# Patient Record
Sex: Male | Born: 1955 | Race: White | Hispanic: No | Marital: Married | Smoking: Never smoker
Health system: Southern US, Community
[De-identification: ages and names within clinical notes are randomized; demographics above are authoritative.]

## PROBLEM LIST (undated history)

## (undated) DIAGNOSIS — IMO0002 Reserved for concepts with insufficient information to code with codable children: Secondary | ICD-10-CM

## (undated) DIAGNOSIS — F329 Major depressive disorder, single episode, unspecified: Secondary | ICD-10-CM

## (undated) DIAGNOSIS — G8929 Other chronic pain: Secondary | ICD-10-CM

## (undated) DIAGNOSIS — F32A Depression, unspecified: Secondary | ICD-10-CM

## (undated) DIAGNOSIS — G4730 Sleep apnea, unspecified: Secondary | ICD-10-CM

## (undated) DIAGNOSIS — E785 Hyperlipidemia, unspecified: Secondary | ICD-10-CM

## (undated) DIAGNOSIS — Z9689 Presence of other specified functional implants: Secondary | ICD-10-CM

## (undated) DIAGNOSIS — H269 Unspecified cataract: Secondary | ICD-10-CM

## (undated) DIAGNOSIS — Z9889 Other specified postprocedural states: Secondary | ICD-10-CM

## (undated) DIAGNOSIS — I10 Essential (primary) hypertension: Secondary | ICD-10-CM

## (undated) DIAGNOSIS — G5603 Carpal tunnel syndrome, bilateral upper limbs: Secondary | ICD-10-CM

## (undated) DIAGNOSIS — K219 Gastro-esophageal reflux disease without esophagitis: Secondary | ICD-10-CM

## (undated) DIAGNOSIS — F419 Anxiety disorder, unspecified: Secondary | ICD-10-CM

## (undated) DIAGNOSIS — M549 Dorsalgia, unspecified: Secondary | ICD-10-CM

## (undated) HISTORY — DX: Gastro-esophageal reflux disease without esophagitis: K21.9

## (undated) HISTORY — DX: Dorsalgia, unspecified: M54.9

## (undated) HISTORY — PX: COLONOSCOPY: SHX174

## (undated) HISTORY — PX: NASAL SEPTUM SURGERY: SHX37

## (undated) HISTORY — PX: KNEE ARTHROSCOPY: SUR90

## (undated) HISTORY — DX: Other chronic pain: G89.29

## (undated) HISTORY — PX: NISSEN FUNDOPLICATION: SHX2091

## (undated) HISTORY — PX: UPPER GASTROINTESTINAL ENDOSCOPY: SHX188

## (undated) HISTORY — DX: Hyperlipidemia, unspecified: E78.5

## (undated) HISTORY — DX: Carpal tunnel syndrome, bilateral upper limbs: G56.03

## (undated) HISTORY — DX: Anxiety disorder, unspecified: F41.9

## (undated) HISTORY — DX: Sleep apnea, unspecified: G47.30

## (undated) HISTORY — PX: ELBOW SURGERY: SHX618

## (undated) HISTORY — DX: Essential (primary) hypertension: I10

## (undated) HISTORY — PX: STOMACH SURGERY: SHX791

## (undated) HISTORY — PX: SKIN CANCER EXCISION: SHX779

## (undated) HISTORY — DX: Depression, unspecified: F32.A

## (undated) HISTORY — DX: Unspecified cataract: H26.9

## (undated) HISTORY — PX: BACK SURGERY: SHX140

## (undated) HISTORY — DX: Reserved for concepts with insufficient information to code with codable children: IMO0002

## (undated) HISTORY — PX: INGUINAL HERNIA REPAIR: SUR1180

## (undated) HISTORY — PX: SPINAL CORD STIMULATOR INSERTION: SHX5378

## (undated) HISTORY — DX: Major depressive disorder, single episode, unspecified: F32.9

## (undated) HISTORY — PX: OTHER SURGICAL HISTORY: SHX169

## (undated) HISTORY — PX: CARPAL TUNNEL RELEASE: SHX101

---

## 1997-10-27 ENCOUNTER — Ambulatory Visit (HOSPITAL_BASED_OUTPATIENT_CLINIC_OR_DEPARTMENT_OTHER): Admission: RE | Admit: 1997-10-27 | Discharge: 1997-10-27 | Payer: Self-pay | Admitting: *Deleted

## 1997-11-06 ENCOUNTER — Ambulatory Visit (HOSPITAL_COMMUNITY): Admission: RE | Admit: 1997-11-06 | Discharge: 1997-11-06 | Payer: Self-pay | Admitting: Specialist

## 1998-03-13 ENCOUNTER — Encounter: Payer: Self-pay | Admitting: Emergency Medicine

## 1998-03-13 ENCOUNTER — Emergency Department (HOSPITAL_COMMUNITY): Admission: EM | Admit: 1998-03-13 | Discharge: 1998-03-13 | Payer: Self-pay | Admitting: Emergency Medicine

## 1998-10-27 ENCOUNTER — Ambulatory Visit (HOSPITAL_COMMUNITY): Admission: RE | Admit: 1998-10-27 | Discharge: 1998-10-27 | Payer: Self-pay | Admitting: Gastroenterology

## 1998-10-29 ENCOUNTER — Ambulatory Visit (HOSPITAL_COMMUNITY): Admission: RE | Admit: 1998-10-29 | Discharge: 1998-10-29 | Payer: Self-pay | Admitting: Gastroenterology

## 1999-03-02 ENCOUNTER — Encounter: Admission: RE | Admit: 1999-03-02 | Discharge: 1999-03-09 | Payer: Self-pay | Admitting: Anesthesiology

## 2001-05-11 ENCOUNTER — Ambulatory Visit (HOSPITAL_COMMUNITY): Admission: RE | Admit: 2001-05-11 | Discharge: 2001-05-11 | Payer: Self-pay | Admitting: Orthopedic Surgery

## 2002-05-08 ENCOUNTER — Emergency Department (HOSPITAL_COMMUNITY): Admission: EM | Admit: 2002-05-08 | Discharge: 2002-05-08 | Payer: Self-pay | Admitting: Emergency Medicine

## 2002-11-21 ENCOUNTER — Ambulatory Visit (HOSPITAL_BASED_OUTPATIENT_CLINIC_OR_DEPARTMENT_OTHER): Admission: RE | Admit: 2002-11-21 | Discharge: 2002-11-21 | Payer: Self-pay | Admitting: Pulmonary Disease

## 2003-02-05 ENCOUNTER — Ambulatory Visit (HOSPITAL_COMMUNITY): Admission: RE | Admit: 2003-02-05 | Discharge: 2003-02-06 | Payer: Self-pay | Admitting: Otolaryngology

## 2003-02-05 ENCOUNTER — Encounter (INDEPENDENT_AMBULATORY_CARE_PROVIDER_SITE_OTHER): Payer: Self-pay | Admitting: *Deleted

## 2004-06-08 ENCOUNTER — Ambulatory Visit: Payer: Self-pay | Admitting: Family Medicine

## 2004-08-02 ENCOUNTER — Ambulatory Visit: Payer: Self-pay | Admitting: Family Medicine

## 2004-09-27 ENCOUNTER — Ambulatory Visit: Payer: Self-pay | Admitting: Pulmonary Disease

## 2004-11-26 ENCOUNTER — Ambulatory Visit (HOSPITAL_BASED_OUTPATIENT_CLINIC_OR_DEPARTMENT_OTHER): Admission: RE | Admit: 2004-11-26 | Discharge: 2004-11-26 | Payer: Self-pay | Admitting: Orthopedic Surgery

## 2004-11-26 ENCOUNTER — Ambulatory Visit (HOSPITAL_COMMUNITY): Admission: RE | Admit: 2004-11-26 | Discharge: 2004-11-26 | Payer: Self-pay | Admitting: Orthopedic Surgery

## 2005-06-27 ENCOUNTER — Encounter: Admission: RE | Admit: 2005-06-27 | Discharge: 2005-07-19 | Payer: Self-pay | Admitting: Orthopedic Surgery

## 2005-09-06 ENCOUNTER — Ambulatory Visit: Payer: Self-pay | Admitting: Cardiology

## 2005-09-06 ENCOUNTER — Observation Stay (HOSPITAL_COMMUNITY): Admission: EM | Admit: 2005-09-06 | Discharge: 2005-09-08 | Payer: Self-pay | Admitting: Emergency Medicine

## 2005-09-20 ENCOUNTER — Ambulatory Visit: Payer: Self-pay | Admitting: Internal Medicine

## 2007-04-02 ENCOUNTER — Ambulatory Visit: Payer: Self-pay | Admitting: Vascular Surgery

## 2007-04-02 ENCOUNTER — Ambulatory Visit: Admission: RE | Admit: 2007-04-02 | Discharge: 2007-04-02 | Payer: Self-pay | Admitting: Family Medicine

## 2007-06-04 ENCOUNTER — Emergency Department (HOSPITAL_COMMUNITY): Admission: EM | Admit: 2007-06-04 | Discharge: 2007-06-04 | Payer: Self-pay | Admitting: Emergency Medicine

## 2008-05-24 ENCOUNTER — Emergency Department (HOSPITAL_COMMUNITY): Admission: EM | Admit: 2008-05-24 | Discharge: 2008-05-24 | Payer: Self-pay | Admitting: Emergency Medicine

## 2008-05-28 ENCOUNTER — Ambulatory Visit: Payer: Self-pay | Admitting: Gastroenterology

## 2008-06-10 ENCOUNTER — Ambulatory Visit: Payer: Self-pay | Admitting: Gastroenterology

## 2009-11-26 ENCOUNTER — Emergency Department (HOSPITAL_COMMUNITY): Admission: EM | Admit: 2009-11-26 | Discharge: 2009-11-26 | Payer: Self-pay | Admitting: Emergency Medicine

## 2010-04-11 ENCOUNTER — Encounter: Payer: Self-pay | Admitting: Family Medicine

## 2010-08-03 NOTE — Op Note (Signed)
NAMEMALICHI, PALARDY NO.:  [id number]   MEDICAL RECORD NO.:  [id number]          PATIENT TYPE:  EMS   LOCATION:  MAJO                         FACILITY:  [facility]   PHYSICIAN:  Madelynn Done, MD  DATE OF BIRTH:  November 23, 1955   DATE OF PROCEDURE:  06/04/2007  DATE OF DISCHARGE:  06/04/2007                               OPERATIVE REPORT   PREOPERATIVE DIAGNOSIS:  Right long finger table saw injury with open  distal phalanx and exposed bone.   POSTOPERATIVE DIAGNOSIS:  Right long finger table saw injury with open  distal phalanx and exposed bone.   ATTENDING SURGEON:  Dr. Bradly Bienenstock, who was scrubbed and present for  the entire procedure.   ASSISTANT SURGEON:  None.   PROCEDURE:  1. Debridement of skin, subcutaneous tissue and bone associated with      open fracture right long finger.  2. Rotational flap advancement and closure of an open soft tissue      defect, right long finger less than 2 cm.   ANESTHESIA:  General via LMA.   TOURNIQUET TIME:  20 minutes at 250 mmHg.   SURGICAL INDICATIONS:  Mr. Streety is a 55 year old right-hand-dominant  gentleman who was working on a home table saw when he got his right long  finger cut by the blade.  The patient sustained a traumatic amputation  of the radial border of his right long finger with soft tissue loss.  This injury went through the bone and the patient presented to the ER  with exposed distal phalanx.  After the patient was seen and evaluated  in emergency department, it was recommended that he undergo the above  procedure.  Risks, benefits and alternatives were discussed in detail  with the patient and signed informed consent was obtained.   DESCRIPTION OF PROCEDURE:  The patient was properly identified in preop  holding area and a mark with a permanent marker was made on the right  long finger to indicate the correct operative site.  The patient was  then brought back to the operating room, placed  supine on the anesthesia  room table.  General anesthesia was administered via LMA.  He received  preoperative antibiotics prior to any skin incisions.  A well-padded  tourniquet was then placed on the right forearm and sealed with a 1000  drape.  The right upper extremity was then prepped with Betadine and  sterilely draped.  A time-out was called, the correct side was  identified and procedure was then begun.  After appropriate Esmarch  exsanguination, tourniquet insufflated to 250 mmHg.  After the  debridement of the skin and subcutaneous tissues as well as the bone  were then carried out.  The exposed distal phalanx did not have any soft  tissue coverage was then carried out with scissors as well as rongeur.  Following aggressive debridement of the distal soft tissues and bone,  the rotational flap was then designed in order to rotate the flap to  cover the radial soft tissue defect.  The rotational flap was to cover  the 2 cm defect.  A further V-Y advancement flap was then created along  the ulnar side in order to allow for appropriate wound closure.  After  the advancement flaps were then created, they were loosely tacked back  in their appropriate position.  They were then finally sewed in place  with 5-0 nylon.  The rotational defect closing over the bone was then  sewed through drill holes through the distal phalanx with 5-0 nylon  suture.  These were two drill holes made with a 0.045 K-wire.  The  remaining soft tissue coverage was closed with directly soft tissue to  soft tissue with 5-0 nylon suture.  The tourniquet was deflated.  There  was good perfusion of the fingertip following closure.  Quarter percent  Marcaine local and 1% lidocaine 5 mL mixture were then applied as the  flexor tendon sheath block.  Adaptic and a sterile compressive dressing  were then applied to the digit.  The patient was then placed in the  finger splint and a Coban dressing.  He was then extubated  and taken to  recovery room good condition.   POSTOPERATIVE PLAN:  The patient be discharged to home and will be seen  back in the office in eight days for wound check and likely tip  protector splint.  The patient was explained preoperatively that if the  soft tissues do not heal, the patient may require further intervention.  Continue to follow his as his wound progresses.      Madelynn Done, MD  Electronically Signed     FWO/MEDQ  D:  06/04/2007  T:  06/05/2007  Job:  [id number]

## 2010-08-06 NOTE — Op Note (Signed)
[name], Jeff Stewart              ACCOUNT NO.:  [id number]   MEDICAL RECORD NO.:  [id number]          PATIENT TYPE:  AMB   LOCATION:  NESC                         FACILITY:  [facility]   PHYSICIAN:  Marlowe Kays, M.D.  DATE OF BIRTH:  1955/12/11   DATE OF PROCEDURE:  11/26/2004  DATE OF DISCHARGE:                                 OPERATIVE REPORT   PREOPERATIVE DIAGNOSIS:  1.  Left carpal tunnel syndrome.  2.  Status post right carpal tunnel release.   POSTOPERATIVE DIAGNOSIS:  1.  Left carpal tunnel syndrome.  2.  Status post right carpal tunnel release.   OPERATION:  Decompression median nerve left wrist and hand.   SURGEON:  Marlowe Kays, M.D.   ASSISTANT:  Nurse.   ANESTHESIA:  General.   INDICATIONS FOR PROCEDURE:  He has had an excellent result.  He has  documented carpal tunnel with nerve conduction studies and is here today for  the above-mentioned surgery.   DESCRIPTION OF PROCEDURE:  Satisfactory forearm to fingertips was draped in  a sterile field.  I marked out a curved incision on base of the thenar  eminence crossing obliquely over the flexor crease of wrist in the distal  forearm.  Palmaris longus tendon was identified at the wrist and the  overlying skin, subcutaneous tissue had very thick fascia particularly in  the mid palm or progressively released until the median nerve and its  branches were released in the distal palm.  The wound was then irrigated  with sterile saline and skin, subcutaneous tissue only  closed with interrupted 4-0 nylon mattress sutures.  Betadine, Adaptic and  dry sterile dressing, volar plaster splint were applied.  Tourniquet was  released.  At the time of this dictation, he was on his way to recovery in  satisfactory condition with no known complications.           ______________________________  Marlowe Kays, M.D.     JA/MEDQ  D:  11/26/2004  T:  11/26/2004  Job:  [id number]   cc:   Pecolia Ades  [address]  [place]  [place] [address]  Fax: [phone]

## 2010-08-06 NOTE — H&P (Signed)
[name], Stewart NO.:  [id number]   MEDICAL RECORD NO.:  [id number]          PATIENT TYPE:  INP   LOCATION:  1843                         FACILITY:  [facility]   PHYSICIAN:  Rollene Rotunda, M.D.   DATE OF BIRTH:  Jul 29, 1955   DATE OF ADMISSION:  09/06/2005  DATE OF DISCHARGE:                                HISTORY & PHYSICAL   PRIMARY CARE PHYSICIAN:  None.   REASON FOR PRESENTATION:  Patient with chest pain.   HISTORY OF PRESENT ILLNESS:  The patient is a pleasant 55 year old gentleman  with no prior cardiac history. He does have chest pain. He says that this  has been going on sporadically for about 5 years. However, it has been  increasing in intensity and frequency. Yesterday, he had a severe episode.  He did not present to the hospital. He was driving home from a pain clinic  evaluation. He developed severe chest discomfort. He completed his drive  home and went to bed. It took him about 45 minutes to get home. He was able  eventually to fall asleep. When he awoke, he no longer had chest discomfort  but he has been fatigued today. He described the discomfort as under his  left chest and sternum. It was an aching. It radiated to his jaw and down  into his left arm. It was similar to previous episodes but not similar to  previous reflux. It was more intense. This morning, when he started to feel  the nausea that accompanied this coming on again, he presented to [facility] and was sent to the ER. He was not found to have acute EKG changes. As  mentioned, he did have nausea with this episode and diaphoresis. He is  fairly inactive because of chronic back pain from injuries. He does do some  stairs in his house down to the basement. He says he cannot bring on this  pain but he gets short of breath climbing the stairs. He denies any PND or  orthopnea, though he has severe sleep apnea. He has no palpitations,  syncope, or pre-syncope.   PAST MEDICAL HISTORY:  1.  Sleep apnea (so severe that he talked about tracheostomy. He sleep with      Bi-PAP.)  2.  Gastroesophageal reflux.  3.  Status post right heel fracture.   PAST SURGICAL HISTORY:  1.  Nissan fundoplication May 2007.  2.  Five lumbar spine surgeries.  3.  Nasal surgery x5.  4.  Carpal tunnel surgery bilateral hands.  5.  Implantation of a pain pump in his abdomen.  6.  Tonsillectomy.  7.  Uvuloplasty.  8.  Right inguinal hernia.  9.  Elbow scraping.  10. Knee scraping.   ALLERGIES:  NO KNOWN DRUG ALLERGIES.   CURRENT MEDICATIONS:  Morphine pump, Zoloft 100 mg daily, Ambien 10 mg  q.h.s., and hydrocodone.   SOCIAL HISTORY:  The patient lives in [place]. He is disabled secondary to  back pain. 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 He is married with 3  children. He has never smoked cigarettes.  FAMILY HISTORY:  Contributory for his brother in his 77's recently having  bypass. His father had heart disease in his 42's and died in his 64's with  myocardial infarction. His mother died in her 73's with myocardial  infarction.   REVIEW OF SYSTEMS:  Positive for constipation, bright red blood per rectum  since his recent surgery, back pain. Otherwise, is stable with negative  review of systems.   PHYSICAL EXAMINATION:  GENERAL: The patient is in no acute distress.  Pleasant affect.  VITAL SIGNS:  Blood pressure 156/92, heart rate 74 and regular, afebrile.  Saturation 96% on room air.  HEENT:  Eyes, unremarkable. Pupils are equal, round, and reactive to light.  Fundi not visualized. Oral mucosa unremarkable.  NECK:  No jugular venous distention. Wave form within normal limits. Carotid  upstroke brisk and symmetric. No bruits, thyromegaly.  LYMPHATICS:  No cervical, axillary, or inguinal adenopathy.  LUNGS:  Clear to auscultation bilaterally.  BACK:  No costovertebral angle tenderness.  CHEST:  Unremarkable.  HEART:  PMI not displaced or sustained. S1 and S2 within normal  limits. No  S3, S4, no murmurs.  ABDOMEN:  Flat, positive bowel sounds, normal frequency and pitch. No  bruits, rebound, guarding. No midline pulsatile mass. No organomegaly.  Status post right lower quadrant implanted pain pump, small healed recent  laparoscopic surgery scars.  SKIN:  No rashes, no nodules.  EXTREMITIES:  2+ pulses throughout. No edema, cyanosis, or clubbing.  NEUROLOGIC:  Oriented to person, place, and time. Cranial nerves 2-12 are  grossly intact. Motor grossly intact.   LABORATORY DATA:  EKG:  Sinus rhythm, rate 64, axis within normal limits,  intervals within normal limits, no acute ST wave changes.   Sodium 140, potassium 4.7, BUN 7, creatinine 0.7. Point of care markers x1  negative.   Chest x-ray, no acute disease.   ASSESSMENT/PLAN:  1.  The patient's chest discomfort is worrisome for unstable angina. It is a      progressive pattern. He has features consistent with coronary artery      disease. He has a strong family history. Given this, his __________      probability is moderately high. I think the preferred test is cardiac      catheterization. I discussed this logic at great length with the patient      and his wife. I have extensively outlined the risks of cardiac      catheterization to include stroke, death, heart attack, contrast      reaction, renal failure, vascular trauma, hematoma, ecchymosis. The      patient understands and agrees to proceed. He will be managed with      heparin, aspirin, and beta blockers. If he has recurrent pain, we will      add nitrates. I will check C-met for liver profile, also amylase and      lipase.  2.  Risk reduction.  Will check a lipid profile and TSH.  3.  Sleep apnea. He has severe apnea and I will write for Bi-PAP, though he      does not know his home settings.  4.  Chronic back pain. He has his morphine. Will write for hydrocodone as      well.          ______________________________  Rollene Rotunda,  M.D.     JH/MEDQ  D:  09/06/2005  T:  09/06/2005  Job:  [id number]

## 2010-08-06 NOTE — Op Note (Signed)
[facility]  Patient:    MARTRELL, EGUIA Visit Number: [id number] MRN: [id number]          Service Type: DSU Location: DAY Attending Physician:  Marlowe Kays Page Dictated by:   Illene Labrador. Aplington, M.D. Proc. Date: 05/11/01 Admit Date:  05/11/2001                             Operative Report  PREOPERATIVE DIAGNOSIS:  Chronic lateral epicondylitis, right elbow.  POSTOPERATIVE DIAGNOSIS:  Chronic lateral epicondylitis, right elbow.  OPERATION PERFORMED:  Repair of common extensor tendon lateral right elbow with partial lateral epicondylectomy.  SURGEON:  Illene Labrador. Aplington, M.D.  ASSISTANT:  Nurse.  ANESTHESIA:  General.  PATHOLOGY AND JUSTIFICATION FOR PROCEDURE:  He has had a long history of chronic lateral elbow pain. He has had several injections without relief of symptoms and no relief with other nonsurgical measures. Consequently he is here for the above mentioned surgery.  DESCRIPTION OF PROCEDURE:  Satisfactory general anesthesia, pneumatic tourniquet, duraprepped from tourniquet to wrist which was draped in a sterile field. A curved incision was made midway between the lateral epicondyle and the olecranon. The thin fascia was split and retracted superiorly and the common extensor tendon identified. Superficially it was very inflamed with hypervascularity. With the cutting cautery, I made a horizontal incision with the wings distally partially stripping the tendon off the lateral epicondyle and then with sharp dissection continued the dissection off the lateral epicondyle. The superior half was detached with joint fluid coming forth and contrast to the inferior half which was normally attached. After debriding up the fibrinous portion of the tendon, I then used a 3/4 inch curved osteotome to remove the superficial portion of the lateral epicondyle so that there was good raw bleeding bone from the attachment of the tendon. I placed  two parallel drill holes through the raw bone exiting proximally and then took a #0 Vicryl suture and went through the inferior two holes, wove it through the extensor tendon and then back up through the superior hole and with the elbow flexed and with tension on the leash, I then repaired the two wings of the U with interrupted #0 Vicryl and finally tying the apical stitch and supplementing it with some additional sutures so that the common extensor tendon and the fascia surrounding it. This gave a nice tight reconstruction. The wound was then irrigated with sterile saline, soft tissue was infiltrated with 0.5% plain Marcaine. The thin fascia was reapproximated with running 3-0 Vicryl and the skin and subcutaneous tissue with interrupted 4-0 nylon mattress sutures. Betadine Adaptic dry sterile dressing and a long arm splint cast were applied. The tourniquet was released, he tolerated the procedure well and at the time of this dictation was on his way to the recovery room in satisfactory condition with no known complications. Dictated by:   Illene Labrador. Aplington, M.D. Attending Physician:  Joaquin Courts DD:  05/11/01 TD:  05/11/01 Job: [id number]

## 2010-08-06 NOTE — Op Note (Signed)
NAME:  Jeff Stewart, Jeff Stewart                        ACCOUNT NO.:  [id number]   MEDICAL RECORD NO.:  [id number]                   PATIENT TYPE:  OIB   LOCATION:  2307                                 FACILITY:  [facility]   PHYSICIAN:  Suzanna Obey, M.D.                    DATE OF BIRTH:  06-03-1955   DATE OF PROCEDURE:  02/05/2003  DATE OF DISCHARGE:                                 OPERATIVE REPORT   PREOPERATIVE DIAGNOSIS:  Obstructive sleep apnea, nasal obstruction.   POSTOPERATIVE DIAGNOSIS:  Obstructive sleep apnea, nasal obstruction.   SURGICAL PROCEDURE:  Uvulopharyngeal palatoplasty and tonsillectomy, left  submucous resection of inferior turbinate, bilateral nasal valve repair.   ANESTHESIA:  General endotracheal tube.   ESTIMATED BLOOD LOSS:  Approximately 10 mL.   INDICATIONS FOR PROCEDURE:  This is a 55 year old who has had a chronic  problem with nasal obstruction and this has been attempted to be repaired  multiple times.  He has had septoplasty and turbinate reduction.  He still  has significant left turbinate hypertrophy and has definite nasal valves  bilaterally.  He also has obstructive sleep apnea necessitating  uvulopharyngeal palatoplasty and tonsillectomy.  He was informed the risks  and benefits of the procedure including bleeding, infection, pharyngeal  insufficiency, change in the voice, chronic crusting and drying of the nose,  scars on his face and deformity of the external appearance of the nose from  his valve surgery, extrusion of the sutures, chronic pain, and risk of the  anesthetic.  All questions were answered and consent was obtained.   OPERATION:  The patient was taken to the operating room and placed in the  supine position.  After adequate general endotracheal tube anesthesia, was  placed in a supine position, prepped and draped in the usual sterile  fashion.  The nasal exam was performed and oxymetazoline was placed and the  inferior turbinate was  injected with 1% lidocaine with 1:100,000  epinephrine.  The midline incision was made with a 15 blade and mucosal flap  elevated superiorly.  The inferior mucosa and bone were removed with the  turbinate scissors.  The edge was cauterized with suction cautery.  The  turbinate was out fractured and the oral cavity and oropharynx was then  addressed.  A McIvor mouth gag was positioned and the palate was cut with  electrocautery just above the base of the uvula into the tonsillar fossa.  The left tonsil was removed with electrocautery dissection along its  capsule.  The dissection was carried across the palate into the right  tonsillar fossa and it was removed, as well.  The uvula, palate, and both  tonsils were removed en bloc.  The fossae were cauterized with suction  cautery.  The palate was then closed with interrupted 3-0 Vicryl.   The nasal surgery was then begun.  Two incisions were made after prep and  drape just  below the orbital region in a skin crease.  This was right along  the orbital rim.  Dissection was carried down to the orbital rim exposing  the bone.  Both nasal valves were then checked with the pickups to see  exactly where a suture needed to be placed to bring the upper lateral  cartilages laterally.  A large curved needle was placed along the periosteum  and dissected up into the nasal valve region.  The suture was then replaced  back from the nasal region back up into the incision of the infraorbital  rim.  This looped around the lower lateral cartilages.  A drill was then  used to make drill hole right at the  edge of the orbital rim and a suture  was passed through this and looped around the bone.  The suture was then  tightened by tying it until the nasal valve was nicely laterally displaced  and opened up the nose nicely.  This was done bilaterally.  This was a 2-0  Prolene that was used.  Both sides were tied tightly and the wound was  irrigated.  Both nasal  valves were completely laterally displaced at that  point.  The wound was then closed with interrupted 4-0 chromic and a 5-0  nylon suture for the skin.  The Telfa roll soaked in Bacitracin was placed  into the left side and secured with a 2-0 Prolene.  The oral cavity and  oropharynx were suctioned out of all blood and debris.  The hypopharynx,  esophagus, and stomach were suctioned with an NG tube.  The patient was  awakened and brought to the recovery room in stable condition.  Counts were  correct.                                               Suzanna Obey, M.D.    Cordelia Pen  D:  02/05/2003  T:  02/05/2003  Job:  [id number]   cc:   Colon Flattery, MD  [address]  [place]  [place] [address]  Fax: [phone]

## 2010-08-06 NOTE — Discharge Summary (Signed)
Jeff Stewart, Jeff Stewart NO.:  [id number]   MEDICAL RECORD NO.:  [id number]          PATIENT TYPE:  INP   LOCATION:  2009                         FACILITY:  [facility]   PHYSICIAN:  Jeff Stewart, P.A.     DATE OF BIRTH:  [birth year]   DATE OF ADMISSION:  09/06/2005  DATE OF DISCHARGE:                                 DISCHARGE SUMMARY   PRIMARY CARE PHYSICIAN:  The patient is in the process of establishing himself with Jeff Stewart.   CARDIOLOGIST:  The patient is new to Dr. Antoine Stewart.   REASON FOR ADMISSION:  Chest pain.   DISCHARGE DIAGNOSES:  1.  Noncardiac chest pain.  2.  Normal coronary arteries by catheterization this admission.  3.  Good left ventricular function.  4.  Severe sleep apnea, on home BiPAP.  5.  Gastroesophageal reflux disease.      1.  Status post Nissen fundoplication May 2007.  6.  Status post multiple surgeries in the past.      1.  Five lumbar spine surgeries.      2.  Nasal surgery times five.      3.  Carpal tunnel surgery, bilateral hands.      4.  Morphine pump implantation in his abdomen.      5.  Tonsillectomy.      6.  Uvuloplasty.      7.  Right inguinal herniorrhaphy.      8.  Elbow scraping.          1.  Knee scraping.  7.  Family history of coronary artery disease.   PROCEDURES PERFORMED THIS ADMISSION:  Cardiac catheterization by Dr. Charlies Stewart revealing irregularities in the LAD.  The circumflex and RCA were  okay.  His ejection fraction was 60%.   HISTORY:  [name] is a 55 year old male patient with no previously known  coronary artery disease who presented to the Emergency Room  after being  referred from Urgent Care with chest pain.  He recently had been seen at the  [facility] and was driving home from there.  He developed chest pain,  nausea, and diaphoresis.  He did not go to the Emergency Room.  The next day  he started getting nauseated again and went to urgent care and was sent to  the Emergency Room.   He has a long history of chest discomfort that dates  back probably 7 to 8 years.  It has been more frequent over the last 4 to 5  months.  He was admitted for further evaluation.   HOSPITAL COURSE:  The patient ruled out for myocardial infarction by  enzymes.  It was felt that he should undergo cardiac catheterization to  further define his symptoms.  The patient was taken for cardiac  catheterization on September 07, 2005 by Dr. Juanda Stewart.  The results are noted  above.  The etiology of the symptoms were unclear.  Dr. Juanda Stewart felt that the  patient should consider risk factor modification with aspirin.  He would  need a lipid profile drawn as an outpatient.  Dr. Antoine Stewart  saw the patient  on September 08, 2005.  The gallbladder ultrasound is still pending at this time  to further evaluate his chest pain.  The patient is felt to be stable enough  for discharge to home.  Will set him up for a two week appointment in our  office to do a post cath check.  The patient is establishing with Jeff Stewart and he should follow up with her further for risk factor modification  and management of his other medical problems.   At the time of this dictation, the results of the abdominal ultrasound came  back.  This showed no acute findings.  There was some diffuse increased  echogenicity of the liver apparent, suggesting fatty infiltration.  As noted  above, the patient will need outpatient lipid panel evaluation.  This can be  done per his new primary care physician, Jeff Stewart.   LABS AND ANCILLARY DATA:  White count 5,300, hemoglobin 12.6, hematocrit  38.3, platelet count 155,000.  INR is 0.9, sodium 141, potassium 4.1, BUN 5,  creatinine 0.8, total bilirubin 0.7, alkaline phosphatase 93, AST 21, ALT  23, total protein 6.5, albumin 3.7, calcium 9.2.  Cardiac enzymes negative  times three.  Chest x-ray on admission, no acute disease.  Abdominal  ultrasound showed no acute findings.  Diffuse increased  echogenicity of the  liver apparent, suggestive of fatty infiltration.   DISCHARGE MEDICATIONS:  1.  Aspirin 81 mg daily.  2.  The patient has been asked to resume his previously taken home      medications including Morphine pump, Zoloft 100 mg daily, Ambien 10 mg      q.h.s., and Hydrocodone as needed.   ACTIVITY:  No driving, heavy lifting, or sexual activity for three days.  He  is to increase his activities slowly.  He may shower.  He may walk up steps.   DIET:  Low fat, low sodium.   WOUND CARE:  The patient should call our office if there is any gross  bleeding, bruising or fever.   FOLLOWUP:  Follow up will be with the physician assistant for Dr. Antoine Stewart  on July 3 at 1 p.m. He should follow up with Dr. Yehuda Stewart as scheduled.   Total physician PA time greater than 30 minutes on this patient.      Jeff Stewart, P.A.     SW/MEDQ  D:  09/08/2005  T:  09/08/2005  Job:  [id number]   cc:   Jeff Stewart, M.D.  Fax: [phone]

## 2010-08-06 NOTE — Cardiovascular Report (Signed)
NAME:  PALMER, FAHRNER NO.:  [id number]   MEDICAL RECORD NO.:  [id number]           PATIENT TYPE:   LOCATION:                                 FACILITY:   PHYSICIAN:  Charlies Constable, M.D. [facility] DATE OF BIRTH:  [birth year]   DATE OF PROCEDURE:  09/07/2005  DATE OF DISCHARGE:                              CARDIAC CATHETERIZATION   CLINICAL HISTORY:  [name] is 55 years old and has a very strong positive  family history for coronary heart disease and borderline hypertension.  He  was admitted to the hospital with symptoms of nausea and diaphoresis and  seen by Dr. Antoine Poche and felt to possibly have unstable angina.  He was  scheduled for evaluation by angiography today.  He also is disabled because  of low back pain after multiple back surgeries and has a morphine pump.  He  also is status post Nissen fundoplication in May 2007.  The symptoms of  nausea and diaphoresis have been present for a couple of years.   PROCEDURE:  The procedure was performed via the right femoral artery using  an arterial sheath and 6 French preformed coronary catheters.  A femoral  arterial punch was performed and Omnipaque contrast was used.  A distal  aortogram was performed to rule out abdominal aortic aneurysm.  The right  femoral artery was closed with Angio-Seal at the end of the procedure.  The  patient tolerated the procedure well and left the laboratory in satisfactory  condition.   RESULTS:  The aortic pressure was 119/82 with a mean of 100.  Left  ventricular pressure was 119/6.   The Left Main Coronary Artery:  The left main coronary artery was free of  significant disease.   The Left Anterior Descending Artery:  The left anterior descending artery  gave rise to 3 septal perforators and a moderately large diagonal branch.  The LAD had mild irregularities but no significant obstruction.   The Circumflex Artery:  The circumflex artery gave rise to 2 marginal  branches and 2  posterolateral branches.  These vessels were free of  significant disease.   The Right Coronary Artery:  The right coronary artery was a moderate sized  vessel that gave rise to a conus branch, a right ventricular branch,  posterior descending branch and 4 posterolateral branches.  These vessels  were free of significant disease.   The Left Ventriculogram:  The left ventriculogram performed in the RAO  projection showed good wall motion with no areas of hypokinesis.  The  estimated ejection fraction was 60%.   A Distal Aortogram:  A distal aortogram was performed and showed patent  renal arteries and no significant aortoiliac obstruction.   CONCLUSION:  Minimal luminal irregularities with no significant obstructive  coronary artery disease and normal left ventricular function.   RECOMMENDATIONS:  Reassurance.   In view of these findings, I do not think the patient's symptoms are related  to his heart.  We will plan to get an ultrasound of the gallbladder and  pancreas, hopefully, today and discharge him later this evening.  He has  seen  Dr.  Dewaine Conger and Dr. Lysbeth Galas in the past and we will arrange for him to see Dr.  Lysbeth Galas in followup and he can decide about further evaluation.  We will  recommend aspirin if he can tolerate that and we will consider lipid therapy  after we get the results of his lipid profile.           ______________________________  Charlies Constable, M.D. [facility]     BB/MEDQ  D:  09/07/2005  T:  09/07/2005  Job:  [id number]   cc:   Delaney Meigs, M.D.  Fax: [phone]   Rollene Rotunda, M.D.  [address]  [address]  [place]  [place] [address]   Charlies Constable, M.D. [facility]  [address]  [address]  [place]  [place] [address]

## 2012-09-23 ENCOUNTER — Other Ambulatory Visit: Payer: Self-pay | Admitting: Orthopaedic Surgery

## 2012-09-23 DIAGNOSIS — M542 Cervicalgia: Secondary | ICD-10-CM

## 2012-09-23 DIAGNOSIS — M549 Dorsalgia, unspecified: Secondary | ICD-10-CM

## 2012-10-01 ENCOUNTER — Ambulatory Visit
Admission: RE | Admit: 2012-10-01 | Discharge: 2012-10-01 | Disposition: A | Discharge status: Home / Self Care | Payer: Medicare Other | Source: Ambulatory Visit | Attending: Orthopaedic Surgery | Admitting: Orthopaedic Surgery

## 2012-10-01 VITALS — BP 145/88 | HR 46 | Ht 73.0 in | Wt 246.0 lb

## 2012-10-01 DIAGNOSIS — M542 Cervicalgia: Secondary | ICD-10-CM

## 2012-10-01 DIAGNOSIS — M549 Dorsalgia, unspecified: Secondary | ICD-10-CM

## 2012-10-01 MED ORDER — OXYCODONE-ACETAMINOPHEN 5-325 MG PO TABS
2.0000 | ORAL_TABLET | Freq: Once | ORAL | Status: AC
  Administered 2012-10-01: 2 via ORAL

## 2012-10-01 MED ORDER — IOHEXOL 300 MG/ML  SOLN
10.0000 mL | Freq: Once | INTRAMUSCULAR | Status: AC | PRN
  Administered 2012-10-01: 10 mL via INTRATHECAL

## 2012-10-01 MED ORDER — DIAZEPAM 5 MG PO TABS
10.0000 mg | ORAL_TABLET | Freq: Once | ORAL | Status: AC
  Administered 2012-10-01: 10 mg via ORAL

## 2012-10-01 NOTE — Progress Notes (Signed)
Pt states he has been off zoloft for the past 2 days. Discharge instructions explained to pt.

## 2012-10-29 DIAGNOSIS — M5410 Radiculopathy, site unspecified: Secondary | ICD-10-CM | POA: Insufficient documentation

## 2013-04-15 DIAGNOSIS — Z9109 Other allergy status, other than to drugs and biological substances: Secondary | ICD-10-CM | POA: Diagnosis not present

## 2013-04-15 DIAGNOSIS — M79609 Pain in unspecified limb: Secondary | ICD-10-CM | POA: Diagnosis not present

## 2013-04-15 DIAGNOSIS — R42 Dizziness and giddiness: Secondary | ICD-10-CM | POA: Diagnosis not present

## 2013-04-15 DIAGNOSIS — J329 Chronic sinusitis, unspecified: Secondary | ICD-10-CM | POA: Diagnosis not present

## 2013-04-19 DIAGNOSIS — G4733 Obstructive sleep apnea (adult) (pediatric): Secondary | ICD-10-CM | POA: Diagnosis not present

## 2013-05-10 DIAGNOSIS — IMO0002 Reserved for concepts with insufficient information to code with codable children: Secondary | ICD-10-CM | POA: Diagnosis not present

## 2013-05-10 DIAGNOSIS — M5137 Other intervertebral disc degeneration, lumbosacral region: Secondary | ICD-10-CM | POA: Diagnosis not present

## 2013-05-10 DIAGNOSIS — Z5181 Encounter for therapeutic drug level monitoring: Secondary | ICD-10-CM | POA: Diagnosis not present

## 2013-05-10 DIAGNOSIS — Z79899 Other long term (current) drug therapy: Secondary | ICD-10-CM | POA: Diagnosis not present

## 2013-05-10 DIAGNOSIS — G894 Chronic pain syndrome: Secondary | ICD-10-CM | POA: Diagnosis not present

## 2013-05-10 DIAGNOSIS — M961 Postlaminectomy syndrome, not elsewhere classified: Secondary | ICD-10-CM | POA: Diagnosis not present

## 2013-05-20 DIAGNOSIS — R0602 Shortness of breath: Secondary | ICD-10-CM | POA: Diagnosis not present

## 2013-05-20 DIAGNOSIS — R079 Chest pain, unspecified: Secondary | ICD-10-CM | POA: Diagnosis not present

## 2013-05-20 DIAGNOSIS — R110 Nausea: Secondary | ICD-10-CM | POA: Diagnosis not present

## 2013-05-20 DIAGNOSIS — Z9109 Other allergy status, other than to drugs and biological substances: Secondary | ICD-10-CM | POA: Diagnosis not present

## 2013-05-20 DIAGNOSIS — R42 Dizziness and giddiness: Secondary | ICD-10-CM | POA: Insufficient documentation

## 2013-05-20 DIAGNOSIS — M542 Cervicalgia: Secondary | ICD-10-CM | POA: Diagnosis not present

## 2013-05-21 DIAGNOSIS — M542 Cervicalgia: Secondary | ICD-10-CM | POA: Diagnosis not present

## 2013-05-21 DIAGNOSIS — M4802 Spinal stenosis, cervical region: Secondary | ICD-10-CM | POA: Diagnosis not present

## 2013-06-10 DIAGNOSIS — H903 Sensorineural hearing loss, bilateral: Secondary | ICD-10-CM | POA: Diagnosis not present

## 2013-06-10 DIAGNOSIS — H905 Unspecified sensorineural hearing loss: Secondary | ICD-10-CM | POA: Diagnosis not present

## 2013-06-10 DIAGNOSIS — R42 Dizziness and giddiness: Secondary | ICD-10-CM | POA: Diagnosis not present

## 2013-06-14 ENCOUNTER — Encounter: Payer: Self-pay | Admitting: Cardiology

## 2013-06-14 ENCOUNTER — Ambulatory Visit (INDEPENDENT_AMBULATORY_CARE_PROVIDER_SITE_OTHER): Payer: Medicare Other | Admitting: Cardiology

## 2013-06-14 VITALS — BP 140/92 | HR 57 | Ht 73.0 in | Wt 247.9 lb

## 2013-06-14 DIAGNOSIS — R079 Chest pain, unspecified: Secondary | ICD-10-CM

## 2013-06-14 DIAGNOSIS — R0789 Other chest pain: Secondary | ICD-10-CM | POA: Insufficient documentation

## 2013-06-14 NOTE — Patient Instructions (Signed)
The current medical regimen is effective;  continue present plan and medications.  Your physician has requested that you have an exercise tolerance test. For further information please visit [email or web address]. Please also follow instruction sheet, as given.  Please return fasting for lab work (lipid panel).  Further follow up will be based on these results.

## 2013-06-14 NOTE — Progress Notes (Signed)
HPI The patient presents for evaluation of neck and arm pain as well as dyspnea. He has no prior cardiac history. He has been having increasing shortness of breath over a few months. He said he did have his 6 back surgery recently. He's been doing exercises in the pool to try to recover. He notices it with activity such as bending over to get dizzy. He'll have some shortness of breath when he gets back up. He's having increasing shortness of breath with activities such as climbing the stairs. He does have some discomfort in his neck although he's not sure if this is related to cervical disc disease. He feels some discomfort going down into his left arm with this. This happens with certain movements or motions. He doesn't describe any resting shortness of breath, PND or orthopnea. He's not had any palpitations, presyncope or syncope. He has had some weight gain. He was having some swelling but this is not from a longer period  Allergies  Allergen Reactions  . Nubain [Nalbuphine Hcl] Rash    Current Outpatient Prescriptions  Medication Sig Dispense Refill  . methocarbamol (ROBAXIN) 750 MG tablet Take 750 mg by mouth as needed.      Marland Kitchen morphine (MSIR) 30 MG tablet Take 30 mg by mouth at bedtime as needed.      . sertraline (ZOLOFT) 100 MG tablet Take 100 mg by mouth daily.       No current facility-administered medications for this visit.    Past Medical History  Diagnosis Date  . Hypertension     borderline  . DDD (degenerative disc disease)     neck, lumbar  . Dyslipidemia     diet controlled    Past Surgical History  Procedure Laterality Date  . Back surgery      x 6  . Nasal septum surgery      x 6  . Carpal tunnel release      bilateral  . Elbow surgery    . Knee arthroscopy      Family History  Problem Relation Age of Onset  . CAD Father 65  . Emphysema Father   . Stroke Mother 44  . CAD Brother 26    CABG  . CAD Brother     History   Social History  .  Marital Status: Married    Spouse Name: N/A    Number of Children: 3  . Years of Education: N/A   Occupational History  . 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 Smoking status: Never Smoker   . Smokeless tobacco: Never Used  . Alcohol Use: Not on file  . Drug Use: Not on file  . Sexual Activity: Not on file   Other Topics Concern  . Not on file   Social History Narrative   Lives with wife     ROS:  Positive for nausea.  Otherwise as stated in the HPI and negative for all other systems.  PHYSICAL EXAM BP 140/92  Pulse 57  Ht 6\' 1"  (1.854 m)  Wt 247 lb 14.4 oz (112.447 kg)  BMI 32.71 kg/m2 GENERAL:  Well appearing HEENT:  Pupils equal round and reactive, fundi not visualized, oral mucosa unremarkable NECK:  No jugular venous distention, waveform within normal limits, carotid upstroke brisk and symmetric, no bruits, no thyromegaly LYMPHATICS:  No cervical, inguinal adenopathy LUNGS:  Clear to auscultation bilaterally BACK:  No CVA tenderness CHEST:  Unremarkable HEART:  PMI not displaced or sustained,S1 and S2 within normal limits, no S3, no S4, no clicks, no rubs, no murmurs ABD:  Flat, positive bowel sounds normal in frequency in pitch, no bruits, no rebound, no guarding, no midline pulsatile mass, no hepatomegaly, no splenomegaly, morphine pump EXT:  2 plus pulses throughout, no edema, no cyanosis no clubbing SKIN:  No rashes no nodules NEURO:  Cranial nerves II through XII grossly intact, motor grossly intact throughout PSYCH:  Cognitively intact, oriented to person place and time   EKG:  Sinus rhythm, rate 57, leftward axis, intervals within normal limits, no acute ST-T wave changes. 06/14/2013  ASSESSMENT AND PLAN  NECK/SHOULDER PAIN:  I think that the pretest probability of CAD is low/low moderate.  I will bring the patient back for a POET (Plain Old Exercise Test). This will allow me to screen for obstructive coronary disease, risk stratify  and very importantly provide a prescription for exercise.  DYSPNEA:  I think that this might be multifactorial.  However, I will screen as above.  No further cardiac workup would be indicated with a normal test.  However, if he has a submaximal test I would suggest a Lexiscan Myoview.  RISK REDUCTION:  I will check a lipid profile.

## 2013-06-27 ENCOUNTER — Telehealth (HOSPITAL_COMMUNITY): Payer: Self-pay

## 2013-07-01 ENCOUNTER — Other Ambulatory Visit: Payer: Self-pay | Admitting: Cardiology

## 2013-07-01 DIAGNOSIS — R079 Chest pain, unspecified: Secondary | ICD-10-CM | POA: Diagnosis not present

## 2013-07-01 DIAGNOSIS — Z13220 Encounter for screening for lipoid disorders: Secondary | ICD-10-CM | POA: Diagnosis not present

## 2013-07-01 LAB — LIPID PANEL
Cholesterol: 171 mg/dL (ref 0–200)
HDL: 57 mg/dL (ref >39)
LDL Cholesterol: 98 mg/dL (ref 0–99)
TRIGLYCERIDES: 78 mg/dL (ref <150)
Total CHOL/HDL Ratio: 3 Ratio
VLDL: 16 mg/dL (ref 0–40)

## 2013-07-02 ENCOUNTER — Ambulatory Visit (HOSPITAL_COMMUNITY)
Admission: RE | Admit: 2013-07-02 | Discharge: 2013-07-02 | Disposition: A | Discharge status: Home / Self Care | Payer: Medicare Other | Source: Ambulatory Visit | Attending: Cardiovascular Disease | Admitting: Cardiovascular Disease

## 2013-07-02 DIAGNOSIS — R079 Chest pain, unspecified: Secondary | ICD-10-CM | POA: Diagnosis not present

## 2013-07-03 ENCOUNTER — Encounter (HOSPITAL_COMMUNITY): Payer: Medicare Other

## 2013-07-03 DIAGNOSIS — M4802 Spinal stenosis, cervical region: Secondary | ICD-10-CM | POA: Diagnosis not present

## 2013-07-03 DIAGNOSIS — M542 Cervicalgia: Secondary | ICD-10-CM | POA: Diagnosis not present

## 2013-07-08 ENCOUNTER — Telehealth: Payer: Self-pay | Admitting: *Deleted

## 2013-07-08 NOTE — Telephone Encounter (Signed)
Negative ETT. No change in therapy. No further work up. Call Mr. Kling with the results ----- Message ----- From: Kendra Opitz. Moffitt Sent: 07/03/2013 7:32 AM To: Minus Breeding, MD   Pt is aware of results

## 2013-07-11 DIAGNOSIS — J3489 Other specified disorders of nose and nasal sinuses: Secondary | ICD-10-CM | POA: Diagnosis not present

## 2013-07-26 DIAGNOSIS — M5137 Other intervertebral disc degeneration, lumbosacral region: Secondary | ICD-10-CM | POA: Diagnosis not present

## 2013-07-26 DIAGNOSIS — Z5181 Encounter for therapeutic drug level monitoring: Secondary | ICD-10-CM | POA: Diagnosis not present

## 2013-07-26 DIAGNOSIS — Z79899 Other long term (current) drug therapy: Secondary | ICD-10-CM | POA: Diagnosis not present

## 2013-07-26 DIAGNOSIS — M961 Postlaminectomy syndrome, not elsewhere classified: Secondary | ICD-10-CM | POA: Diagnosis not present

## 2013-07-26 DIAGNOSIS — G894 Chronic pain syndrome: Secondary | ICD-10-CM | POA: Diagnosis not present

## 2013-07-26 DIAGNOSIS — IMO0002 Reserved for concepts with insufficient information to code with codable children: Secondary | ICD-10-CM | POA: Diagnosis not present

## 2013-09-19 NOTE — Telephone Encounter (Signed)
Encounter complete. 

## 2013-10-09 DIAGNOSIS — Z79899 Other long term (current) drug therapy: Secondary | ICD-10-CM | POA: Diagnosis not present

## 2013-10-09 DIAGNOSIS — IMO0002 Reserved for concepts with insufficient information to code with codable children: Secondary | ICD-10-CM | POA: Diagnosis not present

## 2013-10-09 DIAGNOSIS — G894 Chronic pain syndrome: Secondary | ICD-10-CM | POA: Diagnosis not present

## 2013-10-09 DIAGNOSIS — M5137 Other intervertebral disc degeneration, lumbosacral region: Secondary | ICD-10-CM | POA: Diagnosis not present

## 2013-10-09 DIAGNOSIS — Z5181 Encounter for therapeutic drug level monitoring: Secondary | ICD-10-CM | POA: Diagnosis not present

## 2013-10-09 DIAGNOSIS — M961 Postlaminectomy syndrome, not elsewhere classified: Secondary | ICD-10-CM | POA: Diagnosis not present

## 2013-10-21 DIAGNOSIS — R5381 Other malaise: Secondary | ICD-10-CM | POA: Diagnosis not present

## 2013-10-21 DIAGNOSIS — R5383 Other fatigue: Secondary | ICD-10-CM | POA: Insufficient documentation

## 2013-11-20 DIAGNOSIS — R5383 Other fatigue: Secondary | ICD-10-CM | POA: Diagnosis not present

## 2013-11-20 DIAGNOSIS — R110 Nausea: Secondary | ICD-10-CM | POA: Diagnosis not present

## 2013-11-20 DIAGNOSIS — T675XXA Heat exhaustion, unspecified, initial encounter: Secondary | ICD-10-CM | POA: Diagnosis not present

## 2013-11-20 DIAGNOSIS — R404 Transient alteration of awareness: Secondary | ICD-10-CM | POA: Diagnosis not present

## 2013-11-20 DIAGNOSIS — X30XXXA Exposure to excessive natural heat, initial encounter: Secondary | ICD-10-CM | POA: Diagnosis not present

## 2013-11-20 DIAGNOSIS — R5381 Other malaise: Secondary | ICD-10-CM | POA: Diagnosis not present

## 2013-11-20 DIAGNOSIS — I10 Essential (primary) hypertension: Secondary | ICD-10-CM | POA: Diagnosis not present

## 2013-11-20 DIAGNOSIS — Z79899 Other long term (current) drug therapy: Secondary | ICD-10-CM | POA: Diagnosis not present

## 2013-12-20 DIAGNOSIS — Z79899 Other long term (current) drug therapy: Secondary | ICD-10-CM | POA: Diagnosis not present

## 2013-12-20 DIAGNOSIS — G894 Chronic pain syndrome: Secondary | ICD-10-CM | POA: Diagnosis not present

## 2013-12-20 DIAGNOSIS — M961 Postlaminectomy syndrome, not elsewhere classified: Secondary | ICD-10-CM | POA: Diagnosis not present

## 2013-12-20 DIAGNOSIS — Z79891 Long term (current) use of opiate analgesic: Secondary | ICD-10-CM | POA: Diagnosis not present

## 2013-12-20 DIAGNOSIS — Z5181 Encounter for therapeutic drug level monitoring: Secondary | ICD-10-CM | POA: Diagnosis not present

## 2013-12-20 DIAGNOSIS — M5137 Other intervertebral disc degeneration, lumbosacral region: Secondary | ICD-10-CM | POA: Diagnosis not present

## 2013-12-20 DIAGNOSIS — M5414 Radiculopathy, thoracic region: Secondary | ICD-10-CM | POA: Diagnosis not present

## 2014-01-28 DIAGNOSIS — R1013 Epigastric pain: Secondary | ICD-10-CM | POA: Diagnosis not present

## 2014-01-28 DIAGNOSIS — R110 Nausea: Secondary | ICD-10-CM | POA: Diagnosis not present

## 2014-02-19 DIAGNOSIS — K5900 Constipation, unspecified: Secondary | ICD-10-CM | POA: Diagnosis not present

## 2014-02-19 DIAGNOSIS — R1013 Epigastric pain: Secondary | ICD-10-CM | POA: Diagnosis not present

## 2014-02-19 DIAGNOSIS — Z23 Encounter for immunization: Secondary | ICD-10-CM | POA: Diagnosis not present

## 2014-02-20 ENCOUNTER — Encounter: Payer: Self-pay | Admitting: Gastroenterology

## 2014-03-05 DIAGNOSIS — M5414 Radiculopathy, thoracic region: Secondary | ICD-10-CM | POA: Diagnosis not present

## 2014-03-05 DIAGNOSIS — G894 Chronic pain syndrome: Secondary | ICD-10-CM | POA: Diagnosis not present

## 2014-03-05 DIAGNOSIS — Z79891 Long term (current) use of opiate analgesic: Secondary | ICD-10-CM | POA: Diagnosis not present

## 2014-03-05 DIAGNOSIS — M5137 Other intervertebral disc degeneration, lumbosacral region: Secondary | ICD-10-CM | POA: Diagnosis not present

## 2014-03-05 DIAGNOSIS — M961 Postlaminectomy syndrome, not elsewhere classified: Secondary | ICD-10-CM | POA: Diagnosis not present

## 2014-03-17 DIAGNOSIS — M4802 Spinal stenosis, cervical region: Secondary | ICD-10-CM | POA: Diagnosis not present

## 2014-03-17 DIAGNOSIS — M5000 Cervical disc disorder with myelopathy, unspecified cervical region: Secondary | ICD-10-CM | POA: Diagnosis not present

## 2014-03-19 DIAGNOSIS — Z981 Arthrodesis status: Secondary | ICD-10-CM | POA: Diagnosis not present

## 2014-03-19 DIAGNOSIS — G4733 Obstructive sleep apnea (adult) (pediatric): Secondary | ICD-10-CM | POA: Diagnosis not present

## 2014-03-19 DIAGNOSIS — G894 Chronic pain syndrome: Secondary | ICD-10-CM | POA: Diagnosis not present

## 2014-03-19 DIAGNOSIS — M4802 Spinal stenosis, cervical region: Secondary | ICD-10-CM | POA: Diagnosis not present

## 2014-03-19 DIAGNOSIS — F329 Major depressive disorder, single episode, unspecified: Secondary | ICD-10-CM | POA: Diagnosis not present

## 2014-03-19 DIAGNOSIS — M4322 Fusion of spine, cervical region: Secondary | ICD-10-CM | POA: Diagnosis not present

## 2014-03-19 DIAGNOSIS — M4722 Other spondylosis with radiculopathy, cervical region: Secondary | ICD-10-CM | POA: Diagnosis not present

## 2014-03-19 DIAGNOSIS — M961 Postlaminectomy syndrome, not elsewhere classified: Secondary | ICD-10-CM | POA: Diagnosis not present

## 2014-03-19 DIAGNOSIS — M4712 Other spondylosis with myelopathy, cervical region: Secondary | ICD-10-CM | POA: Diagnosis not present

## 2014-04-15 ENCOUNTER — Ambulatory Visit: Payer: Medicare Other | Admitting: Gastroenterology

## 2014-04-18 DIAGNOSIS — M5000 Cervical disc disorder with myelopathy, unspecified cervical region: Secondary | ICD-10-CM | POA: Diagnosis not present

## 2014-05-16 DIAGNOSIS — G4733 Obstructive sleep apnea (adult) (pediatric): Secondary | ICD-10-CM | POA: Diagnosis not present

## 2014-05-19 DIAGNOSIS — G4733 Obstructive sleep apnea (adult) (pediatric): Secondary | ICD-10-CM | POA: Diagnosis not present

## 2014-05-20 DIAGNOSIS — Z79891 Long term (current) use of opiate analgesic: Secondary | ICD-10-CM | POA: Diagnosis not present

## 2014-06-12 DIAGNOSIS — M542 Cervicalgia: Secondary | ICD-10-CM | POA: Diagnosis not present

## 2014-06-12 DIAGNOSIS — M5000 Cervical disc disorder with myelopathy, unspecified cervical region: Secondary | ICD-10-CM | POA: Diagnosis not present

## 2014-06-12 DIAGNOSIS — M4802 Spinal stenosis, cervical region: Secondary | ICD-10-CM | POA: Diagnosis not present

## 2014-07-30 DIAGNOSIS — G894 Chronic pain syndrome: Secondary | ICD-10-CM | POA: Diagnosis not present

## 2014-07-30 DIAGNOSIS — M961 Postlaminectomy syndrome, not elsewhere classified: Secondary | ICD-10-CM | POA: Diagnosis not present

## 2014-07-30 DIAGNOSIS — M5414 Radiculopathy, thoracic region: Secondary | ICD-10-CM | POA: Diagnosis not present

## 2014-07-30 DIAGNOSIS — M5137 Other intervertebral disc degeneration, lumbosacral region: Secondary | ICD-10-CM | POA: Diagnosis not present

## 2014-07-31 DIAGNOSIS — E291 Testicular hypofunction: Secondary | ICD-10-CM | POA: Diagnosis not present

## 2014-07-31 DIAGNOSIS — M25511 Pain in right shoulder: Secondary | ICD-10-CM | POA: Diagnosis not present

## 2014-07-31 DIAGNOSIS — Z0000 Encounter for general adult medical examination without abnormal findings: Secondary | ICD-10-CM | POA: Diagnosis not present

## 2014-07-31 DIAGNOSIS — R079 Chest pain, unspecified: Secondary | ICD-10-CM | POA: Diagnosis not present

## 2014-07-31 DIAGNOSIS — E349 Endocrine disorder, unspecified: Secondary | ICD-10-CM | POA: Insufficient documentation

## 2014-07-31 DIAGNOSIS — M25512 Pain in left shoulder: Secondary | ICD-10-CM | POA: Diagnosis not present

## 2014-07-31 DIAGNOSIS — Z9889 Other specified postprocedural states: Secondary | ICD-10-CM | POA: Insufficient documentation

## 2014-07-31 DIAGNOSIS — R5383 Other fatigue: Secondary | ICD-10-CM | POA: Diagnosis not present

## 2014-07-31 DIAGNOSIS — R001 Bradycardia, unspecified: Secondary | ICD-10-CM | POA: Diagnosis not present

## 2014-07-31 DIAGNOSIS — E785 Hyperlipidemia, unspecified: Secondary | ICD-10-CM | POA: Diagnosis not present

## 2014-08-04 DIAGNOSIS — M5137 Other intervertebral disc degeneration, lumbosacral region: Secondary | ICD-10-CM | POA: Diagnosis not present

## 2014-08-04 DIAGNOSIS — M961 Postlaminectomy syndrome, not elsewhere classified: Secondary | ICD-10-CM | POA: Diagnosis not present

## 2014-08-04 DIAGNOSIS — G894 Chronic pain syndrome: Secondary | ICD-10-CM | POA: Diagnosis not present

## 2014-08-14 DIAGNOSIS — M25512 Pain in left shoulder: Secondary | ICD-10-CM | POA: Diagnosis not present

## 2014-08-14 DIAGNOSIS — M7541 Impingement syndrome of right shoulder: Secondary | ICD-10-CM | POA: Diagnosis not present

## 2014-08-19 DIAGNOSIS — G4733 Obstructive sleep apnea (adult) (pediatric): Secondary | ICD-10-CM | POA: Diagnosis not present

## 2014-09-10 DIAGNOSIS — M4802 Spinal stenosis, cervical region: Secondary | ICD-10-CM | POA: Diagnosis not present

## 2014-09-10 DIAGNOSIS — M542 Cervicalgia: Secondary | ICD-10-CM | POA: Diagnosis not present

## 2014-09-17 ENCOUNTER — Encounter: Payer: Self-pay | Admitting: Gastroenterology

## 2014-10-08 DIAGNOSIS — H5212 Myopia, left eye: Secondary | ICD-10-CM | POA: Diagnosis not present

## 2014-10-08 DIAGNOSIS — H59092 Other disorders of the left eye following cataract surgery: Secondary | ICD-10-CM | POA: Diagnosis not present

## 2014-10-08 DIAGNOSIS — Z961 Presence of intraocular lens: Secondary | ICD-10-CM | POA: Diagnosis not present

## 2014-10-08 DIAGNOSIS — H2511 Age-related nuclear cataract, right eye: Secondary | ICD-10-CM | POA: Diagnosis not present

## 2014-10-20 DIAGNOSIS — M1990 Unspecified osteoarthritis, unspecified site: Secondary | ICD-10-CM | POA: Diagnosis not present

## 2014-10-20 DIAGNOSIS — G43909 Migraine, unspecified, not intractable, without status migrainosus: Secondary | ICD-10-CM | POA: Diagnosis not present

## 2014-10-20 DIAGNOSIS — M5414 Radiculopathy, thoracic region: Secondary | ICD-10-CM | POA: Diagnosis not present

## 2014-10-20 DIAGNOSIS — Z79891 Long term (current) use of opiate analgesic: Secondary | ICD-10-CM | POA: Diagnosis not present

## 2014-10-20 DIAGNOSIS — M539 Dorsopathy, unspecified: Secondary | ICD-10-CM | POA: Diagnosis not present

## 2014-10-20 DIAGNOSIS — M961 Postlaminectomy syndrome, not elsewhere classified: Secondary | ICD-10-CM | POA: Diagnosis not present

## 2014-10-20 DIAGNOSIS — G894 Chronic pain syndrome: Secondary | ICD-10-CM | POA: Diagnosis not present

## 2014-10-20 DIAGNOSIS — G4730 Sleep apnea, unspecified: Secondary | ICD-10-CM | POA: Diagnosis not present

## 2014-10-20 DIAGNOSIS — F329 Major depressive disorder, single episode, unspecified: Secondary | ICD-10-CM | POA: Diagnosis not present

## 2014-10-20 DIAGNOSIS — M5137 Other intervertebral disc degeneration, lumbosacral region: Secondary | ICD-10-CM | POA: Diagnosis not present

## 2014-10-20 DIAGNOSIS — K210 Gastro-esophageal reflux disease with esophagitis: Secondary | ICD-10-CM | POA: Diagnosis not present

## 2014-11-18 DIAGNOSIS — H2511 Age-related nuclear cataract, right eye: Secondary | ICD-10-CM | POA: Diagnosis not present

## 2014-11-18 DIAGNOSIS — H5212 Myopia, left eye: Secondary | ICD-10-CM | POA: Diagnosis not present

## 2014-11-18 DIAGNOSIS — H25811 Combined forms of age-related cataract, right eye: Secondary | ICD-10-CM | POA: Diagnosis not present

## 2014-11-18 DIAGNOSIS — H26492 Other secondary cataract, left eye: Secondary | ICD-10-CM | POA: Diagnosis not present

## 2014-11-18 DIAGNOSIS — H527 Unspecified disorder of refraction: Secondary | ICD-10-CM | POA: Diagnosis not present

## 2014-11-18 DIAGNOSIS — H26493 Other secondary cataract, bilateral: Secondary | ICD-10-CM | POA: Diagnosis not present

## 2014-11-18 DIAGNOSIS — Z9842 Cataract extraction status, left eye: Secondary | ICD-10-CM | POA: Diagnosis not present

## 2014-12-11 DIAGNOSIS — Z23 Encounter for immunization: Secondary | ICD-10-CM | POA: Diagnosis not present

## 2014-12-15 DIAGNOSIS — G894 Chronic pain syndrome: Secondary | ICD-10-CM | POA: Diagnosis not present

## 2014-12-15 DIAGNOSIS — M7551 Bursitis of right shoulder: Secondary | ICD-10-CM | POA: Diagnosis not present

## 2014-12-15 DIAGNOSIS — M542 Cervicalgia: Secondary | ICD-10-CM | POA: Diagnosis not present

## 2015-01-02 DIAGNOSIS — M5414 Radiculopathy, thoracic region: Secondary | ICD-10-CM | POA: Diagnosis not present

## 2015-01-02 DIAGNOSIS — M961 Postlaminectomy syndrome, not elsewhere classified: Secondary | ICD-10-CM | POA: Diagnosis not present

## 2015-01-02 DIAGNOSIS — M5137 Other intervertebral disc degeneration, lumbosacral region: Secondary | ICD-10-CM | POA: Diagnosis not present

## 2015-01-14 ENCOUNTER — Ambulatory Visit (INDEPENDENT_AMBULATORY_CARE_PROVIDER_SITE_OTHER): Payer: Medicare Other | Admitting: Family Medicine

## 2015-01-14 ENCOUNTER — Encounter: Payer: Self-pay | Admitting: Family Medicine

## 2015-01-14 VITALS — BP 137/85 | HR 76 | Temp 97.6°F | Ht 73.0 in | Wt 223.4 lb

## 2015-01-14 DIAGNOSIS — Z23 Encounter for immunization: Secondary | ICD-10-CM

## 2015-01-14 DIAGNOSIS — G8929 Other chronic pain: Secondary | ICD-10-CM | POA: Diagnosis not present

## 2015-01-14 DIAGNOSIS — M549 Dorsalgia, unspecified: Secondary | ICD-10-CM

## 2015-01-14 DIAGNOSIS — R300 Dysuria: Secondary | ICD-10-CM

## 2015-01-14 LAB — POCT URINALYSIS DIPSTICK
Glucose, UA: NEGATIVE
LEUKOCYTES UA: NEGATIVE
NITRITE UA: NEGATIVE
PH UA: 5
Protein, UA: NEGATIVE
RBC UA: NEGATIVE
Spec Grav, UA: >=1.03
UROBILINOGEN UA: NEGATIVE

## 2015-01-14 LAB — POCT UA - MICROSCOPIC ONLY
BACTERIA, U MICROSCOPIC: NEGATIVE
CASTS, UR, LPF, POC: NEGATIVE
Crystals, Ur, HPF, POC: NEGATIVE
Epithelial cells, urine per micros: NEGATIVE
RBC, urine, microscopic: NEGATIVE
YEAST UA: NEGATIVE

## 2015-01-14 MED ORDER — SULFAMETHOXAZOLE-TRIMETHOPRIM 800-160 MG PO TABS: 1.0000 | ORAL_TABLET | Freq: Two times a day (BID) | ORAL | Status: DC

## 2015-01-14 NOTE — Progress Notes (Signed)
BP 137/85 mmHg  Pulse 76  Temp(Src) 97.6 F (36.4 C) (Oral)  Ht 6\' 1"  (1.854 m)  Wt 223 lb 6.4 oz (101.334 kg)  BMI 29.48 kg/m2   Subjective:    Patient ID: Jeff Stewart, male    DOB: 08-Apr-1955, 59 y.o.   MRN: [id number]  HPI: Jeff Stewart is a 59 y.o. male presenting on 01/14/2015 for Urinary Tract Infection   HPI Dysuria Patient has been having strong odor and dysuria that began 2-3 weeks ago. Is also known to our clinic in urine during the same time. He has some suprapubic abdominal pain as well with that. He denies any fevers or chills but does say he has some pain going up to his right side of his back near his flank. He does not really get these very often but has had one infection in his bladder before and this feels similar to that. He is also having to go more frequently.  Relevant past medical, surgical, family and social history reviewed and updated as indicated. Interim medical history since our last visit reviewed. Allergies and medications reviewed and updated.  Review of Systems  Constitutional: Negative for fever.  HENT: Negative for ear discharge and ear pain.   Eyes: Negative for discharge and visual disturbance.  Respiratory: Negative for shortness of breath and wheezing.   Cardiovascular: Negative for chest pain and leg swelling.  Gastrointestinal: Negative for abdominal pain, diarrhea and constipation.  Genitourinary: Positive for dysuria, urgency, frequency and flank pain. Negative for hematuria, decreased urine volume and difficulty urinating.  Musculoskeletal: Negative for back pain and gait problem.  Skin: Negative for rash.  Neurological: Negative for syncope, light-headedness and headaches.  All other systems reviewed and are negative.   Per HPI unless specifically indicated above  Social History   Social History  . Marital Status: Married    Spouse Name: N/A  . Number of Children: 3  . Years of Education: N/A   Occupational History   . 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 Smoking status: Never Smoker   . Smokeless tobacco: Never Used  . Alcohol Use: No  . Drug Use: No  . Sexual Activity: Yes    Birth Control/ Protection: Post-menopausal     Comment: married for 1972   Other Topics Concern  . Not on file   Social History Narrative   Lives with wife     Past Surgical History  Procedure Laterality Date  . Back surgery      x 6  . Nasal septum surgery      x 6  . Carpal tunnel release      bilateral  . Elbow surgery    . Knee arthroscopy    . Inguinal hernia repair Right     Family History  Problem Relation Age of Onset  . CAD Father 65  . Emphysema Father   . Stroke Mother 76  . CAD Brother 73    CABG  . CAD Brother       Medication List       This list is accurate as of: 01/14/15  4:29 PM.  Always use your most recent med list.               methocarbamol 750 MG tablet  Commonly known as:  ROBAXIN  Take 750 mg by mouth as needed.     morphine 30 MG tablet  Commonly known as:  MSIR  Take 30 mg by mouth at bedtime as needed.     morphine 20 MG/ML concentrated solution  Commonly known as:  ROXANOL  Take by mouth. pump     sertraline 100 MG tablet  Commonly known as:  ZOLOFT  Take 100 mg by mouth daily.           Objective:    BP 137/85 mmHg  Pulse 76  Temp(Src) 97.6 F (36.4 C) (Oral)  Ht 6\' 1"  (1.854 m)  Wt 223 lb 6.4 oz (101.334 kg)  BMI 29.48 kg/m2  Wt Readings from Last 3 Encounters:  01/14/15 223 lb 6.4 oz (101.334 kg)  06/14/13 247 lb 14.4 oz (112.447 kg)  10/01/12 246 lb (111.585 kg)    Physical Exam  Constitutional: He is oriented to person, place, and time. He appears well-developed and well-nourished. No distress.  Eyes: Conjunctivae and EOM are normal. Pupils are equal, round, and reactive to light. Right eye exhibits no discharge. No scleral icterus.  Neck: Neck supple. No thyromegaly present.  Cardiovascular: Normal rate,  regular rhythm, normal heart sounds and intact distal pulses.   No murmur heard. Pulmonary/Chest: Effort normal and breath sounds normal. No respiratory distress. He has no wheezes.  Abdominal: Soft. Bowel sounds are normal. He exhibits no distension. There is tenderness (suprapubic, no CVA tenderness on exam.). There is no rebound and no guarding.  Musculoskeletal: Normal range of motion. He exhibits no edema or tenderness.  Lymphadenopathy:    He has no cervical adenopathy.  Neurological: He is alert and oriented to person, place, and time. Coordination normal.  Skin: Skin is warm and dry. No rash noted. He is not diaphoretic.  Psychiatric: He has a normal mood and affect. His behavior is normal.  Vitals reviewed.   Results for orders placed or performed in visit on 07/01/13  Lipid panel  Result Value Ref Range   Cholesterol 171 0 - 200 mg/dL   Triglycerides 78 <150 mg/dL   HDL 57 >39 mg/dL   Total CHOL/HDL Ratio 3.0 Ratio   VLDL 16 0 - 40 mg/dL   LDL Cholesterol 98 0 - 99 mg/dL      Assessment & Plan:   Problem List Items Addressed This Visit      Other   Chronic back pain   Relevant Medications   morphine (ROXANOL) 20 MG/ML concentrated solution    Other Visit Diagnoses    Dysuria    -  Primary    Urine is not overly conclusive, will send Bactrim because of symptoms    Relevant Medications    sulfamethoxazole-trimethoprim (BACTRIM DS) 800-160 MG tablet    Other Relevant Orders    POCT UA - Microscopic Only (Completed)    POCT urinalysis dipstick (Completed)    Encounter for immunization            Follow up plan: Return in about 2 months (around 03/16/2015), or if symptoms worsen or fail to improve, for Well adult exam and screening labs.  Caryl Pina, MD [facility] 01/14/2015, 4:29 PM

## 2015-01-19 DIAGNOSIS — R110 Nausea: Secondary | ICD-10-CM | POA: Diagnosis not present

## 2015-01-19 DIAGNOSIS — R61 Generalized hyperhidrosis: Secondary | ICD-10-CM | POA: Diagnosis not present

## 2015-01-19 DIAGNOSIS — R079 Chest pain, unspecified: Secondary | ICD-10-CM | POA: Diagnosis not present

## 2015-01-19 DIAGNOSIS — R0602 Shortness of breath: Secondary | ICD-10-CM | POA: Diagnosis not present

## 2015-01-19 DIAGNOSIS — I159 Secondary hypertension, unspecified: Secondary | ICD-10-CM | POA: Diagnosis not present

## 2015-01-19 DIAGNOSIS — Z79899 Other long term (current) drug therapy: Secondary | ICD-10-CM | POA: Diagnosis not present

## 2015-01-19 DIAGNOSIS — Z886 Allergy status to analgesic agent status: Secondary | ICD-10-CM | POA: Diagnosis not present

## 2015-01-19 DIAGNOSIS — I10 Essential (primary) hypertension: Secondary | ICD-10-CM | POA: Diagnosis not present

## 2015-01-20 DIAGNOSIS — R079 Chest pain, unspecified: Secondary | ICD-10-CM | POA: Diagnosis not present

## 2015-01-20 DIAGNOSIS — I10 Essential (primary) hypertension: Secondary | ICD-10-CM | POA: Diagnosis not present

## 2015-02-23 ENCOUNTER — Ambulatory Visit (INDEPENDENT_AMBULATORY_CARE_PROVIDER_SITE_OTHER): Payer: Medicare Other | Admitting: Family Medicine

## 2015-02-23 ENCOUNTER — Encounter: Payer: Self-pay | Admitting: Family Medicine

## 2015-02-23 VITALS — BP 112/76 | HR 74 | Temp 97.4°F | Ht 73.0 in | Wt 223.4 lb

## 2015-02-23 DIAGNOSIS — F418 Other specified anxiety disorders: Secondary | ICD-10-CM | POA: Diagnosis not present

## 2015-02-23 DIAGNOSIS — F329 Major depressive disorder, single episode, unspecified: Secondary | ICD-10-CM

## 2015-02-23 DIAGNOSIS — F32A Depression, unspecified: Secondary | ICD-10-CM | POA: Insufficient documentation

## 2015-02-23 DIAGNOSIS — E291 Testicular hypofunction: Secondary | ICD-10-CM | POA: Diagnosis not present

## 2015-02-23 DIAGNOSIS — Z13220 Encounter for screening for lipoid disorders: Secondary | ICD-10-CM

## 2015-02-23 DIAGNOSIS — F419 Anxiety disorder, unspecified: Principal | ICD-10-CM | POA: Insufficient documentation

## 2015-02-23 DIAGNOSIS — R7989 Other specified abnormal findings of blood chemistry: Secondary | ICD-10-CM

## 2015-02-23 DIAGNOSIS — E669 Obesity, unspecified: Secondary | ICD-10-CM | POA: Diagnosis not present

## 2015-02-23 DIAGNOSIS — Z131 Encounter for screening for diabetes mellitus: Secondary | ICD-10-CM

## 2015-02-23 DIAGNOSIS — R079 Chest pain, unspecified: Secondary | ICD-10-CM | POA: Diagnosis not present

## 2015-02-23 MED ORDER — SERTRALINE HCL 50 MG PO TABS: 50.0000 mg | ORAL_TABLET | Freq: Every day | ORAL | Status: DC

## 2015-02-23 NOTE — Progress Notes (Signed)
BP 112/76 mmHg  Pulse 74  Temp(Src) 97.4 F (36.3 C) (Oral)  Ht _0  (1.854 m)  Wt 223 lb 6.4 oz (101.334 kg)  BMI 29.48 kg/m2   Subjective:    Patient ID: Jeff Stewart, male    DOB: [birth year], 59 y.o.   MRN: [id number]  HPI: Jeff Stewart is a 59 y.o. male presenting on 02/23/2015 for Medication Refill and Testosterone injections   HPI Anxiety and depression Patient has been on Zoloft for at least 8 months for his anxiety and depression that was associated mostly with his pain. After surgery he has been feeling a lot better and he feels like his pain is minimal and controlled and would like to try coming off of the Zoloft for a while and see how it goes. He denies any thoughts of suicide or hurting himself. He is really feeling well about life and everything with his family to.  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 He says his energy is just continually down. He used to do injections and wants to know where it is been off of the injections for 3-4 weeks.  Relevant past medical, surgical, family and social history reviewed and updated as indicated. Interim medical history since our last visit reviewed. Allergies and medications reviewed and updated.  Review of Systems  Constitutional: Positive for fatigue (Decreased energy). Negative for fever and chills.  HENT: Negative for congestion, ear discharge and ear pain.   Eyes: Negative for discharge and visual disturbance.  Respiratory: Negative for chest tightness, shortness of breath and wheezing.   Cardiovascular: Negative for chest pain, palpitations and leg swelling.  Gastrointestinal: Negative for abdominal pain, diarrhea and constipation.  Genitourinary: Negative for difficulty urinating.  Musculoskeletal: Negative for back pain and gait problem.  Skin: Negative for rash.  Neurological: Negative for dizziness,  syncope, weakness, light-headedness, numbness and headaches.  Psychiatric/Behavioral: Negative for suicidal ideas, behavioral problems, sleep disturbance, self-injury, dysphoric mood, decreased concentration and agitation. The patient is not nervous/anxious.   All other systems reviewed and are negative.   Per HPI unless specifically indicated above     Medication List       This list is accurate as of: 02/23/15  3:34 PM.  Always use your most recent med list.               methocarbamol 750 MG tablet  Commonly known as:  ROBAXIN  Take 750 mg by mouth as needed.     morphine 30 MG tablet  Commonly known as:  MSIR  Take 30 mg by mouth at bedtime as needed.     morphine 20 MG/ML concentrated solution  Commonly known as:  ROXANOL  Take by mouth. pump     sertraline 50 MG tablet  Commonly known as:  ZOLOFT  Take 1 tablet (50 mg total) by mouth daily.           Objective:    BP 112/76 mmHg  Pulse 74  Temp(Src) 97.4 F (36.3 C) (Oral)  Ht _1  (1.854 m)  Wt 223 lb 6.4 oz (101.334 kg)  BMI 29.48 kg/m2  Wt Readings from Last 3 Encounters:  02/23/15 223 lb 6.4 oz (101.334 kg)  01/14/15 223 lb 6.4 oz (101.334 kg)  06/14/13 247 lb 14.4 oz (112.447 kg)    Physical Exam  Constitutional: He is oriented to person,  place, and time. He appears well-developed and well-nourished. No distress.  Eyes: Conjunctivae and EOM are normal. Right eye exhibits no discharge. No scleral icterus.  Neck: Neck supple. No thyromegaly present.  Cardiovascular: Normal rate, regular rhythm, normal heart sounds and intact distal pulses.   No murmur heard. Pulmonary/Chest: Effort normal and breath sounds normal. No respiratory distress. He has no wheezes.  Musculoskeletal: Normal range of motion. He exhibits no edema.  Lymphadenopathy:    He has no cervical adenopathy.  Neurological: He is alert and oriented to person, place, and time. Coordination normal.  Skin: Skin is warm and dry. No rash  noted. He is not diaphoretic.  Psychiatric: He has a normal mood and affect. His speech is normal and behavior is normal. Judgment and thought content normal. His mood appears not anxious. His affect is not angry and not labile. Cognition and memory are normal. He does not exhibit a depressed mood. He expresses no suicidal ideation. He expresses no suicidal plans.  Nursing note and vitals reviewed.   Results for orders placed or performed in visit on 01/14/15  POCT UA - Microscopic Only  Result Value Ref Range   WBC, Ur, HPF, POC 1-3    RBC, urine, microscopic neg    Bacteria, U Microscopic neg    Mucus, UA mod    Epithelial cells, urine per micros neg    Crystals, Ur, HPF, POC neg    Casts, Ur, LPF, POC neg    Yeast, UA neg   POCT urinalysis dipstick  Result Value Ref Range   Color, UA gold    Clarity, UA clear    Glucose, UA neg    Bilirubin, UA small    Ketones, UA trace    Spec Grav, UA >=1.030    Blood, UA neg    pH, UA 5.0    Protein, UA neg    Urobilinogen, UA negative    Nitrite, UA neg    Leukocytes, UA Negative Negative      Assessment & Plan:   Problem List Items Addressed This Visit      Other   Anxiety and depression - Primary    Patient has had anxiety and depression in the past, he feels like he is doing really well now and wants to see if he can come off the Zoloft. He is instructed on how to do a taper and we'll see how it goes from there. He will come back in a month if not going well.      Relevant Medications   sertraline (ZOLOFT) 50 MG tablet   Other Relevant Orders   TSH   Testosterone,Free and Total    Other Visit Diagnoses    Screening for diabetes mellitus        Relevant Orders    CMP14+EGFR    Screening for lipid disorders        Low serum testosterone level        Relevant Orders    Testosterone,Free and Total    Obesity        Relevant Orders    Lipid panel        Follow up plan: Return in about 3 months (around 05/24/2015), or  if symptoms worsen or fail to improve, for Anxiety and depression.  Counseling provided for all of the vaccine components Orders Placed This Encounter  Procedures  . CMP14+EGFR  . Lipid panel  . TSH  . Testosterone,Free and Total    Caryl Pina, MD [facility]  [place] 02/23/2015, 3:34 PM

## 2015-02-23 NOTE — Assessment & Plan Note (Signed)
Patient has had anxiety and depression in the past, he feels like he is doing really well now and wants to see if he can come off the Zoloft. He is instructed on how to do a taper and we'll see how it goes from there. He will come back in a month if not going well.

## 2015-02-24 DIAGNOSIS — Z79899 Other long term (current) drug therapy: Secondary | ICD-10-CM | POA: Diagnosis not present

## 2015-02-24 DIAGNOSIS — I10 Essential (primary) hypertension: Secondary | ICD-10-CM | POA: Diagnosis not present

## 2015-02-24 DIAGNOSIS — R51 Headache: Secondary | ICD-10-CM | POA: Diagnosis not present

## 2015-02-24 DIAGNOSIS — Z7982 Long term (current) use of aspirin: Secondary | ICD-10-CM | POA: Diagnosis not present

## 2015-02-24 DIAGNOSIS — R0789 Other chest pain: Secondary | ICD-10-CM | POA: Diagnosis not present

## 2015-02-24 DIAGNOSIS — F419 Anxiety disorder, unspecified: Secondary | ICD-10-CM | POA: Diagnosis not present

## 2015-02-24 DIAGNOSIS — R42 Dizziness and giddiness: Secondary | ICD-10-CM | POA: Diagnosis not present

## 2015-02-25 ENCOUNTER — Encounter: Payer: Self-pay | Admitting: Family Medicine

## 2015-02-25 ENCOUNTER — Ambulatory Visit (INDEPENDENT_AMBULATORY_CARE_PROVIDER_SITE_OTHER): Payer: Medicare Other | Admitting: Family Medicine

## 2015-02-25 VITALS — BP 126/87 | HR 75 | Temp 99.0°F | Ht 73.0 in | Wt 225.4 lb

## 2015-02-25 DIAGNOSIS — Z131 Encounter for screening for diabetes mellitus: Secondary | ICD-10-CM

## 2015-02-25 DIAGNOSIS — E291 Testicular hypofunction: Secondary | ICD-10-CM

## 2015-02-25 DIAGNOSIS — F419 Anxiety disorder, unspecified: Secondary | ICD-10-CM

## 2015-02-25 DIAGNOSIS — F418 Other specified anxiety disorders: Secondary | ICD-10-CM | POA: Diagnosis not present

## 2015-02-25 DIAGNOSIS — R002 Palpitations: Secondary | ICD-10-CM

## 2015-02-25 DIAGNOSIS — F329 Major depressive disorder, single episode, unspecified: Secondary | ICD-10-CM

## 2015-02-25 DIAGNOSIS — L03115 Cellulitis of right lower limb: Secondary | ICD-10-CM

## 2015-02-25 DIAGNOSIS — R7989 Other specified abnormal findings of blood chemistry: Secondary | ICD-10-CM

## 2015-02-25 DIAGNOSIS — I10 Essential (primary) hypertension: Secondary | ICD-10-CM | POA: Diagnosis not present

## 2015-02-25 DIAGNOSIS — E669 Obesity, unspecified: Secondary | ICD-10-CM

## 2015-02-25 DIAGNOSIS — F32A Depression, unspecified: Secondary | ICD-10-CM

## 2015-02-25 MED ORDER — METOPROLOL TARTRATE 25 MG PO TABS: 25.0000 mg | ORAL_TABLET | Freq: Two times a day (BID) | ORAL | Status: DC

## 2015-02-25 MED ORDER — SULFAMETHOXAZOLE-TRIMETHOPRIM 800-160 MG PO TABS: 1.0000 | ORAL_TABLET | Freq: Two times a day (BID) | ORAL | Status: DC

## 2015-02-25 NOTE — Progress Notes (Signed)
BP 126/87 mmHg  Pulse 75  Temp(Src) 99 F (37.2 C) (Oral)  Ht 6\' 1"  (1.854 m)  Wt 225 lb 6.4 oz (102.241 kg)  BMI 29.74 kg/m2   Subjective:    Patient ID: Jeff Stewart, male    DOB: 05-10-1955, 59 y.o.   MRN: [id number]  HPI: Jeff Stewart is a 59 y.o. male presenting on 02/25/2015 for ER followup for hypertension   HPI Skin infection leg He has had this skin infection on his leg above his right knee for at least a couple weeks. It comes and goes and he has been using topical antibiotic for it. It has drained some occasionally but it is not completely going away. At times he gets more hot and red another is down again. He denies any fevers or chills or skin infections anywhere else.  ER follow-up for hypertension and chest pressure 2 days ago in the evening patient had an episode where he started feeling out of sorts and not himself and started having a headache and some chest pressure. The next morning he went to the police station to see if they could take his blood pressure and they called the EMS and found that his blood pressure was in the 180s or 190s. He was then sent to [facility] via paramedic. At [facility] he had a CBC which was normal and a basic metabolic panel which was normal, he also had 1 troponin which was normal. He had a chest x-ray which was normal. He also had an EKG but I do not have that record. His blood pressure remained persistently elevated and they recommended that he go see a cardiologist and started on a blood pressure medication. Today in office his blood pressure is 126/87 which is low like it was the previous time he is here. During that episode that he had 2 days ago he described dizziness, flushing, chest pressure going down his left arm. He is also recently had a stress treadmill earlier this year and was recommended to do a cardiac catheterization. All of the symptoms are gone today.  Relevant past medical, surgical, family and social history reviewed  and updated as indicated. Interim medical history since our last visit reviewed. Allergies and medications reviewed and updated.  Review of Systems  Constitutional: Negative for fever and chills.  HENT: Negative for congestion, ear discharge and ear pain.   Eyes: Negative for discharge and visual disturbance.  Respiratory: Negative for shortness of breath and wheezing.   Cardiovascular: Negative for chest pain and leg swelling.  Gastrointestinal: Negative for abdominal pain, diarrhea and constipation.  Genitourinary: Negative for difficulty urinating.  Musculoskeletal: Negative for back pain and gait problem.  Skin: Positive for color change. Negative for rash.  Neurological: Negative for dizziness, syncope, light-headedness and headaches.  All other systems reviewed and are negative.   Per HPI unless specifically indicated above     Medication List       This list is accurate as of: 02/25/15 12:23 PM.  Always use your most recent med list.               methocarbamol 750 MG tablet  Commonly known as:  ROBAXIN  Take 750 mg by mouth as needed.     metoprolol tartrate 25 MG tablet  Commonly known as:  LOPRESSOR  Take 1 tablet (25 mg total) by mouth 2 (two) times daily.     morphine 30 MG tablet  Commonly known as:  MSIR  Take  30 mg by mouth at bedtime as needed.     morphine 20 MG/ML concentrated solution  Commonly known as:  ROXANOL  Take by mouth. pump     sertraline 50 MG tablet  Commonly known as:  ZOLOFT  Take 1 tablet (50 mg total) by mouth daily.     sulfamethoxazole-trimethoprim 800-160 MG tablet  Commonly known as:  BACTRIM DS,SEPTRA DS  Take 1 tablet by mouth 2 (two) times daily.           Objective:    BP 126/87 mmHg  Pulse 75  Temp(Src) 99 F (37.2 C) (Oral)  Ht 6\' 1"  (1.854 m)  Wt 225 lb 6.4 oz (102.241 kg)  BMI 29.74 kg/m2  Wt Readings from Last 3 Encounters:  02/25/15 225 lb 6.4 oz (102.241 kg)  02/23/15 223 lb 6.4 oz (101.334 kg)    01/14/15 223 lb 6.4 oz (101.334 kg)    Physical Exam  Constitutional: He is oriented to person, place, and time. He appears well-developed and well-nourished. No distress.  Eyes: Conjunctivae and EOM are normal. Pupils are equal, round, and reactive to light. Right eye exhibits no discharge. No scleral icterus.  Cardiovascular: Normal rate, regular rhythm, normal heart sounds and intact distal pulses.   No murmur heard. Pulmonary/Chest: Effort normal and breath sounds normal. No respiratory distress. He has no wheezes.  Musculoskeletal: Normal range of motion. He exhibits no edema.  Neurological: He is alert and oriented to person, place, and time. Coordination normal.  Skin: Skin is warm and dry. No rash noted. He is not diaphoretic. There is erythema.     Psychiatric: He has a normal mood and affect. His behavior is normal.  Vitals reviewed.   Results for orders placed or performed in visit on 01/14/15  POCT UA - Microscopic Only  Result Value Ref Range   WBC, Ur, HPF, POC 1-3    RBC, urine, microscopic neg    Bacteria, U Microscopic neg    Mucus, UA mod    Epithelial cells, urine per micros neg    Crystals, Ur, HPF, POC neg    Casts, Ur, LPF, POC neg    Yeast, UA neg   POCT urinalysis dipstick  Result Value Ref Range   Color, UA gold    Clarity, UA clear    Glucose, UA neg    Bilirubin, UA small    Ketones, UA trace    Spec Grav, UA >=1.030    Blood, UA neg    pH, UA 5.0    Protein, UA neg    Urobilinogen, UA negative    Nitrite, UA neg    Leukocytes, UA Negative Negative      Assessment & Plan:   Problem List Items Addressed This Visit      Other   Anxiety and depression    Other Visit Diagnoses    Cellulitis of leg, right    -  Primary    Relevant Medications    sulfamethoxazole-trimethoprim (BACTRIM DS,SEPTRA DS) 800-160 MG tablet    Screening for diabetes mellitus        Obesity        Low serum testosterone level        Palpitations        Relevant  Medications    metoprolol tartrate (LOPRESSOR) 25 MG tablet    Essential hypertension        We will start Lopressor 25 mg twice a day and see if it helps.  Relevant Medications    metoprolol tartrate (LOPRESSOR) 25 MG tablet        Follow up plan: Return in about 4 weeks (around 03/25/2015), or if symptoms worsen or fail to improve, for HTN recheck.  Counseling provided for all of the vaccine components No orders of the defined types were placed in this encounter.    Caryl Pina, MD [facility] 02/25/2015, 12:23 PM

## 2015-02-27 LAB — TESTOSTERONE,FREE AND TOTAL
TESTOSTERONE FREE: 2.2 pg/mL — AB (ref 6.6–18.1)
Testosterone: 74 ng/dL — ABNORMAL LOW (ref 348–1197)

## 2015-02-27 LAB — LIPID PANEL
CHOLESTEROL TOTAL: 190 mg/dL (ref 100–199)
Chol/HDL Ratio: 2.8 ratio units (ref 0.0–5.0)
HDL: 68 mg/dL (ref >39)
LDL Calculated: 105 mg/dL — ABNORMAL HIGH (ref 0–99)
TRIGLYCERIDES: 83 mg/dL (ref 0–149)
VLDL CHOLESTEROL CAL: 17 mg/dL (ref 5–40)

## 2015-02-27 LAB — CMP14+EGFR
ALT: 26 IU/L (ref 0–44)
AST: 26 IU/L (ref 0–40)
Albumin/Globulin Ratio: 2 (ref 1.1–2.5)
Albumin: 4.4 g/dL (ref 3.6–4.8)
Alkaline Phosphatase: 76 IU/L (ref 39–117)
BILIRUBIN TOTAL: 0.5 mg/dL (ref 0.0–1.2)
BUN/Creatinine Ratio: 16 (ref 10–22)
BUN: 15 mg/dL (ref 8–27)
CHLORIDE: 101 mmol/L (ref 97–106)
CO2: 26 mmol/L (ref 18–29)
Calcium: 9.5 mg/dL (ref 8.6–10.2)
Creatinine, Ser: 0.91 mg/dL (ref 0.76–1.27)
GFR calc non Af Amer: 91 mL/min/{1.73_m2} (ref >59)
GFR, EST AFRICAN AMERICAN: 105 mL/min/{1.73_m2} (ref >59)
GLUCOSE: 86 mg/dL (ref 65–99)
Globulin, Total: 2.2 g/dL (ref 1.5–4.5)
Potassium: 4.6 mmol/L (ref 3.5–5.2)
Sodium: 142 mmol/L (ref 136–144)
TOTAL PROTEIN: 6.6 g/dL (ref 6.0–8.5)

## 2015-02-27 LAB — TSH: TSH: 3.76 u[IU]/mL (ref 0.450–4.500)

## 2015-03-02 NOTE — Addendum Note (Signed)
Addended by: Shelbie Ammons on: 03/02/2015 08:29 AM   Modules accepted: Orders

## 2015-03-03 ENCOUNTER — Telehealth: Payer: Self-pay | Admitting: Family Medicine

## 2015-03-03 NOTE — Telephone Encounter (Signed)
Pt given appt with Dr.Dettinger tomorrow at 10:25.

## 2015-03-04 ENCOUNTER — Ambulatory Visit (INDEPENDENT_AMBULATORY_CARE_PROVIDER_SITE_OTHER): Payer: Medicare Other | Admitting: Family Medicine

## 2015-03-04 ENCOUNTER — Encounter: Payer: Self-pay | Admitting: Family Medicine

## 2015-03-04 ENCOUNTER — Encounter (INDEPENDENT_AMBULATORY_CARE_PROVIDER_SITE_OTHER): Payer: Self-pay

## 2015-03-04 VITALS — BP 149/94 | HR 57 | Temp 98.2°F | Ht 73.0 in | Wt 220.6 lb

## 2015-03-04 DIAGNOSIS — R42 Dizziness and giddiness: Secondary | ICD-10-CM | POA: Diagnosis not present

## 2015-03-04 DIAGNOSIS — I10 Essential (primary) hypertension: Secondary | ICD-10-CM | POA: Diagnosis not present

## 2015-03-04 DIAGNOSIS — L03115 Cellulitis of right lower limb: Secondary | ICD-10-CM

## 2015-03-04 MED ORDER — HYDROCHLOROTHIAZIDE 12.5 MG PO CAPS: 12.5000 mg | ORAL_CAPSULE | Freq: Every day | ORAL | Status: DC

## 2015-03-04 MED ORDER — SULFAMETHOXAZOLE-TRIMETHOPRIM 800-160 MG PO TABS: 1.0000 | ORAL_TABLET | Freq: Two times a day (BID) | ORAL | Status: DC

## 2015-03-04 NOTE — Addendum Note (Signed)
Addended by: Michaela Corner on: 03/04/2015 04:57 PM   Modules accepted: Orders

## 2015-03-04 NOTE — Progress Notes (Signed)
BP 149/94 mmHg  Pulse 57  Temp(Src) 98.2 F (36.8 C) (Oral)  Ht _0  (1.854 m)  Wt 220 lb 9.6 oz (100.064 kg)  BMI 29.11 kg/m2   Subjective:    Patient ID: Jeff Stewart, male    DOB: 1955/12/27, 59 y.o.   MRN: [id number]  HPI: Jeff Stewart is a 59 y.o. male presenting on 03/04/2015 for Nausea, dizziness and headache since starting Lopressor   HPI Hypertension Patient presents today for hypertension recheck. He says his current medication since he started is been making him feel lightheaded and dizzy and flushed. His blood pressure is still elevated even though he feels like that. He also feels some left chest pressure. He did have a stress test in the ER 1 month ago but has not had any further testing. He denies any focal numbness or weakness. He denies any visual changes.  Cellulitis Patient has an area on his right anterior knee just lateral to the patella that is infected with cellulitis last week he was started on Bactrim. He has been taking the Bactrim up until yesterday. He denies any fevers or chills. He said that 2 days ago the lesion did drain purulent drainage. He still feels like there is significant swelling and pressure in that region. The redness and warmth has reduced. He has no other skin lesions and has not had these historically.  Relevant past medical, surgical, family and social history reviewed and updated as indicated. Interim medical history since our last visit reviewed. Allergies and medications reviewed and updated.  Review of Systems  Constitutional: Negative for fever and chills.  HENT: Negative for congestion, ear discharge and ear pain.   Eyes: Negative for discharge, redness and visual disturbance.  Respiratory: Negative for chest tightness, shortness of breath and wheezing.   Cardiovascular: Positive for chest pain and palpitations. Negative for leg swelling.  Gastrointestinal: Positive for nausea. Negative for vomiting, abdominal pain,  diarrhea, constipation and blood in stool.  Genitourinary: Negative for difficulty urinating.  Musculoskeletal: Negative for back pain and gait problem.  Skin: Positive for color change and rash.  Neurological: Positive for dizziness and light-headedness. Negative for syncope, speech difficulty, weakness, numbness and headaches.  All other systems reviewed and are negative.   Per HPI unless specifically indicated above     Medication List       This list is accurate as of: 03/04/15 11:23 AM.  Always use your most recent med list.               hydrochlorothiazide 12.5 MG capsule  Commonly known as:  MICROZIDE  Take 1 capsule (12.5 mg total) by mouth daily.     methocarbamol 750 MG tablet  Commonly known as:  ROBAXIN  Take 750 mg by mouth as needed.     morphine 30 MG tablet  Commonly known as:  MSIR  Take 30 mg by mouth at bedtime as needed.     morphine 20 MG/ML concentrated solution  Commonly known as:  ROXANOL  Take by mouth. pump     sertraline 50 MG tablet  Commonly known as:  ZOLOFT  Take 1 tablet (50 mg total) by mouth daily.     sulfamethoxazole-trimethoprim 800-160 MG tablet  Commonly known as:  BACTRIM DS,SEPTRA DS  Take 1 tablet by mouth 2 (two) times daily.           Objective:    BP 149/94 mmHg  Pulse 57  Temp(Src) 98.2 F (36.8 C) (  Oral)  Ht _0  (1.854 m)  Wt 220 lb 9.6 oz (100.064 kg)  BMI 29.11 kg/m2  Wt Readings from Last 3 Encounters:  03/04/15 220 lb 9.6 oz (100.064 kg)  02/25/15 225 lb 6.4 oz (102.241 kg)  02/23/15 223 lb 6.4 oz (101.334 kg)    Physical Exam  Constitutional: He is oriented to person, place, and time. He appears well-developed and well-nourished. No distress.  HENT:  Right Ear: External ear normal.  Left Ear: External ear normal.  Nose: Nose normal.  Mouth/Throat: Oropharynx is clear and moist. No oropharyngeal exudate.  Eyes: Conjunctivae and EOM are normal. Pupils are equal, round, and reactive to light.  Right eye exhibits no discharge. No scleral icterus.  Neck: Neck supple. No thyromegaly present.  Cardiovascular: Normal rate, regular rhythm, S1 normal, S2 normal, normal heart sounds and intact distal pulses.   No murmur heard. Pulmonary/Chest: Effort normal and breath sounds normal. No respiratory distress. He has no wheezes.  Abdominal: He exhibits no distension. There is no tenderness.  Musculoskeletal: Normal range of motion. He exhibits no edema.  Lymphadenopathy:    He has no cervical adenopathy.  Neurological: He is alert and oriented to person, place, and time. He displays normal reflexes. No cranial nerve deficit or sensory deficit. He exhibits normal muscle tone. Coordination normal.  Skin: Skin is warm and dry. No rash noted. He is not diaphoretic.     Psychiatric: He has a normal mood and affect. His behavior is normal.  Vitals reviewed.   Results for orders placed or performed in visit on 02/25/15  CMP14+EGFR  Result Value Ref Range   Glucose 86 65 - 99 mg/dL   BUN 15 8 - 27 mg/dL   Creatinine, Ser 0.91 0.76 - 1.27 mg/dL   GFR calc non Af Amer 91 >59 mL/min/1.73   GFR calc Af Amer 105 >59 mL/min/1.73   BUN/Creatinine Ratio 16 10 - 22   Sodium 142 136 - 144 mmol/L   Potassium 4.6 3.5 - 5.2 mmol/L   Chloride 101 97 - 106 mmol/L   CO2 26 18 - 29 mmol/L   Calcium 9.5 8.6 - 10.2 mg/dL   Total Protein 6.6 6.0 - 8.5 g/dL   Albumin 4.4 3.6 - 4.8 g/dL   Globulin, Total 2.2 1.5 - 4.5 g/dL   Albumin/Globulin Ratio 2.0 1.1 - 2.5   Bilirubin Total 0.5 0.0 - 1.2 mg/dL   Alkaline Phosphatase 76 39 - 117 IU/L   AST 26 0 - 40 IU/L   ALT 26 0 - 44 IU/L  Lipid panel  Result Value Ref Range   Cholesterol, Total 190 100 - 199 mg/dL   Triglycerides 83 0 - 149 mg/dL   HDL 68 >39 mg/dL   VLDL Cholesterol Cal 17 5 - 40 mg/dL   LDL Calculated 105 (H) 0 - 99 mg/dL   Chol/HDL Ratio 2.8 0.0 - 5.0 ratio units  TSH  Result Value Ref Range   TSH 3.760 0.450 - 4.500 uIU/mL    Testosterone,Free and Total  Result Value Ref Range   Testosterone 74 (L) 348 - 1197 ng/dL   Comment, Testosterone Comment    Testosterone, Free 2.2 (L) 6.6 - 18.1 pg/mL   I&D: Region was anesthetized with 2% lidocaine using about 82m of it. Incision was made on anterior medial aspect of the open wound. Significant serosanguineous and purulent drainage was exuded. Culture was taken. Forceps was used to probe the area and break apart any loculations.  Pressure  dressing was placed over top. Bleeding was minimal and patient tolerated procedure well.     Assessment & Plan:   Problem List Items Addressed This Visit      Cardiovascular and Mediastinum   Essential hypertension, benign   Relevant Medications   hydrochlorothiazide (MICROZIDE) 12.5 MG capsule    Other Visit Diagnoses    Dizzinesses    -  Primary    Concern for chest pain and pressure and dizziness. We'll refer to cardiology.    Cellulitis of leg, right        Relevant Medications    sulfamethoxazole-trimethoprim (BACTRIM DS,SEPTRA DS) 800-160 MG tablet        Follow up plan: Return in about 4 weeks (around 04/01/2015), or if symptoms worsen or fail to improve, for Hypertension and cellulitis follow-up.  Counseling provided for all of the vaccine components No orders of the defined types were placed in this encounter.    Caryl Pina, MD [facility] 03/04/2015, 11:23 AM

## 2015-03-08 LAB — ANAEROBIC AND AEROBIC CULTURE

## 2015-03-12 DIAGNOSIS — M4802 Spinal stenosis, cervical region: Secondary | ICD-10-CM | POA: Diagnosis not present

## 2015-03-12 DIAGNOSIS — M542 Cervicalgia: Secondary | ICD-10-CM | POA: Diagnosis not present

## 2015-03-13 DIAGNOSIS — G8929 Other chronic pain: Secondary | ICD-10-CM | POA: Diagnosis not present

## 2015-03-13 DIAGNOSIS — M961 Postlaminectomy syndrome, not elsewhere classified: Secondary | ICD-10-CM | POA: Diagnosis not present

## 2015-03-13 DIAGNOSIS — M5137 Other intervertebral disc degeneration, lumbosacral region: Secondary | ICD-10-CM | POA: Diagnosis not present

## 2015-03-13 DIAGNOSIS — F329 Major depressive disorder, single episode, unspecified: Secondary | ICD-10-CM | POA: Diagnosis not present

## 2015-03-13 DIAGNOSIS — Z79891 Long term (current) use of opiate analgesic: Secondary | ICD-10-CM | POA: Diagnosis not present

## 2015-04-03 ENCOUNTER — Ambulatory Visit (INDEPENDENT_AMBULATORY_CARE_PROVIDER_SITE_OTHER): Payer: Medicare Other | Admitting: "Endocrinology

## 2015-04-03 ENCOUNTER — Encounter: Payer: Self-pay | Admitting: "Endocrinology

## 2015-04-03 VITALS — BP 123/79 | HR 60 | Ht 71.0 in | Wt 224.0 lb

## 2015-04-03 DIAGNOSIS — E291 Testicular hypofunction: Secondary | ICD-10-CM | POA: Insufficient documentation

## 2015-04-03 NOTE — Progress Notes (Signed)
Subjective:    Patient ID: Jeff Stewart, male    DOB: [birth year], PCP Elige Radon Dettinger, MD   Past Medical History  Diagnosis Date  . Hypertension     borderline  . DDD (degenerative disc disease)     neck, lumbar  . Dyslipidemia     diet controlled  . Carpal tunnel syndrome, bilateral    Past Surgical History  Procedure Laterality Date  . Back surgery      x 6  . Nasal septum surgery      x 6  . Carpal tunnel release      bilateral  . Elbow surgery    . Knee arthroscopy    . Inguinal hernia repair Right    Social History   Social History  . Marital Status: Married    Spouse Name: N/A  . Number of Children: 3  . Years of Education: N/A   Occupational History  . [employer]     Retired   Social History Main Topics  . Smoking status: Never Smoker   . Smokeless tobacco: Never Used  . Alcohol Use: No  . Drug Use: No  . Sexual Activity: Yes    Birth Control/ Protection: Post-menopausal     Comment: married for 1972   Other Topics Concern  . None   Social History Narrative   Lives with wife    Outpatient Encounter Prescriptions as of 04/03/2015  Medication Sig  . hydrochlorothiazide (MICROZIDE) 12.5 MG capsule Take 1 capsule (12.5 mg total) by mouth daily.  Marland Kitchen morphine (MSIR) 30 MG tablet Take 30 mg by mouth at bedtime as needed.  Marland Kitchen morphine (ROXANOL) 20 MG/ML concentrated solution Take by mouth. pump  . sertraline (ZOLOFT) 50 MG tablet Take 1 tablet (50 mg total) by mouth daily.  . [DISCONTINUED] methocarbamol (ROBAXIN) 750 MG tablet Take 750 mg by mouth as needed.  . [DISCONTINUED] sulfamethoxazole-trimethoprim (BACTRIM DS,SEPTRA DS) 800-160 MG tablet Take 1 tablet by mouth 2 (two) times daily.   No facility-administered encounter medications on file as of 04/03/2015.   ALLERGIES: Allergies  Allergen Reactions  . Nubain [Nalbuphine Hcl] Rash   VACCINATION STATUS: Immunization History  Administered Date(s) Administered  .  Influenza,inj,Quad PF,36+ Mos 01/14/2015    HPI Jeff Stewart is 60 year old gentleman with above medical history. He is being seen in consultation for hypogonadism requested by his primary medical doctor Arville Care, M.D. He was known to have low testosterone for at least a year, treated with injectable testosterone until 4 months ago. His most recent testosterone was found to be low at 74. He complains of low libido associated with which directions. He also has fatigue and depression. He fathers 3 grown children. He denies history of head injury , has had nasal septal surgery multiple times. He denies injury to the testicles, exposure to chemotherapy, exposure to radiation to the genitals. However, due to chronic back injury and surgery he has exposure to heavy opioid therapy for pain control. He is currently on morphine pump. Overall on pain control for the last 10-15 years . He is interested to get worked up and get on testosterone replacement if necessary.  Review of Systems Constitutional: Intentional weight gain loss of 20 pounds over 6 month, + fatigue, no subjective hyperthermia/hypothermia Eyes: no blurry vision, no xerophthalmia ENT: no sore throat, no nodules palpated in throat, no dysphagia/odynophagia, no hoarseness Cardiovascular: no CP/SOB/palpitations/leg swelling Respiratory: no cough/SOB Gastrointestinal: no N/V/D/C Musculoskeletal: no muscle/joint aches Skin: no  rashes,  Genitourinary: He complains of weakness reactions and low libido Neurological: no tremors/numbness/tingling/dizziness Psychiatric: no depression/anxiety  Objective:    BP 123/79 mmHg  Pulse 60  Ht '5\' 11"'$  (1.803 m)  Wt 224 lb (101.606 kg)  BMI 31.26 kg/m2  SpO2 98%  Wt Readings from Last 3 Encounters:  04/03/15 224 lb (101.606 kg)  03/04/15 220 lb 9.6 oz (100.064 kg)  02/25/15 225 lb 6.4 oz (102.241 kg)    Physical Exam Constitutional: overweight, in NAD Eyes: PERRLA, EOMI, no  exophthalmos ENT: moist mucous membranes, no thyromegaly, no cervical lymphadenopathy Cardiovascular: RRR, No MRG Respiratory: CTA B Gastrointestinal: abdomen soft, NT, ND, BS+, rectal exam deferred (patient stated he had rectal exam last year by his primary medical doctor, reportedly no prostate enlargement) Genitourinary system: He has bilaterally shrunk testicles, right testes 12 mL, left testes 15 mL. Normal scrotal mass, normal venous, no inguinal lymphadenopathy. Musculoskeletal: Large extremities patient explains for medial, no deformities, strength intact in all 4 Skin: moist, warm, no rashes Neurological: no tremor with outstretched hands, DTR normal in all 4   Recent Results (from the past 2160 hour(s))  POCT UA - Microscopic Only     Status: None   Collection Time: 01/14/15  4:31 PM  Result Value Ref Range   WBC, Ur, HPF, POC 1-3    RBC, urine, microscopic neg    Bacteria, U Microscopic neg    Mucus, UA mod    Epithelial cells, urine per micros neg    Crystals, Ur, HPF, POC neg    Casts, Ur, LPF, POC neg    Yeast, UA neg   POCT urinalysis dipstick     Status: None   Collection Time: 01/14/15  4:31 PM  Result Value Ref Range   Color, UA gold    Clarity, UA clear    Glucose, UA neg    Bilirubin, UA small    Ketones, UA trace    Spec Grav, UA >=1.030    Blood, UA neg    pH, UA 5.0    Protein, UA neg    Urobilinogen, UA negative    Nitrite, UA neg    Leukocytes, UA Negative Negative  CMP14+EGFR     Status: None   Collection Time: 02/25/15  8:23 AM  Result Value Ref Range   Glucose 86 65 - 99 mg/dL   BUN 15 8 - 27 mg/dL   Creatinine, Ser 0.91 0.76 - 1.27 mg/dL   GFR calc non Af Amer 91 >59 mL/min/1.73   GFR calc Af Amer 105 >59 mL/min/1.73   BUN/Creatinine Ratio 16 10 - 22   Sodium 142 136 - 144 mmol/L    Comment: **Effective March 02, 2015 the reference interval**   for Sodium, Serum will be changing to:                                             134 -  144    Potassium 4.6 3.5 - 5.2 mmol/L   Chloride 101 97 - 106 mmol/L    Comment: **Effective March 02, 2015 the reference interval**   for Chloride, Serum will be changing to:  96 - 106    CO2 26 18 - 29 mmol/L   Calcium 9.5 8.6 - 10.2 mg/dL   Total Protein 6.6 6.0 - 8.5 g/dL   Albumin 4.4 3.6 - 4.8 g/dL   Globulin, Total 2.2 1.5 - 4.5 g/dL   Albumin/Globulin Ratio 2.0 1.1 - 2.5   Bilirubin Total 0.5 0.0 - 1.2 mg/dL   Alkaline Phosphatase 76 39 - 117 IU/L   AST 26 0 - 40 IU/L   ALT 26 0 - 44 IU/L  Lipid panel     Status: Abnormal   Collection Time: 02/25/15  8:23 AM  Result Value Ref Range   Cholesterol, Total 190 100 - 199 mg/dL   Triglycerides 83 0 - 149 mg/dL   HDL 68 >39 mg/dL   VLDL Cholesterol Cal 17 5 - 40 mg/dL   LDL Calculated 105 (H) 0 - 99 mg/dL   Chol/HDL Ratio 2.8 0.0 - 5.0 ratio units    Comment:                                   T. Chol/HDL Ratio                                             Men  Women                               1/2 Avg.Risk  3.4    3.3                                   Avg.Risk  5.0    4.4                                2X Avg.Risk  9.6    7.1                                3X Avg.Risk 23.4   11.0   TSH     Status: None   Collection Time: 02/25/15  8:23 AM  Result Value Ref Range   TSH 3.760 0.450 - 4.500 uIU/mL  Testosterone,Free and Total     Status: Abnormal   Collection Time: 02/25/15  8:23 AM  Result Value Ref Range   Testosterone 74 (L) 348 - 1197 ng/dL   Comment, Testosterone Comment     Comment: Adult male reference interval is based on a population of lean males up to 62 years old.    Testosterone, Free 2.2 (L) 6.6 - 18.1 pg/mL  Anaerobic and Aerobic Culture     Status: Abnormal   Collection Time: 03/04/15  5:04 PM  Result Value Ref Range   Anaerobic Culture Final report    Result 1 Comment     Comment: No anaerobic growth in 72 hours.   Aerobic Culture Final report (A)     Result 1 Staphylococcus aureus (A)     Comment: Scant growth Based on resistance to penicillin and susceptibility to oxacillin this isolate would be susceptible to: * Penicillinase-stable penicillins; such as:  Cloxacillin     Dicloxacillin     Nafcillin * Beta-lactam/beta-lactamase inhibitor combinations; such as:     Amoxicillin-clavulanic acid     Ampicillin-sulbactam * Antistaphylococcal cephems; such as:     Cefaclor     Cefuroxime * Antistaphylococcal carbapenems; such as:     Imipenem     Meropenem    Antimicrobial Susceptibility Comment     Comment:       ** S = Susceptible; I = Intermediate; R = Resistant **                    P = Positive; N = Negative             MICS are expressed in micrograms per mL    Antibiotic                 RSLT#1    RSLT#2    RSLT#3    RSLT#4 Ciprofloxacin                  S Clindamycin                    S Erythromycin                   S Gentamicin                     S Levofloxacin                   S Linezolid                      S Moxifloxacin                   S Oxacillin                      S Penicillin                     R Quinupristin/Dalfopristin      S Rifampin                       S Tetracycline                   S Trimethoprim/Sulfa             S Vancomycin                     S          Assessment & Plan:   1. Hypogonadism, male - I have reviewed his medical history and performed physical exam on this pleasant patient. -His most recent total testosterone is low at 74.  -No clear etiology so far. It is possible that it is multifactorial including exposure to heavy opioids, multiple back surgeries, prior inguinal surgery, or idiopathic.  However, no record of workup looking for an etiology of hypogonadism in this patient. His last exposure to testosterone injection was 4 months ago . This is a good time to obtain a full baseline profile of Gonadal  functional status. I would obtain the following on morning  sample of blood: - Testosterone, Free, Total, SHBG - Prolactin - FSH/LH - CBC with Differential/Platelet  -If workup suggests secondary hypogonadism he may need MRI of sella/pituitary gland. -If inconclusive and pointing towards primary hypogonadism, he will be reinitiated with testosterone replacement.   -  I advised patient to maintain close follow up with Worthy Rancher, MD for primary care needs. Follow up plan: Return in about 10 days (around 04/13/2015) for follow up with pre-visit labs, labs in AM tomorrow.  Glade Lloyd, MD Phone: [phone]  Fax: [phone]   04/03/2015, 10:15 AM

## 2015-04-04 LAB — CBC WITH DIFFERENTIAL/PLATELET
BASOS ABS: 0 10*3/uL (ref 0.0–0.1)
BASOS PCT: 0 % (ref 0–1)
EOS ABS: 0.1 10*3/uL (ref 0.0–0.7)
Eosinophils Relative: 2 % (ref 0–5)
HCT: 40.9 % (ref 39.0–52.0)
Hemoglobin: 13.6 g/dL (ref 13.0–17.0)
Lymphocytes Relative: 35 % (ref 12–46)
Lymphs Abs: 1.3 10*3/uL (ref 0.7–4.0)
MCH: 29.6 pg (ref 26.0–34.0)
MCHC: 33.3 g/dL (ref 30.0–36.0)
MCV: 89.1 fL (ref 78.0–100.0)
MONOS PCT: 9 % (ref 3–12)
MPV: 10.5 fL (ref 8.6–12.4)
Monocytes Absolute: 0.3 10*3/uL (ref 0.1–1.0)
NEUTROS PCT: 54 % (ref 43–77)
Neutro Abs: 1.9 10*3/uL (ref 1.7–7.7)
PLATELETS: 145 10*3/uL — AB (ref 150–400)
RBC: 4.59 MIL/uL (ref 4.22–5.81)
RDW: 13.6 % (ref 11.5–15.5)
WBC: 3.6 10*3/uL — ABNORMAL LOW (ref 4.0–10.5)

## 2015-04-04 LAB — PROLACTIN: Prolactin: 6.1 ng/mL (ref 2.1–17.1)

## 2015-04-04 LAB — FSH/LH
FSH: 9 m[IU]/mL (ref 1.4–18.1)
LH: 1.6 m[IU]/mL (ref 1.5–9.3)

## 2015-04-09 ENCOUNTER — Telehealth: Payer: Self-pay | Admitting: Family Medicine

## 2015-04-09 DIAGNOSIS — R002 Palpitations: Secondary | ICD-10-CM

## 2015-04-09 DIAGNOSIS — R079 Chest pain, unspecified: Secondary | ICD-10-CM

## 2015-04-09 LAB — TESTOS,TOTAL,FREE AND SHBG (FEMALE)
Sex Hormone Binding Glob.: 29 nmol/L (ref 22–77)
TESTOSTERONE,TOTAL,LC/MS/MS: 88 ng/dL — AB (ref 250–1100)
Testosterone, Free: 15.3 pg/mL — ABNORMAL LOW (ref 35.0–155.0)

## 2015-04-09 NOTE — Telephone Encounter (Signed)
Patient states that he thought he was suppose to ger referral to cardiologist

## 2015-04-09 NOTE — Telephone Encounter (Signed)
Patient aware referral placed.

## 2015-04-09 NOTE — Telephone Encounter (Signed)
Yes go ahead and put in referral to cardiology I must have forgotten that.

## 2015-04-14 ENCOUNTER — Other Ambulatory Visit: Payer: Self-pay | Admitting: Orthopaedic Surgery

## 2015-04-14 DIAGNOSIS — M4800 Spinal stenosis, site unspecified: Secondary | ICD-10-CM

## 2015-04-15 ENCOUNTER — Encounter: Payer: Self-pay | Admitting: "Endocrinology

## 2015-04-15 ENCOUNTER — Ambulatory Visit (INDEPENDENT_AMBULATORY_CARE_PROVIDER_SITE_OTHER): Payer: Medicare Other | Admitting: "Endocrinology

## 2015-04-15 VITALS — BP 132/79 | HR 78 | Ht 71.0 in | Wt 227.0 lb

## 2015-04-15 DIAGNOSIS — E291 Testicular hypofunction: Secondary | ICD-10-CM

## 2015-04-15 MED ORDER — TESTOSTERONE CYPIONATE 200 MG/ML IM SOLN: 100.0000 mg | INTRAMUSCULAR | Status: DC

## 2015-04-15 MED ORDER — "SYRINGE 21G X 1-1/4"" 3 ML MISC": 1.0000 | Status: DC

## 2015-04-15 NOTE — Progress Notes (Signed)
Subjective:    Patient ID: Jeff Stewart, male    DOB: 02/10/1956, PCP Fransisca Kaufmann Dettinger, MD   Past Medical History  Diagnosis Date  . Hypertension     borderline  . DDD (degenerative disc disease)     neck, lumbar  . Dyslipidemia     diet controlled  . Carpal tunnel syndrome, bilateral    Past Surgical History  Procedure Laterality Date  . Back surgery      x 6  . Nasal septum surgery      x 6  . Carpal tunnel release      bilateral  . Elbow surgery    . Knee arthroscopy    . Inguinal hernia repair Right    Social History   Social History  . Marital Status: Married    Spouse Name: N/A  . Number of Children: 3  . Years of Education: N/A   Occupational History  . [employer]ating [employer] [place]     Retired   Social History Main Topics  . Smoking status: Never Smoker   . Smokeless tobacco: Never Used  . Alcohol Use: No  . Drug Use: No  . Sexual Activity: Yes    Birth Control/ Protection: Post-menopausal     Comment: married for 1972   Other Topics Concern  . None   Social History Narrative   Lives with wife    Outpatient Encounter Prescriptions as of 04/15/2015  Medication Sig  . hydrochlorothiazide (MICROZIDE) 12.5 MG capsule Take 1 capsule (12.5 mg total) by mouth daily.  Marland Kitchen morphine (MSIR) 30 MG tablet Take 30 mg by mouth at bedtime as needed.  Marland Kitchen morphine (ROXANOL) 20 MG/ML concentrated solution Take by mouth. pump  . sertraline (ZOLOFT) 50 MG tablet Take 1 tablet (50 mg total) by mouth daily.  . Syringe/Needle, Disp, (SYRINGE 3CC/21GX1-1/4") 21G X 1-1/4" 3 ML MISC 1 each by Does not apply route once a week.  . testosterone cypionate (DEPOTESTOSTERONE CYPIONATE) 200 MG/ML injection Inject 0.5 mLs (100 mg total) into the muscle once a week.   No facility-administered encounter medications on file as of 04/15/2015.   ALLERGIES: Allergies  Allergen Reactions  . Nubain [Nalbuphine Hcl] Rash   VACCINATION STATUS: Immunization History  Administered Date(s)  Administered  . Influenza,inj,Quad PF,36+ Mos 01/14/2015    HPI Jeff Stewart is 59 year old gentleman with above medical history. He is here to follow-up with labs for hypogonadism.  He was known to have low testosterone for at least a year, treated with injectable testosterone until 4 months ago. His most recent testosterone was found to be low at 88. He complains of low libido associated with which directions. He also has fatigue and depression. He fathers 3 grown children. He denies history of head injury , has had nasal septal surgery multiple times. He denies injury to the testicles, exposure to chemotherapy, exposure to radiation to the genitals. However, due to chronic back injury and surgery he has exposure to heavy opioid therapy for pain control. He is currently on morphine pump. Overall on pain control for the last 10-15 years . He is interested to get worked up and get on testosterone replacement if necessary.  Review of Systems Constitutional: Intentional weight gain loss of 20 pounds over 6 month, + fatigue, no subjective hyperthermia/hypothermia Eyes: no blurry vision, no xerophthalmia ENT: no sore throat, no nodules palpated in throat, no dysphagia/odynophagia, no hoarseness Cardiovascular: no CP/SOB/palpitations/leg swelling Respiratory: no cough/SOB Gastrointestinal: no N/V/D/C Musculoskeletal: no muscle/joint aches  Skin: no rashes,  Genitourinary: He complains of weakness reactions and low libido Neurological: no tremors/numbness/tingling/dizziness Psychiatric: no depression/anxiety  Objective:    BP 132/79 mmHg  Pulse 78  Ht _0  (1.803 m)  Wt 227 lb (102.967 kg)  BMI 31.67 kg/m2  SpO2 95%  Wt Readings from Last 3 Encounters:  04/15/15 227 lb (102.967 kg)  04/03/15 224 lb (101.606 kg)  03/04/15 220 lb 9.6 oz (100.064 kg)    Physical Exam   Constitutional: overweight, in NAD Eyes: PERRLA, EOMI, no exophthalmos ENT: moist mucous membranes, no thyromegaly, no  cervical lymphadenopathy Cardiovascular: RRR, No MRG Respiratory: CTA B Gastrointestinal: abdomen soft, NT, ND, BS+, rectal exam deferred (patient stated he had rectal exam last year by his primary medical doctor, reportedly no prostate enlargement) Genitourinary system: He has bilaterally shrunk testicles, right testes 12 mL, left testes 15 mL. Normal scrotal mass, normal venous, no inguinal lymphadenopathy. Musculoskeletal: Large extremities patient explains for medial, no deformities, strength intact in all 4 Skin: moist, warm, no rashes Neurological: no tremor with outstretched hands, DTR normal in all 4   Recent Results (from the past 2160 hour(s))  CMP14+EGFR     Status: None   Collection Time: 02/25/15  8:23 AM  Result Value Ref Range   Glucose 86 65 - 99 mg/dL   BUN 15 8 - 27 mg/dL   Creatinine, Ser 0.91 0.76 - 1.27 mg/dL   GFR calc non Af Amer 91 >59 mL/min/1.73   GFR calc Af Amer 105 >59 mL/min/1.73   BUN/Creatinine Ratio 16 10 - 22   Sodium 142 136 - 144 mmol/L    Comment: **Effective March 02, 2015 the reference interval**   for Sodium, Serum will be changing to:                                             134 - 144    Potassium 4.6 3.5 - 5.2 mmol/L   Chloride 101 97 - 106 mmol/L    Comment: **Effective March 02, 2015 the reference interval**   for Chloride, Serum will be changing to:                                              96 - 106    CO2 26 18 - 29 mmol/L   Calcium 9.5 8.6 - 10.2 mg/dL   Total Protein 6.6 6.0 - 8.5 g/dL   Albumin 4.4 3.6 - 4.8 g/dL   Globulin, Total 2.2 1.5 - 4.5 g/dL   Albumin/Globulin Ratio 2.0 1.1 - 2.5   Bilirubin Total 0.5 0.0 - 1.2 mg/dL   Alkaline Phosphatase 76 39 - 117 IU/L   AST 26 0 - 40 IU/L   ALT 26 0 - 44 IU/L  Lipid panel     Status: Abnormal   Collection Time: 02/25/15  8:23 AM  Result Value Ref Range   Cholesterol, Total 190 100 - 199 mg/dL   Triglycerides 83 0 - 149 mg/dL   HDL 68 >39 mg/dL   VLDL Cholesterol  Cal 17 5 - 40 mg/dL   LDL Calculated 105 (H) 0 - 99 mg/dL   Chol/HDL Ratio 2.8 0.0 - 5.0 ratio units    Comment:  T. Chol/HDL Ratio                                             Men  Women                               1/2 Avg.Risk  3.4    3.3                                   Avg.Risk  5.0    4.4                                2X Avg.Risk  9.6    7.1                                3X Avg.Risk 23.4   11.0   TSH     Status: None   Collection Time: 02/25/15  8:23 AM  Result Value Ref Range   TSH 3.760 0.450 - 4.500 uIU/mL  Testosterone,Free and Total     Status: Abnormal   Collection Time: 02/25/15  8:23 AM  Result Value Ref Range   Testosterone 74 (L) 348 - 1197 ng/dL   Comment, Testosterone Comment     Comment: Adult male reference interval is based on a population of lean males up to 62 years old.    Testosterone, Free 2.2 (L) 6.6 - 18.1 pg/mL  Anaerobic and Aerobic Culture     Status: Abnormal   Collection Time: 03/04/15  5:04 PM  Result Value Ref Range   Anaerobic Culture Final report    Result 1 Comment     Comment: No anaerobic growth in 72 hours.   Aerobic Culture Final report (A)    Result 1 Staphylococcus aureus (A)     Comment: Scant growth Based on resistance to penicillin and susceptibility to oxacillin this isolate would be susceptible to: * Penicillinase-stable penicillins; such as:     Cloxacillin     Dicloxacillin     Nafcillin * Beta-lactam/beta-lactamase inhibitor combinations; such as:     Amoxicillin-clavulanic acid     Ampicillin-sulbactam * Antistaphylococcal cephems; such as:     Cefaclor     Cefuroxime * Antistaphylococcal carbapenems; such as:     Imipenem     Meropenem    Antimicrobial Susceptibility Comment     Comment:       ** S = Susceptible; I = Intermediate; R = Resistant **                    P = Positive; N = Negative             MICS are expressed in micrograms per mL    Antibiotic                  RSLT#1    RSLT#2    RSLT#3    RSLT#4 Ciprofloxacin                  S Clindamycin  S Erythromycin                   S Gentamicin                     S Levofloxacin                   S Linezolid                      S Moxifloxacin                   S Oxacillin                      S Penicillin                     R Quinupristin/Dalfopristin      S Rifampin                       S Tetracycline                   S Trimethoprim/Sulfa             S Vancomycin                     S   Prolactin     Status: None   Collection Time: 04/03/15  9:58 AM  Result Value Ref Range   Prolactin 6.1 2.1 - 17.1 ng/mL    Comment:      Reference Ranges:                  Male:                       2.1 -  17.1 ng/ml                  Male:   Pregnant          9.7 - 208.5 ng/mL                            Non Pregnant      2.8 -  29.2 ng/mL                            Post Menopausal   1.8 -  20.3 ng/mL                      FSH/LH     Status: None   Collection Time: 04/03/15  9:58 AM  Result Value Ref Range   FSH 9.0 1.4 - 18.1 mIU/mL    Comment: Reference Ranges:          Male:                         1.4 -  18.1 mIU/mL          Male:   Follicular Phase    2.5 -  10.2 mIU/mL                    MidCycle Peak       3.4 -  33.4 mIU/mL  Luteal Phase        1.5 -   9.1 mIU/mL                    Post Menopausal    23.0 - 116.3 mIU/mL                    Pregnant                <   0.3 mIU/mL    LH 1.6 1.5 - 9.3 mIU/mL    Comment: Reference Ranges:          Male:     20 - 70 Years           1.5 -  9.3 mIU/mL                       > 70 Years           3.1 - 34.6 mIU/mL          Male:   Follicular Phase        1.9 - 12.5 mIU/mL                    Midcycle                8.7 - 76.3 mIU/mL                    Luteal Phase            0.5 - 16.9 mIU/mL                    Post Menopausal        15.9 - 54.0 mIU/mL                    Pregnant                    <  1.5  mIU/mL                    Contraceptives          0.7 -  5.6 mIU/mL          Children:                             <  6.0 mIU/mL     CBC with Differential/Platelet     Status: Abnormal   Collection Time: 04/03/15  9:58 AM  Result Value Ref Range   WBC 3.6 (L) 4.0 - 10.5 K/uL   RBC 4.59 4.22 - 5.81 MIL/uL   Hemoglobin 13.6 13.0 - 17.0 g/dL   HCT 40.9 39.0 - 52.0 %   MCV 89.1 78.0 - 100.0 fL   MCH 29.6 26.0 - 34.0 pg   MCHC 33.3 30.0 - 36.0 g/dL   RDW 13.6 11.5 - 15.5 %   Platelets 145 (L) 150 - 400 K/uL   MPV 10.5 8.6 - 12.4 fL   Neutrophils Relative % 54 43 - 77 %   Neutro Abs 1.9 1.7 - 7.7 K/uL   Lymphocytes Relative 35 12 - 46 %   Lymphs Abs 1.3 0.7 - 4.0 K/uL   Monocytes Relative 9 3 - 12 %   Monocytes Absolute 0.3 0.1 - 1.0 K/uL   Eosinophils Relative 2 0 - 5 %   Eosinophils Absolute 0.1 0.0 -  0.7 K/uL   Basophils Relative 0 0 - 1 %   Basophils Absolute 0.0 0.0 - 0.1 K/uL   Smear Review Criteria for review not met   Testos,Total,Free and SHBG (Male)     Status: Abnormal   Collection Time: 04/03/15  9:58 AM  Result Value Ref Range   Testosterone,Total,LC/MS/MS 88 (L) 250 - 1100 ng/dL    Comment: Men with clinically significant hypogonadal symptoms and testosterone values repeatedly in the range of the 200-300 ng/dL or less, may benefit from testosterone treatment after adequate risk and benefits counseling. For more information on this test, go to http://education.[facility].com/faq/ TotalTestosteroneLCMSMS    Testosterone, Free 15.3 (L) 35.0 - 155.0 pg/mL   Sex Hormone Binding Glob. 29 22 - 77 nmol/L     Assessment & Plan:   1. Hypogonadism, male  -His most recent total testosterone is still low at 68 , has been low at 74 prior to his last visit.  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  -It is possible that it is multifactorial including exposure to heavy opioids, multiple  back surgeries, prior inguinal surgery, or idiopathic.  -Prolactin level is favorable at 6.1, he would not need pituitary/sella imaging for now.  -He would benefit from continued replacement of testosterone. -We discussed his options and decided on injectable testosterone replacement.  -I will initiate 100 mg of testosterone cypionate IM weekly for target testosterone level of 400-600 mcg/dL.  -He will return in 3 months with lab work for CBC total testosterone, transaminases . -He is advised to maintain his annual physical with his primary medical doctor and obtain PSA.    - I advised patient to maintain close follow up with Worthy Rancher, MD for primary care needs. Follow up plan: Return in about 3 months (around 07/14/2015) for follow up with pre-visit labs.  Glade Lloyd, MD Phone: [phone]  Fax: [phone]   04/15/2015, 12:56 PM

## 2015-04-18 ENCOUNTER — Inpatient Hospital Stay: Admission: RE | Admit: 2015-04-18 | Payer: Medicare Other | Source: Ambulatory Visit

## 2015-04-28 ENCOUNTER — Ambulatory Visit
Admission: RE | Admit: 2015-04-28 | Discharge: 2015-04-28 | Disposition: A | Discharge status: Home / Self Care | Payer: Medicare Other | Source: Ambulatory Visit | Attending: Orthopaedic Surgery | Admitting: Orthopaedic Surgery

## 2015-04-28 DIAGNOSIS — M4800 Spinal stenosis, site unspecified: Secondary | ICD-10-CM

## 2015-04-28 DIAGNOSIS — M50223 Other cervical disc displacement at C6-C7 level: Secondary | ICD-10-CM | POA: Diagnosis not present

## 2015-04-30 DIAGNOSIS — M5000 Cervical disc disorder with myelopathy, unspecified cervical region: Secondary | ICD-10-CM | POA: Diagnosis not present

## 2015-04-30 DIAGNOSIS — M7552 Bursitis of left shoulder: Secondary | ICD-10-CM | POA: Diagnosis not present

## 2015-05-04 ENCOUNTER — Other Ambulatory Visit: Payer: Self-pay | Admitting: Family Medicine

## 2015-05-04 ENCOUNTER — Encounter: Payer: Self-pay | Admitting: Cardiology

## 2015-05-04 ENCOUNTER — Ambulatory Visit (INDEPENDENT_AMBULATORY_CARE_PROVIDER_SITE_OTHER): Payer: Medicare Other | Admitting: Cardiology

## 2015-05-04 VITALS — BP 134/82 | HR 61 | Ht 73.0 in | Wt 224.6 lb

## 2015-05-04 DIAGNOSIS — R079 Chest pain, unspecified: Secondary | ICD-10-CM

## 2015-05-04 MED ORDER — LOSARTAN POTASSIUM 50 MG PO TABS: 50.0000 mg | ORAL_TABLET | Freq: Every day | ORAL | Status: DC

## 2015-05-04 NOTE — Patient Instructions (Addendum)
Medication Instructions: Dr Percival Spanish has recommended making the following medication changes: STOP HCTZ START Losartan 50 mg - take 1 tablet (50 mg total) by mouth daily  Follow-up: Dr Percival Spanish recommends that you schedule a follow-up appointment in 6 weeks in [place].  If you need a refill on your cardiac medications before your next appointment, please call your pharmacy.

## 2015-05-04 NOTE — Progress Notes (Signed)
HPI The patient presents for evaluation of chest pain and arm pain.  I saw him in the past for this.  He had a negative POET (Plain Old Exercise Treadmill).  He presents follow up of chest pain.  He reports that he was at [facility] with chest pain and HTN recently.  He seems to describe a negative Lexiscan Myoview.  I don't yet have these records.   He reports that his symptoms start when he with headache.  He checks his BP and it will be 99991111 systolic.  It typically is in the 160 range.  He then gets left axillary and left breast pain with left arm pain.  This last for hours and he has to lie down.  He then is fatigued for days afterward.  This happens sometimes after he has had a busy day around the house but not while he is doing the activity.  He does have some dyspnea.  He has poorly treated sleep apnea and has fatigue related to this. We will seeeight gain. He was having some swelling but this is not from a longer period   Allergies  Allergen Reactions  . Nubain [Nalbuphine Hcl] Rash    Current Outpatient Prescriptions  Medication Sig Dispense Refill  . hydrochlorothiazide (MICROZIDE) 12.5 MG capsule Take 1 capsule (12.5 mg total) by mouth daily. 30 capsule 1  . morphine (MSIR) 30 MG tablet Take 30 mg by mouth at bedtime as needed.    Marland Kitchen morphine (ROXANOL) 20 MG/ML concentrated solution Take by mouth. pump    . omeprazole (PRILOSEC) 20 MG capsule Take 20 mg by mouth as needed.    . sertraline (ZOLOFT) 50 MG tablet Take 1 tablet (50 mg total) by mouth daily. 30 tablet 0  . Syringe/Needle, Disp, (SYRINGE 3CC/21GX1-1/4") 21G X 1-1/4" 3 ML MISC 1 each by Does not apply route once a week. 50 each 1  . testosterone cypionate (DEPOTESTOSTERONE CYPIONATE) 200 MG/ML injection Inject 0.5 mLs (100 mg total) into the muscle once a week. 10 mL 1   No current facility-administered medications for this visit.    Past Medical History  Diagnosis Date  . Hypertension     borderline  . DDD  (degenerative disc disease)     neck, lumbar  . Dyslipidemia     diet controlled  . Carpal tunnel syndrome, bilateral     Past Surgical History  Procedure Laterality Date  . Back surgery      x 6  . Nasal septum surgery      x 6  . Carpal tunnel release      bilateral  . Elbow surgery    . Knee arthroscopy    . Inguinal hernia repair Right     ROS:  Positive for nausea.  Otherwise as stated in the HPI and negative for all other systems.  PHYSICAL EXAM BP 134/82 mmHg  Pulse 61  Ht 6\' 1"  (1.854 m)  Wt 224 lb 9.6 oz (101.878 kg)  BMI 29.64 kg/m2 GENERAL:  Well appearing NECK:  No jugular venous distention, waveform within normal limits, carotid upstroke brisk and symmetric, no bruits, no thyromegaly LUNGS:  Clear to auscultation bilaterally BACK:  No CVA tenderness CHEST:  Unremarkable HEART:  PMI not displaced or sustained,S1 and S2 within normal limits, no S3, no S4, no clicks, no rubs, no murmurs ABD:  Flat, positive bowel sounds normal in frequency in pitch, no bruits, no rebound, no guarding, no midline pulsatile mass, no hepatomegaly, no splenomegaly,  morphine pump EXT:  2 plus pulses throughout, no edema, no cyanosis no clubbing   EKG:  Sinus rhythm, rate 61, leftward axis, intervals within normal limits, no acute ST-T wave changes. 05/04/2015  ASSESSMENT AND PLAN  NECK/SHOULDER PAIN:  I will review the outside records from [facility]. If indeed he had a negative stress perfusion study I don't think that further testing would be indicated.  HTN:  His BP is not controlled.  I will stop HCTZ and start Cozaar 50 mg daily.  He will then keep a BP diary.  Further med changes will be based on this.

## 2015-05-08 ENCOUNTER — Telehealth: Payer: Self-pay

## 2015-05-08 NOTE — Telephone Encounter (Signed)
Faxed signed release to [facility] - to obtain records per Dr Hochrein's request. Faxed on 05/08/2015.

## 2015-05-19 ENCOUNTER — Telehealth: Payer: Self-pay | Admitting: Cardiology

## 2015-05-19 NOTE — Telephone Encounter (Signed)
Received records from [facility] as requested by Dr Percival Spanish for appointment on 06/10/15.  Records given to [facility] (medical records) for Dr Hochrein's schedule on 06/10/15. lp

## 2015-06-05 DIAGNOSIS — M5137 Other intervertebral disc degeneration, lumbosacral region: Secondary | ICD-10-CM | POA: Diagnosis not present

## 2015-06-05 DIAGNOSIS — G8929 Other chronic pain: Secondary | ICD-10-CM | POA: Diagnosis not present

## 2015-06-05 DIAGNOSIS — M961 Postlaminectomy syndrome, not elsewhere classified: Secondary | ICD-10-CM | POA: Diagnosis not present

## 2015-06-05 DIAGNOSIS — Z79891 Long term (current) use of opiate analgesic: Secondary | ICD-10-CM | POA: Diagnosis not present

## 2015-06-10 ENCOUNTER — Encounter: Payer: Self-pay | Admitting: Cardiology

## 2015-06-10 ENCOUNTER — Ambulatory Visit (INDEPENDENT_AMBULATORY_CARE_PROVIDER_SITE_OTHER): Payer: Medicare Other | Admitting: Cardiology

## 2015-06-10 VITALS — BP 148/99 | HR 64 | Ht 73.0 in | Wt 237.0 lb

## 2015-06-10 DIAGNOSIS — R0600 Dyspnea, unspecified: Secondary | ICD-10-CM | POA: Diagnosis not present

## 2015-06-10 DIAGNOSIS — R0602 Shortness of breath: Secondary | ICD-10-CM

## 2015-06-10 NOTE — Progress Notes (Signed)
HPI The patient presents for evaluation of chest pain and arm pain.  I saw him in the past for this.  He had a negative POET (Plain Old Exercise Treadmill).  He presented for follow up of chest pain at the last visit.  He was at [facility] with chest pain and HTN.  He had a [employer].  I was able to review these records today. He did have a negative stress test. At the last visit he was hypertensive. I started Cozaar. He actually has done well with this he brings a blood pressure diary which I reviewed. His blood pressures have been better averaging less than 140 with very few readings above this and no significant hypertension like he was having. He's not having any of symptoms he was getting when he had some more hypertension. He does still have some shortness of breath with activities such as climbing stairs. This is chronic problem and didn't get better despite weight loss over the years. He's not describing PND or orthopnea. He's not having any palpitations, presyncope or syncope. He's not having any swelling.  Allergies  Allergen Reactions  . Nubain [Nalbuphine Hcl] Rash    Current Outpatient Prescriptions  Medication Sig Dispense Refill  . hydrochlorothiazide (MICROZIDE) 12.5 MG capsule Take 1 capsule (12.5 mg total) by mouth daily. 30 capsule 3  . morphine (MSIR) 30 MG tablet Take 30 mg by mouth at bedtime as needed.    Marland Kitchen morphine (ROXANOL) 20 MG/ML concentrated solution Take by mouth. pump    . omeprazole (PRILOSEC) 20 MG capsule Take 20 mg by mouth as needed.    . sertraline (ZOLOFT) 100 MG tablet Take 100 mg by mouth daily.     Marland Kitchen testosterone cypionate (DEPOTESTOSTERONE CYPIONATE) 200 MG/ML injection Inject 0.5 mLs (100 mg total) into the muscle once a week. 10 mL 1  . Syringe/Needle, Disp, (SYRINGE 3CC/21GX1-1/4") 21G X 1-1/4" 3 ML MISC 1 each by Does not apply route once a week. 50 each 1   No current facility-administered medications for this visit.    Past Medical History   Diagnosis Date  . Hypertension     borderline  . DDD (degenerative disc disease)     neck, lumbar  . Dyslipidemia     diet controlled  . Carpal tunnel syndrome, bilateral   . Sleep apnea     CPAP    Past Surgical History  Procedure Laterality Date  . Back surgery      x 6  . Nasal septum surgery      x 6  . Carpal tunnel release      bilateral  . Elbow surgery    . Knee arthroscopy    . Inguinal hernia repair Right     ROS:  As stated in the HPI and negative for all other systems.  PHYSICAL EXAM BP 148/99 mmHg  Pulse 64  Ht 6\' 1"  (1.854 m)  Wt 237 lb (107.502 kg)  BMI 31.27 kg/m2 GENERAL:  Well appearing NECK:  No jugular venous distention, waveform within normal limits, carotid upstroke brisk and symmetric, no bruits, no thyromegaly LUNGS:  Clear to auscultation bilaterally BACK:  No CVA tenderness CHEST:  Unremarkable HEART:  PMI not displaced or sustained,S1 and S2 within normal limits, no S3, no S4, no clicks, no rubs, no murmurs ABD:  Flat, positive bowel sounds normal in frequency in pitch, no bruits, no rebound, no guarding, no midline pulsatile mass, no hepatomegaly, no splenomegaly, morphine pump EXT:  2  plus pulses throughout, no edema, no cyanosis no clubbing    ASSESSMENT AND PLAN  NECK/SHOULDER PAIN:  I did review the outside records from [facility].  He had a negative stress test is getting no other symptoms of chest discomfort. No further cardiac workup is planned.  DYSPNEA:  I will check pulmonary function testing.  HTN:  His BP is controlled.  He will continue the meds as listed. I reviewed his blood pressure diary.

## 2015-06-10 NOTE — Patient Instructions (Signed)
Medication Instructions:  The current medical regimen is effective;  continue present plan and medications.  Testing/Procedures: Your physician has recommended that you have a pulmonary function test. Pulmonary Function Tests are a group of tests that measure how well air moves in and out of your lungs.  Follow-Up: Follow up in 6 months with Dr. Percival Spanish.  You will receive a letter in the mail 2 months before you are due.  Please call us when you receive this letter to schedule your follow up appointment.  If you need a refill on your cardiac medications before your next appointment, please call your pharmacy.  Thank you for choosing [facility]!!

## 2015-06-15 ENCOUNTER — Other Ambulatory Visit: Payer: Self-pay | Admitting: *Deleted

## 2015-06-15 MED ORDER — OMEPRAZOLE 20 MG PO CPDR: 20.0000 mg | DELAYED_RELEASE_CAPSULE | ORAL | Status: DC | PRN

## 2015-06-17 ENCOUNTER — Other Ambulatory Visit: Payer: Self-pay | Admitting: *Deleted

## 2015-06-17 MED ORDER — OMEPRAZOLE 20 MG PO CPDR: 20.0000 mg | DELAYED_RELEASE_CAPSULE | Freq: Two times a day (BID) | ORAL | Status: DC

## 2015-06-30 DIAGNOSIS — M7552 Bursitis of left shoulder: Secondary | ICD-10-CM | POA: Diagnosis not present

## 2015-06-30 DIAGNOSIS — I10 Essential (primary) hypertension: Secondary | ICD-10-CM | POA: Diagnosis not present

## 2015-06-30 DIAGNOSIS — M4322 Fusion of spine, cervical region: Secondary | ICD-10-CM | POA: Diagnosis not present

## 2015-06-30 DIAGNOSIS — Z6829 Body mass index (BMI) 29.0-29.9, adult: Secondary | ICD-10-CM | POA: Diagnosis not present

## 2015-07-17 ENCOUNTER — Ambulatory Visit: Payer: Medicare Other | Admitting: "Endocrinology

## 2015-07-21 ENCOUNTER — Other Ambulatory Visit: Payer: Self-pay | Admitting: "Endocrinology

## 2015-07-21 DIAGNOSIS — E291 Testicular hypofunction: Secondary | ICD-10-CM | POA: Diagnosis not present

## 2015-07-21 LAB — CBC WITH DIFFERENTIAL/PLATELET
BASOS ABS: 0 {cells}/uL (ref 0–200)
Basophils Relative: 0 %
EOS ABS: 98 {cells}/uL (ref 15–500)
Eosinophils Relative: 2 %
HCT: 46.2 % (ref 38.5–50.0)
Hemoglobin: 15.3 g/dL (ref 13.2–17.1)
LYMPHS PCT: 29 %
Lymphs Abs: 1421 cells/uL (ref 850–3900)
MCH: 29.5 pg (ref 27.0–33.0)
MCHC: 33.1 g/dL (ref 32.0–36.0)
MCV: 89.2 fL (ref 80.0–100.0)
MONOS PCT: 9 %
MPV: 10.4 fL (ref 7.5–12.5)
Monocytes Absolute: 441 cells/uL (ref 200–950)
NEUTROS ABS: 2940 {cells}/uL (ref 1500–7800)
NEUTROS PCT: 60 %
PLATELETS: 145 10*3/uL (ref 140–400)
RBC: 5.18 MIL/uL (ref 4.20–5.80)
RDW: 13 % (ref 11.0–15.0)
WBC: 4.9 10*3/uL (ref 3.8–10.8)

## 2015-07-21 LAB — HEPATIC FUNCTION PANEL
ALBUMIN: 4.1 g/dL (ref 3.6–5.1)
ALK PHOS: 73 U/L (ref 40–115)
ALT: 15 U/L (ref 9–46)
AST: 17 U/L (ref 10–35)
Bilirubin, Direct: 0.1 mg/dL (ref <=0.2)
Indirect Bilirubin: 0.4 mg/dL (ref 0.2–1.2)
TOTAL PROTEIN: 6 g/dL — AB (ref 6.1–8.1)
Total Bilirubin: 0.5 mg/dL (ref 0.2–1.2)

## 2015-07-22 LAB — TESTOSTERONE, FREE AND TOTAL (INCLUDES SHBG)-(MALES)
SEX HORMONE BINDING: 26 nmol/L (ref 22–77)
TESTOSTERONE FREE: 67.6 pg/mL (ref 47.0–244.0)
TESTOSTERONE: 301 ng/dL (ref 250–827)
Testosterone-% Free: 2.2 % (ref 1.6–2.9)

## 2015-08-03 ENCOUNTER — Other Ambulatory Visit: Payer: Self-pay | Admitting: *Deleted

## 2015-08-03 DIAGNOSIS — R0600 Dyspnea, unspecified: Secondary | ICD-10-CM

## 2015-08-04 ENCOUNTER — Encounter: Payer: Self-pay | Admitting: Family Medicine

## 2015-08-04 ENCOUNTER — Ambulatory Visit (INDEPENDENT_AMBULATORY_CARE_PROVIDER_SITE_OTHER): Payer: Medicare Other | Admitting: Family Medicine

## 2015-08-04 VITALS — BP 154/89 | HR 86 | Temp 98.2°F | Ht 73.0 in | Wt 237.6 lb

## 2015-08-04 DIAGNOSIS — K649 Unspecified hemorrhoids: Secondary | ICD-10-CM | POA: Diagnosis not present

## 2015-08-04 MED ORDER — HYDROCORTISONE ACETATE 25 MG RE SUPP: 25.0000 mg | Freq: Four times a day (QID) | RECTAL | Status: DC | PRN

## 2015-08-04 MED ORDER — CIPROFLOXACIN HCL 500 MG PO TABS: 500.0000 mg | ORAL_TABLET | Freq: Two times a day (BID) | ORAL | Status: DC

## 2015-08-04 NOTE — Patient Instructions (Signed)
Metamucil 1 tbsp Twice a day for treatment and prevention

## 2015-08-04 NOTE — Progress Notes (Signed)
Subjective:  Patient ID: Jeff Stewart, male    DOB: 1955/10/08  Age: 60 y.o. MRN: [id number]  CC: Rectal Pain   HPI Jeff Stewart presents for nodule noted at rectum. Painful. No bleeding first noted 1 week ago. No previous history. No relief with hot baths, Prep H or vaseline.  Denies NVD and constipation.    History Jeff Stewart has a past medical history of Hypertension; DDD (degenerative disc disease); Dyslipidemia; Carpal tunnel syndrome, bilateral; and Sleep apnea.   He has past surgical history that includes Back surgery; Nasal septum surgery; Carpal tunnel release; Elbow surgery; Knee arthroscopy; and Inguinal hernia repair (Right).   His family history includes CAD in his brother; CAD (age of onset: 92) in his brother; CAD (age of onset: 67) in his father; Emphysema in his father; Stroke (age of onset: 80) in his mother.He reports that he has never smoked. He has never used smokeless tobacco. He reports that he does not drink alcohol or use illicit drugs.    ROS Review of Systems  Constitutional: Negative for fever, chills, diaphoresis and unexpected weight change.  HENT: Negative for congestion, hearing loss, rhinorrhea and sore throat.   Eyes: Negative for visual disturbance.  Respiratory: Negative for cough and shortness of breath.   Cardiovascular: Negative for chest pain.  Gastrointestinal: Negative for abdominal pain, diarrhea and constipation.  Genitourinary: Negative for dysuria and flank pain.  Musculoskeletal: Negative for joint swelling and arthralgias.  Skin: Negative for rash.  Neurological: Negative for dizziness and headaches.  Psychiatric/Behavioral: Negative for sleep disturbance and dysphoric mood.    Objective:  BP 154/89 mmHg  Pulse 86  Temp(Src) 98.2 F (36.8 C) (Oral)  Ht 6\' 1"  (1.854 m)  Wt 237 lb 9.6 oz (107.775 kg)  BMI 31.35 kg/m2  SpO2 98%  BP Readings from Last 3 Encounters:  08/04/15 154/89  06/10/15 148/99  05/04/15 134/82     Wt Readings from Last 3 Encounters:  08/04/15 237 lb 9.6 oz (107.775 kg)  06/10/15 237 lb (107.502 kg)  05/04/15 224 lb 9.6 oz (101.878 kg)     Physical Exam  Constitutional: He appears well-developed and well-nourished.  HENT:  Head: Normocephalic and atraumatic.  Right Ear: Tympanic membrane and external ear normal. No decreased hearing is noted.  Left Ear: Tympanic membrane and external ear normal. No decreased hearing is noted.  Mouth/Throat: No oropharyngeal exudate or posterior oropharyngeal erythema.  Eyes: Pupils are equal, round, and reactive to light.  Neck: Normal range of motion. Neck supple.  Cardiovascular: Normal rate and regular rhythm.   No murmur heard. Pulmonary/Chest: Breath sounds normal. No respiratory distress.  Abdominal: Soft. Bowel sounds are normal. He exhibits no mass. There is no tenderness.  Genitourinary: Prostate normal. Rectal exam shows external hemorrhoid (4X8 mm just inside anal verge, below pectinate line) and tenderness. Rectal exam shows no internal hemorrhoid, no fissure, no mass and anal tone normal. Guaiac negative stool.  Vitals reviewed.    Lab Results  Component Value Date   WBC 4.9 07/21/2015   HGB 15.3 07/21/2015   HCT 46.2 07/21/2015   PLT 145 07/21/2015   GLUCOSE 86 02/25/2015   CHOL 190 02/25/2015   TRIG 83 02/25/2015   HDL 68 02/25/2015   LDLCALC 105* 02/25/2015   ALT 15 07/21/2015   AST 17 07/21/2015   NA 142 02/25/2015   K 4.6 02/25/2015   CL 101 02/25/2015   CREATININE 0.91 02/25/2015   BUN 15 02/25/2015   CO2 26  02/25/2015   TSH 3.760 02/25/2015    Mr Cervical Spine Wo Contrast  04/28/2015  CLINICAL DATA:  Posterior left-sided neck pain extending into the left arm for 6 months. Occasional right arm pain. History of cervical fusion 1 year ago. EXAM: MRI CERVICAL SPINE WITHOUT CONTRAST TECHNIQUE: Multiplanar, multisequence MR imaging of the cervical spine was performed. No intravenous contrast was administered.  COMPARISON:  Radiographs 03/20/2014.  CT 10/01/2012. FINDINGS: Alignment: Normal. Bones: Status post C3-5 ACDF. The hardware appears well positioned. No acute osseous findings are apparent. Cord: Normal in signal and caliber. Posterior Fossa: Visualized portions unremarkable. Vertebral Arteries: Bilateral vertebral artery flow voids. Paraspinal tissues: Unremarkable. Disc levels: There is some motion artifact on the axial images, especially the gradient echo images (series 6). C2-3:  No significant findings. C3-4: Status post ACDF. Previously demonstrated uncinate spurring appears improved. There is no cord deformity or foraminal compromise. C4-5: Status post ACDF. There is some residual foraminal narrowing bilaterally due to uncinate spurring. No cord deformity. C5-6: Stable mild disc bulging. No cord deformity or significant foraminal compromise. C6-7: Shallow central disc protrusion without cord deformity or foraminal compromise. C7-T1: Bilateral facet hypertrophy, worse on the left with asymmetric uncinate spurring on the left. The left foramen appears only mildly narrowed without definite C8 nerve root encroachment. There is no cord deformity. IMPRESSION: 1. Interval C3-5 ACDF without demonstrated complication. There is mild residual foraminal narrowing bilaterally at C4-5. 2. Shallow central disc protrusion at C6-7 without cord deformity or foraminal compromise. 3. Left-greater-than-right facet hypertrophy and uncinate spurring C7-T1 with mild resulting left foraminal narrowing. No cord deformity. Electronically Signed   By: Jeff Stewart M.D.   On: 04/28/2015 16:30    Assessment & Plan:   Jeff Stewart was seen today for rectal pain.  Diagnoses and all orders for this visit:  Hemorrhoids, unspecified hemorrhoid type  Other orders -     ciprofloxacin (CIPRO) 500 MG tablet; Take 1 tablet (500 mg total) by mouth 2 (two) times daily. -     hydrocortisone (ANUSOL-HC) 25 MG suppository; Place 1 suppository  (25 mg total) rectally 4 (four) times daily as needed for hemorrhoids.    Metamucil 1 tbsp Twice a day for treatment and prevention  I am having Jeff Stewart start on ciprofloxacin and hydrocortisone. I am also having him maintain his morphine, morphine, testosterone cypionate, SYRINGE 3CC/21GX1-1/4", hydrochlorothiazide, sertraline, omeprazole, losartan, and Vitamin D (Ergocalciferol).  Meds ordered this encounter  Medications  . losartan (COZAAR) 50 MG tablet    Sig: Take 1 tablet by mouth daily.  . Vitamin D, Ergocalciferol, (DRISDOL) 50000 units CAPS capsule    Sig: Take 1 capsule by mouth once a week.  . ciprofloxacin (CIPRO) 500 MG tablet    Sig: Take 1 tablet (500 mg total) by mouth 2 (two) times daily.    Dispense:  20 tablet    Refill:  0  . hydrocortisone (ANUSOL-HC) 25 MG suppository    Sig: Place 1 suppository (25 mg total) rectally 4 (four) times daily as needed for hemorrhoids.    Dispense:  12 suppository    Refill:  2     Follow-up: Return if symptoms worsen or fail to improve.  Claretta Fraise, M.D.

## 2015-08-05 ENCOUNTER — Telehealth: Payer: Self-pay | Admitting: Family Medicine

## 2015-08-05 NOTE — Telephone Encounter (Signed)
It is typically not covered like that because it is an over-the-counter medication. If it cost him too much like this then have him buy it over-the-counter

## 2015-08-05 NOTE — Telephone Encounter (Signed)
Yes go ahead and send in the Anusol to the pharmacy.

## 2015-08-05 NOTE — Telephone Encounter (Signed)
Patient informed that insurance will not cover medication.  He said [employer] did not have it would have to order and was too expensive.  [facility] is cheaper.  He said he may go ahead and go to [place] and get the OTC Anusol suppositories.

## 2015-08-06 ENCOUNTER — Encounter (HOSPITAL_COMMUNITY): Payer: Self-pay | Admitting: Emergency Medicine

## 2015-08-06 ENCOUNTER — Emergency Department (HOSPITAL_COMMUNITY)
Admission: EM | Admit: 2015-08-06 | Discharge: 2015-08-06 | Disposition: A | Discharge status: Home / Self Care | Payer: Medicare Other | Attending: Emergency Medicine | Admitting: Emergency Medicine

## 2015-08-06 DIAGNOSIS — E785 Hyperlipidemia, unspecified: Secondary | ICD-10-CM | POA: Insufficient documentation

## 2015-08-06 DIAGNOSIS — G4730 Sleep apnea, unspecified: Secondary | ICD-10-CM | POA: Diagnosis not present

## 2015-08-06 DIAGNOSIS — Z8739 Personal history of other diseases of the musculoskeletal system and connective tissue: Secondary | ICD-10-CM | POA: Diagnosis not present

## 2015-08-06 DIAGNOSIS — R55 Syncope and collapse: Secondary | ICD-10-CM | POA: Diagnosis not present

## 2015-08-06 DIAGNOSIS — Z792 Long term (current) use of antibiotics: Secondary | ICD-10-CM | POA: Diagnosis not present

## 2015-08-06 DIAGNOSIS — I10 Essential (primary) hypertension: Secondary | ICD-10-CM | POA: Diagnosis not present

## 2015-08-06 DIAGNOSIS — Z79899 Other long term (current) drug therapy: Secondary | ICD-10-CM | POA: Diagnosis not present

## 2015-08-06 LAB — BASIC METABOLIC PANEL
Anion gap: 8 (ref 5–15)
BUN: 18 mg/dL (ref 6–20)
CO2: 26 mmol/L (ref 22–32)
Calcium: 9.2 mg/dL (ref 8.9–10.3)
Chloride: 101 mmol/L (ref 101–111)
Creatinine, Ser: 0.94 mg/dL (ref 0.61–1.24)
GFR calc Af Amer: >60 mL/min (ref >60)
GFR calc non Af Amer: >60 mL/min (ref >60)
Glucose, Bld: 118 mg/dL — ABNORMAL HIGH (ref 65–99)
Potassium: 4.3 mmol/L (ref 3.5–5.1)
Sodium: 135 mmol/L (ref 135–145)

## 2015-08-06 LAB — URINALYSIS, ROUTINE W REFLEX MICROSCOPIC
Bilirubin Urine: NEGATIVE
Glucose, UA: NEGATIVE mg/dL
Hgb urine dipstick: NEGATIVE
Ketones, ur: NEGATIVE mg/dL
Leukocytes, UA: NEGATIVE
Nitrite: NEGATIVE
Protein, ur: NEGATIVE mg/dL
Specific Gravity, Urine: 1.01 (ref 1.005–1.030)
pH: 7.5 (ref 5.0–8.0)

## 2015-08-06 LAB — CBC
HCT: 48.4 % (ref 39.0–52.0)
Hemoglobin: 15.7 g/dL (ref 13.0–17.0)
MCH: 28.6 pg (ref 26.0–34.0)
MCHC: 32.4 g/dL (ref 30.0–36.0)
MCV: 88.3 fL (ref 78.0–100.0)
Platelets: 168 10*3/uL (ref 150–400)
RBC: 5.48 MIL/uL (ref 4.22–5.81)
RDW: 12.3 % (ref 11.5–15.5)
WBC: 10.1 10*3/uL (ref 4.0–10.5)

## 2015-08-06 LAB — I-STAT TROPONIN, ED: Troponin i, poc: 0.02 ng/mL (ref 0.00–0.08)

## 2015-08-06 MED ORDER — SODIUM CHLORIDE 0.9 % IV BOLUS (SEPSIS)
1000.0000 mL | Freq: Once | INTRAVENOUS | Status: AC
Start: 2015-08-06 — End: 2015-08-06
  Administered 2015-08-06: 1000 mL via INTRAVENOUS

## 2015-08-06 MED ORDER — ACETAMINOPHEN 325 MG PO TABS
650.0000 mg | ORAL_TABLET | Freq: Once | ORAL | Status: AC
  Administered 2015-08-06: 650 mg via ORAL
  Filled 2015-08-06: qty 2

## 2015-08-06 NOTE — Discharge Instructions (Signed)

## 2015-08-06 NOTE — ED Provider Notes (Signed)
CSN: [id number]     Arrival date & time 08/06/15  1227 History   First MD Initiated Contact with Patient 08/06/15 1514     Chief Complaint  Patient presents with  . Near Syncope     (Consider location/radiation/quality/duration/timing/severity/associated sxs/prior Treatment) HPI Comments: Patient presents today with a chief complaint of near syncope.  He reports that onset of symptoms around 11 AM while he was outside mowing his yard in the 90 degree weather.  He states that he felt dizzy, began having tunnel vision, felt nauseous, and began sweating.  He states that his symptoms lasted for approximately 2 hours and then resolved.  He reports onset of a diffuse headache approximately 1 hour after this.  He has a headache at this time, but denies any other symptoms.  He has not taken any medication for his headache prior to arrival.  He reports similar symptoms in the past and was diagnosed with "heat stroke."  He denies any chest pain, SOB, double vision, numbness, tingling, weakness, syncope, or any other symptoms at this time.    Patient is a 60 y.o. male presenting with near-syncope. The history is provided by the patient.  Near Syncope    Past Medical History  Diagnosis Date  . Hypertension     borderline  . DDD (degenerative disc disease)     neck, lumbar  . Dyslipidemia     diet controlled  . Carpal tunnel syndrome, bilateral   . Sleep apnea     CPAP   Past Surgical History  Procedure Laterality Date  . Back surgery      x 6  . Nasal septum surgery      x 6  . Carpal tunnel release      bilateral  . Elbow surgery    . Knee arthroscopy    . Inguinal hernia repair Right    Family History  Problem Relation Age of Onset  . CAD Father 40  . Emphysema Father   . Stroke Mother 36  . CAD Brother 80    CABG  . CAD Brother    Social History  Substance Use Topics  . Smoking status: Never Smoker   . Smokeless tobacco: Never Used  . Alcohol Use: No    Review of  Systems  Cardiovascular: Positive for near-syncope.  All other systems reviewed and are negative.     Allergies  Nubain  Home Medications   Prior to Admission medications   Medication Sig Start Date End Date Taking? Authorizing Provider  ciprofloxacin (CIPRO) 500 MG tablet Take 1 tablet (500 mg total) by mouth 2 (two) times daily. 08/04/15   Claretta Fraise, MD  hydrochlorothiazide (MICROZIDE) 12.5 MG capsule Take 1 capsule (12.5 mg total) by mouth daily. 05/04/15   Fransisca Kaufmann Dettinger, MD  hydrocortisone (ANUSOL-HC) 25 MG suppository Place 1 suppository (25 mg total) rectally 4 (four) times daily as needed for hemorrhoids. 08/04/15   Claretta Fraise, MD  losartan (COZAAR) 50 MG tablet Take 1 tablet by mouth daily. 07/30/15   Historical Provider, MD  morphine (MSIR) 30 MG tablet Take 30 mg by mouth at bedtime as needed. 05/15/13   Historical Provider, MD  morphine (ROXANOL) 20 MG/ML concentrated solution Take by mouth. pump    Historical Provider, MD  omeprazole (PRILOSEC) 20 MG capsule Take 1 capsule (20 mg total) by mouth 2 (two) times daily before a meal. 06/17/15   Fransisca Kaufmann Dettinger, MD  sertraline (ZOLOFT) 100 MG tablet Take 100 mg by  mouth daily.  06/03/15   Historical Provider, MD  Syringe/Needle, Disp, (SYRINGE 3CC/21GX1-1/4") 21G X 1-1/4" 3 ML MISC 1 each by Does not apply route once a week. 04/15/15   Cassandria Anger, MD  testosterone cypionate (DEPOTESTOSTERONE CYPIONATE) 200 MG/ML injection Inject 0.5 mLs (100 mg total) into the muscle once a week. 04/15/15   Cassandria Anger, MD  Vitamin D, Ergocalciferol, (DRISDOL) 50000 units CAPS capsule Take 1 capsule by mouth once a week. 08/03/14   Historical Provider, MD   BP 148/87 mmHg  Pulse 76  Temp(Src) 98.1 F (36.7 C) (Oral)  Resp 12  SpO2 97% Physical Exam  Constitutional: He appears well-developed and well-nourished. No distress.  HENT:  Head: Normocephalic and atraumatic.  Mouth/Throat: Oropharynx is clear and moist.   Eyes: EOM are normal. Pupils are equal, round, and reactive to light.  Neck: Normal range of motion. Neck supple.  Cardiovascular: Normal rate, regular rhythm and normal heart sounds.   Pulmonary/Chest: Effort normal and breath sounds normal.  Musculoskeletal: Normal range of motion.  Neurological: He is alert. He has normal strength. No cranial nerve deficit or sensory deficit. Coordination and gait normal.  Normal gait, no ataxia  Skin: Skin is warm and dry. He is not diaphoretic.  Psychiatric: He has a normal mood and affect.  Nursing note and vitals reviewed.   ED Course  Procedures (including critical care time) Labs Review Labs Reviewed  BASIC METABOLIC PANEL - Abnormal; Notable for the following:    Glucose, Bld 118 (*)    All other components within normal limits  CBC  URINALYSIS, ROUTINE W REFLEX MICROSCOPIC (NOT AT [facility])  I-STAT TROPOININ, ED    Imaging Review No results found. I have personally reviewed and evaluated these images and lab results as part of my medical decision-making.   EKG Interpretation   Date/Time:  Thursday Aug 06 2015 12:37:30 EDT Ventricular Rate:  98 PR Interval:  152 QRS Duration: 88 QT Interval:  324 QTC Calculation: 413 R Axis:   -33 Text Interpretation:  Normal sinus rhythm Left axis deviation Left  ventricular hypertrophy Cannot rule out Septal infarct , age undetermined  Confirmed by Wilson Singer  MD, [place]inson ([id number]) on 08/06/2015 5:33:33 PM      MDM   Final diagnoses:  None   Patient presents today with near syncope.  He states that he felt lightheaded, diaphoretic,  nauseous, and began having tunnel vision while working outside in the 90 degree weather earlier today.  Symptoms had improved by the time he arrived in the ED.  He denies any CP or SOB.  No ischemic changes on EKG.  Troponin negative.  Therefore, doubt cardiac etiology.  Labs unremarkable.  Patient has normal neurological exam.  Patient given IVF.  Feel that the patient  is stable for discharge.  Return precautions given.      Hyman Bible, PA-C 08/09/15 1542  Virgel Manifold, MD 08/13/15 [phone]

## 2015-08-06 NOTE — ED Notes (Signed)
Pt reports that he was working out side and became light headed and felt like he was going to pass out. Pt denies pain at this time.

## 2015-08-13 ENCOUNTER — Ambulatory Visit (HOSPITAL_COMMUNITY)
Admission: RE | Admit: 2015-08-13 | Discharge: 2015-08-13 | Disposition: A | Discharge status: Home / Self Care | Payer: Medicare Other | Source: Ambulatory Visit | Attending: Cardiology | Admitting: Cardiology

## 2015-08-13 DIAGNOSIS — R0600 Dyspnea, unspecified: Secondary | ICD-10-CM

## 2015-08-13 LAB — PULMONARY FUNCTION TEST
DL/VA % pred: 114 %
DL/VA: 5.48 ml/min/mmHg/L
DLCO unc % pred: 109 %
DLCO unc: 39.87 ml/min/mmHg
FEF 25-75 PRE: 3.67 L/s
FEF2575-%PRED-PRE: 116 %
FEV1-%PRED-PRE: 97 %
FEV1-PRE: 3.84 L
FEV1FVC-%Pred-Pre: 104 %
FEV6-%PRED-PRE: 98 %
FEV6-PRE: 4.89 L
FEV6FVC-%Pred-Pre: 105 %
FVC-%Pred-Pre: 94 %
FVC-Pre: 4.9 L
PRE FEV1/FVC RATIO: 78 %
Pre FEV6/FVC Ratio: 100 %
RV % PRED: 139 %
RV: 3.43 L
TLC % pred: 110 %
TLC: 8.4 L

## 2015-08-27 DIAGNOSIS — G8929 Other chronic pain: Secondary | ICD-10-CM | POA: Diagnosis not present

## 2015-08-27 DIAGNOSIS — Z79891 Long term (current) use of opiate analgesic: Secondary | ICD-10-CM | POA: Diagnosis not present

## 2015-08-27 DIAGNOSIS — M5137 Other intervertebral disc degeneration, lumbosacral region: Secondary | ICD-10-CM | POA: Diagnosis not present

## 2015-08-27 DIAGNOSIS — M961 Postlaminectomy syndrome, not elsewhere classified: Secondary | ICD-10-CM | POA: Diagnosis not present

## 2015-08-31 ENCOUNTER — Ambulatory Visit (INDEPENDENT_AMBULATORY_CARE_PROVIDER_SITE_OTHER): Payer: Medicare Other | Admitting: "Endocrinology

## 2015-08-31 ENCOUNTER — Encounter: Payer: Self-pay | Admitting: "Endocrinology

## 2015-08-31 VITALS — BP 141/92 | HR 62 | Ht 73.0 in | Wt 243.0 lb

## 2015-08-31 DIAGNOSIS — E291 Testicular hypofunction: Secondary | ICD-10-CM | POA: Diagnosis not present

## 2015-08-31 NOTE — Progress Notes (Signed)
Subjective:    Patient ID: Jeff Stewart, male    DOB: 1955/11/21, PCP Fransisca Kaufmann Dettinger, MD   Past Medical History  Diagnosis Date  . Hypertension     borderline  . DDD (degenerative disc disease)     neck, lumbar  . Dyslipidemia     diet controlled  . Carpal tunnel syndrome, bilateral   . Sleep apnea     CPAP   Past Surgical History  Procedure Laterality Date  . Back surgery      x 6  . Nasal septum surgery      x 6  . Carpal tunnel release      bilateral  . Elbow surgery    . Knee arthroscopy    . Inguinal hernia repair Right    Social History   Social History  . Marital Status: Married    Spouse Name: N/A  . Number of Children: 3  . Years of Education: N/A   Occupational History  . [employer] and [employer]     Retired   Social History Main Topics  . Smoking status: Never Smoker   . Smokeless tobacco: Never Used  . Alcohol Use: No  . Drug Use: No  . Sexual Activity: Yes    Birth Control/ Protection: Post-menopausal     Comment: married for 1972   Other Topics Concern  . None   Social History Narrative   Lives with wife    Outpatient Encounter Prescriptions as of 08/31/2015  Medication Sig  . hydrocortisone (ANUSOL-HC) 25 MG suppository Place 1 suppository (25 mg total) rectally 4 (four) times daily as needed for hemorrhoids.  Marland Kitchen losartan (COZAAR) 50 MG tablet Take 1 tablet by mouth daily.  Marland Kitchen morphine (MSIR) 30 MG tablet Take 30 mg by mouth at bedtime as needed.  Marland Kitchen morphine (ROXANOL) 20 MG/ML concentrated solution Take by mouth. pump  . omeprazole (PRILOSEC) 20 MG capsule Take 1 capsule (20 mg total) by mouth 2 (two) times daily before a meal.  . sertraline (ZOLOFT) 100 MG tablet Take 100 mg by mouth daily.   . Syringe/Needle, Disp, (SYRINGE 3CC/21GX1-1/4") 21G X 1-1/4" 3 ML MISC 1 each by Does not apply route once a week.  . testosterone cypionate (DEPOTESTOSTERONE CYPIONATE) 200 MG/ML injection Inject 0.5 mLs (100 mg total) into the muscle once a  week.  . [DISCONTINUED] ciprofloxacin (CIPRO) 500 MG tablet Take 1 tablet (500 mg total) by mouth 2 (two) times daily.  . [DISCONTINUED] hydrochlorothiazide (MICROZIDE) 12.5 MG capsule Take 1 capsule (12.5 mg total) by mouth daily.  . [DISCONTINUED] Vitamin D, Ergocalciferol, (DRISDOL) 50000 units CAPS capsule Take 1 capsule by mouth once a week.   No facility-administered encounter medications on file as of 08/31/2015.   ALLERGIES: Allergies  Allergen Reactions  . Nubain [Nalbuphine Hcl] Rash   VACCINATION STATUS: Immunization History  Administered Date(s) Administered  . Influenza,inj,Quad PF,36+ Mos 01/14/2015    HPI Mr. Nester is 60 year old gentleman with above medical history. He is here to follow-up with labs for hypogonadism. - He is on testosterone 100 mg IM weekly. He feels much better. He is here to follow-up with repeat labs showing improvement in his testosterone to 301. He was known to have low testosterone for at least a year and half.   He fathers 3 grown children. He denies history of head injury , has had nasal septal surgery multiple times. He denies injury to the testicles, exposure to chemotherapy, exposure to radiation to the genitals.  However, due to chronic back injury and surgery he has exposure to heavy opioid therapy for pain control. He is currently on morphine pump. Overall on pain control for the last 10-15 years .   Review of Systems Constitutional: He has a steady weight, no subjective hyperthermia/hypothermia Eyes: no blurry vision, no xerophthalmia ENT: no sore throat, no nodules palpated in throat, no dysphagia/odynophagia, no hoarseness Cardiovascular: no CP/SOB/palpitations/leg swelling Respiratory: no cough/SOB Gastrointestinal: no N/V/D/C Musculoskeletal: no muscle/joint aches Skin: no rashes,  Genitourinary: Reports improved libido  Neurological: no tremors/numbness/tingling/dizziness Psychiatric: no depression/anxiety  Objective:    BP  141/92 mmHg  Pulse 62  Ht '6\' 1"'$  (1.854 m)  Wt 243 lb (110.224 kg)  BMI 32.07 kg/m2  SpO2 98%  Wt Readings from Last 3 Encounters:  08/31/15 243 lb (110.224 kg)  08/04/15 237 lb 9.6 oz (107.775 kg)  06/10/15 237 lb (107.502 kg)    Physical Exam   Constitutional: overweight, in NAD Eyes: PERRLA, EOMI, no exophthalmos ENT: moist mucous membranes, no thyromegaly, no cervical lymphadenopathy Cardiovascular: RRR, No MRG Respiratory: CTA B Gastrointestinal: abdomen soft, NT, ND, BS+, rectal exam deferred (patient stated he had rectal exam last year by his primary medical doctor, reportedly no prostate enlargement) Genitourinary system: He has bilaterally shrunk testicles, right testes 12 mL, left testes 15 mL. Normal scrotal mass, normal venous, no inguinal lymphadenopathy. Musculoskeletal: Large extremities patient explains for medial, no deformities, strength intact in all 4 Skin: moist, warm, no rashes Neurological: no tremor with outstretched hands, DTR normal in all 4   Recent Results (from the past 2160 hour(s))  TESTOSTERONE, FREE AND TOTAL (INCLUDES SHBG)-(MALES)     Status: None   Collection Time: 07/21/15  9:02 AM  Result Value Ref Range   Testosterone 301 250 - 827 ng/dL    Comment: Men with clinically significant hypogonadal symptoms and testosterone values repeatedly less than approximately 300 ng/dL may benefit from testosterone treatment after adequate risk and benefits counseling.    Sex Hormone Binding 26 22 - 77 nmol/L   Testosterone, Free 67.6 47.0 - 244.0 pg/mL    Comment:   The concentration of free testosterone is derived from a mathematical expression based on constants for the binding of testosterone to sex hormone-binding globulin and albumin.    Testosterone-% Free 2.2 1.6 - 2.9 %  CBC with Differential/Platelet     Status: None   Collection Time: 07/21/15  9:02 AM  Result Value Ref Range   WBC 4.9 3.8 - 10.8 K/uL   RBC 5.18 4.20 - 5.80 MIL/uL    Hemoglobin 15.3 13.2 - 17.1 g/dL   HCT 46.2 38.5 - 50.0 %   MCV 89.2 80.0 - 100.0 fL   MCH 29.5 27.0 - 33.0 pg   MCHC 33.1 32.0 - 36.0 g/dL   RDW 13.0 11.0 - 15.0 %   Platelets 145 140 - 400 K/uL   MPV 10.4 7.5 - 12.5 fL   Neutro Abs 2940 1500 - 7800 cells/uL   Lymphs Abs 1421 850 - 3900 cells/uL   Monocytes Absolute 441 200 - 950 cells/uL   Eosinophils Absolute 98 15 - 500 cells/uL   Basophils Absolute 0 0 - 200 cells/uL   Neutrophils Relative % 60 %   Lymphocytes Relative 29 %   Monocytes Relative 9 %   Eosinophils Relative 2 %   Basophils Relative 0 %   Smear Review Criteria for review not met     Comment: ** Please note change in unit of  measure and reference range(s). **  Hepatic function panel     Status: Abnormal   Collection Time: 07/21/15  9:02 AM  Result Value Ref Range   Total Bilirubin 0.5 0.2 - 1.2 mg/dL   Bilirubin, Direct 0.1 <=0.2 mg/dL   Indirect Bilirubin 0.4 0.2 - 1.2 mg/dL   Alkaline Phosphatase 73 40 - 115 U/L   AST 17 10 - 35 U/L   ALT 15 9 - 46 U/L   Total Protein 6.0 (L) 6.1 - 8.1 g/dL   Albumin 4.1 3.6 - 5.1 g/dL  Basic metabolic panel     Status: Abnormal   Collection Time: 08/06/15 12:40 PM  Result Value Ref Range   Sodium 135 135 - 145 mmol/L   Potassium 4.3 3.5 - 5.1 mmol/L   Chloride 101 101 - 111 mmol/L   CO2 26 22 - 32 mmol/L   Glucose, Bld 118 (H) 65 - 99 mg/dL   BUN 18 6 - 20 mg/dL   Creatinine, Ser 0.94 0.61 - 1.24 mg/dL   Calcium 9.2 8.9 - 10.3 mg/dL   GFR calc non Af Amer >60 >60 mL/min   GFR calc Af Amer >60 >60 mL/min    Comment: (NOTE) The eGFR has been calculated using the CKD EPI equation. This calculation has not been validated in all clinical situations. eGFR's persistently <60 mL/min signify possible Chronic Kidney Disease.    Anion gap 8 5 - 15  CBC     Status: None   Collection Time: 08/06/15 12:40 PM  Result Value Ref Range   WBC 10.1 4.0 - 10.5 K/uL   RBC 5.48 4.22 - 5.81 MIL/uL   Hemoglobin 15.7 13.0 - 17.0 g/dL    HCT 48.4 39.0 - 52.0 %   MCV 88.3 78.0 - 100.0 fL   MCH 28.6 26.0 - 34.0 pg   MCHC 32.4 30.0 - 36.0 g/dL   RDW 12.3 11.5 - 15.5 %   Platelets 168 150 - 400 K/uL  Urinalysis, Routine w reflex microscopic     Status: None   Collection Time: 08/06/15  4:40 PM  Result Value Ref Range   Color, Urine YELLOW YELLOW   APPearance CLEAR CLEAR   Specific Gravity, Urine 1.010 1.005 - 1.030   pH 7.5 5.0 - 8.0   Glucose, UA NEGATIVE NEGATIVE mg/dL   Hgb urine dipstick NEGATIVE NEGATIVE   Bilirubin Urine NEGATIVE NEGATIVE   Ketones, ur NEGATIVE NEGATIVE mg/dL   Protein, ur NEGATIVE NEGATIVE mg/dL   Nitrite NEGATIVE NEGATIVE   Leukocytes, UA NEGATIVE NEGATIVE    Comment: MICROSCOPIC NOT DONE ON URINES WITH NEGATIVE PROTEIN, BLOOD, LEUKOCYTES, NITRITE, OR GLUCOSE <1000 mg/dL.  I-stat troponin, ED     Status: None   Collection Time: 08/06/15  4:44 PM  Result Value Ref Range   Troponin i, poc 0.02 0.00 - 0.08 ng/mL   Comment 3            Comment: Due to the release kinetics of cTnI, a negative result within the first hours of the onset of symptoms does not rule out myocardial infarction with certainty. If myocardial infarction is still suspected, repeat the test at appropriate intervals.   Pulmonary function test     Status: None   Collection Time: 08/13/15  9:48 AM  Result Value Ref Range   FVC-Pre 4.90 L   FVC-%Pred-Pre 94 %   FEV1-Pre 3.84 L   FEV1-%Pred-Pre 97 %   FEV6-Pre 4.89 L   FEV6-%Pred-Pre 98 %  Pre FEV1/FVC ratio 78 %   FEV1FVC-%Pred-Pre 104 %   Pre FEV6/FVC Ratio 100 %   FEV6FVC-%Pred-Pre 105 %   FEF 25-75 Pre 3.67 L/sec   FEF2575-%Pred-Pre 116 %   RV 3.43 L   RV % pred 139 %   TLC 8.40 L   TLC % pred 110 %   DLCO unc 39.87 ml/min/mmHg   DLCO unc % pred 109 %   DL/VA 5.48 ml/min/mmHg/L   DL/VA % pred 114 %     Assessment & Plan:   1. Hypogonadism, male  -Testosterone replacement helped improve his testosterone from 74 to 301. No adverse events  noted. 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  -It is possible that it is multifactorial including exposure to heavy opioids, multiple back surgeries, prior inguinal surgery, or idiopathic.  -Prolactin level is favorable at 6.1, he would not need pituitary/sella imaging for now.  -He will benefit from continued replacement of testosterone. -I advised him to continue 100 mg of testosterone cypionate IM weekly for target testosterone level of 400-600 mcg/dL.  -He will return in 4 months with lab work for CBC total testosterone, transaminases . -He is advised to maintain his annual physical with his primary medical doctor and obtain PSA.    - I advised patient to maintain close follow up with Worthy Rancher, MD for primary care needs. Follow up plan: Return in about 4 months (around 12/31/2015) for follow up with pre-visit labs.  Glade Lloyd, MD Phone: [phone]  Fax: [phone]   08/31/2015, 10:42 AM

## 2015-10-04 ENCOUNTER — Emergency Department (HOSPITAL_COMMUNITY)
Admission: EM | Admit: 2015-10-04 | Discharge: 2015-10-04 | Disposition: A | Discharge status: Home / Self Care | Payer: Medicare Other | Attending: Emergency Medicine | Admitting: Emergency Medicine

## 2015-10-04 ENCOUNTER — Encounter (HOSPITAL_COMMUNITY): Payer: Self-pay | Admitting: *Deleted

## 2015-10-04 ENCOUNTER — Emergency Department (INDEPENDENT_AMBULATORY_CARE_PROVIDER_SITE_OTHER)
Admission: EM | Admit: 2015-10-04 | Discharge: 2015-10-04 | Disposition: A | Discharge status: Nursing Home (Includes ICF) | Payer: Medicare Other | Source: Home / Self Care | Attending: Family Medicine | Admitting: Family Medicine

## 2015-10-04 ENCOUNTER — Other Ambulatory Visit: Payer: Self-pay

## 2015-10-04 DIAGNOSIS — R5383 Other fatigue: Secondary | ICD-10-CM

## 2015-10-04 DIAGNOSIS — R110 Nausea: Secondary | ICD-10-CM

## 2015-10-04 DIAGNOSIS — R51 Headache: Secondary | ICD-10-CM | POA: Diagnosis present

## 2015-10-04 DIAGNOSIS — R9431 Abnormal electrocardiogram [ECG] [EKG]: Secondary | ICD-10-CM

## 2015-10-04 DIAGNOSIS — R030 Elevated blood-pressure reading, without diagnosis of hypertension: Secondary | ICD-10-CM | POA: Diagnosis not present

## 2015-10-04 DIAGNOSIS — IMO0001 Reserved for inherently not codable concepts without codable children: Secondary | ICD-10-CM

## 2015-10-04 DIAGNOSIS — I10 Essential (primary) hypertension: Secondary | ICD-10-CM | POA: Diagnosis not present

## 2015-10-04 DIAGNOSIS — Z79899 Other long term (current) drug therapy: Secondary | ICD-10-CM | POA: Insufficient documentation

## 2015-10-04 DIAGNOSIS — R519 Headache, unspecified: Secondary | ICD-10-CM

## 2015-10-04 LAB — COMPREHENSIVE METABOLIC PANEL
ALT: 25 U/L (ref 17–63)
AST: 29 U/L (ref 15–41)
Albumin: 4.2 g/dL (ref 3.5–5.0)
Alkaline Phosphatase: 78 U/L (ref 38–126)
Anion gap: 5 (ref 5–15)
BUN: 12 mg/dL (ref 6–20)
CHLORIDE: 103 mmol/L (ref 101–111)
CO2: 31 mmol/L (ref 22–32)
CREATININE: 0.88 mg/dL (ref 0.61–1.24)
Calcium: 9.4 mg/dL (ref 8.9–10.3)
Glucose, Bld: 113 mg/dL — ABNORMAL HIGH (ref 65–99)
POTASSIUM: 5.1 mmol/L (ref 3.5–5.1)
Sodium: 139 mmol/L (ref 135–145)
Total Bilirubin: 0.6 mg/dL (ref 0.3–1.2)
Total Protein: 7 g/dL (ref 6.5–8.1)

## 2015-10-04 LAB — I-STAT TROPONIN, ED
TROPONIN I, POC: 0.01 ng/mL (ref 0.00–0.08)
Troponin i, poc: 0 ng/mL (ref 0.00–0.08)

## 2015-10-04 LAB — URINALYSIS, ROUTINE W REFLEX MICROSCOPIC
Bilirubin Urine: NEGATIVE
GLUCOSE, UA: NEGATIVE mg/dL
Hgb urine dipstick: NEGATIVE
Ketones, ur: NEGATIVE mg/dL
LEUKOCYTES UA: NEGATIVE
Nitrite: NEGATIVE
PROTEIN: NEGATIVE mg/dL
Specific Gravity, Urine: 1.018 (ref 1.005–1.030)
pH: 7.5 (ref 5.0–8.0)

## 2015-10-04 LAB — CBC
HEMATOCRIT: 46.8 % (ref 39.0–52.0)
Hemoglobin: 15 g/dL (ref 13.0–17.0)
MCH: 28.4 pg (ref 26.0–34.0)
MCHC: 32.1 g/dL (ref 30.0–36.0)
MCV: 88.5 fL (ref 78.0–100.0)
PLATELETS: 166 10*3/uL (ref 150–400)
RBC: 5.29 MIL/uL (ref 4.22–5.81)
RDW: 13.8 % (ref 11.5–15.5)
WBC: 7.9 10*3/uL (ref 4.0–10.5)

## 2015-10-04 LAB — LIPASE, BLOOD: LIPASE: 27 U/L (ref 11–51)

## 2015-10-04 NOTE — Discharge Instructions (Signed)
There does not appear to be an emergent cause for your symptoms at this time. Your exam, labs, EKG were all reassuring. Please follow-up with your doctor/cardiologist next week for further evaluation and management of your symptoms. Return to ED for any new or worsening symptoms as we discussed.

## 2015-10-04 NOTE — ED Provider Notes (Signed)
CSN: [id number]     Arrival date & time 10/04/15  1142 History   First MD Initiated Contact with Patient 10/04/15 1220     Chief Complaint  Patient presents with  . Headache  . Hypertension  . Nausea   (Consider location/radiation/quality/duration/timing/severity/associated sxs/prior Treatment) HPI Jeff Stewart is a 60 y.o. male presenting to UC with c/o intermittent episodes of elevated blood pressure for the last 5-6 months. Today, pt developed severe frontal headache, nausea and fatigue. He was found to have a BP of 171/90 at 11AM. Pt was at church when symptoms started.  He has been on losartin for the last 3 months and has been taking medication as prescribed but still has these episodes.  He has been seen by cardiology in the past and advised he has an enlarged heart.  He had a nuclear stress test, per pt 2 months ago, per medical records, stress test was 01/2015. normal at that time. He has not had a cardiac cath. Denies chest pain or SOB at this time but states he does not feel well.    Past Medical History  Diagnosis Date  . Hypertension     borderline  . DDD (degenerative disc disease)     neck, lumbar  . Dyslipidemia     diet controlled  . Carpal tunnel syndrome, bilateral   . Sleep apnea     CPAP   Past Surgical History  Procedure Laterality Date  . Back surgery      x 6  . Nasal septum surgery      x 6  . Carpal tunnel release      bilateral  . Elbow surgery    . Knee arthroscopy    . Inguinal hernia repair Right    Family History  Problem Relation Age of Onset  . CAD Father 27  . Emphysema Father   . Stroke Mother 85  . CAD Brother 72    CABG  . CAD Brother    Social History  Substance Use Topics  . Smoking status: Never Smoker   . Smokeless tobacco: Never Used  . Alcohol Use: No    Review of Systems  Constitutional: Positive for fatigue. Negative for fever, chills and diaphoresis.  Respiratory: Negative for cough and shortness of breath.    Cardiovascular: Negative for chest pain and palpitations.  Gastrointestinal: Positive for nausea. Negative for vomiting.  Neurological: Positive for light-headedness and headaches. Negative for dizziness and syncope.    Allergies  Nubain  Home Medications   Prior to Admission medications   Medication Sig Start Date End Date Taking? Authorizing Provider  hydrocortisone (ANUSOL-HC) 25 MG suppository Place 1 suppository (25 mg total) rectally 4 (four) times daily as needed for hemorrhoids. 08/04/15   Claretta Fraise, MD  losartan (COZAAR) 50 MG tablet Take 1 tablet by mouth daily. 07/30/15   Historical Provider, MD  morphine (MSIR) 30 MG tablet Take 30 mg by mouth at bedtime as needed. 05/15/13   Historical Provider, MD  morphine (ROXANOL) 20 MG/ML concentrated solution Take by mouth. pump    Historical Provider, MD  omeprazole (PRILOSEC) 20 MG capsule Take 1 capsule (20 mg total) by mouth 2 (two) times daily before a meal. 06/17/15   Fransisca Kaufmann Dettinger, MD  sertraline (ZOLOFT) 100 MG tablet Take 100 mg by mouth daily.  06/03/15   Historical Provider, MD  Syringe/Needle, Disp, (SYRINGE 3CC/21GX1-1/4") 21G X 1-1/4" 3 ML MISC 1 each by Does not apply route once a week.  04/15/15   Cassandria Anger, MD  testosterone cypionate (DEPOTESTOSTERONE CYPIONATE) 200 MG/ML injection Inject 0.5 mLs (100 mg total) into the muscle once a week. 04/15/15   Cassandria Anger, MD   Meds Ordered and Administered this Visit  Medications - No data to display  BP 148/91 mmHg  Pulse 76  Temp(Src) 97.9 F (36.6 C) (Oral)  Ht 6\' 1"  (1.854 m)  Wt 246 lb (111.585 kg)  BMI 32.46 kg/m2  SpO2 96% No data found.   Physical Exam  Constitutional: He is oriented to person, place, and time. He appears well-developed and well-nourished.  HENT:  Head: Normocephalic and atraumatic.  Eyes: Conjunctivae and EOM are normal. Pupils are equal, round, and reactive to light. No scleral icterus.  Neck: Normal range of motion.   Cardiovascular: Normal rate, regular rhythm and normal heart sounds.   Pulmonary/Chest: Effort normal and breath sounds normal. No respiratory distress. He has no wheezes. He has no rales.  Abdominal: Soft. He exhibits no distension. There is no tenderness.  Musculoskeletal: Normal range of motion.  Neurological: He is alert and oriented to person, place, and time.  Skin: Skin is warm and dry.  Nursing note and vitals reviewed.   ED Course  Procedures (including critical care time)  Labs Review Labs Reviewed - No data to display  Imaging Review No results found.  Date/Time: 10/04/15  13:07:00 Ventricular Rate: 67 PR Interval: 146 QRS Duration: 102 QT Interval: 370 QTC Calculation: 382 P-QRS-T: 36/-20/33 Text Interpretation: Sinus rhythm, Left atrial enlargement, anterolateral ST-elevation, repolarization variant. Negative precordial T-waves, may be normal, consider anteroseptal ischemia. Borderline.    Last EKG on 08/06/15: normal sinus rhythm, left axis deviation, left ventricular hypertrophy, cannot r/o septal infarct, age undetermined.   MDM   1. Other fatigue   2. Generalized headache   3. Elevated blood pressure   4. Nonspecific abnormal electrocardiogram (ECG) (EKG)    Pt c/o fatigue and headache with nausea due to elevated BP that started around 11AM today.  BP normal in UC, however, pt has had similar episodes intermittently once a week for 5-6 months.    Currently followed by cardiology.  EKG abnormal, will send to ED for further evaluation with cardiac enzymes. Pt accompanied by his wife who feels comfortably driving pt POV to [facility] ER where he has been seem before with similar symptoms.    Noland Fordyce, PA-C 10/04/15 [phone]

## 2015-10-04 NOTE — ED Notes (Signed)
Pt sent here from [facility]. Reports episodes of HTN over past 5 months. Reports onset of symptoms yesterday and having headache, fatigue and nausea. Took bp earlier and it was elevated. Had ekg done at ucc. No acute distress noted at triage.

## 2015-10-04 NOTE — ED Provider Notes (Signed)
CSN: [id number]     Arrival date & time 10/04/15  1357 History   First MD Initiated Contact with Patient 10/04/15 1809     Chief Complaint  Patient presents with  . Hypertension  . Nausea  . Headache     (Consider location/radiation/quality/duration/timing/severity/associated sxs/prior Treatment) HPI Jeff Stewart is a 60 y.o. male history of hypertension, sleep apnea, multiple back surgeries on morphine pump, here for evaluation of hypertension with nausea and headache. Patient reports at approximately 11:00 this morning, while at church, he began to feel warm, sweaty and became nauseated with associated headache. The symptoms lasted for a couple of hours and spontaneously resolved. He reports he has had multiple episodes similar to this over the past 3 months. He reports going to urgent care this morning for this problem and was sent to ED for further evaluation. No fevers, chills, chest pain, usual shortness of breath, orthopnea or PND, leg swelling, cough or hemoptysis. He denies any discomfort now and states he feels much better without any intervention. His cardiologist is Dr. Percival Spanish.  Past Medical History  Diagnosis Date  . Hypertension     borderline  . DDD (degenerative disc disease)     neck, lumbar  . Dyslipidemia     diet controlled  . Carpal tunnel syndrome, bilateral   . Sleep apnea     CPAP   Past Surgical History  Procedure Laterality Date  . Back surgery      x 6  . Nasal septum surgery      x 6  . Carpal tunnel release      bilateral  . Elbow surgery    . Knee arthroscopy    . Inguinal hernia repair Right    Family History  Problem Relation Age of Onset  . CAD Father 37  . Emphysema Father   . Stroke Mother 38  . CAD Brother 72    CABG  . CAD Brother    Social History  Substance Use Topics  . Smoking status: Never Smoker   . Smokeless tobacco: Never Used  . Alcohol Use: No    Review of Systems A 10 point review of systems was completed and  was negative except for pertinent positives and negatives as mentioned in the history of present illness     Allergies  Nubain  Home Medications   Prior to Admission medications   Medication Sig Start Date End Date Taking? Authorizing Provider  hydrocortisone (ANUSOL-HC) 25 MG suppository Place 1 suppository (25 mg total) rectally 4 (four) times daily as needed for hemorrhoids. 08/04/15   Claretta Fraise, MD  losartan (COZAAR) 50 MG tablet Take 1 tablet by mouth daily. 07/30/15   Historical Provider, MD  morphine (MSIR) 30 MG tablet Take 30 mg by mouth at bedtime as needed. 05/15/13   Historical Provider, MD  morphine (ROXANOL) 20 MG/ML concentrated solution Take by mouth. pump    Historical Provider, MD  omeprazole (PRILOSEC) 20 MG capsule Take 1 capsule (20 mg total) by mouth 2 (two) times daily before a meal. 06/17/15   Fransisca Kaufmann Dettinger, MD  sertraline (ZOLOFT) 100 MG tablet Take 100 mg by mouth daily.  06/03/15   Historical Provider, MD  Syringe/Needle, Disp, (SYRINGE 3CC/21GX1-1/4") 21G X 1-1/4" 3 ML MISC 1 each by Does not apply route once a week. 04/15/15   Cassandria Anger, MD  testosterone cypionate (DEPOTESTOSTERONE CYPIONATE) 200 MG/ML injection Inject 0.5 mLs (100 mg total) into the muscle once a week. 04/15/15  Cassandria Anger, MD   BP 146/94 mmHg  Pulse 73  Temp(Src) 98.5 F (36.9 C) (Oral)  Resp 11  SpO2 98% Physical Exam  Constitutional: He is oriented to person, place, and time. He appears well-developed and well-nourished.  Very well-appearing Caucasian male in no apparent distress.  HENT:  Head: Normocephalic and atraumatic.  Mouth/Throat: Oropharynx is clear and moist.  Eyes: Conjunctivae are normal. Pupils are equal, round, and reactive to light. Right eye exhibits no discharge. Left eye exhibits no discharge. No scleral icterus.  Neck: Neck supple.  Cardiovascular: Normal rate, regular rhythm and normal heart sounds.   Pulmonary/Chest: Effort normal and  breath sounds normal. No respiratory distress. He has no wheezes. He has no rales.  Abdominal: Soft. There is no tenderness.  Musculoskeletal: He exhibits no tenderness.  Neurological: He is alert and oriented to person, place, and time.  Cranial Nerves II-XII grossly intact  Skin: Skin is warm and dry. No rash noted.  Psychiatric: He has a normal mood and affect.  Nursing note and vitals reviewed.   ED Course  Procedures (including critical care time) Labs Review Labs Reviewed  COMPREHENSIVE METABOLIC PANEL - Abnormal; Notable for the following:    Glucose, Bld 113 (*)    All other components within normal limits  LIPASE, BLOOD  CBC  URINALYSIS, ROUTINE W REFLEX MICROSCOPIC (NOT AT [facility])  I-STAT TROPOININ, ED  I-STAT TROPOININ, ED    Imaging Review No results found. I have personally reviewed and evaluated these images and lab results as part of my medical decision-making.   EKG Interpretation None     See. Dr. Rogene Houston note for ECG interpretation.  MDM  Patient presents for evaluation of intermittent elevated blood pressure, nausea, fatigue and intermittent headache over the past 3 months. Normal blood pressures and emergency department. Clinical presentation not consistent with ACS, PE or other emergent pathology. Patient is hemodynamically stable on arrival, denies any discomfort in emergency department. Screening labs are unremarkable, ECG reassuring, troponin is negative. However, we will obtain delta troponin and if negative, we'll discharge and have patient follow-up with his cardiologist. No evidence of other acute or emergent pathology at this time. Prior to patient discharge, I discussed and reviewed this case with Dr.Zackowski  Final diagnoses:  Nausea  Other fatigue  Nonintractable headache, unspecified chronicity pattern, unspecified headache type  Elevated blood pressure      Comer Locket, PA-C 10/04/15 2107

## 2015-10-04 NOTE — ED Notes (Signed)
Pt here has been having episodes of hypertension for past 5 months.  Has been under the care pf a physician for hypertension for the past 5 months.  This am at church felt like his BP was elevated, had nausea, dizziness, headache.  Taken 171/90 at 11am.  Has been on lasartin for the last 3 months.  Has these episodes about 1 X a week.

## 2015-10-04 NOTE — ED Provider Notes (Signed)
Medical screening examination/treatment/procedure(s) were performed by non-physician practitioner and as supervising physician I was immediately available for consultation/collaboration.   EKG Interpretation None      ED ECG REPORT   Date: 10/04/2015  Rate: 61  Rhythm: normal sinus rhythm  QRS Axis: normal  Intervals: normal  ST/T Wave abnormalities: nonspecific ST changes  Conduction Disutrbances:none  Narrative Interpretation:   Old EKG Reviewed: none available  I have personally reviewed the EKG tracing and agree with the computerized printout as noted.   EKG with nonspecific ST segment elevation through the anterior lateral leads.  Fredia Sorrow, MD 10/04/15 2105

## 2015-10-04 NOTE — ED Notes (Signed)
PT reports his HA started this AM. Pt has not taken any meds for HA.  Pt reports he does take all his meds as instructed.

## 2015-10-13 ENCOUNTER — Other Ambulatory Visit: Payer: Self-pay | Admitting: Family Medicine

## 2015-10-28 NOTE — Progress Notes (Signed)
HPI The patient presents for evaluation of chest pain and arm pain.  In the past he had a negative POET (Plain Old Exercise Treadmill) for evaluation of chest pain.Marland Kitchen  At [facility] he also had a negative Lexiscan Myoview.  Since I last saw him in March, he was in the ED in May with near syncope and was treated with hydration.  He was back in the ED with nausea in July.  He presents for follow up.  Since I last saw him in his emergency room visit she continues to get episodes almost weekly also nausea. It seems to come in spells. He has discomfort in his left lower axillary chest area. He gets diaphoretic with it. He might get somewhat short of breath. He just doesn't feel well at all after it happens.  He does get diaphoresis.  He does not have a change in his BMs.  He denies any palpitations, presyncope or syncope.  He has no PND or orthopnea.  He is limited by chronic back pain.  He does bring a blood pressure diary today and his blood pressures are typically running about [id number] and his diastolics in the 0000000.  Of note he did have normal liver enzymes and lipase with me was in the hospital emergency room in July.  Allergies  Allergen Reactions  . Nubain [Nalbuphine Hcl] Rash    Current Outpatient Prescriptions  Medication Sig Dispense Refill  . hydrocortisone (ANUSOL-HC) 25 MG suppository Place 1 suppository (25 mg total) rectally 4 (four) times daily as needed for hemorrhoids. 12 suppository 2  . losartan (COZAAR) 50 MG tablet Take 1 tablet (50 mg total) by mouth 2 (two) times daily. 60 tablet 6  . morphine (MSIR) 30 MG tablet Take 30 mg by mouth at bedtime as needed.    Marland Kitchen morphine (ROXANOL) 20 MG/ML concentrated solution Take by mouth. pump    . omeprazole (PRILOSEC) 20 MG capsule TAKE  (1)  CAPSULE  TWICE DAILY (TAKE ON AN EMPTY STOMACH AT LEAST 30MIN- UTES BEFORE MEALS). 180 capsule 0  . sertraline (ZOLOFT) 100 MG tablet Take 100 mg by mouth daily.     . Syringe/Needle, Disp, (SYRINGE  3CC/21GX1-1/4") 21G X 1-1/4" 3 ML MISC 1 each by Does not apply route once a week. 50 each 1  . testosterone cypionate (DEPOTESTOSTERONE CYPIONATE) 200 MG/ML injection Inject 0.5 mLs (100 mg total) into the muscle once a week. 10 mL 1   No current facility-administered medications for this visit.     Past Medical History:  Diagnosis Date  . Carpal tunnel syndrome, bilateral   . DDD (degenerative disc disease)    neck, lumbar  . Dyslipidemia    diet controlled  . Hypertension    borderline  . Sleep apnea    CPAP    Past Surgical History:  Procedure Laterality Date  . BACK SURGERY     x 6  . CARPAL TUNNEL RELEASE     bilateral  . ELBOW SURGERY    . INGUINAL HERNIA REPAIR Right   . KNEE ARTHROSCOPY    . NASAL SEPTUM SURGERY     x 6  . NISSEN FUNDOPLICATION      ROS:  As stated in the HPI and negative for all other systems.  PHYSICAL EXAM BP (!) 149/82   Pulse 70   Ht 6\' 1"  (1.854 m)   Wt 252 lb (114.3 kg)   BMI 33.25 kg/m  GENERAL:  Well appearing NECK:  No jugular venous distention,  waveform within normal limits, carotid upstroke brisk and symmetric, no bruits, no thyromegaly LUNGS:  Clear to auscultation bilaterally BACK:  No CVA tenderness CHEST:  Unremarkable HEART:  PMI not displaced or sustained,S1 and S2 within normal limits, no S3, no S4, no clicks, no rubs, no murmurs ABD:  Flat, positive bowel sounds normal in frequency in pitch, no bruits, no rebound, no guarding, no midline pulsatile mass, there is some mild tenderness in his epigastric area and RUQ, no hepatomegaly, no splenomegaly, morphine pump EXT:  2 plus pulses throughout, no edema, no cyanosis no clubbing    ASSESSMENT AND PLAN  NECK/SHOULDER PAIN:  Do not suspect a cardiac etiology to his discomfort. It sounds like this could be GI and possibly related to his gallbladder. I'm going to take the liberty of setting him up to see a gastroenterologist.  HTN:  His BP is not well controlled. I'm  going to increase his Cozaar to 50 mg twice daily. He will keep a low pressure diary.  ED notes reviewed.

## 2015-10-29 ENCOUNTER — Encounter: Payer: Self-pay | Admitting: Cardiology

## 2015-10-29 ENCOUNTER — Ambulatory Visit (INDEPENDENT_AMBULATORY_CARE_PROVIDER_SITE_OTHER): Payer: Medicare Other | Admitting: Cardiology

## 2015-10-29 VITALS — BP 149/82 | HR 70 | Ht 73.0 in | Wt 252.0 lb

## 2015-10-29 DIAGNOSIS — R0789 Other chest pain: Secondary | ICD-10-CM

## 2015-10-29 DIAGNOSIS — R079 Chest pain, unspecified: Secondary | ICD-10-CM

## 2015-10-29 MED ORDER — LOSARTAN POTASSIUM 50 MG PO TABS: 50.0000 mg | ORAL_TABLET | Freq: Two times a day (BID) | ORAL | 6 refills | Status: DC

## 2015-10-29 NOTE — Patient Instructions (Signed)
Medication Instructions:   Increase Cozaar to 50mg  twice a day    Continue all other medications.    Labwork: none  Testing/Procedures: none  Referrals:  GI (gastroentrology) - Dr. Lucio Edward   Follow-Up: Your physician wants you to follow up in: 6 months.  You will receive a reminder letter in the mail one-two months in advance.  If you don't receive a letter, please call our office to schedule the follow up appointment   Any Other Special Instructions Will Be Listed Below (If Applicable).  If you need a refill on your cardiac medications before your next appointment, please call your pharmacy.

## 2015-11-04 ENCOUNTER — Encounter: Payer: Self-pay | Admitting: Gastroenterology

## 2015-11-04 ENCOUNTER — Ambulatory Visit (INDEPENDENT_AMBULATORY_CARE_PROVIDER_SITE_OTHER): Payer: Medicare Other | Admitting: Gastroenterology

## 2015-11-04 VITALS — BP 132/78 | HR 74 | Ht 73.0 in | Wt 250.1 lb

## 2015-11-04 DIAGNOSIS — R110 Nausea: Secondary | ICD-10-CM | POA: Diagnosis not present

## 2015-11-04 DIAGNOSIS — R1013 Epigastric pain: Secondary | ICD-10-CM | POA: Insufficient documentation

## 2015-11-04 DIAGNOSIS — R0789 Other chest pain: Secondary | ICD-10-CM

## 2015-11-04 DIAGNOSIS — K6289 Other specified diseases of anus and rectum: Secondary | ICD-10-CM | POA: Diagnosis not present

## 2015-11-04 NOTE — Progress Notes (Signed)
Reviewed and agree with initial management plan.  Nozomi Mettler T. Emilynn Srinivasan, MD FACG 

## 2015-11-04 NOTE — Progress Notes (Signed)
11/04/2015 Jeff Stewart [id number] April 04, 1955   HISTORY OF PRESENT ILLNESS:  This is a 60 year old male who is previously known to Dr. Fuller Plan in March 2010 for colonoscopy at which time he was found have a normal study except for internal hemorrhoids. He presents to our office today with complaints of mid to left-sided chest pain along with mid epigastric abdominal pain and nausea. He does have a history of a Nissen fundoplication several years ago and says that he had done well since that surgery without any acid medication. He was recently placed back on omeprazole 20 mg twice a day to see if that would help the symptoms. So far has noticed no improvement.  He says that the symptom com and go, occurring about once a week and he also gets some lightheadedness when they occur as well.  Had cardiac evaluation including ECHO and stress test, which were unremarkable.    While he is here he also complains of some discomfort in his rectum. Was given Anusol suppositories by his PCP, which he has been using with some relief, but would like me to perform an exam today as well.   Past Medical History:  Diagnosis Date  . Carpal tunnel syndrome, bilateral   . DDD (degenerative disc disease)    neck, lumbar  . Dyslipidemia    diet controlled  . Hypertension    borderline  . Sleep apnea    CPAP   Past Surgical History:  Procedure Laterality Date  . BACK SURGERY     x 6  . CARPAL TUNNEL RELEASE     bilateral  . ELBOW SURGERY    . INGUINAL HERNIA REPAIR Right   . KNEE ARTHROSCOPY    . NASAL SEPTUM SURGERY     x 6  . NISSEN FUNDOPLICATION      reports that he has never smoked. He has never used smokeless tobacco. He reports that he does not drink alcohol or use drugs. family history includes CAD in his brother; CAD (age of onset: 8) in his brother; CAD (age of onset: 24) in his father; Emphysema in his father; Stroke (age of onset: 48) in his mother. Allergies  Allergen Reactions  .  Nubain [Nalbuphine Hcl] Rash      Outpatient Encounter Prescriptions as of 11/04/2015  Medication Sig  . hydrocortisone (ANUSOL-HC) 25 MG suppository Place 1 suppository (25 mg total) rectally 4 (four) times daily as needed for hemorrhoids.  Marland Kitchen losartan (COZAAR) 50 MG tablet Take 1 tablet (50 mg total) by mouth 2 (two) times daily.  Marland Kitchen morphine (MSIR) 30 MG tablet Take 30 mg by mouth at bedtime as needed.  Marland Kitchen morphine (ROXANOL) 20 MG/ML concentrated solution Take by mouth. pump  . omeprazole (PRILOSEC) 20 MG capsule TAKE  (1)  CAPSULE  TWICE DAILY (TAKE ON AN EMPTY STOMACH AT LEAST 30MIN- UTES BEFORE MEALS).  Marland Kitchen sertraline (ZOLOFT) 100 MG tablet Take 100 mg by mouth daily.   . Syringe/Needle, Disp, (SYRINGE 3CC/21GX1-1/4") 21G X 1-1/4" 3 ML MISC 1 each by Does not apply route once a week.  . testosterone cypionate (DEPOTESTOSTERONE CYPIONATE) 200 MG/ML injection Inject 0.5 mLs (100 mg total) into the muscle once a week.   No facility-administered encounter medications on file as of 11/04/2015.      REVIEW OF SYSTEMS  : All other systems reviewed and negative except where noted in the History of Present Illness.   PHYSICAL EXAM: BP 132/78 (BP Location: Left Arm, Patient Position:  Sitting, Cuff Size: Normal)   Pulse 74   Ht 6\' 1"  (1.854 m)   Wt 250 lb 2 oz (113.5 kg)   BMI 33.00 kg/m  General: Well developed white male in no acute distress Head: Normocephalic and atraumatic Eyes:  Sclerae anicteric, conjunctiva pink. Ears: Normal auditory acuity Lungs: Clear throughout to auscultation Heart: Regular rate and rhythm Abdomen: Soft, non-distended.  Normal bowel sounds.  Morphine pump noted on left abdomen.  Mid-epigastric TTP. Rectal:  No external abnormalities identified.  DRE did not reveal any masses.  Trace light brown stool on exam glove was heme negative. Musculoskeletal: Symmetrical with no gross deformities  Skin: No lesions on visible extremities Extremities: No edema    Neurological: Alert oriented x 4, grossly non-focal Psychological:  Alert and cooperative. Normal mood and affect  ASSESSMENT AND PLAN: -Atypical chest pain with epigastric abdominal pain and nausea:  ? Reflux related. Just recently placed back on omeprazole 20 mg twice a day, but no improvement so far. Will schedule EGD for further evaluation.  The risks, benefits, and alternatives to EGD were discussed with the patient and he consents to proceed.  Patient is questioning gallbladder issue, which is certainly possible but will proceed with EGD first.  Continue omeprazole 20 mg BID for now. -Rectal discomfort:  Improved with anusol suppositories.  No worrisome findings on exam today.  Continue suppositories prn.   CC:  Dettinger, Fransisca Kaufmann, MD

## 2015-11-04 NOTE — Patient Instructions (Addendum)
You have been scheduled for an endoscopy. Please follow written instructions given to you at your visit today. If you use inhalers (even only as needed), please bring them with you on the day of your procedure. Your physician has requested that you go to www.startemmi.com and enter the access code given to you at your visit today. This web site gives a general overview about your procedure. However, you should still follow specific instructions given to you by our office regarding your preparation for the procedure.     If you are age 48 or younger, your body mass index should be between 19-25. Your Body mass index is 33 kg/m. If this is out of the aformentioned range listed, please consider follow up with your Primary Care Provider.

## 2015-11-19 DIAGNOSIS — Z9689 Presence of other specified functional implants: Secondary | ICD-10-CM | POA: Diagnosis not present

## 2015-11-19 DIAGNOSIS — G894 Chronic pain syndrome: Secondary | ICD-10-CM | POA: Diagnosis not present

## 2015-11-19 DIAGNOSIS — Z79891 Long term (current) use of opiate analgesic: Secondary | ICD-10-CM | POA: Diagnosis not present

## 2015-11-19 DIAGNOSIS — M5137 Other intervertebral disc degeneration, lumbosacral region: Secondary | ICD-10-CM | POA: Diagnosis not present

## 2015-11-19 DIAGNOSIS — M961 Postlaminectomy syndrome, not elsewhere classified: Secondary | ICD-10-CM | POA: Diagnosis not present

## 2015-11-25 ENCOUNTER — Encounter: Payer: Self-pay | Admitting: Gastroenterology

## 2015-11-25 ENCOUNTER — Ambulatory Visit (AMBULATORY_SURGERY_CENTER): Payer: Medicare Other | Admitting: Gastroenterology

## 2015-11-25 VITALS — BP 114/64 | HR 61 | Temp 99.1°F | Resp 15 | Ht 73.0 in | Wt 250.0 lb

## 2015-11-25 DIAGNOSIS — K219 Gastro-esophageal reflux disease without esophagitis: Secondary | ICD-10-CM | POA: Diagnosis not present

## 2015-11-25 DIAGNOSIS — R1013 Epigastric pain: Secondary | ICD-10-CM

## 2015-11-25 DIAGNOSIS — R0789 Other chest pain: Secondary | ICD-10-CM | POA: Diagnosis not present

## 2015-11-25 DIAGNOSIS — I10 Essential (primary) hypertension: Secondary | ICD-10-CM | POA: Diagnosis not present

## 2015-11-25 MED ORDER — SODIUM CHLORIDE 0.9 % IV SOLN: 500.0000 mL | INTRAVENOUS | Status: DC

## 2015-11-25 NOTE — Op Note (Signed)
[facility] Patient Name: Jeff Stewart Procedure Date: 11/25/2015 9:41 AM MRN: [id number] Endoscopist: Ladene Artist , MD Age: 60 Referring MD:  Date of Birth: Mar 26, 1955 Gender: Male Account #: [id number] Procedure:                Upper GI endoscopy Indications:              Epigastric abdominal pain, Unexplained chest pain Medicines:                Monitored Anesthesia Care Procedure:                Pre-Anesthesia Assessment:                           - Prior to the procedure, a History and Physical                            was performed, and patient medications and                            allergies were reviewed. The patient's tolerance of                            previous anesthesia was also reviewed. The risks                            and benefits of the procedure and the sedation                            options and risks were discussed with the patient.                            All questions were answered, and informed consent                            was obtained. Prior Anticoagulants: The patient has                            taken no previous anticoagulant or antiplatelet                            agents. ASA Grade Assessment: II - A patient with                            mild systemic disease. After reviewing the risks                            and benefits, the patient was deemed in                            satisfactory condition to undergo the procedure.                           After obtaining informed consent, the endoscope was  passed under direct vision. Throughout the                            procedure, the patient's blood pressure, pulse, and                            oxygen saturations were monitored continuously. The                            Model GIF-[id number]) scope was introduced                            through the mouth, and advanced to the second part                            of  duodenum. The upper GI endoscopy was                            accomplished without difficulty. The patient                            tolerated the procedure well. Scope In: Scope Out: Findings:                 The examined esophagus was normal.                           Evidence of a Nissen fundoplication was found in                            the cardia. This was traversed.                           The exam of the stomach was otherwise normal.                           The duodenal bulb and second portion of the                            duodenum were normal. Complications:            No immediate complications. Estimated Blood Loss:     Estimated blood loss: none. Impression:               - Normal esophagus.                           - A Nissen fundoplication was found.                           - Normal duodenal bulb and second portion of the                            duodenum.                           - No specimens collected. Recommendation:           -  Patient has a contact number available for                            emergencies. The signs and symptoms of potential                            delayed complications were discussed with the                            patient. Return to normal activities tomorrow.                            Written discharge instructions were provided to the                            patient.                           - Resume previous diet.                           - Continue present medications.                           - Perform an abdominal ultrasound at the next                            available appointment. Ladene Artist, MD 11/25/2015 9:54:12 AM This report has been signed electronically.

## 2015-11-25 NOTE — Progress Notes (Signed)
Report to PACU, RN, vss, BBS= Clear.  

## 2015-11-25 NOTE — Patient Instructions (Signed)
Normal exam. Resume current medications. Appointment for abdominal ultrasound at the next available. Office nurse will call you with appointment. Call us with any questions or concerns. Thank you!   YOU HAD AN ENDOSCOPIC PROCEDURE TODAY AT [facility]:   Refer to the procedure report that was given to you for any specific questions about what was found during the examination.  If the procedure report does not answer your questions, please call your gastroenterologist to clarify.  If you requested that your care partner not be given the details of your procedure findings, then the procedure report has been included in a sealed envelope for you to review at your convenience later.  YOU SHOULD EXPECT: Some feelings of bloating in the abdomen. Passage of more gas than usual.  Walking can help get rid of the air that was put into your GI tract during the procedure and reduce the bloating. If you had a lower endoscopy (such as a colonoscopy or flexible sigmoidoscopy) you may notice spotting of blood in your stool or on the toilet paper. If you underwent a bowel prep for your procedure, you may not have a normal bowel movement for a few days.  Please Note:  You might notice some irritation and congestion in your nose or some drainage.  This is from the oxygen used during your procedure.  There is no need for concern and it should clear up in a day or so.  SYMPTOMS TO REPORT IMMEDIATELY:   Following upper endoscopy (EGD)  Vomiting of blood or coffee ground material  New chest pain or pain under the shoulder blades  Painful or persistently difficult swallowing  New shortness of breath  Fever of 100F or higher  Black, tarry-looking stools  For urgent or emergent issues, a gastroenterologist can be reached at any hour by calling [phone].   DIET:  We do recommend a small meal at first, but then you may proceed to your regular diet.  Drink plenty of fluids but you should avoid  alcoholic beverages for 24 hours.  ACTIVITY:  You should plan to take it easy for the rest of today and you should NOT DRIVE or use heavy machinery until tomorrow (because of the sedation medicines used during the test).    FOLLOW UP: Our staff will call the number listed on your records the next business day following your procedure to check on you and address any questions or concerns that you may have regarding the information given to you following your procedure. If we do not reach you, we will leave a message.  However, if you are feeling well and you are not experiencing any problems, there is no need to return our call.  We will assume that you have returned to your regular daily activities without incident.  If any biopsies were taken you will be contacted by phone or by letter within the next 1-3 weeks.  Please call us at [phone] if you have not heard about the biopsies in 3 weeks.    SIGNATURES/CONFIDENTIALITY: You and/or your care partner have signed paperwork which will be entered into your electronic medical record.  These signatures attest to the fact that that the information above on your After Visit Summary has been reviewed and is understood.  Full responsibility of the confidentiality of this discharge information lies with you and/or your care-partner.

## 2015-11-25 NOTE — Progress Notes (Signed)
Pt is unsure of his family history of colon, esophageal, stomach and rectal cancer.  But states, "cancer runs in my family".  Pt said 2 brother and 1 sister had lung cancer.  He said his father had cancer but does not know what kind. Has 1 sister with melanoma. maw

## 2015-11-26 ENCOUNTER — Telehealth: Payer: Self-pay | Admitting: *Deleted

## 2015-11-26 NOTE — Telephone Encounter (Signed)
  Follow up Call-  Call back number 11/25/2015  Post procedure Call Back phone  # [phone] cell  Permission to leave phone message Yes  Some recent data might be hidden     Patient questions:  Do you have a fever, pain , or abdominal swelling? No. Pain Score  0 *  Have you tolerated food without any problems? Yes.    Have you been able to return to your normal activities? Yes.    Do you have any questions about your discharge instructions: Diet   No. Medications  No. Follow up visit  No.  Do you have questions or concerns about your Care? No.  Actions: * If pain score is 4 or above: No action needed, pain <4.

## 2015-12-14 ENCOUNTER — Other Ambulatory Visit: Payer: Self-pay

## 2015-12-14 MED ORDER — "SYRINGE 21G X 1-1/4"" 3 ML MISC": 1.0000 | 0 refills | Status: DC

## 2015-12-14 MED ORDER — TESTOSTERONE CYPIONATE 200 MG/ML IM SOLN: 100.0000 mg | INTRAMUSCULAR | 0 refills | Status: DC

## 2015-12-24 ENCOUNTER — Other Ambulatory Visit: Payer: Self-pay | Admitting: "Endocrinology

## 2015-12-24 DIAGNOSIS — E291 Testicular hypofunction: Secondary | ICD-10-CM | POA: Diagnosis not present

## 2015-12-24 LAB — CBC WITH DIFFERENTIAL/PLATELET
BASOS ABS: 0 {cells}/uL (ref 0–200)
Basophils Relative: 0 %
Eosinophils Absolute: 94 cells/uL (ref 15–500)
Eosinophils Relative: 2 %
HEMATOCRIT: 45.2 % (ref 38.5–50.0)
HEMOGLOBIN: 14.7 g/dL (ref 13.2–17.1)
LYMPHS ABS: 1457 {cells}/uL (ref 850–3900)
Lymphocytes Relative: 31 %
MCH: 27.8 pg (ref 27.0–33.0)
MCHC: 32.5 g/dL (ref 32.0–36.0)
MCV: 85.4 fL (ref 80.0–100.0)
MONO ABS: 470 {cells}/uL (ref 200–950)
MPV: 10.5 fL (ref 7.5–12.5)
Monocytes Relative: 10 %
NEUTROS ABS: 2679 {cells}/uL (ref 1500–7800)
NEUTROS PCT: 57 %
Platelets: 149 10*3/uL (ref 140–400)
RBC: 5.29 MIL/uL (ref 4.20–5.80)
RDW: 14.2 % (ref 11.0–15.0)
WBC: 4.7 10*3/uL (ref 3.8–10.8)

## 2015-12-25 LAB — HEPATIC FUNCTION PANEL
ALT: 19 U/L (ref 9–46)
AST: 23 U/L (ref 10–35)
Albumin: 4.1 g/dL (ref 3.6–5.1)
Alkaline Phosphatase: 87 U/L (ref 40–115)
Bilirubin, Direct: 0.1 mg/dL (ref <=0.2)
Indirect Bilirubin: 0.5 mg/dL (ref 0.2–1.2)
TOTAL PROTEIN: 6.9 g/dL (ref 6.1–8.1)
Total Bilirubin: 0.6 mg/dL (ref 0.2–1.2)

## 2015-12-25 LAB — TESTOSTERONE: TESTOSTERONE: 286 ng/dL (ref 250–827)

## 2015-12-31 ENCOUNTER — Ambulatory Visit: Payer: Medicare Other | Admitting: "Endocrinology

## 2016-01-13 ENCOUNTER — Other Ambulatory Visit: Payer: Self-pay | Admitting: Family Medicine

## 2016-01-13 ENCOUNTER — Ambulatory Visit (INDEPENDENT_AMBULATORY_CARE_PROVIDER_SITE_OTHER): Payer: Medicare Other | Admitting: "Endocrinology

## 2016-01-13 ENCOUNTER — Encounter: Payer: Self-pay | Admitting: "Endocrinology

## 2016-01-13 VITALS — BP 139/84 | HR 69 | Ht 73.0 in | Wt 255.0 lb

## 2016-01-13 DIAGNOSIS — E291 Testicular hypofunction: Secondary | ICD-10-CM | POA: Diagnosis not present

## 2016-01-13 DIAGNOSIS — I10 Essential (primary) hypertension: Secondary | ICD-10-CM | POA: Diagnosis not present

## 2016-01-13 NOTE — Progress Notes (Signed)
Subjective:    Patient ID: Jeff Stewart, male    DOB: April 19, 1955, PCP Fransisca Kaufmann Dettinger, MD   Past Medical History:  Diagnosis Date  . Anxiety   . Carpal tunnel syndrome, bilateral   . Cataract   . Chronic back pain    internal morphine pump  . DDD (degenerative disc disease)    neck, lumbar  . Depression   . Dyslipidemia    diet controlled  . Hypertension    borderline  . Sleep apnea    wears C-PAP   Past Surgical History:  Procedure Laterality Date  . BACK SURGERY     x 6  . CARPAL TUNNEL RELEASE     bilateral  . COLONOSCOPY    . ELBOW SURGERY    . INGUINAL HERNIA REPAIR Right   . KNEE ARTHROSCOPY    . NASAL SEPTUM SURGERY     x 6  . neck fusion     . NISSEN FUNDOPLICATION    . STOMACH SURGERY    . UPPER GASTROINTESTINAL ENDOSCOPY     Social History   Social History  . Marital status: Married    Spouse name: N/A  . Number of children: 3  . Years of education: N/A   Occupational History  . 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 Smoking status: Never Smoker  . Smokeless tobacco: Never Used  . Alcohol use No  . Drug use: No  . Sexual activity: Yes    Birth control/ protection: Post-menopausal     Comment: married for 1972   Other Topics Concern  . None   Social History Narrative   Lives with wife    Outpatient Encounter Prescriptions as of 01/13/2016  Medication Sig  . hydrocortisone (ANUSOL-HC) 25 MG suppository Place 1 suppository (25 mg total) rectally 4 (four) times daily as needed for hemorrhoids.  Marland Kitchen losartan (COZAAR) 50 MG tablet Take 1 tablet (50 mg total) by mouth 2 (two) times daily.  Marland Kitchen morphine (MSIR) 30 MG tablet Take 30 mg by mouth at bedtime as needed.  Marland Kitchen morphine (ROXANOL) 20 MG/ML concentrated solution Take by mouth. pump  . omeprazole (PRILOSEC) 20 MG capsule TAKE  (1)  CAPSULE  TWICE DAILY (TAKE ON AN EMPTY STOMACH AT LEAST 30MIN- UTES BEFORE MEALS).  Marland Kitchen sertraline (ZOLOFT) 100 MG tablet Take 100  mg by mouth daily.   . Syringe/Needle, Disp, (SYRINGE 3CC/21GX1-1/4") 21G X 1-1/4" 3 ML MISC 1 each by Does not apply route once a week.  . testosterone cypionate (DEPOTESTOSTERONE CYPIONATE) 200 MG/ML injection Inject 0.5 mLs (100 mg total) into the muscle once a week.   Facility-Administered Encounter Medications as of 01/13/2016  Medication  . 0.9 %  sodium chloride infusion   ALLERGIES: Allergies  Allergen Reactions  . Nubain [Nalbuphine Hcl] Rash   VACCINATION STATUS: Immunization History  Administered Date(s) Administered  . Influenza,inj,Quad PF,36+ Mos 01/14/2015    HPI Mr. Abercrombie is 60 year old gentleman with above medical history. He is here to follow-up with labs for hypogonadism. - He is on testosterone 100 mg IM weekly. He feels much better. He is here to follow-up with repeat labs showing Continued improvement in his testosterone to 286. He was known to have low testosterone for at least a year and half.   He fathers 3 grown children. He denies history of head injury , has had nasal septal surgery multiple times. He denies injury to the testicles,  exposure to chemotherapy, exposure to radiation to the genitals. However, due to chronic back injury and surgery he has exposure to heavy opioid therapy for pain control. He is currently on morphine pump. Overall on pain control for the last 10-15 years .   Review of Systems Constitutional: He is gaining weight, no subjective hyperthermia/hypothermia Eyes: no blurry vision, no xerophthalmia ENT: no sore throat, no nodules palpated in throat, no dysphagia/odynophagia, no hoarseness Cardiovascular: no CP/SOB/palpitations/leg swelling Respiratory: no cough/SOB Gastrointestinal: no N/V/D/C Musculoskeletal: no muscle/joint aches Skin: no rashes,  Genitourinary: Reports improved libido  Neurological: no tremors/numbness/tingling/dizziness Psychiatric: no depression/anxiety  Objective:    BP 139/84   Pulse 69   Ht 6\' 1"   (1.854 m)   Wt 255 lb (115.7 kg)   BMI 33.64 kg/m   Wt Readings from Last 3 Encounters:  01/13/16 255 lb (115.7 kg)  11/25/15 250 lb (113.4 kg)  11/04/15 250 lb 2 oz (113.5 kg)    Physical Exam   Constitutional: overweight, in NAD Eyes: PERRLA, EOMI, no exophthalmos ENT: moist mucous membranes, no thyromegaly, no cervical lymphadenopathy Cardiovascular: RRR, No MRG Respiratory: CTA B Gastrointestinal: abdomen soft, NT, ND, BS+, rectal exam deferred (patient stated he had rectal exam last year by his primary medical doctor, reportedly no prostate enlargement) Genitourinary system: He has bilaterally shrunk testicles, right testes 12 mL, left testes 15 mL. Normal scrotal mass, normal venous, no inguinal lymphadenopathy. Musculoskeletal: Large extremities patient explains for medial, no deformities, strength intact in all 4 Skin: moist, warm, no rashes Neurological: no tremor with outstretched hands, DTR normal in all 4   Recent Results (from the past 2160 hour(s))  CBC with Differential/Platelet     Status: None   Collection Time: 12/24/15 10:08 AM  Result Value Ref Range   WBC 4.7 3.8 - 10.8 K/uL   RBC 5.29 4.20 - 5.80 MIL/uL   Hemoglobin 14.7 13.2 - 17.1 g/dL   HCT 02/23/16 69.7 - 19.5 %   MCV 85.4 80.0 - 100.0 fL   MCH 27.8 27.0 - 33.0 pg   MCHC 32.5 32.0 - 36.0 g/dL   RDW 38.6 73.0 - 52.0 %   Platelets 149 140 - 400 K/uL   MPV 10.5 7.5 - 12.5 fL   Neutro Abs 2,679 1,500 - 7,800 cells/uL   Lymphs Abs 1,457 850 - 3,900 cells/uL   Monocytes Absolute 470 200 - 950 cells/uL   Eosinophils Absolute 94 15 - 500 cells/uL   Basophils Absolute 0 0 - 200 cells/uL   Neutrophils Relative % 57 %   Lymphocytes Relative 31 %   Monocytes Relative 10 %   Eosinophils Relative 2 %   Basophils Relative 0 %   Smear Review Criteria for review not met   Hepatic function panel     Status: None   Collection Time: 12/24/15 10:08 AM  Result Value Ref Range   Total Bilirubin 0.6 0.2 - 1.2 mg/dL    Bilirubin, Direct 0.1 <=0.2 mg/dL   Indirect Bilirubin 0.5 0.2 - 1.2 mg/dL   Alkaline Phosphatase 87 40 - 115 U/L   AST 23 10 - 35 U/L   ALT 19 9 - 46 U/L   Total Protein 6.9 6.1 - 8.1 g/dL   Albumin 4.1 3.6 - 5.1 g/dL  Testosterone     Status: None   Collection Time: 12/24/15 10:08 AM  Result Value Ref Range   Testosterone 286 250 - 827 ng/dL    Comment: Men with clinically significant hypogonadal symptoms and  testosterone values repeatedly less than approximately 300 ng/dL may benefit from testosterone treatment after adequate risk and benefits counseling.      Assessment & Plan:   1. Hypogonadism, male  -Testosterone replacement helped improve his testosterone from 74 to 286. No adverse events noted. 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  -It is possible that it is multifactorial including exposure to heavy opioids, multiple back surgeries, prior inguinal surgery, or idiopathic.  -Prolactin level is favorable at 6.1, he would not need pituitary/sella imaging for now.  -He will benefit from continued replacement of testosterone. -I advised him to continue 100 mg of testosterone cypionate IM weekly for target testosterone level of 400-600 mcg/dL.  -He will return in 4 months with lab work for CBC total testosterone, transaminases . -He is advised to maintain his annual physical with his primary medical doctor and obtain PSA.    - I advised patient to maintain close follow up with Worthy Rancher, MD for primary care needs. Follow up plan: Return in about 3 months (around 04/14/2016) for follow up with pre-visit labs.  Glade Lloyd, MD Phone: [phone]  Fax: [phone]   01/13/2016, 9:02 AM

## 2016-02-02 ENCOUNTER — Encounter: Payer: Self-pay | Admitting: Family Medicine

## 2016-02-02 ENCOUNTER — Ambulatory Visit (INDEPENDENT_AMBULATORY_CARE_PROVIDER_SITE_OTHER): Payer: Medicare Other | Admitting: Family Medicine

## 2016-02-02 VITALS — BP 145/90 | HR 67 | Temp 97.8°F | Ht 73.0 in | Wt 258.0 lb

## 2016-02-02 DIAGNOSIS — Z23 Encounter for immunization: Secondary | ICD-10-CM

## 2016-02-02 DIAGNOSIS — R109 Unspecified abdominal pain: Secondary | ICD-10-CM | POA: Diagnosis not present

## 2016-02-02 DIAGNOSIS — I10 Essential (primary) hypertension: Secondary | ICD-10-CM | POA: Diagnosis not present

## 2016-02-02 LAB — URINALYSIS, COMPLETE
BILIRUBIN UA: NEGATIVE
GLUCOSE, UA: NEGATIVE
KETONES UA: NEGATIVE
Leukocytes, UA: NEGATIVE
NITRITE UA: NEGATIVE
PROTEIN UA: NEGATIVE
RBC UA: NEGATIVE
Specific Gravity, UA: 1.01 (ref 1.005–1.030)
UUROB: 0.2 mg/dL (ref 0.2–1.0)
pH, UA: 6.5 (ref 5.0–7.5)

## 2016-02-02 LAB — MICROSCOPIC EXAMINATION
BACTERIA UA: NONE SEEN
Epithelial Cells (non renal): NONE SEEN /hpf (ref 0–10)
RBC MICROSCOPIC, UA: NONE SEEN /HPF (ref 0–?)
WBC UA: NONE SEEN /HPF (ref 0–?)

## 2016-02-02 MED ORDER — HYDROCHLOROTHIAZIDE 25 MG PO TABS
25.0000 mg | ORAL_TABLET | Freq: Every day | ORAL | 1 refills | Status: DC
Start: 2016-02-02 — End: 2016-06-24

## 2016-02-02 NOTE — Progress Notes (Signed)
BP (!) 177/103   Pulse 73   Temp 97.8 F (36.6 C) (Oral)   Ht '6\' 1"'$  (1.854 m)   Wt 258 lb (117 kg)   BMI 34.04 kg/m    Subjective:    Patient ID: Jeff Stewart, male    DOB: 18-Oct-1955, 60 y.o.   MRN: [id number]  HPI: Jeff Stewart is a 60 y.o. male presenting on 02/02/2016 for BP elevated recently and Nausea and dizziness   HPI Blood pressure elevated and dizziness and headache Patient has been having elevated blood pressure and dizziness and headache this been intermittent over the past couple weeks. He says he checked his blood pressure the other day at home and it was 197/109. Today in office it is 177/103. He denies any chest pain or difficulty breathing or palpitations. He has been taking his Cozaar 50 mg twice a day without missing doses. He is to be on metoprolol previously and did not do well with that. One episode for a couple days he had more increased right flank pain than he usually does with his back pain but that has resolved but he doesn't know if he still having urinary issues or not. He denies any fevers or chills. He denies any burning when he urinates.  Relevant past medical, surgical, family and social history reviewed and updated as indicated. Interim medical history since our last visit reviewed. Allergies and medications reviewed and updated.  Review of Systems  Constitutional: Negative for chills and fever.  Eyes: Negative for discharge.  Respiratory: Negative for shortness of breath and wheezing.   Cardiovascular: Negative for chest pain, palpitations and leg swelling.  Genitourinary: Positive for flank pain.  Musculoskeletal: Negative for back pain and gait problem.  Skin: Negative for rash.  Neurological: Positive for dizziness and headaches. Negative for speech difficulty, weakness and light-headedness.  All other systems reviewed and are negative.   Per HPI unless specifically indicated above     Objective:    BP (!) 177/103   Pulse 73    Temp 97.8 F (36.6 C) (Oral)   Ht '6\' 1"'$  (1.854 m)   Wt 258 lb (117 kg)   BMI 34.04 kg/m   Wt Readings from Last 3 Encounters:  02/02/16 258 lb (117 kg)  01/13/16 255 lb (115.7 kg)  11/25/15 250 lb (113.4 kg)    Physical Exam  Constitutional: He is oriented to person, place, and time. He appears well-developed and well-nourished. No distress.  Eyes: Conjunctivae are normal. Right eye exhibits no discharge. Left eye exhibits no discharge. No scleral icterus.  Cardiovascular: Normal rate, regular rhythm, normal heart sounds and intact distal pulses.   No murmur heard. Pulmonary/Chest: Effort normal and breath sounds normal. No respiratory distress. He has no wheezes. He has no rales.  Abdominal: Soft. Bowel sounds are normal. He exhibits no distension. There is no tenderness. There is no rebound and no guarding.  Musculoskeletal: Normal range of motion. He exhibits no edema.  Neurological: He is alert and oriented to person, place, and time. Coordination normal.  Skin: Skin is warm and dry. No rash noted. He is not diaphoretic.  Psychiatric: He has a normal mood and affect. His behavior is normal.  Nursing note and vitals reviewed.   Urinalysis: Negative urinalysis and micro-except for a small amount of mucus.    Assessment & Plan:   Problem List Items Addressed This Visit      Cardiovascular and Mediastinum   Essential hypertension, benign - Primary  Significantly elevated blood pressure and intermittent headaches, will add hydrochlorothiazide to Cozaar, will check kidneys because of swelling and decreased function of Cozaar all of a sudden.      Relevant Medications   hydrochlorothiazide (HYDRODIURIL) 25 MG tablet   Other Relevant Orders   Urinalysis, Complete   Microalbumin / creatinine urine ratio   CMP14+EGFR    Other Visit Diagnoses    Flank pain       Relevant Orders   Urinalysis, Complete       Follow up plan: Return in about 4 weeks (around 03/01/2016), or  if symptoms worsen or fail to improve, for Hypertension recheck.  Counseling provided for all of the vaccine components Orders Placed This Encounter  Procedures  . Urinalysis, Complete  . Microalbumin / creatinine urine ratio  . Flora Dettinger, MD [facility] 02/02/2016, 2:03 PM

## 2016-02-02 NOTE — Assessment & Plan Note (Signed)
Significantly elevated blood pressure and intermittent headaches, will add hydrochlorothiazide to Cozaar, will check kidneys because of swelling and decreased function of Cozaar all of a sudden.

## 2016-02-03 DIAGNOSIS — Z9689 Presence of other specified functional implants: Secondary | ICD-10-CM | POA: Diagnosis not present

## 2016-02-03 DIAGNOSIS — M5137 Other intervertebral disc degeneration, lumbosacral region: Secondary | ICD-10-CM | POA: Diagnosis not present

## 2016-02-03 DIAGNOSIS — M961 Postlaminectomy syndrome, not elsewhere classified: Secondary | ICD-10-CM | POA: Diagnosis not present

## 2016-02-03 DIAGNOSIS — G894 Chronic pain syndrome: Secondary | ICD-10-CM | POA: Diagnosis not present

## 2016-02-03 LAB — MICROALBUMIN / CREATININE URINE RATIO
Creatinine, Urine: 61.8 mg/dL
MICROALB/CREAT RATIO: 4.9 mg/g{creat} (ref 0.0–30.0)
Microalbumin, Urine: 3 ug/mL

## 2016-02-10 ENCOUNTER — Other Ambulatory Visit: Payer: Medicare Other

## 2016-02-10 DIAGNOSIS — I10 Essential (primary) hypertension: Secondary | ICD-10-CM | POA: Diagnosis not present

## 2016-02-11 LAB — CMP14+EGFR
A/G RATIO: 1.6 (ref 1.2–2.2)
ALBUMIN: 4.1 g/dL (ref 3.6–4.8)
ALK PHOS: 85 IU/L (ref 39–117)
ALT: 18 IU/L (ref 0–44)
AST: 18 IU/L (ref 0–40)
BILIRUBIN TOTAL: 0.4 mg/dL (ref 0.0–1.2)
BUN / CREAT RATIO: 17 (ref 10–24)
BUN: 16 mg/dL (ref 8–27)
CHLORIDE: 99 mmol/L (ref 96–106)
CO2: 25 mmol/L (ref 18–29)
Calcium: 8.8 mg/dL (ref 8.6–10.2)
Creatinine, Ser: 0.94 mg/dL (ref 0.76–1.27)
GFR calc non Af Amer: 87 mL/min/{1.73_m2} (ref >59)
GFR, EST AFRICAN AMERICAN: 100 mL/min/{1.73_m2} (ref >59)
GLUCOSE: 85 mg/dL (ref 65–99)
Globulin, Total: 2.6 g/dL (ref 1.5–4.5)
POTASSIUM: 4.5 mmol/L (ref 3.5–5.2)
SODIUM: 137 mmol/L (ref 134–144)
TOTAL PROTEIN: 6.7 g/dL (ref 6.0–8.5)

## 2016-03-04 DIAGNOSIS — Z135 Encounter for screening for eye and ear disorders: Secondary | ICD-10-CM | POA: Diagnosis not present

## 2016-03-15 DIAGNOSIS — M961 Postlaminectomy syndrome, not elsewhere classified: Secondary | ICD-10-CM | POA: Diagnosis not present

## 2016-03-22 ENCOUNTER — Other Ambulatory Visit: Payer: Self-pay | Admitting: Family Medicine

## 2016-03-22 NOTE — Telephone Encounter (Signed)
Please send request to his PCP 

## 2016-04-13 ENCOUNTER — Other Ambulatory Visit: Payer: Self-pay | Admitting: "Endocrinology

## 2016-04-13 DIAGNOSIS — E291 Testicular hypofunction: Secondary | ICD-10-CM | POA: Diagnosis not present

## 2016-04-13 LAB — CBC WITH DIFFERENTIAL/PLATELET
Basophils Absolute: 48 cells/uL (ref 0–200)
Basophils Relative: 1 %
EOS PCT: 2 %
Eosinophils Absolute: 96 cells/uL (ref 15–500)
HCT: 45.5 % (ref 38.5–50.0)
HEMOGLOBIN: 15 g/dL (ref 13.2–17.1)
LYMPHS ABS: 1488 {cells}/uL (ref 850–3900)
Lymphocytes Relative: 31 %
MCH: 28.5 pg (ref 27.0–33.0)
MCHC: 33 g/dL (ref 32.0–36.0)
MCV: 86.5 fL (ref 80.0–100.0)
MPV: 10.4 fL (ref 7.5–12.5)
Monocytes Absolute: 624 cells/uL (ref 200–950)
Monocytes Relative: 13 %
NEUTROS ABS: 2544 {cells}/uL (ref 1500–7800)
NEUTROS PCT: 53 %
Platelets: 169 10*3/uL (ref 140–400)
RBC: 5.26 MIL/uL (ref 4.20–5.80)
RDW: 14.8 % (ref 11.0–15.0)
WBC: 4.8 10*3/uL (ref 3.8–10.8)

## 2016-04-14 LAB — TESTOSTERONE: TESTOSTERONE: 782 ng/dL (ref 250–827)

## 2016-04-18 ENCOUNTER — Encounter: Payer: Self-pay | Admitting: "Endocrinology

## 2016-04-18 ENCOUNTER — Ambulatory Visit (INDEPENDENT_AMBULATORY_CARE_PROVIDER_SITE_OTHER): Payer: Medicare Other | Admitting: "Endocrinology

## 2016-04-18 VITALS — BP 134/79 | HR 76 | Ht 73.0 in | Wt 252.0 lb

## 2016-04-18 DIAGNOSIS — E291 Testicular hypofunction: Secondary | ICD-10-CM

## 2016-04-18 MED ORDER — TESTOSTERONE CYPIONATE 200 MG/ML IM SOLN: 100.0000 mg | INTRAMUSCULAR | 0 refills | Status: DC

## 2016-04-18 NOTE — Progress Notes (Signed)
Subjective:    Patient ID: Jeff Stewart, male    DOB: 09-25-1955, PCP Fransisca Kaufmann Dettinger, MD   Past Medical History:  Diagnosis Date  . Anxiety   . Carpal tunnel syndrome, bilateral   . Cataract   . Chronic back pain    internal morphine pump  . DDD (degenerative disc disease)    neck, lumbar  . Depression   . Dyslipidemia    diet controlled  . Hypertension    borderline  . Sleep apnea    wears C-PAP   Past Surgical History:  Procedure Laterality Date  . BACK SURGERY     x 6  . CARPAL TUNNEL RELEASE     bilateral  . COLONOSCOPY    . ELBOW SURGERY    . INGUINAL HERNIA REPAIR Right   . KNEE ARTHROSCOPY    . NASAL SEPTUM SURGERY     x 6  . neck fusion     . NISSEN FUNDOPLICATION    . STOMACH SURGERY    . UPPER GASTROINTESTINAL ENDOSCOPY     Social History   Social History  . Marital status: Married    Spouse name: N/A  . Number of children: 3  . Years of education: N/A   Occupational History  . 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 Smoking status: Never Smoker  . Smokeless tobacco: Never Used  . Alcohol use No  . Drug use: No  . Sexual activity: Yes    Birth control/ protection: Post-menopausal     Comment: married for 1972   Other Topics Concern  . None   Social History Narrative   Lives with wife    Outpatient Encounter Prescriptions as of 04/18/2016  Medication Sig  . hydrochlorothiazide (HYDRODIURIL) 25 MG tablet Take 1 tablet (25 mg total) by mouth daily.  . hydrocortisone (ANUSOL-HC) 25 MG suppository Place 1 suppository (25 mg total) rectally 4 (four) times daily as needed for hemorrhoids.  Marland Kitchen losartan (COZAAR) 50 MG tablet Take 1 tablet (50 mg total) by mouth 2 (two) times daily.  Marland Kitchen morphine (MSIR) 30 MG tablet Take 30 mg by mouth at bedtime as needed.  Marland Kitchen morphine (ROXANOL) 20 MG/ML concentrated solution Take by mouth. pump  . omeprazole (PRILOSEC) 20 MG capsule TAKE  (1)  CAPSULE  TWICE DAILY (TAKE ON AN  EMPTY STOMACH AT LEAST 30MIN- UTES BEFORE MEALS).  Marland Kitchen sertraline (ZOLOFT) 100 MG tablet Take 100 mg by mouth daily.   . Syringe/Needle, Disp, (SYRINGE 3CC/21GX1-1/4") 21G X 1-1/4" 3 ML MISC 1 each by Does not apply route once a week.  . testosterone cypionate (DEPOTESTOSTERONE CYPIONATE) 200 MG/ML injection Inject 0.5 mLs (100 mg total) into the muscle once a week.  . [DISCONTINUED] testosterone cypionate (DEPOTESTOSTERONE CYPIONATE) 200 MG/ML injection Inject 0.5 mLs (100 mg total) into the muscle once a week.  . [DISCONTINUED] testosterone cypionate (DEPOTESTOSTERONE CYPIONATE) 200 MG/ML injection Inject 0.5 mLs (100 mg total) into the muscle once a week.   Facility-Administered Encounter Medications as of 04/18/2016  Medication  . 0.9 %  sodium chloride infusion   ALLERGIES: Allergies  Allergen Reactions  . Nubain [Nalbuphine Hcl] Rash   VACCINATION STATUS: Immunization History  Administered Date(s) Administered  . Influenza,inj,Quad PF,36+ Mos 01/14/2015, 02/02/2016    HPI Jeff Stewart is 61 year old gentleman with above medical history. He is here to follow-up with labs for hypogonadism. - He is on testosterone 100 mg IM weekly.  He feels much better. He is here to follow-up with repeat labs showing Continued improvement in his testosterone to 286, now to 782. He was known to have low testosterone for at least since age 17.   He fathers 3 grown children. He denies history of head injury , has had nasal septal surgery multiple times. He denies injury to the testicles, exposure to chemotherapy, exposure to radiation to the genitals. However, due to chronic back injury and surgery he has exposure to heavy opioid therapy for pain control. He is currently on morphine pump. Overall on pain control for the last 10-15 years .   Review of Systems Constitutional: He is gaining weight, no subjective hyperthermia/hypothermia Eyes: no blurry vision, no xerophthalmia ENT: no sore throat, no  nodules palpated in throat, no dysphagia/odynophagia, no hoarseness Cardiovascular: no CP/SOB/palpitations/leg swelling Respiratory: no cough/SOB Gastrointestinal: no N/V/D/C Musculoskeletal: no muscle/joint aches Skin: no rashes,  Genitourinary: Reports improved libido  Neurological: no tremors/numbness/tingling/dizziness Psychiatric: no depression/anxiety  Objective:    BP 134/79   Pulse 76   Ht 6' 1" (1.854 m)   Wt 252 lb (114.3 kg)   BMI 33.25 kg/m   Wt Readings from Last 3 Encounters:  04/18/16 252 lb (114.3 kg)  02/02/16 258 lb (117 kg)  01/13/16 255 lb (115.7 kg)    Physical Exam   Constitutional: overweight, in NAD Eyes: PERRLA, EOMI, no exophthalmos ENT: moist mucous membranes, no thyromegaly, no cervical lymphadenopathy Cardiovascular: RRR, No MRG Respiratory: CTA B Gastrointestinal: abdomen soft, NT, ND, BS+, rectal exam deferred (patient stated he had rectal exam last year by his primary medical doctor, reportedly no prostate enlargement) Genitourinary system: He has bilaterally shrunk testicles, right testes 12 mL, left testes 15 mL. Normal scrotal mass, normal venous, no inguinal lymphadenopathy. Musculoskeletal: Large extremities patient explains for medial, no deformities, strength intact in all 4 Skin: moist, warm, no rashes Neurological: no tremor with outstretched hands, DTR normal in all 4   Recent Results (from the past 2160 hour(s))  Urinalysis, Complete     Status: None   Collection Time: 02/02/16  2:05 PM  Result Value Ref Range   Specific Gravity, UA 1.010 1.005 - 1.030   pH, UA 6.5 5.0 - 7.5   Color, UA Yellow Yellow   Appearance Ur Clear Clear   Leukocytes, UA Negative Negative   Protein, UA Negative Negative/Trace   Glucose, UA Negative Negative   Ketones, UA Negative Negative   RBC, UA Negative Negative   Bilirubin, UA Negative Negative   Urobilinogen, Ur 0.2 0.2 - 1.0 mg/dL   Nitrite, UA Negative Negative   Microscopic Examination See  below:   Microalbumin / creatinine urine ratio     Status: None   Collection Time: 02/02/16  2:05 PM  Result Value Ref Range   Creatinine, Urine 61.8 Not Estab. mg/dL   Albumin, Urine 3.0 Not Estab. ug/mL   Microalb/Creat Ratio 4.9 0.0 - 30.0 mg/g creat  Microscopic Examination     Status: None   Collection Time: 02/02/16  2:05 PM  Result Value Ref Range   WBC, UA None seen 0 - 5 /hpf   RBC, UA None seen 0 - 2 /hpf   Epithelial Cells (non renal) None seen 0 - 10 /hpf   Mucus, UA Present Not Estab.   Bacteria, UA None seen None seen/Few  CMP14+EGFR     Status: None   Collection Time: 02/10/16 12:59 PM  Result Value Ref Range   Glucose 85 65 -  99 mg/dL   BUN 16 8 - 27 mg/dL   Creatinine, Ser 0.94 0.76 - 1.27 mg/dL   GFR calc non Af Amer 87 >59 mL/min/1.73   GFR calc Af Amer 100 >59 mL/min/1.73   BUN/Creatinine Ratio 17 10 - 24   Sodium 137 134 - 144 mmol/L   Potassium 4.5 3.5 - 5.2 mmol/L   Chloride 99 96 - 106 mmol/L   CO2 25 18 - 29 mmol/L   Calcium 8.8 8.6 - 10.2 mg/dL   Total Protein 6.7 6.0 - 8.5 g/dL   Albumin 4.1 3.6 - 4.8 g/dL   Globulin, Total 2.6 1.5 - 4.5 g/dL   Albumin/Globulin Ratio 1.6 1.2 - 2.2   Bilirubin Total 0.4 0.0 - 1.2 mg/dL   Alkaline Phosphatase 85 39 - 117 IU/L   AST 18 0 - 40 IU/L   ALT 18 0 - 44 IU/L  CBC with Differential/Platelet     Status: None   Collection Time: 04/13/16  9:00 AM  Result Value Ref Range   WBC 4.8 3.8 - 10.8 K/uL   RBC 5.26 4.20 - 5.80 MIL/uL   Hemoglobin 15.0 13.2 - 17.1 g/dL   HCT 45.5 38.5 - 50.0 %   MCV 86.5 80.0 - 100.0 fL   MCH 28.5 27.0 - 33.0 pg   MCHC 33.0 32.0 - 36.0 g/dL   RDW 14.8 11.0 - 15.0 %   Platelets 169 140 - 400 K/uL   MPV 10.4 7.5 - 12.5 fL   Neutro Abs 2,544 1,500 - 7,800 cells/uL   Lymphs Abs 1,488 850 - 3,900 cells/uL   Monocytes Absolute 624 200 - 950 cells/uL   Eosinophils Absolute 96 15 - 500 cells/uL   Basophils Absolute 48 0 - 200 cells/uL   Neutrophils Relative % 53 %   Lymphocytes  Relative 31 %   Monocytes Relative 13 %   Eosinophils Relative 2 %   Basophils Relative 1 %   Smear Review Criteria for review not met   Testosterone     Status: None   Collection Time: 04/13/16  9:00 AM  Result Value Ref Range   Testosterone 782 250 - 827 ng/dL     Assessment & Plan:   1. Hypogonadism, male  -Testosterone replacement helped improve his testosterone from 67 ->286 -> 782.  No adverse events noted. 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  -It is possible that it is multifactorial including exposure to heavy opioids, multiple back surgeries, prior inguinal surgery, or idiopathic.  -Prolactin level is favorable at 6.1, he would not need pituitary/sella imaging for now.  -He will benefit from continued replacement of testosterone. -I advised him to continue 100 mg of testosterone cypionate IM weekly for target testosterone level of 400-600 mcg/dL.  -He will return in 4 months with lab work for CBC total testosterone, transaminases . - If he is total testosterone level remains about 600 g per DL next visit, he will be considered for  testosterone100 mg IM every other week. -He is advised to maintain his annual physical with his primary medical doctor and obtain PSA.    - I advised patient to maintain close follow up with Worthy Rancher, MD for primary care needs. Follow up plan: Return in about 3 months (around 07/17/2016) for follow up with pre-visit labs.  Glade Lloyd, MD Phone: [phone]  Fax: [phone]   04/18/2016, 11:39 AM

## 2016-04-20 ENCOUNTER — Ambulatory Visit (INDEPENDENT_AMBULATORY_CARE_PROVIDER_SITE_OTHER): Payer: Medicare Other

## 2016-04-20 ENCOUNTER — Encounter: Payer: Self-pay | Admitting: Family Medicine

## 2016-04-20 ENCOUNTER — Ambulatory Visit (INDEPENDENT_AMBULATORY_CARE_PROVIDER_SITE_OTHER): Payer: Medicare Other | Admitting: Family Medicine

## 2016-04-20 VITALS — BP 136/84 | HR 70 | Temp 97.1°F | Ht 73.0 in | Wt 251.4 lb

## 2016-04-20 DIAGNOSIS — R2231 Localized swelling, mass and lump, right upper limb: Secondary | ICD-10-CM

## 2016-04-20 DIAGNOSIS — R0981 Nasal congestion: Secondary | ICD-10-CM | POA: Diagnosis not present

## 2016-04-20 MED ORDER — FLUTICASONE PROPIONATE 50 MCG/ACT NA SUSP: 1.0000 | Freq: Two times a day (BID) | NASAL | 6 refills | Status: DC | PRN

## 2016-04-20 MED ORDER — NAPROXEN 500 MG PO TABS: 500.0000 mg | ORAL_TABLET | Freq: Two times a day (BID) | ORAL | 0 refills | Status: DC

## 2016-04-20 NOTE — Progress Notes (Signed)
BP 136/84   Pulse 70   Temp 97.1 F (36.2 C) (Oral)   Ht 6\' 1"  (1.854 m)   Wt 251 lb 6.4 oz (114 kg)   BMI 33.17 kg/m    Subjective:    Patient ID: Jeff Stewart, male    DOB: 20-Aug-1955, 61 y.o.   MRN: [id number]  HPI: Jeff Stewart is a 61 y.o. male presenting on 04/20/2016 for right hand swollen   HPI Right hand swelling and pain Patient has been having right hand swelling and pain that's been going on for the past week and a half. He thinks he may have hit his hand when he was working garage on a board but did not have any necessarily intense pain at that point. Since that time the swelling has persisted and he has been icing it trying to get to reduce but does not seem to be going down. He denies any grip strength or loss of range of motion or decreased sensation in the hand but it is just swollen about 1-1/2 times larger than the other hand. The pain that he is having he rates as 7 out of 10 and is on the dorsal aspect of his hand overlying the third and fourth rays.  Sinus congestion Patient has been having sinus congestion and pressure and headache that's been going on for the past few weeks. He gets this recurrently and has had multiple surgeries on his sinuses to try and open these up. Currently he is using Tylenol Sinus and cold medication which is not helping much. He denies any fevers or chills or shortness of breath or wheezing or chest congestion. He says is mostly just the sinus headache that's bothering him.  Relevant past medical, surgical, family and social history reviewed and updated as indicated. Interim medical history since our last visit reviewed. Allergies and medications reviewed and updated.  Review of Systems  Constitutional: Negative for chills and fever.  HENT: Positive for sinus pain and sinus pressure. Negative for congestion, sneezing and sore throat.   Respiratory: Negative for cough, shortness of breath and wheezing.   Cardiovascular: Negative  for chest pain and leg swelling.  Musculoskeletal: Positive for joint swelling. Negative for arthralgias, back pain and gait problem.  Skin: Negative for rash.  Neurological: Positive for headaches. Negative for dizziness.  All other systems reviewed and are negative.   Per HPI unless specifically indicated above     Objective:    BP 136/84   Pulse 70   Temp 97.1 F (36.2 C) (Oral)   Ht 6\' 1"  (1.854 m)   Wt 251 lb 6.4 oz (114 kg)   BMI 33.17 kg/m   Wt Readings from Last 3 Encounters:  04/20/16 251 lb 6.4 oz (114 kg)  04/18/16 252 lb (114.3 kg)  02/02/16 258 lb (117 kg)    Physical Exam  Constitutional: He is oriented to person, place, and time. He appears well-developed and well-nourished. No distress.  HENT:  Right Ear: External ear normal.  Left Ear: External ear normal.  Nose: Right sinus exhibits maxillary sinus tenderness. Left sinus exhibits maxillary sinus tenderness.  Mouth/Throat: Oropharynx is clear and moist. No oropharyngeal exudate.  Eyes: Conjunctivae are normal. Right eye exhibits no discharge. Left eye exhibits no discharge. No scleral icterus.  Musculoskeletal: Normal range of motion. He exhibits no edema.       Left hand: He exhibits tenderness and swelling. He exhibits normal capillary refill and no deformity. Normal sensation noted. Normal  strength noted.       Hands: Neurological: He is alert and oriented to person, place, and time. Coordination normal.  Skin: Skin is warm and dry. No rash noted. He is not diaphoretic.  Psychiatric: He has a normal mood and affect. His behavior is normal.  Nursing note and vitals reviewed.  Right hand x-ray: No signs of acute bony abnormality, await final read by radiologist    Assessment & Plan:   Problem List Items Addressed This Visit    None    Visit Diagnoses    Localized swelling on right hand    -  Primary   X-ray looks normal, likely secondary to either gout which is improving or soft tissue trauma, will  give anti-inflammatory   Relevant Medications   naproxen (NAPROSYN) 500 MG tablet   Other Relevant Orders   DG Hand Complete Right (Completed)   Sinus congestion       Relevant Medications   fluticasone (FLONASE) 50 MCG/ACT nasal spray       Follow up plan: Return if symptoms worsen or fail to improve.  Counseling provided for all of the vaccine components Orders Placed This Encounter  Procedures  . DG Hand Complete Right    Caryl Pina, MD [facility] 04/20/2016, 8:26 AM

## 2016-04-25 ENCOUNTER — Other Ambulatory Visit: Payer: Self-pay | Admitting: Family Medicine

## 2016-04-28 DIAGNOSIS — G894 Chronic pain syndrome: Secondary | ICD-10-CM | POA: Diagnosis not present

## 2016-04-28 DIAGNOSIS — F329 Major depressive disorder, single episode, unspecified: Secondary | ICD-10-CM | POA: Diagnosis not present

## 2016-04-28 DIAGNOSIS — Z9689 Presence of other specified functional implants: Secondary | ICD-10-CM | POA: Diagnosis not present

## 2016-04-28 DIAGNOSIS — M5137 Other intervertebral disc degeneration, lumbosacral region: Secondary | ICD-10-CM | POA: Diagnosis not present

## 2016-04-28 DIAGNOSIS — Z79891 Long term (current) use of opiate analgesic: Secondary | ICD-10-CM | POA: Diagnosis not present

## 2016-05-04 DIAGNOSIS — M950 Acquired deformity of nose: Secondary | ICD-10-CM | POA: Diagnosis not present

## 2016-05-04 DIAGNOSIS — J019 Acute sinusitis, unspecified: Secondary | ICD-10-CM | POA: Diagnosis not present

## 2016-05-20 ENCOUNTER — Other Ambulatory Visit: Payer: Self-pay | Admitting: Family Medicine

## 2016-05-20 NOTE — Telephone Encounter (Signed)
PCP- last seen 04/20/16 for hand swelling. Please advise if patient needs apt.

## 2016-05-23 MED ORDER — SERTRALINE HCL 100 MG PO TABS
100.0000 mg | ORAL_TABLET | Freq: Every day | ORAL | 0 refills | Status: DC
Start: 2016-05-23 — End: 2016-06-24

## 2016-05-23 NOTE — Telephone Encounter (Signed)
Pt aware and will make appt °

## 2016-05-23 NOTE — Telephone Encounter (Signed)
x

## 2016-05-23 NOTE — Telephone Encounter (Signed)
Go ahead and give him 1 month's refill of Zoloft 100 mg daily and have him come in to see me and we'll discuss it further.

## 2016-06-15 IMAGING — MR MR CERVICAL SPINE W/O CM
4 of 5 series · 21 of 48 positions shown · non-contrast
Comparison: Radiographs 03/20/2014.  CT 10/01/2012.

CLINICAL DATA: Posterior left-sided neck pain extending into the
left arm for 6 months. Occasional right arm pain. History of
cervical fusion 1 year ago.

EXAM:
MRI CERVICAL SPINE WITHOUT CONTRAST
TECHNIQUE: Multiplanar, multisequence MR imaging of the cervical spine was
performed. No intravenous contrast was administered.

[Series 3: T2 · sagittal · 3.0mm · 0.41mm/px · 8 of 17 slices shown (1 of 3)]
[im 1/17]
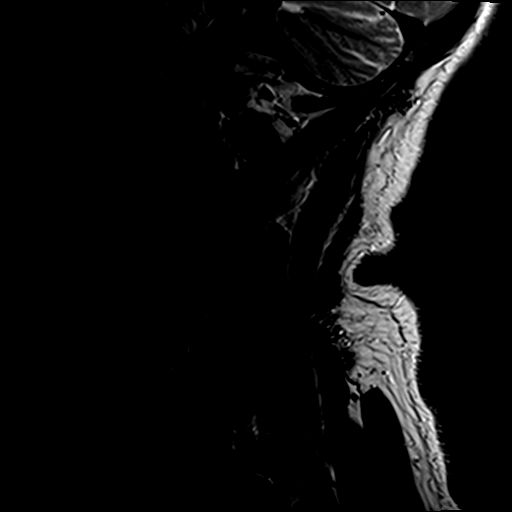
[im 3/17]
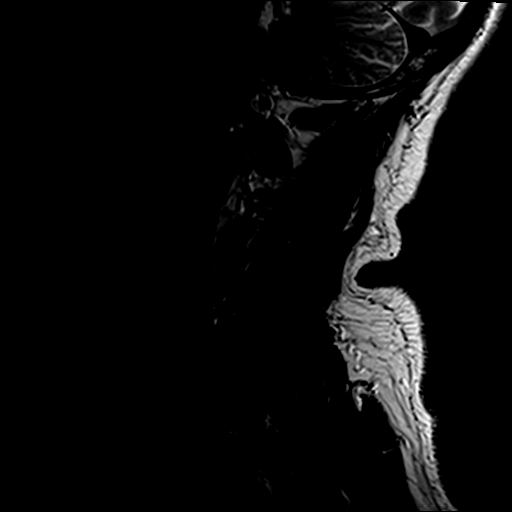
[im 5/17]
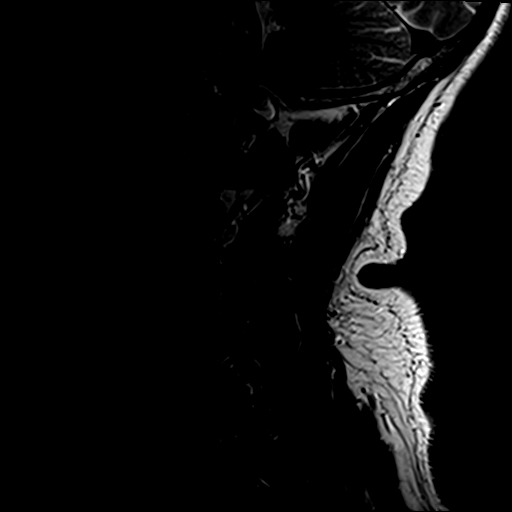
[im 7/17]
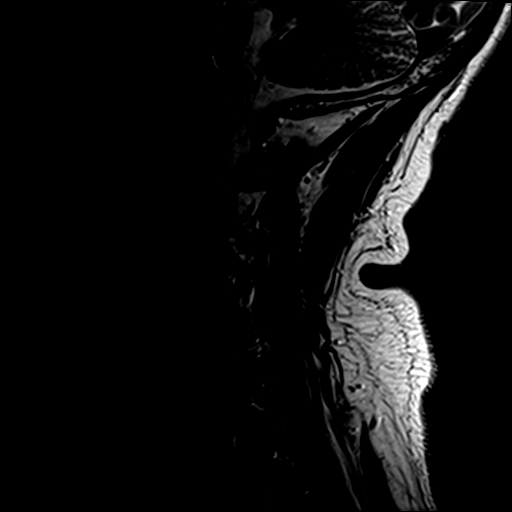
[im 10/17]
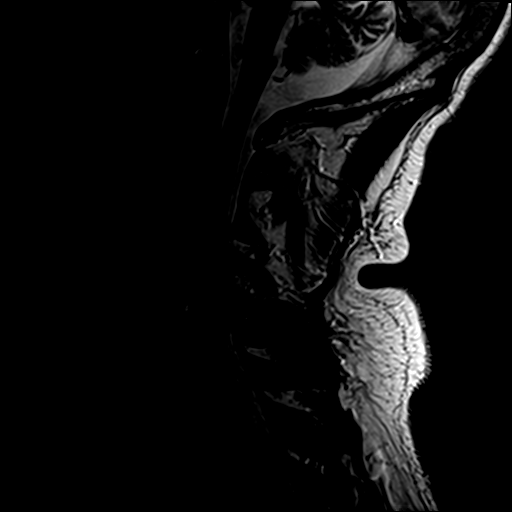
[im 12/17]
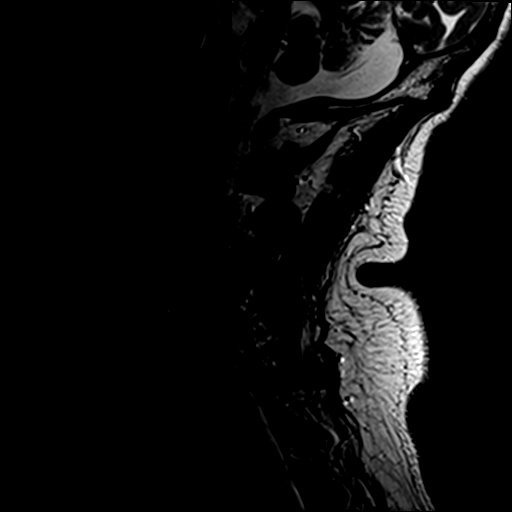
[im 14/17]
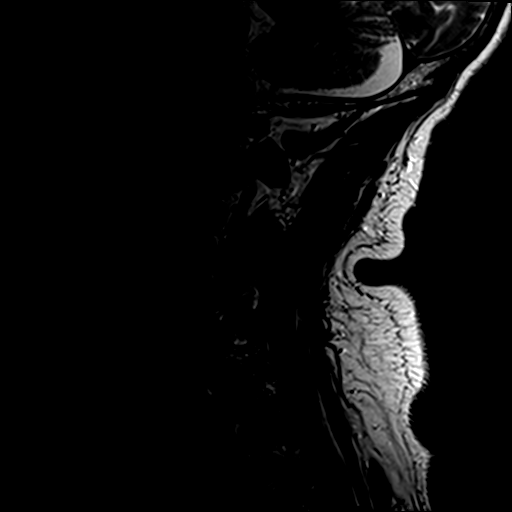
[im 17/17]
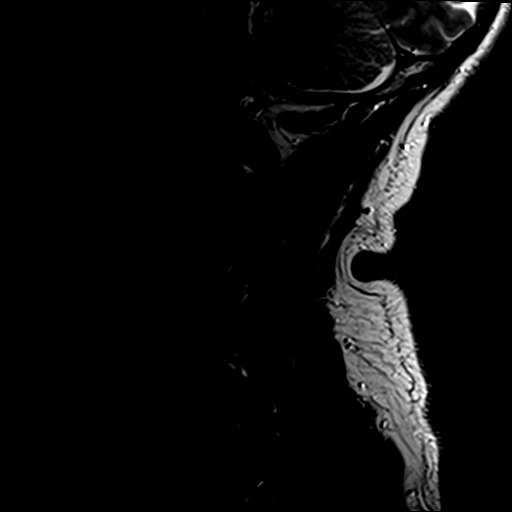

[Series 4: T1 · sagittal · 3.0mm · 0.41mm/px · 3 of 17 slices shown]
[im 3/17]
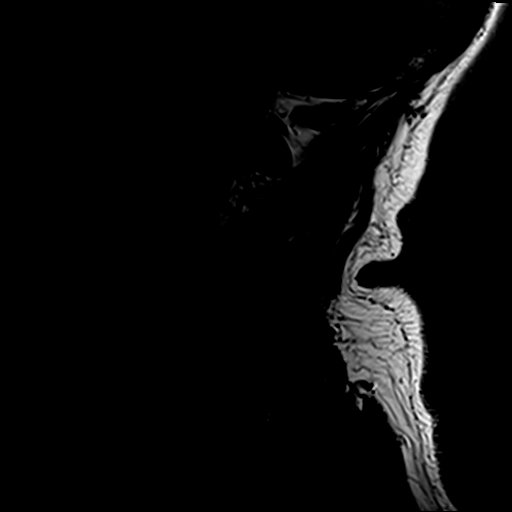
[im 9/17]
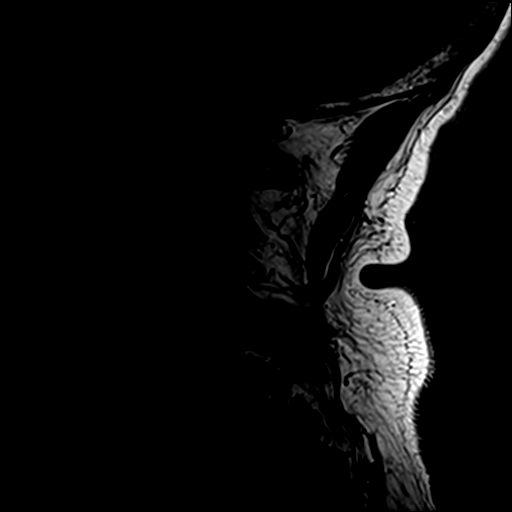
[im 14/17]
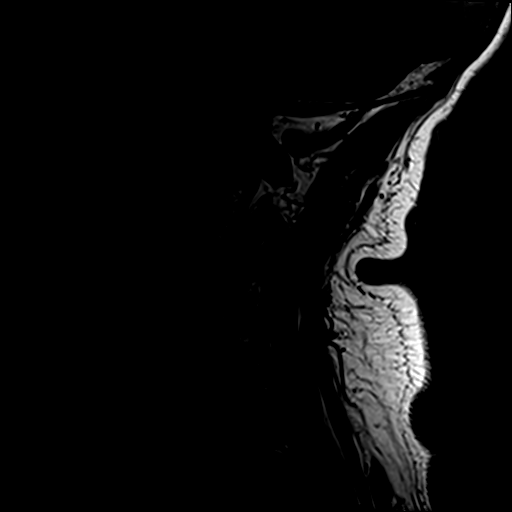

[Series 6: T2 · axial · 3.0mm · 0.39mm/px · z∈[-92,+0]mm · 7 of 32 slices shown (2 of 3)]
[im 1/32]
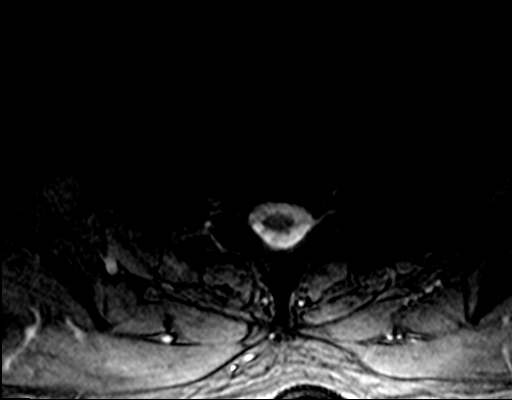
[im 6/32]
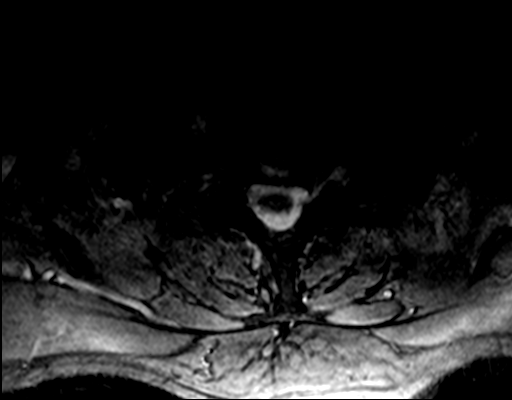
[im 11/32]
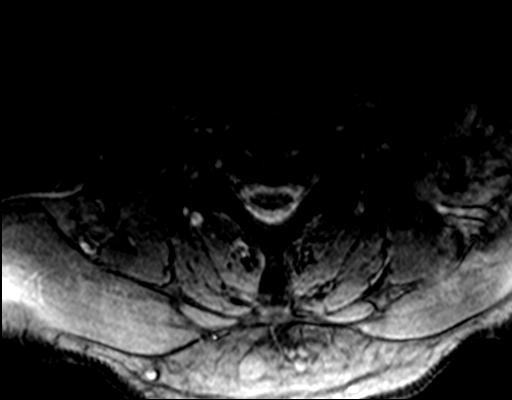
[im 13/32]
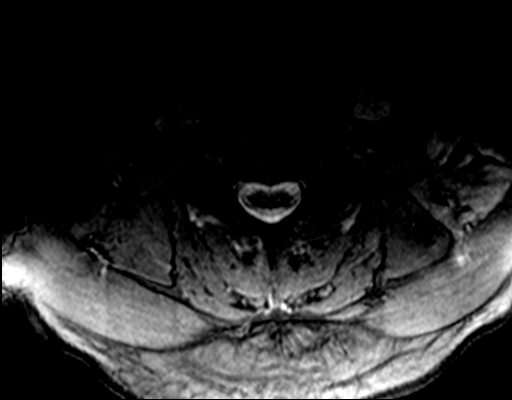
[im 16/32]
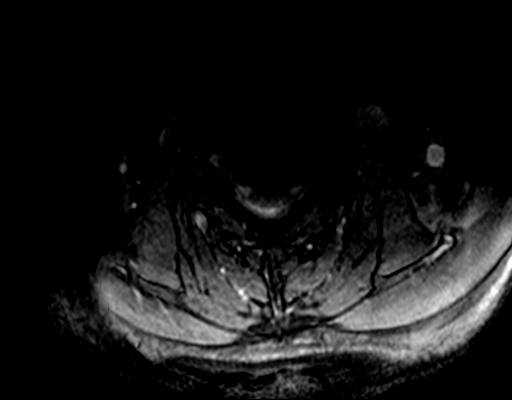
[im 19/32]
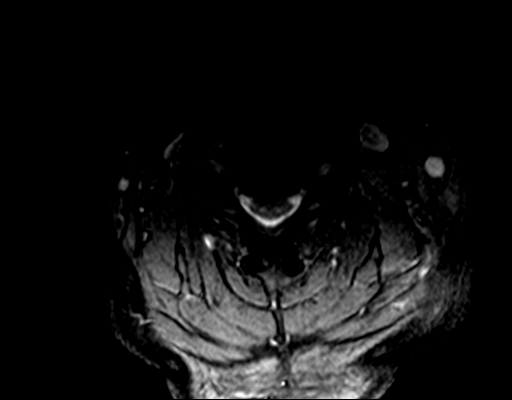
[im 26/32]
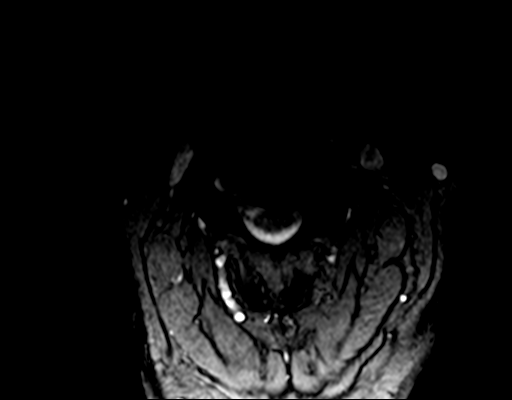

[Series 7: T2 · axial · 3.0mm · 0.39mm/px · z∈[-74,-0]mm · 3 of 32 slices shown (3 of 3)]
[im 6/32]
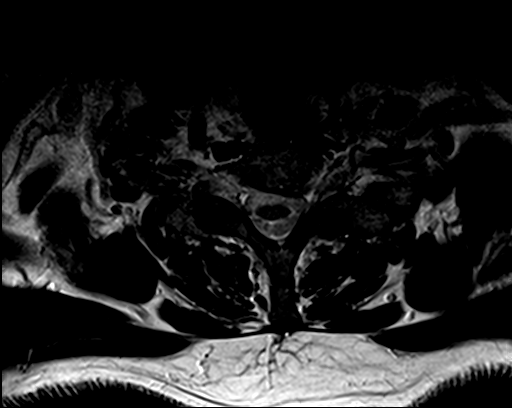
[im 16/32]
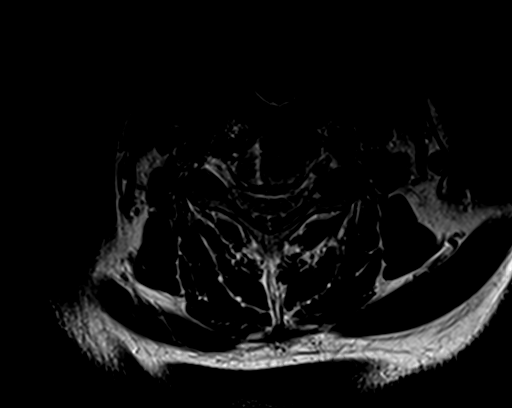
[im 26/32]
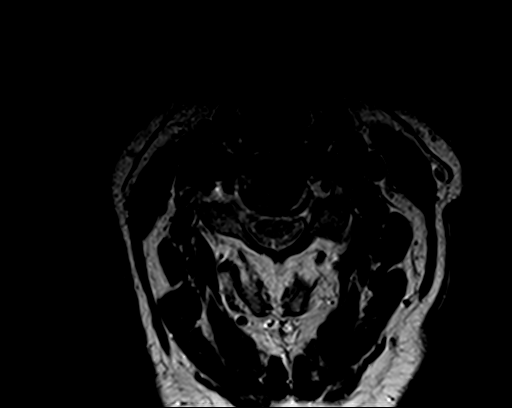

[21 of 48 positions shown; findings below may reference images not displayed]

FINDINGS: Alignment: Normal.

Bones: Status post C3-5 ACDF. The hardware appears well positioned.
No acute osseous findings are apparent.

Cord: Normal in signal and caliber.

Posterior Fossa: Visualized portions unremarkable.

Vertebral Arteries: Bilateral vertebral artery flow voids.

Paraspinal tissues: Unremarkable.

Disc levels:

There is some motion artifact on the axial images, especially the
gradient echo images (series 6).

C2-3:  No significant findings.

C3-4: Status post ACDF. Previously demonstrated uncinate spurring
appears improved. There is no cord deformity or foraminal
compromise.

C4-5: Status post ACDF. There is some residual foraminal narrowing
bilaterally due to uncinate spurring. No cord deformity.

C5-6: Stable mild disc bulging. No cord deformity or significant
foraminal compromise.

C6-7: Shallow central disc protrusion without cord deformity or
foraminal compromise.

C7-T1: Bilateral facet hypertrophy, worse on the left with
asymmetric uncinate spurring on the left. The left foramen appears
only mildly narrowed without definite C8 nerve root encroachment.
There is no cord deformity.
IMPRESSION: 1. Interval C3-5 ACDF without demonstrated complication. There is
mild residual foraminal narrowing bilaterally at C4-5.
2. Shallow central disc protrusion at C6-7 without cord deformity or
foraminal compromise.
3. Left-greater-than-right facet hypertrophy and uncinate spurring
C7-T1 with mild resulting left foraminal narrowing. No cord
deformity.

## 2016-06-24 ENCOUNTER — Encounter: Payer: Self-pay | Admitting: Family Medicine

## 2016-06-24 ENCOUNTER — Ambulatory Visit (INDEPENDENT_AMBULATORY_CARE_PROVIDER_SITE_OTHER): Payer: Medicare Other | Admitting: Family Medicine

## 2016-06-24 VITALS — BP 133/86 | HR 66 | Temp 98.1°F | Ht 73.0 in | Wt 249.0 lb

## 2016-06-24 DIAGNOSIS — I10 Essential (primary) hypertension: Secondary | ICD-10-CM | POA: Diagnosis not present

## 2016-06-24 MED ORDER — SERTRALINE HCL 100 MG PO TABS: 100.0000 mg | ORAL_TABLET | Freq: Every day | ORAL | 2 refills | Status: DC

## 2016-06-24 MED ORDER — HYDROCHLOROTHIAZIDE 25 MG PO TABS: 25.0000 mg | ORAL_TABLET | Freq: Every day | ORAL | 2 refills | Status: DC

## 2016-06-24 MED ORDER — OMEPRAZOLE 20 MG PO CPDR: DELAYED_RELEASE_CAPSULE | ORAL | 6 refills | Status: DC

## 2016-06-24 MED ORDER — LOSARTAN POTASSIUM 50 MG PO TABS: 50.0000 mg | ORAL_TABLET | Freq: Two times a day (BID) | ORAL | 3 refills | Status: DC

## 2016-06-24 NOTE — Progress Notes (Signed)
BP 133/86   Pulse 66   Temp 98.1 F (36.7 C) (Oral)   Ht 6\' 1"  (1.854 m)   Wt 249 lb (112.9 kg)   BMI 32.85 kg/m    Subjective:    Patient ID: Jeff Stewart, male    DOB: [birth year], 61 y.o.   MRN: [id number]  HPI: Jeff Stewart is a 61 y.o. male presenting on 06/24/2016 for Hypertension (followup) and Medication Refill   HPI Hypertension recheck Patient is coming in for a blood pressure recheck today. His blood pressure is 133/86. He is currently on hydrochlorothiazide and losartan. Patient denies headaches, blurred vision, chest pains, shortness of breath, or weakness. Denies any side effects from medication and is content with current medication.   Relevant past medical, surgical, family and social history reviewed and updated as indicated. Interim medical history since our last visit reviewed. Allergies and medications reviewed and updated.  Review of Systems  Constitutional: Negative for chills and fever.  Respiratory: Negative for shortness of breath and wheezing.   Cardiovascular: Negative for chest pain and leg swelling.  Musculoskeletal: Negative for back pain and gait problem.  Skin: Negative for rash.  Neurological: Negative for dizziness, weakness, light-headedness and headaches.  All other systems reviewed and are negative.   Per HPI unless specifically indicated above   Allergies as of 06/24/2016      Reactions   Nubain [nalbuphine Hcl] Rash      Medication List       Accurate as of 06/24/16 10:33 AM. Always use your most recent med list.          fluticasone 50 MCG/ACT nasal spray Commonly known as:  FLONASE Place 1 spray into both nostrils 2 (two) times daily as needed for allergies or rhinitis.   hydrochlorothiazide 25 MG tablet Commonly known as:  HYDRODIURIL Take 1 tablet (25 mg total) by mouth daily.   hydrocortisone 25 MG suppository Commonly known as:  ANUSOL-HC Place 1 suppository (25 mg total) rectally 4 (four) times daily as  needed for hemorrhoids.   losartan 50 MG tablet Commonly known as:  COZAAR Take 1 tablet (50 mg total) by mouth 2 (two) times daily.   morphine 30 MG tablet Commonly known as:  MSIR Take 30 mg by mouth at bedtime as needed.   morphine 20 MG/ML concentrated solution Commonly known as:  ROXANOL Take by mouth. pump   omeprazole 20 MG capsule Commonly known as:  PRILOSEC TAKE  (1)  CAPSULE  TWICE DAILY (TAKE ON AN EMPTY STOMACH AT LEAST 30MIN- UTES BEFORE MEALS).   sertraline 100 MG tablet Commonly known as:  ZOLOFT Take 1 tablet (100 mg total) by mouth daily.   SYRINGE 3CC/21GX1-1/4" 21G X 1-1/4" 3 ML Misc 1 each by Does not apply route once a week.   testosterone cypionate 200 MG/ML injection Commonly known as:  DEPOTESTOSTERONE CYPIONATE Inject 0.5 mLs (100 mg total) into the muscle once a week.          Objective:    BP 133/86   Pulse 66   Temp 98.1 F (36.7 C) (Oral)   Ht 6\' 1"  (1.854 m)   Wt 249 lb (112.9 kg)   BMI 32.85 kg/m   Wt Readings from Last 3 Encounters:  06/24/16 249 lb (112.9 kg)  04/20/16 251 lb 6.4 oz (114 kg)  04/18/16 252 lb (114.3 kg)    Physical Exam  Constitutional: He is oriented to person, place, and time. He appears well-developed and well-nourished.  No distress.  Eyes: Conjunctivae are normal. No scleral icterus.  Neck: Neck supple. No thyromegaly present.  Cardiovascular: Normal rate, regular rhythm, normal heart sounds and intact distal pulses.   No murmur heard. Pulmonary/Chest: Effort normal and breath sounds normal. No respiratory distress. He has no wheezes. He has no rales.  Musculoskeletal: Normal range of motion. He exhibits no edema.  Lymphadenopathy:    He has no cervical adenopathy.  Neurological: He is alert and oriented to person, place, and time. Coordination normal.  Skin: Skin is warm and dry. No rash noted. He is not diaphoretic.  Psychiatric: He has a normal mood and affect. His behavior is normal.  Nursing note  and vitals reviewed.     Assessment & Plan:   Problem List Items Addressed This Visit      Cardiovascular and Mediastinum   Essential hypertension, benign - Primary   Relevant Medications   losartan (COZAAR) 50 MG tablet   hydrochlorothiazide (HYDRODIURIL) 25 MG tablet       Follow up plan: Return in about 3 months (around 09/23/2016), or if symptoms worsen or fail to improve, for Hypertension recheck.  Counseling provided for all of the vaccine components No orders of the defined types were placed in this encounter.   Caryl Pina, MD [facility] 06/24/2016, 10:33 AM

## 2016-07-19 ENCOUNTER — Ambulatory Visit: Payer: Medicare Other | Admitting: "Endocrinology

## 2016-07-20 ENCOUNTER — Other Ambulatory Visit: Payer: Self-pay | Admitting: "Endocrinology

## 2016-07-20 DIAGNOSIS — E291 Testicular hypofunction: Secondary | ICD-10-CM | POA: Diagnosis not present

## 2016-07-20 LAB — CBC WITH DIFFERENTIAL/PLATELET
Basophils Absolute: 44 cells/uL (ref 0–200)
Basophils Relative: 1 %
EOS PCT: 2 %
Eosinophils Absolute: 88 cells/uL (ref 15–500)
HEMATOCRIT: 45.2 % (ref 38.5–50.0)
Hemoglobin: 14.6 g/dL (ref 13.2–17.1)
LYMPHS PCT: 30 %
Lymphs Abs: 1320 cells/uL (ref 850–3900)
MCH: 28.2 pg (ref 27.0–33.0)
MCHC: 32.3 g/dL (ref 32.0–36.0)
MCV: 87.4 fL (ref 80.0–100.0)
MPV: 10.6 fL (ref 7.5–12.5)
Monocytes Absolute: 484 cells/uL (ref 200–950)
Monocytes Relative: 11 %
NEUTROS ABS: 2464 {cells}/uL (ref 1500–7800)
NEUTROS PCT: 56 %
Platelets: 162 10*3/uL (ref 140–400)
RBC: 5.17 MIL/uL (ref 4.20–5.80)
RDW: 14.1 % (ref 11.0–15.0)
WBC: 4.4 10*3/uL (ref 3.8–10.8)

## 2016-07-21 DIAGNOSIS — M5137 Other intervertebral disc degeneration, lumbosacral region: Secondary | ICD-10-CM | POA: Diagnosis not present

## 2016-07-21 DIAGNOSIS — Z9689 Presence of other specified functional implants: Secondary | ICD-10-CM | POA: Diagnosis not present

## 2016-07-21 DIAGNOSIS — G8929 Other chronic pain: Secondary | ICD-10-CM | POA: Diagnosis not present

## 2016-07-21 DIAGNOSIS — Z79891 Long term (current) use of opiate analgesic: Secondary | ICD-10-CM | POA: Diagnosis not present

## 2016-07-21 LAB — TESTOSTERONE, FREE AND TOTAL (INCLUDES SHBG)-(MALES)
SEX HORMONE BINDING: 24 nmol/L (ref 22–77)
TESTOSTERONE FREE: 126.8 pg/mL (ref 47.0–244.0)
Testosterone-% Free: 2.5 % (ref 1.6–2.9)
Testosterone: 509 ng/dL (ref 250–827)

## 2016-08-01 ENCOUNTER — Ambulatory Visit (INDEPENDENT_AMBULATORY_CARE_PROVIDER_SITE_OTHER): Payer: Medicare Other | Admitting: "Endocrinology

## 2016-08-01 ENCOUNTER — Other Ambulatory Visit: Payer: Self-pay | Admitting: "Endocrinology

## 2016-08-01 ENCOUNTER — Encounter: Payer: Self-pay | Admitting: "Endocrinology

## 2016-08-01 VITALS — BP 137/79 | HR 73 | Ht 73.0 in | Wt 249.0 lb

## 2016-08-01 DIAGNOSIS — E291 Testicular hypofunction: Secondary | ICD-10-CM

## 2016-08-01 MED ORDER — TESTOSTERONE CYPIONATE 200 MG/ML IM SOLN: 100.0000 mg | INTRAMUSCULAR | 0 refills | Status: DC

## 2016-08-01 NOTE — Progress Notes (Signed)
Subjective:    Patient ID: Jeff Stewart, male    DOB: [birth year], PCP Dettinger, Fransisca Kaufmann, MD   Past Medical History:  Diagnosis Date  . Anxiety   . Carpal tunnel syndrome, bilateral   . Cataract   . Chronic back pain    internal morphine pump  . DDD (degenerative disc disease)    neck, lumbar  . Depression   . Dyslipidemia    diet controlled  . Hypertension    borderline  . Sleep apnea    wears C-PAP   Past Surgical History:  Procedure Laterality Date  . BACK SURGERY     x 6  . CARPAL TUNNEL RELEASE     bilateral  . COLONOSCOPY    . ELBOW SURGERY    . INGUINAL HERNIA REPAIR Right   . KNEE ARTHROSCOPY    . NASAL SEPTUM SURGERY     x 6  . neck fusion     . NISSEN FUNDOPLICATION    . STOMACH SURGERY    . UPPER GASTROINTESTINAL ENDOSCOPY     Social History   Social History  . Marital status: Married    Spouse name: N/A  . Number of children: 3  . Years of education: N/A   Occupational History  . [employer]     Retired   Social History Main Topics  . Smoking status: Never Smoker  . Smokeless tobacco: Never Used  . Alcohol use No  . Drug use: No  . Sexual activity: Yes    Birth control/ protection: Post-menopausal     Comment: married for 1972   Other Topics Concern  . None   Social History Narrative   Lives with wife    Outpatient Encounter Prescriptions as of 08/01/2016  Medication Sig  . fluticasone (FLONASE) 50 MCG/ACT nasal spray Place 1 spray into both nostrils 2 (two) times daily as needed for allergies or rhinitis.  . hydrochlorothiazide (HYDRODIURIL) 25 MG tablet Take 1 tablet (25 mg total) by mouth daily.  . hydrocortisone (ANUSOL-HC) 25 MG suppository Place 1 suppository (25 mg total) rectally 4 (four) times daily as needed for hemorrhoids.  Marland Kitchen losartan (COZAAR) 50 MG tablet Take 1 tablet (50 mg total) by mouth 2 (two) times daily.  Marland Kitchen morphine (MSIR) 30 MG tablet Take 30 mg by mouth at bedtime as needed.  Marland Kitchen morphine (ROXANOL)  20 MG/ML concentrated solution Take by mouth. pump  . omeprazole (PRILOSEC) 20 MG capsule TAKE  (1)  CAPSULE  TWICE DAILY (TAKE ON AN EMPTY STOMACH AT LEAST 30MIN- UTES BEFORE MEALS).  Marland Kitchen sertraline (ZOLOFT) 100 MG tablet Take 1 tablet (100 mg total) by mouth daily.  . Syringe/Needle, Disp, (SYRINGE 3CC/21GX1-1/4") 21G X 1-1/4" 3 ML MISC 1 each by Does not apply route once a week.  . testosterone cypionate (DEPOTESTOSTERONE CYPIONATE) 200 MG/ML injection Inject 0.5 mLs (100 mg total) into the muscle once a week.   Facility-Administered Encounter Medications as of 08/01/2016  Medication  . 0.9 %  sodium chloride infusion   ALLERGIES: Allergies  Allergen Reactions  . Nubain [Nalbuphine Hcl] Rash   VACCINATION STATUS: Immunization History  Administered Date(s) Administered  . Influenza,inj,Quad PF,36+ Mos 01/14/2015, 02/02/2016    HPI Jeff Stewart is 61 year old gentleman with above medical history. He is here to follow-up with labs for hypogonadism. - He is on testosterone 100 mg IM weekly. He feels much better. He is here to follow-up with repeat labs showing Continued improvement in his testosterone  to 509.  He was known to have low testosterone for at least since age 61.   He fathers 3 grown children. He denies history of head injury , has had nasal septal surgery multiple times. He denies injury to the testicles, exposure to chemotherapy, exposure to radiation to the genitals. However, due to chronic back injury and surgery he has exposure to heavy opioid therapy for pain control. He is currently on morphine pump. Overall on pain control for the last 10-15 years .   Review of Systems Constitutional: He has lost 10 lbs since November 2017. no subjective hyperthermia/hypothermia Eyes: no blurry vision, no xerophthalmia ENT: no sore throat, no nodules palpated in throat, no dysphagia/odynophagia, no hoarseness Cardiovascular: no CP/SOB/palpitations/leg swelling Respiratory: no  cough/SOB Gastrointestinal: no N/V/D/C Musculoskeletal: no muscle/joint aches Skin: no rashes,  Genitourinary: Reports improved libido  Neurological: no tremors/numbness/tingling/dizziness Psychiatric: no depression/anxiety  Objective:    BP 137/79   Pulse 73   Ht '6\' 1"'$  (1.854 m)   Wt 249 lb (112.9 kg)   BMI 32.85 kg/m   Wt Readings from Last 3 Encounters:  08/01/16 249 lb (112.9 kg)  06/24/16 249 lb (112.9 kg)  04/20/16 251 lb 6.4 oz (114 kg)    Physical Exam   Constitutional: overweight, in NAD Eyes: PERRLA, EOMI, no exophthalmos ENT: moist mucous membranes, no thyromegaly, no cervical lymphadenopathy Cardiovascular: RRR, No MRG Respiratory: CTA B Gastrointestinal: abdomen soft, NT, ND, BS+, rectal exam deferred (patient stated he had rectal exam last year by his primary medical doctor, reportedly no prostate enlargement) Genitourinary system: He has bilaterally shrunk testicles, right testes 12 mL, left testes 15 mL. No scrotal mass, no varicocele,  no inguinal lymphadenopathy. Musculoskeletal: Large extremities patient explains for medial, no deformities, strength intact in all 4 Skin: moist, warm, no rashes Neurological: no tremor with outstretched hands, DTR normal in all 4   Recent Results (from the past 2160 hour(s))  TESTOSTERONE, FREE AND TOTAL (INCLUDES SHBG)-(MALES)     Status: None   Collection Time: 07/20/16  9:07 AM  Result Value Ref Range   Testosterone 509 250 - 827 ng/dL   Sex Hormone Binding 24 22 - 77 nmol/L   Testosterone, Free 126.8 47.0 - 244.0 pg/mL   Testosterone-% Free 2.5 1.6 - 2.9 %  CBC with Differential/Platelet     Status: None   Collection Time: 07/20/16  9:07 AM  Result Value Ref Range   WBC 4.4 3.8 - 10.8 K/uL   RBC 5.17 4.20 - 5.80 MIL/uL   Hemoglobin 14.6 13.2 - 17.1 g/dL   HCT 45.2 38.5 - 50.0 %   MCV 87.4 80.0 - 100.0 fL   MCH 28.2 27.0 - 33.0 pg   MCHC 32.3 32.0 - 36.0 g/dL   RDW 14.1 11.0 - 15.0 %   Platelets 162 140 - 400  K/uL   MPV 10.6 7.5 - 12.5 fL   Neutro Abs 2,464 1,500 - 7,800 cells/uL   Lymphs Abs 1,320 850 - 3,900 cells/uL   Monocytes Absolute 484 200 - 950 cells/uL   Eosinophils Absolute 88 15 - 500 cells/uL   Basophils Absolute 44 0 - 200 cells/uL   Neutrophils Relative % 56 %   Lymphocytes Relative 30 %   Monocytes Relative 11 %   Eosinophils Relative 2 %   Basophils Relative 1 %   Smear Review Criteria for review not met      Assessment & Plan:   1. Hypogonadism, male  -Testosterone replacement helped improve  his testosterone from 74 ->286 -> 782 ->509  No adverse events noted. 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  -It is possible that it is multifactorial including exposure to heavy opioids, multiple back surgeries, prior inguinal surgery, or idiopathic.  -Prolactin level is favorable at 6.1, he would not need pituitary/sella imaging for now.  -He will benefit from continued replacement of testosterone. -I advised him to continue 100 mg of testosterone cypionate IM weekly for target testosterone level of 400-600 mcg/dL.  -He will return in 4 months with lab work for CBC total testosterone, transaminases .  -He is advised to maintain his annual physical with his primary medical doctor and obtain PSA.    - I advised patient to maintain close follow up with Dettinger, Fransisca Kaufmann, MD for primary care needs. Follow up plan: Return in about 4 months (around 12/02/2016) for follow up with pre-visit labs.  Glade Lloyd, MD Phone: [phone]  Fax: [phone]   08/01/2016, 9:05 AM

## 2016-08-09 ENCOUNTER — Other Ambulatory Visit: Payer: Self-pay | Admitting: Family Medicine

## 2016-08-09 DIAGNOSIS — I10 Essential (primary) hypertension: Secondary | ICD-10-CM

## 2016-08-24 ENCOUNTER — Encounter: Payer: Self-pay | Admitting: Family Medicine

## 2016-08-24 ENCOUNTER — Ambulatory Visit (INDEPENDENT_AMBULATORY_CARE_PROVIDER_SITE_OTHER): Payer: Medicare Other | Admitting: Family Medicine

## 2016-08-24 VITALS — BP 130/80 | HR 80 | Temp 97.4°F

## 2016-08-24 DIAGNOSIS — S61212A Laceration without foreign body of right middle finger without damage to nail, initial encounter: Secondary | ICD-10-CM

## 2016-08-24 NOTE — Progress Notes (Signed)
BP 130/80 (BP Location: Left Arm, Patient Position: Sitting, Cuff Size: Large)   Pulse 80   Temp 97.4 F (36.3 C) (Oral)    Subjective:    Patient ID: Jeff Stewart, male    DOB: 18-Mar-1956, 61 y.o.   MRN: [id number]  HPI: Jeff Stewart is a 61 y.o. male presenting on 08/24/2016 for Laceration (Right 3rd finger)   HPI Finger laceration Patient comes in today because he sustained earlier today when working with his pressure washer, he was trying to push to handle down but his handle had been previously broken and it jerked back and it cut his finger. He tried to put some bandages over it but it did not seem to want to get healed up and so is coming in today to get a repaired. He denies any significant pain from it and rates it as a 3 out of 10. He cut himself on the palm side of his middle finger on the right hand near the PIP joint. He denies any fevers or chills or redness or warmth.  Relevant past medical, surgical, family and social history reviewed and updated as indicated. Interim medical history since our last visit reviewed. Allergies and medications reviewed and updated.  Review of Systems  Constitutional: Negative for chills and fever.  Respiratory: Negative for shortness of breath and wheezing.   Cardiovascular: Negative for chest pain and leg swelling.  Musculoskeletal: Negative for arthralgias, back pain and gait problem.  Skin: Positive for wound. Negative for color change and rash.  Neurological: Negative for dizziness.  All other systems reviewed and are negative.   Per HPI unless specifically indicated above        Objective:    BP 130/80 (BP Location: Left Arm, Patient Position: Sitting, Cuff Size: Large)   Pulse 80   Temp 97.4 F (36.3 C) (Oral)   Wt Readings from Last 3 Encounters:  08/01/16 249 lb (112.9 kg)  06/24/16 249 lb (112.9 kg)  04/20/16 251 lb 6.4 oz (114 kg)    Physical Exam  Constitutional: He is oriented to person, place, and time.  He appears well-developed and well-nourished. No distress.  Neurological: He is alert and oriented to person, place, and time. Coordination normal.  Skin: Skin is warm and dry. Laceration (1.5 cm laceration on right middle finger overlying PIP joint on the palmar side) noted. No rash noted. He is not diaphoretic.  Psychiatric: He has a normal mood and affect. His behavior is normal.  Nursing note and vitals reviewed.   Laceration repair: Wound was irrigated with normal saline at pressure. 2% lidocaine with epinephrine was used for local anesthesia, For mL. 3-0 Monocryl was used to repair the wound.  Placed for sutures. Wound was approximated well and topical antibiotic was used and then it was covered by 4 x 4 and tape told in place. Procedure was tolerated well     Assessment & Plan:   Problem List Items Addressed This Visit    None    Visit Diagnoses    Laceration of right middle finger without foreign body without damage to nail, initial encounter    -  Primary    Instructed patient to keep area dry and clean, keep covered for first 24 hours and then maybe open-air as well as is not dirty after that.   Follow up plan: Return in about 9 days (around 09/02/2016), or if symptoms worsen or fail to improve.  Counseling provided for all of the vaccine  components No orders of the defined types were placed in this encounter.   Caryl Pina, MD [facility] 08/24/2016, 10:41 AM

## 2016-08-29 DIAGNOSIS — J988 Other specified respiratory disorders: Secondary | ICD-10-CM | POA: Diagnosis not present

## 2016-08-29 DIAGNOSIS — J309 Allergic rhinitis, unspecified: Secondary | ICD-10-CM | POA: Diagnosis not present

## 2016-08-29 DIAGNOSIS — J329 Chronic sinusitis, unspecified: Secondary | ICD-10-CM | POA: Diagnosis not present

## 2016-08-29 DIAGNOSIS — J328 Other chronic sinusitis: Secondary | ICD-10-CM | POA: Diagnosis not present

## 2016-09-05 ENCOUNTER — Ambulatory Visit (INDEPENDENT_AMBULATORY_CARE_PROVIDER_SITE_OTHER): Payer: Medicare Other | Admitting: Family Medicine

## 2016-09-05 ENCOUNTER — Encounter: Payer: Self-pay | Admitting: Family Medicine

## 2016-09-05 VITALS — BP 132/88 | HR 75 | Temp 98.1°F | Ht 73.0 in | Wt 251.0 lb

## 2016-09-05 DIAGNOSIS — S61212D Laceration without foreign body of right middle finger without damage to nail, subsequent encounter: Secondary | ICD-10-CM | POA: Diagnosis not present

## 2016-09-05 NOTE — Progress Notes (Signed)
   BP 132/88   Pulse 75   Temp 98.1 F (36.7 C) (Oral)   Ht 6\' 1"  (1.854 m)   Wt 251 lb (113.9 kg)   BMI 33.12 kg/m    Subjective:    Patient ID: Jeff Stewart, male    DOB: 1955-04-29, 61 y.o.   MRN: [id number]  HPI: Jeff Stewart is a 61 y.o. male presenting on 09/05/2016 for Suture / Staple Removal   HPI Suture removal/finger laceration Patient returns for suture removal, he removed his own sutures 2 days ago because they were coming out anyways. He denies any redness or warmth or drainage. He denies any fevers or chills. He denies any numbness or weakness or loss of range of motion of the swelling has come down nicely.  Relevant past medical, surgical, family and social history reviewed and updated as indicated. Interim medical history since our last visit reviewed. Allergies and medications reviewed and updated.  Review of Systems  Constitutional: Negative for chills and fever.  Respiratory: Negative for shortness of breath and wheezing.   Cardiovascular: Negative for chest pain and leg swelling.  Skin: Positive for wound. Negative for rash.  All other systems reviewed and are negative.   Per HPI unless specifically indicated above     Objective:    BP 132/88   Pulse 75   Temp 98.1 F (36.7 C) (Oral)   Ht 6\' 1"  (1.854 m)   Wt 251 lb (113.9 kg)   BMI 33.12 kg/m   Wt Readings from Last 3 Encounters:  09/05/16 251 lb (113.9 kg)  08/01/16 249 lb (112.9 kg)  06/24/16 249 lb (112.9 kg)    Physical Exam  Constitutional: He appears well-developed and well-nourished. No distress.  Eyes: Conjunctivae are normal. No scleral icterus.  Musculoskeletal: Normal range of motion.  Neurological: He is alert.  Skin: Skin is warm and dry. Laceration (Well-healed, sutures are already out) noted. No rash noted. He is not diaphoretic.  Psychiatric: He has a normal mood and affect. His behavior is normal.  Nursing note and vitals reviewed.     Assessment & Plan:    Problem List Items Addressed This Visit    None    Visit Diagnoses    Laceration of right middle finger without foreign body without damage to nail, subsequent encounter    -  Primary      Appears well, no signs of infection.  Follow up as needed  Follow up plan: Return if symptoms worsen or fail to improve.  Counseling provided for all of the vaccine components No orders of the defined types were placed in this encounter.   Caryl Pina, MD [facility] 09/05/2016, 8:12 AM

## 2016-10-04 ENCOUNTER — Other Ambulatory Visit: Payer: Self-pay | Admitting: Family Medicine

## 2016-10-10 DIAGNOSIS — M961 Postlaminectomy syndrome, not elsewhere classified: Secondary | ICD-10-CM | POA: Diagnosis not present

## 2016-10-10 DIAGNOSIS — Z79899 Other long term (current) drug therapy: Secondary | ICD-10-CM | POA: Diagnosis not present

## 2016-10-10 DIAGNOSIS — M5030 Other cervical disc degeneration, unspecified cervical region: Secondary | ICD-10-CM | POA: Diagnosis not present

## 2016-10-10 DIAGNOSIS — M5136 Other intervertebral disc degeneration, lumbar region: Secondary | ICD-10-CM | POA: Diagnosis not present

## 2016-10-10 DIAGNOSIS — G894 Chronic pain syndrome: Secondary | ICD-10-CM | POA: Diagnosis not present

## 2016-10-10 DIAGNOSIS — Z5181 Encounter for therapeutic drug level monitoring: Secondary | ICD-10-CM | POA: Diagnosis not present

## 2016-11-16 DIAGNOSIS — M961 Postlaminectomy syndrome, not elsewhere classified: Secondary | ICD-10-CM | POA: Diagnosis not present

## 2016-11-16 DIAGNOSIS — M5136 Other intervertebral disc degeneration, lumbar region: Secondary | ICD-10-CM | POA: Diagnosis not present

## 2016-11-16 DIAGNOSIS — M25511 Pain in right shoulder: Secondary | ICD-10-CM | POA: Diagnosis not present

## 2016-11-16 DIAGNOSIS — G894 Chronic pain syndrome: Secondary | ICD-10-CM | POA: Diagnosis not present

## 2016-11-22 ENCOUNTER — Encounter: Payer: Self-pay | Admitting: Family Medicine

## 2016-11-22 ENCOUNTER — Ambulatory Visit (INDEPENDENT_AMBULATORY_CARE_PROVIDER_SITE_OTHER): Payer: Medicare Other | Admitting: Family Medicine

## 2016-11-22 VITALS — BP 134/80 | HR 63 | Temp 97.5°F | Ht 73.0 in | Wt 248.4 lb

## 2016-11-22 DIAGNOSIS — Z1159 Encounter for screening for other viral diseases: Secondary | ICD-10-CM | POA: Diagnosis not present

## 2016-11-22 DIAGNOSIS — Z0000 Encounter for general adult medical examination without abnormal findings: Secondary | ICD-10-CM | POA: Diagnosis not present

## 2016-11-22 DIAGNOSIS — Z114 Encounter for screening for human immunodeficiency virus [HIV]: Secondary | ICD-10-CM | POA: Diagnosis not present

## 2016-11-22 NOTE — Progress Notes (Signed)
Subjective:    Jeff Stewart is a 61 y.o. male who presents for Medicare Initial preventive examination.   Jeff Stewart is a pleasant 61 year old male, who lives at home with his wife Jeff Stewart. They've been married since '72 and have 3 grandchildren (the youngest are fraternal twins; girl/boy). Jeff Stewart is one of 12 children. He has 4 living siblings. 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 He struggles with chronic back pain/back surgeries, which resulted in him going on disability. He reports that his pain is well controlled with a morphine pump, which is managed by pain management. He has had no falls in the last year and feels he is agile.  He is able to stay fairly active, citing that he really enjoys staying outside the home when able. He walks regularly with his wife almost every day of the week, unless weather does not permit. He is actively involved with [employer] and his grandchildren who has a good relationship with. He also enjoys Psychologist, counselling. He has no pets of his iron, but he reports that he does take care of his children's pets when they go on vacation.  Preventive Screening-Counseling & Management  Tobacco History  Smoking Status  . Never Smoker  Smokeless Tobacco  . Never Used    Problems Prior to Visit 1. Chronic back pain. He sees a pain clinic for management of chronic pain medications/morphine pump. 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 Dorris Fetch with endocrinology for this. He is on testosterone for this. It is suspected that hypogonadism is a result of chronic pain medications. His goal is to wean from chronic pain medications. 3. Depression/anxiety. This is managed by primary care provider, Dr. Warrick Parisian. He reports this is well controlled on Zoloft.  Current Problems (verified) Patient Active Problem List   Diagnosis Date Noted  . Abdominal pain, epigastric 11/04/2015  . Nausea without vomiting 11/04/2015  . Rectal  discomfort 11/04/2015  . Hemorrhoid 08/04/2015  . Dyspnea 06/10/2015  . Hypogonadism, male 04/03/2015  . Essential hypertension, benign 03/04/2015  . Anxiety and depression 02/23/2015  . Chronic back pain 01/14/2015  . Atypical chest pain 06/14/2013    Medications Prior to Visit Current Outpatient Prescriptions on File Prior to Visit  Medication Sig Dispense Refill  . fluticasone (FLONASE) 50 MCG/ACT nasal spray Place 1 spray into both nostrils 2 (two) times daily as needed for allergies or rhinitis. 16 g 6  . hydrochlorothiazide (HYDRODIURIL) 25 MG tablet TAKE 1 TABLET DAILY 90 tablet 1  . hydrocortisone (ANUSOL-HC) 25 MG suppository Place 1 suppository (25 mg total) rectally 4 (four) times daily as needed for hemorrhoids. 12 suppository 2  . losartan (COZAAR) 50 MG tablet Take 1 tablet (50 mg total) by mouth 2 (two) times daily. 60 tablet 3  . morphine (MSIR) 30 MG tablet Take 30 mg by mouth at bedtime as needed.    Marland Kitchen morphine (ROXANOL) 20 MG/ML concentrated solution Take by mouth. pump    . omeprazole (PRILOSEC) 20 MG capsule TAKE  (1)  CAPSULE  TWICE DAILY (TAKE ON AN EMPTY STOMACH AT LEAST 30MIN- UTES BEFORE MEALS). 180 capsule 6  . sertraline (ZOLOFT) 100 MG tablet TAKE 1 TABLET DAILY 30 tablet 1  . Syringe/Needle, Disp, (SYRINGE 3CC/21GX1-1/4") 21G X 1-1/4" 3 ML MISC 1 each by Does not apply route once a week. 50 each 0  . testosterone cypionate (DEPOTESTOSTERONE CYPIONATE) 200 MG/ML injection Inject 0.5 mLs (100 mg total) into the  muscle once a week. 10 mL 0   Current Facility-Administered Medications on File Prior to Visit  Medication Dose Route Frequency Provider Last Rate Last Dose  . 0.9 %  sodium chloride infusion  500 mL Intravenous Continuous Ladene Artist, MD        Current Medications (verified) Current Outpatient Prescriptions  Medication Sig Dispense Refill  . fluticasone (FLONASE) 50 MCG/ACT nasal spray Place 1 spray into both nostrils 2 (two) times daily as  needed for allergies or rhinitis. 16 g 6  . hydrochlorothiazide (HYDRODIURIL) 25 MG tablet TAKE 1 TABLET DAILY 90 tablet 1  . hydrocortisone (ANUSOL-HC) 25 MG suppository Place 1 suppository (25 mg total) rectally 4 (four) times daily as needed for hemorrhoids. 12 suppository 2  . losartan (COZAAR) 50 MG tablet Take 1 tablet (50 mg total) by mouth 2 (two) times daily. 60 tablet 3  . morphine (MSIR) 30 MG tablet Take 30 mg by mouth at bedtime as needed.    Marland Kitchen morphine (ROXANOL) 20 MG/ML concentrated solution Take by mouth. pump    . omeprazole (PRILOSEC) 20 MG capsule TAKE  (1)  CAPSULE  TWICE DAILY (TAKE ON AN EMPTY STOMACH AT LEAST 30MIN- UTES BEFORE MEALS). 180 capsule 6  . sertraline (ZOLOFT) 100 MG tablet TAKE 1 TABLET DAILY 30 tablet 1  . Syringe/Needle, Disp, (SYRINGE 3CC/21GX1-1/4") 21G X 1-1/4" 3 ML MISC 1 each by Does not apply route once a week. 50 each 0  . testosterone cypionate (DEPOTESTOSTERONE CYPIONATE) 200 MG/ML injection Inject 0.5 mLs (100 mg total) into the muscle once a week. 10 mL 0   Current Facility-Administered Medications  Medication Dose Route Frequency Provider Last Rate Last Dose  . 0.9 %  sodium chloride infusion  500 mL Intravenous Continuous Ladene Artist, MD         Allergies (verified) Nubain [nalbuphine hcl]   PAST HISTORY  Family History Family History  Problem Relation Age of Onset  . CAD Father 22  . Emphysema Father   . Stroke Mother 77  . CAD Brother 61       CABG  . Alcohol abuse Brother   . Hypertension Brother   . Stroke Brother   . CAD Brother   . Cirrhosis Brother   . Alcohol abuse Brother   . Hypertension Brother   . CAD Sister   . Hypertension Sister   . Hyperlipidemia Sister   . Cancer Sister        melanoma  . Cancer Sister   . Cancer Brother   . Cancer Brother     Social History Social History  Substance Use Topics  . Smoking status: Never Smoker  . Smokeless tobacco: Never Used  . Alcohol use No    Are there  smokers in your home (other than you)?  No  Risk Factors Current exercise habits: Home exercise routine includes walking 0.5-1 hrs per day. Exercise is limited by heat (weather).  Dietary issues discussed: patient's diet is balanced   Cardiac risk factors: advanced age (older than 62 for men, 57 for women), hypertension and male gender.  Depression Screen (Note: if answer to either of the following is "Yes", a more complete depression screening is indicated)   Q1: Over the past two weeks, have you felt down, depressed or hopeless? Yes  Q2: Over the past two weeks, have you felt little interest or pleasure in doing things? No  Have you lost interest or pleasure in daily life? No  Do you  often feel hopeless? No  Do you cry easily over simple problems? No  Activities of Daily Living In your present state of health, do you have any difficulty performing the following activities?:  Driving? No Managing money?  No Feeding yourself? No Getting from bed to chair? No Climbing a flight of stairs? No Preparing food and eating?: No Bathing or showering? No Getting dressed: No Getting to the toilet? No Using the toilet:No Moving around from place to place: No In the past year have you fallen or had a near fall?:No   Are you sexually active?  No ; not currently 2/2 low testosterone  Do you have more than one partner?  No  Hearing Difficulties: No Do you often ask people to speak up or repeat themselves? No Do you experience ringing or noises in your ears? No Do you have difficulty understanding soft or whispered voices? No   Do you feel that you have a problem with memory? No  Do you often misplace items? No  Do you feel safe at home?  Yes  Cognitive Testing  Alert? Yes  Normal Appearance?Yes  Oriented to person? Yes  Place? Yes   Time? Yes  Recall of three objects?  Yes  Can perform simple calculations? Yes  Displays appropriate judgment?Yes  Can read the correct time from a  watch face?Yes   Advanced Directives have been discussed with the patient? Yes   List the Names of Other Physician/Practitioners you currently use: 1.  Dr Dorris Fetch, endocrinology (hypogonadism) 2.  Dr Darral Dash, pain management (chronic back pain)  Indicate any recent Medical Services you may have received from other than [facility] providers in the past year (date may be approximate).  Immunization History  Administered Date(s) Administered  . Influenza,inj,Quad PF,6+ Mos 01/14/2015, 02/02/2016  . Tdap 12/11/2014    Screening Tests Health Maintenance  Topic Date Due  . Hepatitis C Screening  June 03, 1955  . HIV Screening  05/04/1968  . INFLUENZA VACCINE  10/19/2016  . COLONOSCOPY  06/11/2018  . TETANUS/TDAP  12/10/2024    All answers were reviewed with the patient and necessary referrals were made:  Ronnie Doss, DO   11/22/2016   History reviewed: allergies, current medications, past family history, past medical history, past social history, past surgical history and problem list  Review of Systems Constitutional: negative Eyes: positive for contacts/glasses and reading glasses Ears, nose, mouth, throat, and face: negative Respiratory: negative Cardiovascular: negative Gastrointestinal: positive for occasional abdominal pain Genitourinary:negative Integument/breast: negative Hematologic/lymphatic: negative Musculoskeletal:positive for arthralgias and back pain Neurological: negative Behavioral/Psych: positive for anxiety and depression Endocrine: positive for hypogonadism Allergic/Immunologic: negative    Objective:     Blood pressure 134/80, pulse 63, temperature (!) 97.5 F (36.4 C), temperature source Oral, height 6\' 1"  (1.854 m), weight 248 lb 6 oz (112.7 kg). Body mass index is 32.77 kg/m.  BP 134/80   Pulse 63   Temp (!) 97.5 F (36.4 C) (Oral)   Ht 6\' 1"  (1.854 m)   Wt 248 lb 6 oz (112.7 kg)   BMI 32.77 kg/m  General appearance: alert, cooperative,  appears stated age and no distress Eyes: negative findings: lids and lashes normal, conjunctivae and sclerae normal and corneas clear Lungs: clear to auscultation bilaterally Heart: regular rate and rhythm, S1, S2 normal, no murmur, click, rub or gallop Pulses: 2+ and symmetric  Psych: mood stable, speech normal, good eye contact, pleasant     Depression screen [facility] 2/9 11/22/2016 09/05/2016 08/24/2016 08/01/2016 06/24/2016  Decreased  Interest 0 1 0 0 2  Down, Depressed, Hopeless 1 0 1 0 1  PHQ - 2 Score 1 1 1  0 3  Altered sleeping 3 - - - 2  Tired, decreased energy 3 - - - 2  Change in appetite 1 - - - 0  Feeling bad or failure about yourself  0 - - - 0  Trouble concentrating 1 - - - -  Moving slowly or fidgety/restless 0 - - - 1  Suicidal thoughts 0 - - - 0  PHQ-9 Score 9 - - - 8  Difficult doing work/chores - - - - Somewhat difficult    MMSE - Mini Mental State Exam 11/22/2016  Orientation to time 5  Orientation to Place 5  Registration 3  Attention/ Calculation 5  Recall 2  Language- name 2 objects 2  Language- repeat 1  Language- follow 3 step command 3  Language- read & follow direction 1  Write a sentence 1  Copy design 1  Total score 29   Assessment:     1. Medicare annual wellness visit, initial. Jeff Stewart is a fairly functional, healthy male with the exception of back pain and testosterone issues. He seems fairly well plugged in with specialists for chronic pain syndrome and hypogonadism. He sees Dr. Warrick Parisian for depression/anxiety. He did score a 9 on his phq- 9 today. I encouraged him to follow up with Dr. Warrick Parisian in the next week or 2 regarding this. He is up-to-date on colonoscopy, next colonoscopy will be 2020. Up to date on vaccines. I did discuss that he will likely need a pneumonia vaccine at age 23. Screening for HIV and hepatitis C obtained today. 2. Encounter for screening for HIV 3. Need for hepatitis C screening test      Plan:     During the course of  the visit the patient was educated and counseled about appropriate screening and preventive services including:    Advanced directives: has NO advanced directive  - add't info requested. Patient given the packet and he will discuss this with his wife. I encouraged him to bring a copy in to the office so that we may scan it into his file.  Healthy lifestyle choices were reviewed. It seems that he is getting adequate exercise. His BMI is elevated to 32. I encouraged well-balanced diet, emphasizing fruits and vegetables and lean meats. I encouraged him to avoid simple carbohydrates and unhealthy fats. Would consider referral to dietitian in the future if patient desires.  Patient Instructions (the written plan) was given to the patient.  Medicare Attestation I have personally reviewed: The patient's medical and social history Their use of alcohol, tobacco or illicit drugs Their current medications and supplements The patient's functional ability including ADLs,fall risks, home safety risks, cognitive, and hearing and visual impairment Diet and physical activities Evidence for depression or mood disorders  The patient's weight, height, BMI, and visual screening have been recorded in the chart.  I have made referrals, counseling, and provided education to the patient based on review of the above and I have provided the patient with a written personalized care plan for preventive services.     Ronnie Doss, DO   11/22/2016

## 2016-11-22 NOTE — Patient Instructions (Signed)
Remember to go over the advanced directive stuff with Jeff Stewart and bring in so we can scan.  Jeff Stewart , Thank you for taking time to come for your Medicare Wellness Visit. I appreciate your ongoing commitment to your health goals. Please review the following plan we discussed and let me know if I can assist you in the future.   These are the goals we discussed: Goals    None      This is a list of the screening recommended for you and due dates:  Health Maintenance  Topic Date Due  .  Hepatitis C: One time screening is recommended by Center for Disease Control  (CDC) for  adults born from [birth year] through 1967.   Dec 21, 1955  . HIV Screening  05/04/1968  . Flu Shot  10/19/2016  . Colon Cancer Screening  06/11/2018  . Tetanus Vaccine  12/10/2024

## 2016-11-23 LAB — HEPATITIS C ANTIBODY: Hep C Virus Ab: <0.1 s/co ratio (ref 0.0–0.9)

## 2016-11-23 LAB — HIV ANTIBODY (ROUTINE TESTING W REFLEX): HIV Screen 4th Generation wRfx: NONREACTIVE

## 2016-11-24 DIAGNOSIS — E291 Testicular hypofunction: Secondary | ICD-10-CM | POA: Diagnosis not present

## 2016-11-24 LAB — HEPATIC FUNCTION PANEL
AG Ratio: 1.8 (calc) (ref 1.0–2.5)
ALBUMIN MSPROF: 4.3 g/dL (ref 3.6–5.1)
ALT: 15 U/L (ref 9–46)
AST: 17 U/L (ref 10–35)
Alkaline phosphatase (APISO): 74 U/L (ref 40–115)
Bilirubin, Direct: 0.1 mg/dL (ref 0.0–0.2)
GLOBULIN: 2.4 g/dL (ref 1.9–3.7)
Indirect Bilirubin: 0.4 mg/dL (calc) (ref 0.2–1.2)
TOTAL PROTEIN: 6.7 g/dL (ref 6.1–8.1)
Total Bilirubin: 0.5 mg/dL (ref 0.2–1.2)

## 2016-11-24 LAB — CBC WITH DIFFERENTIAL/PLATELET
BASOS PCT: 0.4 %
Basophils Absolute: 18 cells/uL (ref 0–200)
Eosinophils Absolute: 87 cells/uL (ref 15–500)
Eosinophils Relative: 1.9 %
HCT: 44.2 % (ref 38.5–50.0)
HEMOGLOBIN: 14.4 g/dL (ref 13.2–17.1)
LYMPHS ABS: 1398 {cells}/uL (ref 850–3900)
MCH: 28 pg (ref 27.0–33.0)
MCHC: 32.6 g/dL (ref 32.0–36.0)
MCV: 85.8 fL (ref 80.0–100.0)
MPV: 10.7 fL (ref 7.5–12.5)
Monocytes Relative: 10.3 %
NEUTROS ABS: 2622 {cells}/uL (ref 1500–7800)
Neutrophils Relative %: 57 %
PLATELETS: 140 10*3/uL (ref 140–400)
RBC: 5.15 10*6/uL (ref 4.20–5.80)
RDW: 12.6 % (ref 11.0–15.0)
TOTAL LYMPHOCYTE: 30.4 %
WBC mixed population: 474 cells/uL (ref 200–950)
WBC: 4.6 10*3/uL (ref 3.8–10.8)

## 2016-11-28 ENCOUNTER — Other Ambulatory Visit: Payer: Self-pay | Admitting: Cardiology

## 2016-11-28 ENCOUNTER — Other Ambulatory Visit: Payer: Self-pay | Admitting: Family Medicine

## 2016-11-30 DIAGNOSIS — F331 Major depressive disorder, recurrent, moderate: Secondary | ICD-10-CM | POA: Diagnosis not present

## 2016-11-30 DIAGNOSIS — G894 Chronic pain syndrome: Secondary | ICD-10-CM | POA: Diagnosis not present

## 2016-12-02 ENCOUNTER — Ambulatory Visit: Payer: Medicare Other | Admitting: "Endocrinology

## 2016-12-06 ENCOUNTER — Other Ambulatory Visit: Payer: Self-pay

## 2016-12-06 ENCOUNTER — Ambulatory Visit: Payer: Medicare Other | Admitting: "Endocrinology

## 2016-12-06 DIAGNOSIS — E291 Testicular hypofunction: Secondary | ICD-10-CM

## 2016-12-06 MED ORDER — TESTOSTERONE CYPIONATE 200 MG/ML IM SOLN: 100.0000 mg | INTRAMUSCULAR | 0 refills | Status: DC

## 2016-12-09 ENCOUNTER — Other Ambulatory Visit: Payer: Medicare Other

## 2016-12-09 DIAGNOSIS — E291 Testicular hypofunction: Secondary | ICD-10-CM | POA: Diagnosis not present

## 2016-12-10 LAB — TESTOSTERONE,FREE AND TOTAL
TESTOSTERONE FREE: 28.6 pg/mL — AB (ref 6.6–18.1)
Testosterone: 978 ng/dL — ABNORMAL HIGH (ref 264–916)

## 2016-12-13 ENCOUNTER — Encounter: Payer: Self-pay | Admitting: "Endocrinology

## 2016-12-13 ENCOUNTER — Ambulatory Visit (INDEPENDENT_AMBULATORY_CARE_PROVIDER_SITE_OTHER): Payer: Medicare Other | Admitting: "Endocrinology

## 2016-12-13 VITALS — BP 133/72 | HR 78 | Ht 73.0 in | Wt 251.0 lb

## 2016-12-13 DIAGNOSIS — E291 Testicular hypofunction: Secondary | ICD-10-CM

## 2016-12-13 NOTE — Progress Notes (Signed)
Subjective:    Patient ID: Jeff Stewart, male    DOB: [birth year], PCP Jeff Stewart, Jeff Kaufmann, MD   Past Medical History:  Diagnosis Date  . Anxiety   . Carpal tunnel syndrome, bilateral   . Cataract    removed years ago  . Chronic back pain    internal morphine pump  . DDD (degenerative disc disease)    neck, lumbar  . Depression   . Dyslipidemia    diet controlled  . Hypertension    borderline  . Sleep apnea    wears C-PAP   Past Surgical History:  Procedure Laterality Date  . BACK SURGERY     x 6  . CARPAL TUNNEL RELEASE     bilateral  . COLONOSCOPY    . ELBOW SURGERY    . INGUINAL HERNIA REPAIR Right   . KNEE ARTHROSCOPY    . NASAL SEPTUM SURGERY     x 6  . neck fusion     . NISSEN FUNDOPLICATION    . STOMACH SURGERY    . UPPER GASTROINTESTINAL ENDOSCOPY     Social History   Social History  . Marital status: Married    Spouse name: N/A  . Number of children: 3  . Years of education: N/A   Occupational History  . 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 Smoking status: Never Smoker  . Smokeless tobacco: Never Used  . Alcohol use No  . Drug use: No  . Sexual activity: Not Currently    Birth control/ protection: Post-menopausal     Comment: married for 1972   Other Topics Concern  . Not on file   Social History Narrative   Lives with wife    Outpatient Encounter Prescriptions as of 12/13/2016  Medication Sig  . fluticasone (FLONASE) 50 MCG/ACT nasal spray Place 1 spray into both nostrils 2 (two) times daily as needed for allergies or rhinitis.  . hydrochlorothiazide (HYDRODIURIL) 25 MG tablet TAKE 1 TABLET DAILY  . hydrocortisone (ANUSOL-HC) 25 MG suppository Place 1 suppository (25 mg total) rectally 4 (four) times daily as needed for hemorrhoids.  Marland Kitchen losartan (COZAAR) 50 MG tablet Take 1 tablet (50 mg total) by mouth 2 (two) times daily.  Marland Kitchen morphine (MSIR) 30 MG tablet Take 30 mg by mouth at bedtime as needed.  Marland Kitchen  morphine (ROXANOL) 20 MG/ML concentrated solution Take by mouth. pump  . omeprazole (PRILOSEC) 20 MG capsule TAKE  (1)  CAPSULE  TWICE DAILY (TAKE ON AN EMPTY STOMACH AT LEAST 30MIN- UTES BEFORE MEALS).  Marland Kitchen sertraline (ZOLOFT) 100 MG tablet TAKE 1 TABLET DAILY  . Syringe/Needle, Disp, (SYRINGE 3CC/21GX1-1/4") 21G X 1-1/4" 3 ML MISC 1 each by Does not apply route once a week.  . testosterone cypionate (DEPOTESTOSTERONE CYPIONATE) 200 MG/ML injection Inject 0.5 mLs (100 mg total) into the muscle once a week.  . [DISCONTINUED] losartan (COZAAR) 50 MG tablet TAKE  (1)  TABLET TWICE A DAY.   Facility-Administered Encounter Medications as of 12/13/2016  Medication  . 0.9 %  sodium chloride infusion   ALLERGIES: Allergies  Allergen Reactions  . Nubain [Nalbuphine Hcl] Rash   VACCINATION STATUS: Immunization History  Administered Date(s) Administered  . Influenza,inj,Quad PF,6+ Mos 01/14/2015, 02/02/2016  . Tdap 12/11/2014    HPI Jeff Stewart is 61 year old gentleman with above medical history. He is here to follow-up with labs for hypogonadism. - He is on testosterone 100 mg IM weekly.  He feels much better. He is here to follow-up with repeat labs showing Continued improvement in his testosterone to 509.  He was known to have low testosterone for at least since age 61.   He fathers 3 grown children. He denies history of head injury , has had nasal septal surgery multiple times. He denies injury to the testicles, exposure to chemotherapy, exposure to radiation to the genitals. However, due to chronic back injury and surgery he has exposure to heavy opioid therapy for pain control. He is currently on morphine pump. Overall on pain control for the last 10-15 years .   Review of Systems Constitutional: He has lost 10 lbs since November 2017. no subjective hyperthermia/hypothermia Eyes: no blurry vision, no xerophthalmia ENT: no sore throat, no nodules palpated in throat, no  dysphagia/odynophagia, no hoarseness Cardiovascular: no CP/SOB/palpitations/leg swelling Respiratory: no cough/SOB Gastrointestinal: no N/V/D/C Musculoskeletal: no muscle/joint aches Skin: no rashes,  Genitourinary: Reports improved libido  Neurological: no tremors/numbness/tingling/dizziness Psychiatric: no depression/anxiety  Objective:    BP 133/72   Pulse 78   Ht 6\' 1"  (1.854 m)   Wt 251 lb (113.9 kg)   BMI 33.12 kg/m   Wt Readings from Last 3 Encounters:  12/13/16 251 lb (113.9 kg)  11/22/16 248 lb 6 oz (112.7 kg)  09/05/16 251 lb (113.9 kg)    Physical Exam   Constitutional: overweight, in NAD Eyes: PERRLA, EOMI, no exophthalmos ENT: moist mucous membranes, no thyromegaly, no cervical lymphadenopathy Cardiovascular: RRR, No MRG Respiratory: CTA B Gastrointestinal: abdomen soft, NT, ND, BS+, rectal exam deferred (patient stated he had rectal exam last year by his primary medical doctor, reportedly no prostate enlargement) Genitourinary system: He has bilaterally shrunk testicles, right testes 12 mL, left testes 15 mL. No scrotal mass, no varicocele,  no inguinal lymphadenopathy. Musculoskeletal: Large extremities patient explains for medial, no deformities, strength intact in all 4 Skin: moist, warm, no rashes Neurological: no tremor with outstretched hands, DTR normal in all 4   Recent Results (from the past 2160 hour(s))  HIV antibody     Status: None   Collection Time: 11/22/16  9:06 AM  Result Value Ref Range   HIV Screen 4th Generation wRfx Non Reactive Non Reactive  Hepatitis C antibody     Status: None   Collection Time: 11/22/16  9:06 AM  Result Value Ref Range   Hep C Virus Ab <0.1 0.0 - 0.9 s/co ratio    Comment:                                   Negative:     < 0.8                              Indeterminate: 0.8 - 0.9                                   Positive:     > 0.9  The CDC recommends that a positive HCV antibody result  be followed up with a  HCV Nucleic Acid Amplification  test (151761).   CBC with Differential/Platelet     Status: None   Collection Time: 11/24/16  8:45 AM  Result Value Ref Range   WBC 4.6 3.8 - 10.8 Thousand/uL   RBC 5.15 4.20 - 5.80 Million/uL  Hemoglobin 14.4 13.2 - 17.1 g/dL   HCT 44.2 38.5 - 50.0 %   MCV 85.8 80.0 - 100.0 fL   MCH 28.0 27.0 - 33.0 pg   MCHC 32.6 32.0 - 36.0 g/dL   RDW 12.6 11.0 - 15.0 %   Platelets 140 140 - 400 Thousand/uL   MPV 10.7 7.5 - 12.5 fL   Neutro Abs 2,622 1,500 - 7,800 cells/uL   Lymphs Abs 1,398 850 - 3,900 cells/uL   WBC mixed population 474 200 - 950 cells/uL   Eosinophils Absolute 87 15 - 500 cells/uL   Basophils Absolute 18 0 - 200 cells/uL   Neutrophils Relative % 57 %   Total Lymphocyte 30.4 %   Monocytes Relative 10.3 %   Eosinophils Relative 1.9 %   Basophils Relative 0.4 %  Hepatic function panel     Status: None   Collection Time: 11/24/16  8:45 AM  Result Value Ref Range   Total Protein 6.7 6.1 - 8.1 g/dL   Albumin 4.3 3.6 - 5.1 g/dL   Globulin 2.4 1.9 - 3.7 g/dL (calc)   AG Ratio 1.8 1.0 - 2.5 (calc)   Total Bilirubin 0.5 0.2 - 1.2 mg/dL   Bilirubin, Direct 0.1 0.0 - 0.2 mg/dL   Indirect Bilirubin 0.4 0.2 - 1.2 mg/dL (calc)   Alkaline phosphatase (APISO) 74 40 - 115 U/L   AST 17 10 - 35 U/L   ALT 15 9 - 46 U/L  Testosterone,Free and Total     Status: Abnormal   Collection Time: 12/09/16 12:00 AM  Result Value Ref Range   Testosterone 978 (H) 264 - 916 ng/dL    Comment: Adult male reference interval is based on a population of healthy nonobese males (BMI <30) between 29 and 32 years old. [place], [place] [phone]. PMID: [id number].    Testosterone, Free 28.6 (H) 6.6 - 18.1 pg/mL     Assessment & Plan:   1. Hypogonadism  -Testosterone replacement helped improve his testosterone from 74 ->286 -> 782 -> 509 > 978  No adverse events noted. -Gonadotropins levels indicate a component of secondary hypogonadism given his low  normal LH of 1.6 (normal 1.5-9.3) .  -It is possible that it is multifactorial including exposure to heavy opioids, multiple back surgeries, prior inguinal surgery, or idiopathic.  -Prolactin level is favorable at 6.1, he would not need pituitary/sella imaging for now.  -He will benefit from continued replacement of testosterone. -I advised him to continue 100 mg of testosterone cypionate IM weekly for target testosterone level of 400-600 mcg/dL.  -He will return in 3 months with lab work for CBC , total testosterone, transaminases . -He is advised to maintain his annual physical with his primary medical doctor and obtain PSA.    - I advised patient to maintain close follow up with Jeff Stewart, Jeff Kaufmann, MD for primary care needs.  Follow up plan: Return in about 3 months (around 03/14/2017) for follow up with pre-visit labs.  Glade Lloyd, MD Phone: [phone]  Fax: [phone]   This note was partially dictated with voice recognition software. Similar sounding words can be transcribed inadequately or may not  be corrected upon review.  12/13/2016, 12:25 PM

## 2016-12-28 DIAGNOSIS — M5136 Other intervertebral disc degeneration, lumbar region: Secondary | ICD-10-CM | POA: Diagnosis not present

## 2016-12-28 DIAGNOSIS — M961 Postlaminectomy syndrome, not elsewhere classified: Secondary | ICD-10-CM | POA: Diagnosis not present

## 2016-12-28 DIAGNOSIS — Z9689 Presence of other specified functional implants: Secondary | ICD-10-CM | POA: Diagnosis not present

## 2016-12-28 DIAGNOSIS — G894 Chronic pain syndrome: Secondary | ICD-10-CM | POA: Diagnosis not present

## 2016-12-30 ENCOUNTER — Other Ambulatory Visit: Payer: Self-pay | Admitting: Cardiology

## 2016-12-30 NOTE — Telephone Encounter (Signed)
REFILL 

## 2017-01-23 NOTE — Progress Notes (Signed)
HPI The patient presents for evaluation of chest pain and arm pain.  In the past he had a negative POET (Plain Old Exercise Treadmill) for evaluation of chest pain.Marland Kitchen  At [facility] he also had a negative Lexiscan Myoview in 2015.    Since I last saw him he has had no new cardiac complaints.  The patient denies any new symptoms such as chest discomfort, neck or arm discomfort. There has been no new shortness of breath, PND or orthopnea. There have been no reported palpitations, presyncope or syncope.  He is limited by back pain and has chronic pain management.  He is apparently going to get a spinal cord stimulator to see if this helps.  Allergies  Allergen Reactions  . Nubain [Nalbuphine Hcl] Rash    Current Outpatient Medications  Medication Sig Dispense Refill  . fluticasone (FLONASE) 50 MCG/ACT nasal spray Place 1 spray into both nostrils 2 (two) times daily as needed for allergies or rhinitis. 16 g 6  . hydrochlorothiazide (HYDRODIURIL) 25 MG tablet Take 25 mg daily by mouth.    . hydrocortisone (ANUSOL-HC) 25 MG suppository Place 1 suppository (25 mg total) rectally 4 (four) times daily as needed for hemorrhoids. 12 suppository 2  . losartan (COZAAR) 50 MG tablet Take 1 tablet (50 mg total) 2 (two) times daily by mouth. Take 50 mg 2 (two) times daily by mouth. 180 tablet 3  . morphine (ROXANOL) 20 MG/ML concentrated solution Take by mouth. pump    . omeprazole (PRILOSEC) 20 MG capsule TAKE  (1)  CAPSULE  TWICE DAILY (TAKE ON AN EMPTY STOMACH AT LEAST 30MIN- UTES BEFORE MEALS). 180 capsule 6  . sertraline (ZOLOFT) 100 MG tablet TAKE 1 TABLET DAILY 90 tablet 0  . testosterone cypionate (DEPOTESTOSTERONE CYPIONATE) 200 MG/ML injection Inject 0.5 mLs (100 mg total) into the muscle once a week. 10 mL 0  . Syringe/Needle, Disp, (SYRINGE 3CC/21GX1-1/4") 21G X 1-1/4" 3 ML MISC 1 each by Does not apply route once a week. 50 each 0   Current Facility-Administered Medications  Medication Dose Route  Frequency Provider Last Rate Last Dose  . 0.9 %  sodium chloride infusion  500 mL Intravenous Continuous Ladene Artist, MD        Past Medical History:  Diagnosis Date  . Anxiety   . Carpal tunnel syndrome, bilateral   . Cataract    removed years ago  . Chronic back pain    internal morphine pump  . DDD (degenerative disc disease)    neck, lumbar  . Depression   . Dyslipidemia    diet controlled  . Hypertension    borderline  . Sleep apnea    wears C-PAP    Past Surgical History:  Procedure Laterality Date  . BACK SURGERY     x 6  . CARPAL TUNNEL RELEASE     bilateral  . COLONOSCOPY    . ELBOW SURGERY    . INGUINAL HERNIA REPAIR Right   . KNEE ARTHROSCOPY    . NASAL SEPTUM SURGERY     x 6  . neck fusion     . NISSEN FUNDOPLICATION    . STOMACH SURGERY    . UPPER GASTROINTESTINAL ENDOSCOPY      ROS: As stated in the HPI and negative for all other systems.  PHYSICAL EXAM BP 122/70   Pulse 71   Ht 6\' 1"  (1.854 m)   Wt 249 lb (112.9 kg)   BMI 32.85 kg/m   GENERAL:  Well appearing NECK:  No jugular venous distention, waveform within normal limits, carotid upstroke brisk and symmetric, no bruits, no thyromegaly LUNGS:  Clear to auscultation bilaterally CHEST:  Unremarkable HEART:  PMI not displaced or sustained,S1 and S2 within normal limits, no S3, no S4, no clicks, no rubs, no murmurs ABD:  Flat, positive bowel sounds normal in frequency in pitch, no bruits, no rebound, no guarding, no midline pulsatile mass, no hepatomegaly, no splenomegaly, pain pump in right lower quadrant.   EXT:  2 plus pulses throughout, no edema, no cyanosis no clubbing   EKG: Sinus rhythm, rate 71, axis within normal limits, intervals within normal limits, no acute ST-T wave changes.  01/25/2017   ASSESSMENT AND PLAN  NECK/SHOULDER PAIN/BACK:    The patient continues to have chronic back pain.  However, this is being managed by pain management.  There is no suggestion that this is  ischemic.  He is had no symptoms since his previous workup.   HTN:  His BP is well controlled.  He will continue the meds as listed.   I will ask Dettinger, Fransisca Kaufmann, MD if he would be able to prescribe his antihypertensive regimen.  He could then follow-up with me as needed.

## 2017-01-25 ENCOUNTER — Ambulatory Visit (INDEPENDENT_AMBULATORY_CARE_PROVIDER_SITE_OTHER): Payer: Medicare Other | Admitting: Cardiology

## 2017-01-25 ENCOUNTER — Encounter: Payer: Self-pay | Admitting: Cardiology

## 2017-01-25 VITALS — BP 122/70 | HR 71 | Ht 73.0 in | Wt 249.0 lb

## 2017-01-25 DIAGNOSIS — I10 Essential (primary) hypertension: Secondary | ICD-10-CM

## 2017-01-25 MED ORDER — LOSARTAN POTASSIUM 50 MG PO TABS: 50.0000 mg | ORAL_TABLET | Freq: Two times a day (BID) | ORAL | 3 refills | Status: DC

## 2017-01-25 NOTE — Patient Instructions (Signed)
Medication Instructions:  The current medical regimen is effective;  continue present plan and medications.  Follow-Up: Follow up as needed with Dr Hochrein.  Thank you for choosing [facility]!!     

## 2017-02-06 DIAGNOSIS — M961 Postlaminectomy syndrome, not elsewhere classified: Secondary | ICD-10-CM | POA: Diagnosis not present

## 2017-02-08 DIAGNOSIS — M961 Postlaminectomy syndrome, not elsewhere classified: Secondary | ICD-10-CM | POA: Diagnosis not present

## 2017-02-08 DIAGNOSIS — G894 Chronic pain syndrome: Secondary | ICD-10-CM | POA: Diagnosis not present

## 2017-02-08 DIAGNOSIS — Z9689 Presence of other specified functional implants: Secondary | ICD-10-CM | POA: Diagnosis not present

## 2017-02-08 DIAGNOSIS — M5030 Other cervical disc degeneration, unspecified cervical region: Secondary | ICD-10-CM | POA: Diagnosis not present

## 2017-03-02 ENCOUNTER — Other Ambulatory Visit: Payer: Self-pay | Admitting: Family Medicine

## 2017-03-07 ENCOUNTER — Encounter: Payer: Self-pay | Admitting: Physician Assistant

## 2017-03-07 ENCOUNTER — Ambulatory Visit (INDEPENDENT_AMBULATORY_CARE_PROVIDER_SITE_OTHER): Payer: Medicare Other | Admitting: Physician Assistant

## 2017-03-07 VITALS — BP 135/78 | HR 57 | Temp 97.2°F | Ht 73.0 in | Wt 247.8 lb

## 2017-03-07 DIAGNOSIS — J019 Acute sinusitis, unspecified: Secondary | ICD-10-CM

## 2017-03-07 DIAGNOSIS — J40 Bronchitis, not specified as acute or chronic: Secondary | ICD-10-CM

## 2017-03-07 MED ORDER — HYDROCODONE-HOMATROPINE 5-1.5 MG/5ML PO SYRP: 5.0000 mL | ORAL_SOLUTION | Freq: Four times a day (QID) | ORAL | 0 refills | Status: DC | PRN

## 2017-03-07 MED ORDER — DOXYCYCLINE HYCLATE 100 MG PO TABS: 100.0000 mg | ORAL_TABLET | Freq: Two times a day (BID) | ORAL | 0 refills | Status: DC

## 2017-03-07 MED ORDER — PREDNISONE 10 MG (21) PO TBPK: ORAL_TABLET | ORAL | 0 refills | Status: DC

## 2017-03-07 NOTE — Patient Instructions (Signed)
In a few days you may receive a survey in the mail or online from [employer] regarding your visit with us today. Please take a moment to fill this out. Your feedback is very important to our whole office. It can help us better understand your needs as well as improve your experience and satisfaction. Thank you for taking your time to complete it. We care about you.  Khiana Camino, PA-C  

## 2017-03-07 NOTE — Progress Notes (Signed)
BP 135/78   Pulse (!) 57   Temp (!) 97.2 F (36.2 C) (Oral)   Ht 6\' 1"  (1.854 m)   Wt 247 lb 12.8 oz (112.4 kg)   BMI 32.69 kg/m    Subjective:    Patient ID: Jeff Stewart, male    DOB: 1955-10-11, 61 y.o.   MRN: [id number]  HPI: Jeff Stewart is a 61 y.o. male presenting on 03/07/2017 for Sinusitis  Patient with several days of progressing upper respiratory and bronchial symptoms. Initially there was more upper respiratory congestion. This progressed to having significant cough that is productive throughout the day and severe at night. There is occasional wheezing after coughing. Sometimes there is slight dyspnea on exertion. It is productive mucus that is yellow in color. Denies any blood.   Relevant past medical, surgical, family and social history reviewed and updated as indicated. Allergies and medications reviewed and updated.  Past Medical History:  Diagnosis Date  . Anxiety   . Carpal tunnel syndrome, bilateral   . Cataract    removed years ago  . Chronic back pain    internal morphine pump  . DDD (degenerative disc disease)    neck, lumbar  . Depression   . Dyslipidemia    diet controlled  . Hypertension    borderline  . Sleep apnea    wears C-PAP    Past Surgical History:  Procedure Laterality Date  . BACK SURGERY     x 6  . CARPAL TUNNEL RELEASE     bilateral  . COLONOSCOPY    . ELBOW SURGERY    . INGUINAL HERNIA REPAIR Right   . KNEE ARTHROSCOPY    . NASAL SEPTUM SURGERY     x 6  . neck fusion     . NISSEN FUNDOPLICATION    . STOMACH SURGERY    . UPPER GASTROINTESTINAL ENDOSCOPY      Review of Systems  Constitutional: Positive for fatigue and fever. Negative for appetite change.  HENT: Positive for congestion, sinus pressure, sinus pain and sore throat.   Eyes: Negative.  Negative for pain and visual disturbance.  Respiratory: Positive for cough, shortness of breath and wheezing. Negative for chest tightness.   Cardiovascular:  Negative.  Negative for chest pain, palpitations and leg swelling.  Gastrointestinal: Negative.  Negative for abdominal pain, diarrhea, nausea and vomiting.  Endocrine: Negative.   Genitourinary: Negative.   Musculoskeletal: Negative for back pain and myalgias.  Skin: Negative.  Negative for color change and rash.  Neurological: Negative for weakness, numbness and headaches.  Psychiatric/Behavioral: Negative.     Allergies as of 03/07/2017      Reactions   Nubain [nalbuphine Hcl] Rash      Medication List        Accurate as of 03/07/17 10:52 AM. Always use your most recent med list.          doxycycline 100 MG tablet Commonly known as:  VIBRA-TABS Take 1 tablet (100 mg total) by mouth 2 (two) times daily. 1 po bid   fluticasone 50 MCG/ACT nasal spray Commonly known as:  FLONASE Place 1 spray into both nostrils 2 (two) times daily as needed for allergies or rhinitis.   hydrochlorothiazide 25 MG tablet Commonly known as:  HYDRODIURIL TAKE 1 TABLET DAILY   HYDROcodone-homatropine 5-1.5 MG/5ML syrup Commonly known as:  HYCODAN Take 5 mLs by mouth every 6 (six) hours as needed for cough.   hydrocortisone 25 MG suppository Commonly known as:  ANUSOL-HC Place 1 suppository (25 mg total) rectally 4 (four) times daily as needed for hemorrhoids.   losartan 50 MG tablet Commonly known as:  COZAAR Take 1 tablet (50 mg total) 2 (two) times daily by mouth. Take 50 mg 2 (two) times daily by mouth.   morphine 20 MG/ML concentrated solution Commonly known as:  ROXANOL Take by mouth. pump   omeprazole 20 MG capsule Commonly known as:  PRILOSEC TAKE  (1)  CAPSULE  TWICE DAILY (TAKE ON AN EMPTY STOMACH AT LEAST 30MIN- UTES BEFORE MEALS).   predniSONE 10 MG (21) Tbpk tablet Commonly known as:  STERAPRED UNI-PAK 21 TAB As directed x 6 days   sertraline 100 MG tablet Commonly known as:  ZOLOFT TAKE 1 TABLET DAILY   SYRINGE 3CC/21GX1-1/4" 21G X 1-1/4" 3 ML Misc 1 each by Does  not apply route once a week.   testosterone cypionate 200 MG/ML injection Commonly known as:  DEPOTESTOSTERONE CYPIONATE Inject 0.5 mLs (100 mg total) into the muscle once a week.          Objective:    BP 135/78   Pulse (!) 57   Temp (!) 97.2 F (36.2 C) (Oral)   Ht 6\' 1"  (1.854 m)   Wt 247 lb 12.8 oz (112.4 kg)   BMI 32.69 kg/m   Allergies  Allergen Reactions  . Nubain [Nalbuphine Hcl] Rash    Physical Exam  Constitutional: He appears well-developed and well-nourished.  HENT:  Head: Normocephalic and atraumatic.  Right Ear: Hearing and tympanic membrane normal.  Left Ear: Hearing and tympanic membrane normal.  Nose: Mucosal edema and sinus tenderness present. No nasal deformity. Right sinus exhibits frontal sinus tenderness. Left sinus exhibits frontal sinus tenderness.  Mouth/Throat: Posterior oropharyngeal erythema present.  Eyes: Conjunctivae and EOM are normal. Pupils are equal, round, and reactive to light. Right eye exhibits no discharge. Left eye exhibits no discharge.  Neck: Normal range of motion. Neck supple.  Cardiovascular: Normal rate, regular rhythm and normal heart sounds.  Pulmonary/Chest: Effort normal. No respiratory distress. He has no decreased breath sounds. He has wheezes. He has no rhonchi. He has no rales.  Abdominal: Soft. Bowel sounds are normal.  Musculoskeletal: Normal range of motion.  Skin: Skin is warm and dry.  Nursing note and vitals reviewed.   Results for orders placed or performed in visit on 12/06/16  Testosterone,Free and Total  Result Value Ref Range   Testosterone 978 (H) 264 - 916 ng/dL   Testosterone, Free 28.6 (H) 6.6 - 18.1 pg/mL      Assessment & Plan:   1. Acute non-recurrent sinusitis, unspecified location - doxycycline (VIBRA-TABS) 100 MG tablet; Take 1 tablet (100 mg total) by mouth 2 (two) times daily. 1 po bid  Dispense: 20 tablet; Refill: 0  2. Bronchitis - doxycycline (VIBRA-TABS) 100 MG tablet; Take 1  tablet (100 mg total) by mouth 2 (two) times daily. 1 po bid  Dispense: 20 tablet; Refill: 0 - predniSONE (STERAPRED UNI-PAK 21 TAB) 10 MG (21) TBPK tablet; As directed x 6 days  Dispense: 21 tablet; Refill: 0 - HYDROcodone-homatropine (HYCODAN) 5-1.5 MG/5ML syrup; Take 5 mLs by mouth every 6 (six) hours as needed for cough.  Dispense: 120 mL; Refill: 0    Current Outpatient Medications:  .  doxycycline (VIBRA-TABS) 100 MG tablet, Take 1 tablet (100 mg total) by mouth 2 (two) times daily. 1 po bid, Disp: 20 tablet, Rfl: 0 .  fluticasone (FLONASE) 50 MCG/ACT nasal spray, Place 1  spray into both nostrils 2 (two) times daily as needed for allergies or rhinitis., Disp: 16 g, Rfl: 6 .  hydrochlorothiazide (HYDRODIURIL) 25 MG tablet, TAKE 1 TABLET DAILY, Disp: 90 tablet, Rfl: 0 .  HYDROcodone-homatropine (HYCODAN) 5-1.5 MG/5ML syrup, Take 5 mLs by mouth every 6 (six) hours as needed for cough., Disp: 120 mL, Rfl: 0 .  hydrocortisone (ANUSOL-HC) 25 MG suppository, Place 1 suppository (25 mg total) rectally 4 (four) times daily as needed for hemorrhoids., Disp: 12 suppository, Rfl: 2 .  losartan (COZAAR) 50 MG tablet, Take 1 tablet (50 mg total) 2 (two) times daily by mouth. Take 50 mg 2 (two) times daily by mouth., Disp: 180 tablet, Rfl: 3 .  morphine (ROXANOL) 20 MG/ML concentrated solution, Take by mouth. pump, Disp: , Rfl:  .  omeprazole (PRILOSEC) 20 MG capsule, TAKE  (1)  CAPSULE  TWICE DAILY (TAKE ON AN EMPTY STOMACH AT LEAST 30MIN- UTES BEFORE MEALS)., Disp: 180 capsule, Rfl: 6 .  predniSONE (STERAPRED UNI-PAK 21 TAB) 10 MG (21) TBPK tablet, As directed x 6 days, Disp: 21 tablet, Rfl: 0 .  sertraline (ZOLOFT) 100 MG tablet, TAKE 1 TABLET DAILY, Disp: 90 tablet, Rfl: 0 .  Syringe/Needle, Disp, (SYRINGE 3CC/21GX1-1/4") 21G X 1-1/4" 3 ML MISC, 1 each by Does not apply route once a week., Disp: 50 each, Rfl: 0 .  testosterone cypionate (DEPOTESTOSTERONE CYPIONATE) 200 MG/ML injection, Inject 0.5 mLs  (100 mg total) into the muscle once a week., Disp: 10 mL, Rfl: 0  Current Facility-Administered Medications:  .  0.9 %  sodium chloride infusion, 500 mL, Intravenous, Continuous, Ladene Artist, MD Continue all other maintenance medications as listed above.  Follow up plan: Return if symptoms worsen or fail to improve.  Educational handout given for [place] PA-C [place] [address]  [place], [place] [address] [phone]   03/07/2017, 10:52 AM

## 2017-03-15 ENCOUNTER — Other Ambulatory Visit: Payer: Self-pay | Admitting: Family Medicine

## 2017-03-15 DIAGNOSIS — K219 Gastro-esophageal reflux disease without esophagitis: Secondary | ICD-10-CM | POA: Diagnosis not present

## 2017-03-15 DIAGNOSIS — Z888 Allergy status to other drugs, medicaments and biological substances status: Secondary | ICD-10-CM | POA: Diagnosis not present

## 2017-03-15 DIAGNOSIS — G4733 Obstructive sleep apnea (adult) (pediatric): Secondary | ICD-10-CM | POA: Diagnosis not present

## 2017-03-15 DIAGNOSIS — Z7951 Long term (current) use of inhaled steroids: Secondary | ICD-10-CM | POA: Diagnosis not present

## 2017-03-15 DIAGNOSIS — M5416 Radiculopathy, lumbar region: Secondary | ICD-10-CM | POA: Diagnosis not present

## 2017-03-15 DIAGNOSIS — G8929 Other chronic pain: Secondary | ICD-10-CM | POA: Diagnosis not present

## 2017-03-15 DIAGNOSIS — M961 Postlaminectomy syndrome, not elsewhere classified: Secondary | ICD-10-CM | POA: Diagnosis not present

## 2017-03-15 DIAGNOSIS — M1990 Unspecified osteoarthritis, unspecified site: Secondary | ICD-10-CM | POA: Diagnosis not present

## 2017-03-15 DIAGNOSIS — F329 Major depressive disorder, single episode, unspecified: Secondary | ICD-10-CM | POA: Diagnosis not present

## 2017-03-15 DIAGNOSIS — M5030 Other cervical disc degeneration, unspecified cervical region: Secondary | ICD-10-CM | POA: Diagnosis not present

## 2017-03-15 DIAGNOSIS — Z79899 Other long term (current) drug therapy: Secondary | ICD-10-CM | POA: Diagnosis not present

## 2017-03-15 DIAGNOSIS — M545 Low back pain: Secondary | ICD-10-CM | POA: Diagnosis not present

## 2017-03-15 DIAGNOSIS — Z9989 Dependence on other enabling machines and devices: Secondary | ICD-10-CM | POA: Diagnosis not present

## 2017-03-15 DIAGNOSIS — Z79890 Hormone replacement therapy (postmenopausal): Secondary | ICD-10-CM | POA: Diagnosis not present

## 2017-03-22 ENCOUNTER — Ambulatory Visit: Payer: Medicare Other | Admitting: "Endocrinology

## 2017-03-23 DIAGNOSIS — G894 Chronic pain syndrome: Secondary | ICD-10-CM | POA: Diagnosis not present

## 2017-03-23 DIAGNOSIS — M5136 Other intervertebral disc degeneration, lumbar region: Secondary | ICD-10-CM | POA: Diagnosis not present

## 2017-03-23 DIAGNOSIS — M961 Postlaminectomy syndrome, not elsewhere classified: Secondary | ICD-10-CM | POA: Diagnosis not present

## 2017-03-23 DIAGNOSIS — Z9689 Presence of other specified functional implants: Secondary | ICD-10-CM | POA: Diagnosis not present

## 2017-03-31 ENCOUNTER — Other Ambulatory Visit: Payer: Self-pay

## 2017-03-31 ENCOUNTER — Other Ambulatory Visit: Payer: Medicare Other

## 2017-03-31 DIAGNOSIS — E291 Testicular hypofunction: Secondary | ICD-10-CM

## 2017-04-01 LAB — HEPATIC FUNCTION PANEL
ALK PHOS: 82 IU/L (ref 39–117)
ALT: 22 IU/L (ref 0–44)
AST: 23 IU/L (ref 0–40)
Albumin: 4.2 g/dL (ref 3.6–4.8)
Bilirubin Total: 0.3 mg/dL (ref 0.0–1.2)
Bilirubin, Direct: 0.11 mg/dL (ref 0.00–0.40)
Total Protein: 6.6 g/dL (ref 6.0–8.5)

## 2017-04-01 LAB — CBC WITH DIFFERENTIAL/PLATELET
Basophils Absolute: 0 10*3/uL (ref 0.0–0.2)
Basos: 0 %
EOS (ABSOLUTE): 0.1 10*3/uL (ref 0.0–0.4)
EOS: 3 %
HEMATOCRIT: 42.4 % (ref 37.5–51.0)
HEMOGLOBIN: 14.1 g/dL (ref 13.0–17.7)
Immature Grans (Abs): 0 10*3/uL (ref 0.0–0.1)
Immature Granulocytes: 0 %
LYMPHS ABS: 1.2 10*3/uL (ref 0.7–3.1)
Lymphs: 29 %
MCH: 28 pg (ref 26.6–33.0)
MCHC: 33.3 g/dL (ref 31.5–35.7)
MCV: 84 fL (ref 79–97)
MONOCYTES: 9 %
MONOS ABS: 0.4 10*3/uL (ref 0.1–0.9)
NEUTROS ABS: 2.4 10*3/uL (ref 1.4–7.0)
Neutrophils: 59 %
Platelets: 188 10*3/uL (ref 150–379)
RBC: 5.04 x10E6/uL (ref 4.14–5.80)
RDW: 14.1 % (ref 12.3–15.4)
WBC: 4.1 10*3/uL (ref 3.4–10.8)

## 2017-04-01 LAB — TESTOSTERONE,FREE AND TOTAL
Testosterone, Free: 17.8 pg/mL (ref 6.6–18.1)
Testosterone: 659 ng/dL (ref 264–916)

## 2017-04-06 ENCOUNTER — Other Ambulatory Visit: Payer: Self-pay

## 2017-04-06 MED ORDER — TESTOSTERONE CYPIONATE 200 MG/ML IM SOLN: 100.0000 mg | INTRAMUSCULAR | 0 refills | Status: DC

## 2017-04-07 ENCOUNTER — Ambulatory Visit: Payer: Medicare Other | Admitting: "Endocrinology

## 2017-04-12 ENCOUNTER — Encounter: Payer: Self-pay | Admitting: "Endocrinology

## 2017-04-12 ENCOUNTER — Ambulatory Visit (INDEPENDENT_AMBULATORY_CARE_PROVIDER_SITE_OTHER): Payer: Medicare Other | Admitting: "Endocrinology

## 2017-04-12 VITALS — BP 139/85 | HR 69 | Ht 73.0 in | Wt 253.0 lb

## 2017-04-12 DIAGNOSIS — E291 Testicular hypofunction: Secondary | ICD-10-CM

## 2017-04-12 MED ORDER — TESTOSTERONE CYPIONATE 200 MG/ML IM SOLN: 100.0000 mg | INTRAMUSCULAR | 0 refills | Status: DC

## 2017-04-12 NOTE — Progress Notes (Signed)
Subjective:    Patient ID: Jeff Stewart, male    DOB: 07-26-1955, PCP Dettinger, Fransisca Kaufmann, MD   Past Medical History:  Diagnosis Date  . Anxiety   . Carpal tunnel syndrome, bilateral   . Cataract    removed years ago  . Chronic back pain    internal morphine pump  . DDD (degenerative disc disease)    neck, lumbar  . Depression   . Dyslipidemia    diet controlled  . Hypertension    borderline  . Sleep apnea    wears C-PAP   Past Surgical History:  Procedure Laterality Date  . BACK SURGERY     x 6  . CARPAL TUNNEL RELEASE     bilateral  . COLONOSCOPY    . ELBOW SURGERY    . INGUINAL HERNIA REPAIR Right   . KNEE ARTHROSCOPY    . NASAL SEPTUM SURGERY     x 6  . neck fusion     . NISSEN FUNDOPLICATION    . STOMACH SURGERY    . UPPER GASTROINTESTINAL ENDOSCOPY     Social History   Socioeconomic History  . Marital status: Married    Spouse name: None  . Number of children: 3  . Years of education: None  . Highest education level: None  Social Needs  . Financial resource strain: None  . Food insecurity - worry: None  . Food insecurity - inability: None  . Transportation needs - medical: None  . Transportation needs - non-medical: None  Occupational History  . Occupation: [employer]: Retired  Tobacco Use  . Smoking status: Never Smoker  . Smokeless tobacco: Never Used  Substance and Sexual Activity  . Alcohol use: No  . Drug use: No  . Sexual activity: Not Currently    Birth control/protection: Post-menopausal    Comment: married for 1972  Other Topics Concern  . None  Social History Narrative   Lives with wife    Outpatient Encounter Medications as of 04/12/2017  Medication Sig  . fluticasone (FLONASE) 50 MCG/ACT nasal spray Place 1 spray into both nostrils 2 (two) times daily as needed for allergies or rhinitis.  . hydrochlorothiazide (HYDRODIURIL) 25 MG tablet TAKE 1 TABLET DAILY  . HYDROcodone-homatropine (HYCODAN) 5-1.5  MG/5ML syrup Take 5 mLs by mouth every 6 (six) hours as needed for cough.  . hydrocortisone (ANUSOL-HC) 25 MG suppository Place 1 suppository (25 mg total) rectally 4 (four) times daily as needed for hemorrhoids.  Marland Kitchen losartan (COZAAR) 50 MG tablet Take 1 tablet (50 mg total) 2 (two) times daily by mouth. Take 50 mg 2 (two) times daily by mouth.  . morphine (ROXANOL) 20 MG/ML concentrated solution Take by mouth. pump  . omeprazole (PRILOSEC) 20 MG capsule TAKE  (1)  CAPSULE  TWICE DAILY (TAKE ON AN EMPTY STOMACH AT LEAST 30MIN- UTES BEFORE MEALS).  . predniSONE (STERAPRED UNI-PAK 21 TAB) 10 MG (21) TBPK tablet As directed x 6 days  . sertraline (ZOLOFT) 100 MG tablet TAKE 1 TABLET DAILY  . Syringe/Needle, Disp, (SYRINGE 3CC/21GX1-1/4") 21G X 1-1/4" 3 ML MISC 1 each by Does not apply route once a week.  . testosterone cypionate (DEPOTESTOSTERONE CYPIONATE) 200 MG/ML injection Inject 0.5 mLs (100 mg total) into the muscle once a week.  . [DISCONTINUED] doxycycline (VIBRA-TABS) 100 MG tablet Take 1 tablet (100 mg total) by mouth 2 (two) times daily. 1 po bid  . [DISCONTINUED] testosterone cypionate (DEPOTESTOSTERONE CYPIONATE)  200 MG/ML injection Inject 0.5 mLs (100 mg total) into the muscle once a week.   Facility-Administered Encounter Medications as of 04/12/2017  Medication  . 0.9 %  sodium chloride infusion   ALLERGIES: Allergies  Allergen Reactions  . Nubain [Nalbuphine Hcl] Rash   VACCINATION STATUS: Immunization History  Administered Date(s) Administered  . Influenza,inj,Quad PF,6+ Mos 01/14/2015, 02/02/2016  . Tdap 12/11/2014    HPI Jeff Stewart is 62 year old gentleman with above medical history. He is here to follow-up with labs for hypogonadism. - He is on testosterone 100 mg IM weekly. He feels much better. He is here to follow-up with repeat labs showing Continued improvement in his testosterone to 659. - He denies any new complaints today. He was known to have low testosterone  for at least since age 62.   He fathers 3 grown children. He denies history of head injury , has had nasal septal surgery multiple times. He denies injury to the testicles, exposure to chemotherapy, exposure to radiation to the genitals. However, due to chronic back injury and surgery he has exposure to heavy opioid therapy for pain control. He is currently on morphine pump. Overall on  opioids for pain control for the last 10-15 years.   Review of Systems Constitutional: He has lost 15 lbs since November 2017. no subjective hyperthermia/hypothermia Eyes: no blurry vision, no xerophthalmia ENT: no sore throat, no nodules palpated in throat, no dysphagia/odynophagia, no hoarseness Cardiovascular: No chest pain, no shortness of breath, no palpations. Respiratory: no cough/SOB Gastrointestinal: no N/V/D/C Musculoskeletal: no muscle/joint aches Skin: no rashes,  Genitourinary: Reports improved libido  Neurological: no tremors/numbness/tingling/dizziness Psychiatric: no depression/anxiety  Objective:    BP 139/85   Pulse 69   Ht 6\' 1"  (1.854 m)   Wt 253 lb (114.8 kg)   BMI 33.38 kg/m   Wt Readings from Last 3 Encounters:  04/12/17 253 lb (114.8 kg)  03/07/17 247 lb 12.8 oz (112.4 kg)  01/25/17 249 lb (112.9 kg)    Physical Exam   Constitutional:  obese, not in acute distress.  Eyes: PERRLA, EOMI, no exophthalmos ENT: moist mucous membranes, no thyromegaly, no cervical lymphadenopathy Cardiovascular: RRR, No MRG Respiratory: CTA B Gastrointestinal: abdomen soft, NT, ND, BS+, rectal exam deferred (patient stated he had rectal exam last year by his primary medical doctor, reportedly no prostate enlargement) Genitourinary system: He has bilaterally shrunk testicles, right testes 12 mL, left testes 15 mL. No scrotal mass, no varicocele,  no inguinal lymphadenopathy. Musculoskeletal: Large extremities patient explains this feature is familial, no deformities, strength intact in all  4 Skin: moist, warm, no rashes Neurological: no tremor with outstretched hands, DTR normal in all 4   Recent Results (from the past 2160 hour(s))  Testosterone,Free and Total     Status: None   Collection Time: 03/31/17  9:10 AM  Result Value Ref Range   Testosterone 659 264 - 916 ng/dL    Comment: Adult male reference interval is based on a population of healthy nonobese males (BMI <30) between 83 and 22 years old. [place], [place] [phone]. PMID: [id number].    Testosterone, Free 17.8 6.6 - 18.1 pg/mL  CBC with Differential/Platelet     Status: None   Collection Time: 03/31/17  9:10 AM  Result Value Ref Range   WBC 4.1 3.4 - 10.8 x10E3/uL   RBC 5.04 4.14 - 5.80 x10E6/uL   Hemoglobin 14.1 13.0 - 17.7 g/dL   Hematocrit 42.4 37.5 - 51.0 %   MCV 84 79 -  97 fL   MCH 28.0 26.6 - 33.0 pg   MCHC 33.3 31.5 - 35.7 g/dL   RDW 14.1 12.3 - 15.4 %   Platelets 188 150 - 379 x10E3/uL   Neutrophils 59 Not Estab. %   Lymphs 29 Not Estab. %   Monocytes 9 Not Estab. %   Eos 3 Not Estab. %   Basos 0 Not Estab. %   Neutrophils Absolute 2.4 1.4 - 7.0 x10E3/uL   Lymphocytes Absolute 1.2 0.7 - 3.1 x10E3/uL   Monocytes Absolute 0.4 0.1 - 0.9 x10E3/uL   EOS (ABSOLUTE) 0.1 0.0 - 0.4 x10E3/uL   Basophils Absolute 0.0 0.0 - 0.2 x10E3/uL   Immature Granulocytes 0 Not Estab. %   Immature Grans (Abs) 0.0 0.0 - 0.1 x10E3/uL  Hepatic function panel     Status: None   Collection Time: 03/31/17  9:10 AM  Result Value Ref Range   Total Protein 6.6 6.0 - 8.5 g/dL   Albumin 4.2 3.6 - 4.8 g/dL   Bilirubin Total 0.3 0.0 - 1.2 mg/dL   Bilirubin, Direct 0.11 0.00 - 0.40 mg/dL   Alkaline Phosphatase 82 39 - 117 IU/L   AST 23 0 - 40 IU/L   ALT 22 0 - 44 IU/L     Assessment & Plan:   1. Hypogonadism  -Testosterone replacement helped improve his testosterone from 74 ->286 -> 782 -> 509 > 978 > 659  No adverse events noted. -Gonadotropins levels indicate a component of secondary hypogonadism  given his low normal LH of 1.6 (normal 1.5-9.3) .  -It is possible that it is multifactorial including exposure to heavy opioids, multiple back surgeries, prior inguinal surgery, or idiopathic.  -Prolactin level is favorable at 6.1, he would not need pituitary/sella imaging for now.  -He will benefit from continued replacement of testosterone. -I advised him to continue 100 mg of testosterone cypionate IM weekly for target testosterone level of 400-600 mcg/dL.  -He will return in in 4 months with total and free testosterone measurements.   -He is advised to maintain his annual physical with his primary medical doctor and obtain PSA.    - I advised patient to maintain close follow up with Dettinger, Fransisca Kaufmann, MD for primary care needs.  Follow up plan: Return in about 4 months (around 08/10/2017) for follow up with pre-visit labs.  Glade Lloyd, MD Phone: [phone]  Fax: [phone]   This note was partially dictated with voice recognition software. Similar sounding words can be transcribed inadequately or may not  be corrected upon review.  04/12/2017, 1:56 PM

## 2017-05-01 DIAGNOSIS — G894 Chronic pain syndrome: Secondary | ICD-10-CM | POA: Diagnosis not present

## 2017-05-01 DIAGNOSIS — M961 Postlaminectomy syndrome, not elsewhere classified: Secondary | ICD-10-CM | POA: Diagnosis not present

## 2017-05-01 DIAGNOSIS — Z9689 Presence of other specified functional implants: Secondary | ICD-10-CM | POA: Diagnosis not present

## 2017-05-11 ENCOUNTER — Ambulatory Visit (INDEPENDENT_AMBULATORY_CARE_PROVIDER_SITE_OTHER): Payer: Medicare Other | Admitting: Family Medicine

## 2017-05-11 ENCOUNTER — Encounter: Payer: Self-pay | Admitting: Family Medicine

## 2017-05-11 ENCOUNTER — Ambulatory Visit (HOSPITAL_COMMUNITY)
Admission: RE | Admit: 2017-05-11 | Discharge: 2017-05-11 | Disposition: A | Discharge status: Home / Self Care | Payer: Medicare Other | Source: Ambulatory Visit | Attending: Family Medicine | Admitting: Family Medicine

## 2017-05-11 VITALS — BP 131/83 | HR 80 | Temp 97.4°F | Ht 73.0 in | Wt 253.0 lb

## 2017-05-11 DIAGNOSIS — R600 Localized edema: Secondary | ICD-10-CM

## 2017-05-11 DIAGNOSIS — Z125 Encounter for screening for malignant neoplasm of prostate: Secondary | ICD-10-CM | POA: Diagnosis not present

## 2017-05-11 DIAGNOSIS — E291 Testicular hypofunction: Secondary | ICD-10-CM

## 2017-05-11 NOTE — Progress Notes (Signed)
BP 131/83   Pulse 80   Temp (!) 97.4 F (36.3 C) (Oral)   Ht '6\' 1"'$  (1.854 m)   Wt 253 lb (114.8 kg)   BMI 33.38 kg/m    Subjective:    Patient ID: Jeff Stewart, male    DOB: 1955/07/08, 62 y.o.   MRN: [id number]  HPI: Jeff Stewart is a 62 y.o. male presenting on 05/11/2017 for Edema and pain in left leg/knee   HPI Pt presents today with left leg swelling and knee swelling.  Also c/o that his right leg also swells. Both legs have pain, including posterior left leg. This issue has been ongoing for over 1 yr and he was prescribed HCTZ, he says it has worsened over the past couple weeks. He is taking this daily and reported this is helping with blood pressure control. He checks it regularly and says it goes up and down, but he has not had any really high blood pressure episodes. His leg swelling and pain is worse at night and when he is sitting up in a chair for a long time. Says his socks get tight when he is walking for a while. He has had a scope on one of his knees (cannot remember which), that reported a hole on his knee cap. He has been taking ibuprofen and icing his knee, which has provided moderate relief. No hx of heart disease. No recent trauma to knee. No SOB or chest pain. No fevers, warmth or erythema to knee.   Relevant past medical, surgical, family and social history reviewed and updated as indicated. Interim medical history since our last visit reviewed. Allergies and medications reviewed and updated.  Review of Systems  Constitutional: Negative for fever.  Respiratory: Negative for chest tightness and shortness of breath.   Cardiovascular: Positive for leg swelling. Negative for chest pain.  Gastrointestinal: Negative for diarrhea and nausea.  Musculoskeletal: Positive for joint swelling and myalgias.    Per HPI unless specifically indicated above   Allergies as of 05/11/2017      Reactions   Nubain [nalbuphine Hcl] Rash      Medication List        Accurate as of 05/11/17  2:56 PM. Always use your most recent med list.          fluticasone 50 MCG/ACT nasal spray Commonly known as:  FLONASE Place 1 spray into both nostrils 2 (two) times daily as needed for allergies or rhinitis.   hydrochlorothiazide 25 MG tablet Commonly known as:  HYDRODIURIL TAKE 1 TABLET DAILY   hydrocortisone 25 MG suppository Commonly known as:  ANUSOL-HC Place 1 suppository (25 mg total) rectally 4 (four) times daily as needed for hemorrhoids.   losartan 50 MG tablet Commonly known as:  COZAAR Take 1 tablet (50 mg total) 2 (two) times daily by mouth. Take 50 mg 2 (two) times daily by mouth.   morphine 20 MG/ML concentrated solution Commonly known as:  ROXANOL Take by mouth. pump   omeprazole 20 MG capsule Commonly known as:  PRILOSEC TAKE  (1)  CAPSULE  TWICE DAILY (TAKE ON AN EMPTY STOMACH AT LEAST 30MIN- UTES BEFORE MEALS).   sertraline 100 MG tablet Commonly known as:  ZOLOFT TAKE 1 TABLET DAILY   SYRINGE 3CC/21GX1-1/4" 21G X 1-1/4" 3 ML Misc 1 each by Does not apply route once a week.   testosterone cypionate 200 MG/ML injection Commonly known as:  DEPOTESTOSTERONE CYPIONATE Inject 0.5 mLs (100 mg total) into the muscle  once a week.          Objective:    BP 131/83   Pulse 80   Temp (!) 97.4 F (36.3 C) (Oral)   Ht '6\' 1"'$  (1.854 m)   Wt 253 lb (114.8 kg)   BMI 33.38 kg/m   Wt Readings from Last 3 Encounters:  05/11/17 253 lb (114.8 kg)  04/12/17 253 lb (114.8 kg)  03/07/17 247 lb 12.8 oz (112.4 kg)    Physical Exam  Constitutional: He is oriented to person, place, and time. He appears well-developed and well-nourished.  Neck: Neck supple. No thyromegaly present.  Cardiovascular: Normal rate, regular rhythm and normal pulses.  Pulmonary/Chest: Effort normal and breath sounds normal. He has no decreased breath sounds. He has no rhonchi. He has no rales.  Musculoskeletal: He exhibits edema (2+ in bilateral legs).       Left  knee: He exhibits swelling. Tenderness (mild tenderness to palpation of PCL) found.  + rubor on dependency on bilateral legs. Thickened toenails on bilateral feet.  Neurological: He is alert and oriented to person, place, and time.        Assessment & Plan:   Problem List Items Addressed This Visit      Endocrine   Hypogonadism, male   Relevant Orders   PSA, total and free    Other Visit Diagnoses    Bilateral lower extremity edema    -  Primary   Relevant Orders   CMP14+EGFR   TSH   US Venous Img Lower Bilateral   Prostate cancer screening       Relevant Orders   PSA, total and free      Instruct patient to wear compression stockings to encourage blood flow, if ultrasound for DVT is negative. Order [place] of bilateral lower extremities.  Follow up plan: Return if symptoms worsen or fail to improve.  Counseling provided for all of the vaccine components Orders Placed This Encounter  Procedures  . US Venous Img Lower Bilateral  . CMP14+EGFR  . TSH  . PSA, total and free    Patient was seen and examined with Chaney Malling PA student, agree with assessment and plan above. Caryl Pina, MD [facility] 05/12/2017, 8:02 AM

## 2017-05-12 ENCOUNTER — Other Ambulatory Visit: Payer: Self-pay

## 2017-05-12 DIAGNOSIS — I10 Essential (primary) hypertension: Secondary | ICD-10-CM

## 2017-05-12 DIAGNOSIS — Z0000 Encounter for general adult medical examination without abnormal findings: Secondary | ICD-10-CM

## 2017-05-12 DIAGNOSIS — K6289 Other specified diseases of anus and rectum: Secondary | ICD-10-CM

## 2017-05-12 DIAGNOSIS — E291 Testicular hypofunction: Secondary | ICD-10-CM

## 2017-05-24 ENCOUNTER — Other Ambulatory Visit: Payer: Medicare Other

## 2017-05-24 DIAGNOSIS — E291 Testicular hypofunction: Secondary | ICD-10-CM | POA: Diagnosis not present

## 2017-05-24 DIAGNOSIS — I10 Essential (primary) hypertension: Secondary | ICD-10-CM

## 2017-05-24 DIAGNOSIS — Z125 Encounter for screening for malignant neoplasm of prostate: Secondary | ICD-10-CM | POA: Diagnosis not present

## 2017-05-24 DIAGNOSIS — Z0000 Encounter for general adult medical examination without abnormal findings: Secondary | ICD-10-CM

## 2017-05-24 DIAGNOSIS — K6289 Other specified diseases of anus and rectum: Secondary | ICD-10-CM

## 2017-05-25 LAB — CMP14+EGFR
ALK PHOS: 75 IU/L (ref 39–117)
ALT: 33 IU/L (ref 0–44)
AST: 26 IU/L (ref 0–40)
Albumin/Globulin Ratio: 2 (ref 1.2–2.2)
Albumin: 4.3 g/dL (ref 3.6–4.8)
BILIRUBIN TOTAL: 0.6 mg/dL (ref 0.0–1.2)
BUN/Creatinine Ratio: 14 (ref 10–24)
BUN: 13 mg/dL (ref 8–27)
CO2: 28 mmol/L (ref 20–29)
Calcium: 9.2 mg/dL (ref 8.6–10.2)
Chloride: 99 mmol/L (ref 96–106)
Creatinine, Ser: 0.92 mg/dL (ref 0.76–1.27)
GFR calc non Af Amer: 88 mL/min/{1.73_m2} (ref >59)
GFR, EST AFRICAN AMERICAN: 101 mL/min/{1.73_m2} (ref >59)
GLUCOSE: 82 mg/dL (ref 65–99)
Globulin, Total: 2.2 g/dL (ref 1.5–4.5)
Potassium: 4.2 mmol/L (ref 3.5–5.2)
Sodium: 142 mmol/L (ref 134–144)
TOTAL PROTEIN: 6.5 g/dL (ref 6.0–8.5)

## 2017-05-25 LAB — THYROID PANEL WITH TSH
FREE THYROXINE INDEX: 1.4 (ref 1.2–4.9)
T3 Uptake Ratio: 23 % — ABNORMAL LOW (ref 24–39)
T4, Total: 6 ug/dL (ref 4.5–12.0)
TSH: 4.18 u[IU]/mL (ref 0.450–4.500)

## 2017-05-25 LAB — PSA, TOTAL AND FREE
PROSTATE SPECIFIC AG, SERUM: 1 ng/mL (ref 0.0–4.0)
PSA FREE PCT: 33 %
PSA FREE: 0.33 ng/mL

## 2017-06-03 ENCOUNTER — Other Ambulatory Visit: Payer: Self-pay | Admitting: Family Medicine

## 2017-06-08 DIAGNOSIS — M5136 Other intervertebral disc degeneration, lumbar region: Secondary | ICD-10-CM | POA: Diagnosis not present

## 2017-06-08 DIAGNOSIS — G894 Chronic pain syndrome: Secondary | ICD-10-CM | POA: Diagnosis not present

## 2017-06-08 DIAGNOSIS — M961 Postlaminectomy syndrome, not elsewhere classified: Secondary | ICD-10-CM | POA: Diagnosis not present

## 2017-06-08 DIAGNOSIS — Z5181 Encounter for therapeutic drug level monitoring: Secondary | ICD-10-CM | POA: Diagnosis not present

## 2017-06-08 DIAGNOSIS — Z79899 Other long term (current) drug therapy: Secondary | ICD-10-CM | POA: Diagnosis not present

## 2017-06-20 ENCOUNTER — Other Ambulatory Visit: Payer: Self-pay | Admitting: Family Medicine

## 2017-07-19 DIAGNOSIS — Z9889 Other specified postprocedural states: Secondary | ICD-10-CM | POA: Diagnosis not present

## 2017-07-19 DIAGNOSIS — G894 Chronic pain syndrome: Secondary | ICD-10-CM | POA: Diagnosis not present

## 2017-08-10 ENCOUNTER — Ambulatory Visit: Payer: Medicare Other | Admitting: "Endocrinology

## 2017-08-28 ENCOUNTER — Encounter: Payer: Self-pay | Admitting: Family Medicine

## 2017-08-28 ENCOUNTER — Ambulatory Visit (INDEPENDENT_AMBULATORY_CARE_PROVIDER_SITE_OTHER): Payer: Medicare Other | Admitting: Family Medicine

## 2017-08-28 ENCOUNTER — Other Ambulatory Visit: Payer: Self-pay | Admitting: "Endocrinology

## 2017-08-28 ENCOUNTER — Other Ambulatory Visit: Payer: Medicare Other

## 2017-08-28 VITALS — BP 140/89 | HR 65 | Temp 97.4°F | Ht 73.0 in | Wt 252.0 lb

## 2017-08-28 DIAGNOSIS — M545 Low back pain: Secondary | ICD-10-CM | POA: Diagnosis not present

## 2017-08-28 DIAGNOSIS — N3000 Acute cystitis without hematuria: Secondary | ICD-10-CM

## 2017-08-28 DIAGNOSIS — E291 Testicular hypofunction: Secondary | ICD-10-CM

## 2017-08-28 LAB — URINALYSIS, COMPLETE
Bilirubin, UA: NEGATIVE
Glucose, UA: NEGATIVE
KETONES UA: NEGATIVE
Nitrite, UA: NEGATIVE
Protein, UA: NEGATIVE
RBC, UA: NEGATIVE
SPEC GRAV UA: 1.015 (ref 1.005–1.030)
Urobilinogen, Ur: 0.2 mg/dL (ref 0.2–1.0)
pH, UA: 8.5 — ABNORMAL HIGH (ref 5.0–7.5)

## 2017-08-28 LAB — MICROSCOPIC EXAMINATION
Bacteria, UA: NONE SEEN
Epithelial Cells (non renal): NONE SEEN /hpf (ref 0–10)
RENAL EPITHEL UA: NONE SEEN /HPF

## 2017-08-28 MED ORDER — CEPHALEXIN 500 MG PO CAPS: 500.0000 mg | ORAL_CAPSULE | Freq: Two times a day (BID) | ORAL | 0 refills | Status: AC

## 2017-08-28 NOTE — Patient Instructions (Signed)
Your urine had white blood cells in it today.  I am treating you for urinary tract infection with Keflex twice a day for the next 7 days.  I will contact you with the results of your urine culture.  If your symptoms significantly worsen, you develop blood in your urine, fevers, nausea, vomiting, please seek immediate medical attention the emergency department.   Pyelonephritis, Adult Pyelonephritis is a kidney infection. The kidneys are organs that help clean your blood by moving waste out of your blood and into your pee (urine). This infection can happen quickly, or it can last for a long time. In most cases, it clears up with treatment and does not cause other problems. Follow these instructions at home: Medicines  Take over-the-counter and prescription medicines only as told by your doctor.  Take your antibiotic medicine as told by your doctor. Do not stop taking the medicine even if you start to feel better. General instructions  Drink enough fluid to keep your pee clear or pale yellow.  Avoid caffeine, tea, and carbonated drinks.  Pee (urinate) often. Avoid holding in pee for long periods of time.  Pee before and after sex.  After pooping (having a bowel movement), women should wipe from front to back. Use each tissue only once.  Keep all follow-up visits as told by your doctor. This is important. Contact a doctor if:  You do not feel better after 2 days.  Your symptoms get worse.  You have a fever. Get help right away if:  You cannot take your medicine or drink fluids as told.  You have chills and shaking.  You throw up (vomit).  You have very bad pain in your side (flank) or back.  You feel very weak or you pass out (faint). This information is not intended to replace advice given to you by your health care provider. Make sure you discuss any questions you have with your health care provider. Document Released: 04/14/2004 Document Revised: 08/13/2015 Document  Reviewed: 06/30/2014 Elsevier Interactive Patient Education  [employer].

## 2017-08-28 NOTE — Progress Notes (Signed)
Subjective: CC:? Kidney infection PCP: Dettinger, Fransisca Kaufmann, MD OHY:[id number] Jeff Stewart is a 62 y.o. male presenting to clinic today for:  1. Urinary symptoms Patient reports a 1 day h/o right sided flank pain, increased urinary frequency and urinary urgency .  Denies hematuria, fevers, chills, abdominal pain, nausea, vomiting.  Patient has used his regular pain for symptoms.  Patient denies a h/o frequent or recurrent UTIs.  He thinks he has had a urinary tract infection in the past as well as renal stones.  He notes that symptoms do not seem consistent with his typical back pain, for which she is on chronic opioid medications and has a back stimulator in place.    ROS: Per HPI  Allergies  Allergen Reactions  . Nubain [Nalbuphine Hcl] Rash   Past Medical History:  Diagnosis Date  . Anxiety   . Carpal tunnel syndrome, bilateral   . Cataract    removed years ago  . Chronic back pain    internal morphine pump  . DDD (degenerative disc disease)    neck, lumbar  . Depression   . Dyslipidemia    diet controlled  . Hypertension    borderline  . Sleep apnea    wears C-PAP    Current Outpatient Medications:  .  fluticasone (FLONASE) 50 MCG/ACT nasal spray, Place 1 spray into both nostrils 2 (two) times daily as needed for allergies or rhinitis., Disp: 16 g, Rfl: 6 .  hydrochlorothiazide (HYDRODIURIL) 25 MG tablet, TAKE 1 TABLET DAILY, Disp: 90 tablet, Rfl: 0 .  hydrocortisone (ANUSOL-HC) 25 MG suppository, Place 1 suppository (25 mg total) rectally 4 (four) times daily as needed for hemorrhoids., Disp: 12 suppository, Rfl: 2 .  losartan (COZAAR) 50 MG tablet, Take 1 tablet (50 mg total) 2 (two) times daily by mouth. Take 50 mg 2 (two) times daily by mouth., Disp: 180 tablet, Rfl: 3 .  morphine (ROXANOL) 20 MG/ML concentrated solution, Take by mouth. pump, Disp: , Rfl:  .  omeprazole (PRILOSEC) 20 MG capsule, TAKE  (1)  CAPSULE  TWICE DAILY (TAKE ON AN EMPTY STOMACH AT LEAST 30MIN-  UTES BEFORE MEALS)., Disp: 180 capsule, Rfl: 6 .  sertraline (ZOLOFT) 100 MG tablet, TAKE 1 TABLET DAILY, Disp: 90 tablet, Rfl: 0 .  Syringe/Needle, Disp, (SYRINGE 3CC/21GX1-1/4") 21G X 1-1/4" 3 ML MISC, 1 each by Does not apply route once a week., Disp: 50 each, Rfl: 0 .  testosterone cypionate (DEPOTESTOSTERONE CYPIONATE) 200 MG/ML injection, Inject 0.5 mLs (100 mg total) into the muscle once a week., Disp: 10 mL, Rfl: 0  Current Facility-Administered Medications:  .  0.9 %  sodium chloride infusion, 500 mL, Intravenous, Continuous, Ladene Artist, MD Social History   Socioeconomic History  . Marital status: Married    Spouse name: Not on file  . Number of children: 3  . Years of education: Not on file  . Highest education level: Not on file  Occupational History  . 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  Scientific laboratory technician  . Financial resource strain: Not on file  . Food insecurity:    Worry: Not on file    Inability: Not on file  . Transportation needs:    Medical: Not on file    Non-medical: Not on file  Tobacco Use  . Smoking status: Never Smoker  . Smokeless tobacco: Never Used  Substance and Sexual Activity  . Alcohol use: No  . Drug use: No  . Sexual activity: Not  Currently    Birth control/protection: Post-menopausal    Comment: married for 1972  Lifestyle  . Physical activity:    Days per week: Not on file    Minutes per session: Not on file  . Stress: Not on file  Relationships  . Social connections:    Talks on phone: Not on file    Gets together: Not on file    Attends religious service: Not on file    Active member of club or organization: Not on file    Attends meetings of clubs or organizations: Not on file    Relationship status: Not on file  . Intimate partner violence:    Fear of current or ex partner: Not on file    Emotionally abused: Not on file    Physically abused: Not on file    Forced sexual activity: Not on file  Other Topics  Concern  . Not on file  Social History Narrative   Lives with wife    Family History  Problem Relation Age of Onset  . CAD Father 46  . Emphysema Father   . Stroke Mother 36  . CAD Brother 37       CABG  . Alcohol abuse Brother   . Hypertension Brother   . Stroke Brother   . CAD Brother   . Cirrhosis Brother   . Alcohol abuse Brother   . Hypertension Brother   . CAD Sister   . Hypertension Sister   . Hyperlipidemia Sister   . Cancer Sister        melanoma  . Cancer Sister   . Cancer Brother   . Cancer Brother     Objective: Office vital signs reviewed. BP 140/89   Pulse 65   Temp (!) 97.4 F (36.3 C) (Oral)   Ht 6\' 1"  (1.854 m)   Wt 252 lb (114.3 kg)   BMI 33.25 kg/m   Physical Examination:  General: Awake, alert, well nourished, nontoxic, No acute distress Extremities: warm, well perfused, No edema, cyanosis or clubbing; +2 pulses bilaterally MSK: normal gait and normal station; Right sided CVA TTP present GU: minimal suprapubic TTP  Assessment/ Plan: 62 y.o. male   1. Acute cystitis without hematuria UA with trace leukocytes.  This was also present on urine microscopy.  Urine culture ordered. Patient is afebrile nontoxic-appearing.  Physical exam notable for mild suprapubic tenderness to palpation and CVA tenderness to palpation.  Possibly early pyelonephritis.  Though, urine microscopy did not demonstrate significant bacteria.  Given his concomitant urinary symptoms, will proceed with empiric treatment of urinary tract infection with Keflex for the next 7 days.  Home care instructions were reviewed.  Push oral fluids.  If symptoms persist or worsen, I did instruct him to go to the emergency department for evaluation.  Low threshold for CT renal scan to evaluate for possible renal stones as this is amongst the differential diagnosis for this patient.  Patient was good understanding. - Urine Culture  Orders Placed This Encounter  Procedures  . Urine Culture  .  Urinalysis, Complete   Meds ordered this encounter  Medications  . cephALEXin (KEFLEX) 500 MG capsule    Sig: Take 1 capsule (500 mg total) by mouth 2 (two) times daily for 7 days.    Dispense:  14 capsule    Refill:  [place], DO [place] [phone]

## 2017-08-29 DIAGNOSIS — Z981 Arthrodesis status: Secondary | ICD-10-CM | POA: Diagnosis not present

## 2017-08-29 DIAGNOSIS — M5136 Other intervertebral disc degeneration, lumbar region: Secondary | ICD-10-CM | POA: Diagnosis not present

## 2017-08-29 DIAGNOSIS — M5186 Other intervertebral disc disorders, lumbar region: Secondary | ICD-10-CM | POA: Diagnosis not present

## 2017-08-29 DIAGNOSIS — Z4789 Encounter for other orthopedic aftercare: Secondary | ICD-10-CM | POA: Diagnosis not present

## 2017-08-29 DIAGNOSIS — G894 Chronic pain syndrome: Secondary | ICD-10-CM | POA: Diagnosis not present

## 2017-08-29 DIAGNOSIS — M961 Postlaminectomy syndrome, not elsewhere classified: Secondary | ICD-10-CM | POA: Diagnosis not present

## 2017-08-29 DIAGNOSIS — M5030 Other cervical disc degeneration, unspecified cervical region: Secondary | ICD-10-CM | POA: Diagnosis not present

## 2017-08-29 DIAGNOSIS — Z9689 Presence of other specified functional implants: Secondary | ICD-10-CM | POA: Diagnosis not present

## 2017-08-29 DIAGNOSIS — M5134 Other intervertebral disc degeneration, thoracic region: Secondary | ICD-10-CM | POA: Diagnosis not present

## 2017-08-29 LAB — HEPATIC FUNCTION PANEL
ALBUMIN: 4.3 g/dL (ref 3.6–4.8)
ALT: 23 IU/L (ref 0–44)
AST: 20 IU/L (ref 0–40)
Alkaline Phosphatase: 77 IU/L (ref 39–117)
BILIRUBIN TOTAL: 0.5 mg/dL (ref 0.0–1.2)
Bilirubin, Direct: 0.12 mg/dL (ref 0.00–0.40)
TOTAL PROTEIN: 6.7 g/dL (ref 6.0–8.5)

## 2017-08-29 LAB — CBC WITH DIFFERENTIAL/PLATELET

## 2017-08-29 LAB — TESTOSTERONE: Testosterone: 437 ng/dL (ref 264–916)

## 2017-08-30 LAB — URINE CULTURE

## 2017-09-07 ENCOUNTER — Encounter: Payer: Self-pay | Admitting: "Endocrinology

## 2017-09-07 ENCOUNTER — Ambulatory Visit (INDEPENDENT_AMBULATORY_CARE_PROVIDER_SITE_OTHER): Payer: Medicare Other | Admitting: "Endocrinology

## 2017-09-07 VITALS — BP 128/73 | HR 68 | Ht 73.0 in | Wt 253.0 lb

## 2017-09-07 DIAGNOSIS — E291 Testicular hypofunction: Secondary | ICD-10-CM

## 2017-09-07 MED ORDER — TESTOSTERONE CYPIONATE 200 MG/ML IM SOLN: 100.0000 mg | INTRAMUSCULAR | 0 refills | Status: DC

## 2017-09-07 NOTE — Progress Notes (Signed)
Subjective:    Patient ID: Jeff Stewart, male    DOB: Apr 06, 1955, PCP Dettinger, Fransisca Kaufmann, MD   Past Medical History:  Diagnosis Date  . Anxiety   . Carpal tunnel syndrome, bilateral   . Cataract    removed years ago  . Chronic back pain    internal morphine pump  . DDD (degenerative disc disease)    neck, lumbar  . Depression   . Dyslipidemia    diet controlled  . Hypertension    borderline  . Sleep apnea    wears C-PAP   Past Surgical History:  Procedure Laterality Date  . BACK SURGERY     x 6  . CARPAL TUNNEL RELEASE     bilateral  . COLONOSCOPY    . ELBOW SURGERY    . INGUINAL HERNIA REPAIR Right   . KNEE ARTHROSCOPY    . NASAL SEPTUM SURGERY     x 6  . neck fusion     . NISSEN FUNDOPLICATION    . STOMACH SURGERY    . UPPER GASTROINTESTINAL ENDOSCOPY     Social History   Socioeconomic History  . Marital status: Married    Spouse name: Not on file  . Number of children: 3  . Years of education: Not on file  . Highest education level: Not on file  Occupational History  . 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  Scientific laboratory technician  . Financial resource strain: Not on file  . Food insecurity:    Worry: Not on file    Inability: Not on file  . Transportation needs:    Medical: Not on file    Non-medical: Not on file  Tobacco Use  . Smoking status: Never Smoker  . Smokeless tobacco: Never Used  Substance and Sexual Activity  . Alcohol use: No  . Drug use: No  . Sexual activity: Not Currently    Birth control/protection: Post-menopausal    Comment: married for 1972  Lifestyle  . Physical activity:    Days per week: Not on file    Minutes per session: Not on file  . Stress: Not on file  Relationships  . Social connections:    Talks on phone: Not on file    Gets together: Not on file    Attends religious service: Not on file    Active member of club or organization: Not on file    Attends meetings of clubs or organizations: Not on  file    Relationship status: Not on file  Other Topics Concern  . Not on file  Social History Narrative   Lives with wife    Outpatient Encounter Medications as of 09/07/2017  Medication Sig  . fluticasone (FLONASE) 50 MCG/ACT nasal spray Place 1 spray into both nostrils 2 (two) times daily as needed for allergies or rhinitis.  . hydrochlorothiazide (HYDRODIURIL) 25 MG tablet TAKE 1 TABLET DAILY  . hydrocortisone (ANUSOL-HC) 25 MG suppository Place 1 suppository (25 mg total) rectally 4 (four) times daily as needed for hemorrhoids.  Marland Kitchen losartan (COZAAR) 50 MG tablet Take 1 tablet (50 mg total) 2 (two) times daily by mouth. Take 50 mg 2 (two) times daily by mouth.  . morphine (ROXANOL) 20 MG/ML concentrated solution Take by mouth. pump  . omeprazole (PRILOSEC) 20 MG capsule TAKE  (1)  CAPSULE  TWICE DAILY (TAKE ON AN EMPTY STOMACH AT LEAST 30MIN- UTES BEFORE MEALS).  Marland Kitchen sertraline (ZOLOFT) 100 MG tablet TAKE 1  TABLET DAILY  . Syringe/Needle, Disp, (SYRINGE 3CC/21GX1-1/4") 21G X 1-1/4" 3 ML MISC 1 each by Does not apply route once a week.  . testosterone cypionate (DEPOTESTOSTERONE CYPIONATE) 200 MG/ML injection Inject 0.5 mLs (100 mg total) into the muscle once a week.  . [DISCONTINUED] testosterone cypionate (DEPOTESTOSTERONE CYPIONATE) 200 MG/ML injection Inject 0.5 mLs (100 mg total) into the muscle once a week.   Facility-Administered Encounter Medications as of 09/07/2017  Medication  . 0.9 %  sodium chloride infusion   ALLERGIES: Allergies  Allergen Reactions  . Nubain [Nalbuphine Hcl] Rash   VACCINATION STATUS: Immunization History  Administered Date(s) Administered  . Influenza,inj,Quad PF,6+ Mos 01/14/2015, 02/02/2016  . Tdap 12/11/2014    HPI [name] is 62 year old gentleman with above medical history. He is here to follow-up with labs for hypogonadism. - He is on testosterone 100 mg IM weekly. He feels much better. He is here to follow-up with repeat labs showing total  testosterone at target at 437.   - He denies any new complaints today.  In the interim, he had his PSA checked which is within normal limits. He was known to have low testosterone for at least since age 24.  He fathers 3 grown children. He denies history of head injury , has had nasal septal surgery multiple times. He denies injury to the testicles, exposure to chemotherapy, exposure to radiation to the genitals. However, due to chronic back injury and surgery he has exposure to heavy opioid therapy for pain control. He is currently on morphine pump. Overall on  opioids for pain control for the last 10-15 years.   Review of Systems Constitutional: He has steady weight. no subjective hyperthermia/hypothermia Eyes: no blurry vision, no xerophthalmia ENT: no sore throat, no nodules palpated in throat, no dysphagia/odynophagia, no hoarseness Cardiovascular: No chest pain, no palpitations.    Respiratory: no cough/SOB Gastrointestinal: no N/V/D/C Musculoskeletal: no muscle/joint aches Skin: no rashes,  Genitourinary: Reports improved libido  Neurological: no tremors/numbness/tingling/dizziness Psychiatric: no depression/anxiety  Objective:    BP 128/73   Pulse 68   Ht 6\' 1"  (1.854 m)   Wt 253 lb (114.8 kg)   BMI 33.38 kg/m   Wt Readings from Last 3 Encounters:  09/07/17 253 lb (114.8 kg)  08/28/17 252 lb (114.3 kg)  05/11/17 253 lb (114.8 kg)    Physical Exam   Constitutional:  + Obese, + fatigue, not in acute distress.   Eyes: PERRLA, EOMI, no exophthalmos ENT: moist mucous membranes, no thyromegaly, no cervical lymphadenopathy Cardiovascular: RRR, No MRG Genitourinary system: He has bilaterally shrunk testicles, right testes 12 mL, left testes 15 mL. No scrotal mass, no varicocele,  no inguinal lymphadenopathy. Musculoskeletal: Large extremities patient explains this feature is familial, no deformities, strength intact in all 4 Skin: moist, warm, no rashes Neurological: no  tremor with outstretched hands.   Recent Results (from the past 2160 hour(s))  Hepatic Function Panel     Status: None   Collection Time: 08/28/17  8:30 AM  Result Value Ref Range   Total Protein 6.7 6.0 - 8.5 g/dL   Albumin 4.3 3.6 - 4.8 g/dL   Bilirubin Total 0.5 0.0 - 1.2 mg/dL   Bilirubin, Direct 0.12 0.00 - 0.40 mg/dL   Alkaline Phosphatase 77 39 - 117 IU/L   AST 20 0 - 40 IU/L   ALT 23 0 - 44 IU/L  CBC with Differential     Status: None   Collection Time: 08/28/17  8:30 AM  Result  Value Ref Range   WBC CANCELED x10E3/uL    Comment: [facility] was unable to collect sufficient specimen to perform the following test(s), and is providing the patient with re-collection instructions.  Result canceled by the ancillary.    RBC CANCELED     Comment: Test not performed  Result canceled by the ancillary.    Hemoglobin CANCELED     Comment: Test not performed  Result canceled by the ancillary.    Hematocrit CANCELED     Comment: Test not performed  Result canceled by the ancillary.    Platelets CANCELED     Comment: Test not performed  Result canceled by the ancillary.    Neutrophils CANCELED     Comment: Test not performed  Result canceled by the ancillary.    Lymphs CANCELED     Comment: Test not performed  Result canceled by the ancillary.    Monocytes CANCELED     Comment: Test not performed  Result canceled by the ancillary.    Eos CANCELED     Comment: Test not performed  Result canceled by the ancillary.    Lymphocytes Absolute CANCELED     Comment: Test not performed  Result canceled by the ancillary.    EOS (ABSOLUTE) CANCELED     Comment: Test not performed  Result canceled by the ancillary.    Basophils Absolute CANCELED     Comment: Test not performed  Result canceled by the ancillary.   Testosterone     Status: None   Collection Time: 08/28/17  8:30 AM  Result Value Ref Range   Testosterone 437 264 - 916 ng/dL    Comment: Adult  male reference interval is based on a population of healthy nonobese males (BMI <30) between 64 and 81 years old. [place], [place] [phone]. PMID: [id number].   Urinalysis, Complete     Status: Abnormal   Collection Time: 08/28/17 12:14 PM  Result Value Ref Range   Specific Gravity, UA 1.015 1.005 - 1.030   pH, UA 8.5 (H) 5.0 - 7.5   Color, UA Yellow Yellow   Appearance Ur Clear Clear   Leukocytes, UA Trace (A) Negative   Protein, UA Negative Negative/Trace   Glucose, UA Negative Negative   Ketones, UA Negative Negative   RBC, UA Negative Negative   Bilirubin, UA Negative Negative   Urobilinogen, Ur 0.2 0.2 - 1.0 mg/dL   Nitrite, UA Negative Negative   Microscopic Examination See below:   Microscopic Examination     Status: Abnormal   Collection Time: 08/28/17 12:14 PM  Result Value Ref Range   WBC, UA 6-10 (A) 0 - 5 /hpf   RBC, UA 0-2 0 - 2 /hpf   Epithelial Cells (non renal) None seen 0 - 10 /hpf   Renal Epithel, UA None seen None seen /hpf   Bacteria, UA None seen None seen/Few  Urine Culture     Status: Abnormal   Collection Time: 08/28/17  1:23 PM  Result Value Ref Range   Urine Culture, Routine Final report (A)    Organism ID, Bacteria Escherichia coli (A)     Comment: 50,000-100,000 colony forming units per mL   Antimicrobial Susceptibility Comment     Comment:       ** S = Susceptible; I = Intermediate; R = Resistant **                    P = Positive; N = Negative  MICS are expressed in micrograms per mL    Antibiotic                 RSLT#1    RSLT#2    RSLT#3    RSLT#4 Amoxicillin/Clavulanic Acid    R Ampicillin                     R Cefazolin                      S Cefepime                       S Ceftriaxone                    S Cefuroxime                     I Ciprofloxacin                  S Ertapenem                      S Gentamicin                     S Imipenem                       S Levofloxacin                    S Meropenem                      S Nitrofurantoin                 S Piperacillin/Tazobactam        S Tetracycline                   R Tobramycin                     S Trimethoprim/Sulfa             S      Assessment & Plan:   1. Hypogonadism  -Testosterone replacement helped improve his testosterone from 74 ->286 -> 782 -> 509 > 978 > 659-> 437  No adverse events noted. 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  -It is possible that it is multifactorial including exposure to heavy opioids, multiple back surgeries, prior inguinal surgery, or idiopathic.  -Prolactin level is favorable at 6.1, he would not need pituitary/sella imaging for now.  -He will benefit from continued replacement of testosterone. -I advised him to continue 100 mg of testosterone cypionate IM weekly for target testosterone level of 400-600 mcg/dL.  -He will return in in 4 months with total and free testosterone measurements.  -His PSA is at target.  He will have CBC as part of his next labs. -He is advised to pick up some more exercise.   - I advised patient to maintain close follow up with Dettinger, Fransisca Kaufmann, MD for primary care needs.  Follow up plan: Return in about 4 months (around 01/07/2018) for follow up with pre-visit labs.  Glade Lloyd, MD Phone: [phone]  Fax: [phone]   This note was partially dictated with voice recognition software. Similar sounding words can be transcribed inadequately or may not  be  corrected upon review.  09/07/2017, 1:30 PM

## 2017-09-08 ENCOUNTER — Other Ambulatory Visit: Payer: Self-pay | Admitting: Family Medicine

## 2017-09-22 ENCOUNTER — Other Ambulatory Visit: Payer: Self-pay | Admitting: Family Medicine

## 2017-09-29 ENCOUNTER — Other Ambulatory Visit: Payer: Self-pay | Admitting: *Deleted

## 2017-09-29 DIAGNOSIS — R0981 Nasal congestion: Secondary | ICD-10-CM

## 2017-09-29 MED ORDER — FLUTICASONE PROPIONATE 50 MCG/ACT NA SUSP: 1.0000 | Freq: Two times a day (BID) | NASAL | 6 refills | Status: DC | PRN

## 2017-10-10 DIAGNOSIS — G894 Chronic pain syndrome: Secondary | ICD-10-CM | POA: Diagnosis not present

## 2017-10-24 ENCOUNTER — Telehealth: Payer: Self-pay | Admitting: Family Medicine

## 2017-11-21 DIAGNOSIS — Z79899 Other long term (current) drug therapy: Secondary | ICD-10-CM | POA: Diagnosis not present

## 2017-11-21 DIAGNOSIS — G894 Chronic pain syndrome: Secondary | ICD-10-CM | POA: Diagnosis not present

## 2017-11-21 DIAGNOSIS — M542 Cervicalgia: Secondary | ICD-10-CM | POA: Diagnosis not present

## 2017-11-21 DIAGNOSIS — M961 Postlaminectomy syndrome, not elsewhere classified: Secondary | ICD-10-CM | POA: Diagnosis not present

## 2017-11-21 DIAGNOSIS — Z5181 Encounter for therapeutic drug level monitoring: Secondary | ICD-10-CM | POA: Diagnosis not present

## 2017-11-21 DIAGNOSIS — M5136 Other intervertebral disc degeneration, lumbar region: Secondary | ICD-10-CM | POA: Diagnosis not present

## 2017-11-24 ENCOUNTER — Ambulatory Visit (INDEPENDENT_AMBULATORY_CARE_PROVIDER_SITE_OTHER): Payer: Medicare Other | Admitting: Family Medicine

## 2017-11-24 ENCOUNTER — Encounter: Payer: Self-pay | Admitting: Family Medicine

## 2017-11-24 VITALS — BP 137/73 | HR 77 | Temp 97.6°F | Ht 73.0 in | Wt 259.0 lb

## 2017-11-24 DIAGNOSIS — R609 Edema, unspecified: Secondary | ICD-10-CM

## 2017-11-24 DIAGNOSIS — I10 Essential (primary) hypertension: Secondary | ICD-10-CM | POA: Diagnosis not present

## 2017-11-24 DIAGNOSIS — R600 Localized edema: Secondary | ICD-10-CM | POA: Diagnosis not present

## 2017-11-24 DIAGNOSIS — M25562 Pain in left knee: Secondary | ICD-10-CM

## 2017-11-24 MED ORDER — FUROSEMIDE 40 MG PO TABS: 40.0000 mg | ORAL_TABLET | ORAL | 3 refills | Status: DC

## 2017-11-24 NOTE — Progress Notes (Signed)
Chief Complaint  Patient presents with  . Knee Pain    Left  . Bilateral leg swelling    HPI  Patient presents today for recurrent knee pain. Left greater than right.  He felt a pop in the joint.  Behind the left knee.  This occurred during usual activities a few days ago.  It is getting worse and has led to swelling in the left knee.  He is also had some bruising.  He has chronic pain on both sides and has had injections performed in the past.  Would like to be considered for that today.  PMH: Smoking status noted ROS: Per HPI  Objective: BP 137/73   Pulse 77   Temp 97.6 F (36.4 C) (Oral)   Ht 6' 1" (1.854 m)   Wt 259 lb (117.5 kg)   BMI 34.17 kg/m  Gen: NAD, alert, cooperative with exam HEENT: NCAT, EOMI, PERRL CV: RRR, good S1/S2, no murmur Resp: CTABL, no wheezes, non-labored Abd: SNTND, BS present, no guarding or organomegaly Ext: 2+ edema bilaterally in the lower extremities primarily at the ankles but moderate at the left knee as well.  Neuro: Alert and oriented, No gross deficits  Assessment and plan:  1. Edema, unspecified type   2. Essential hypertension, benign   3. Acute pain of left knee     Meds ordered this encounter  Medications  . furosemide (LASIX) 40 MG tablet    Sig: Take 1 tablet (40 mg total) by mouth every morning.    Dispense:  30 tablet    Refill:  3    Orders Placed This Encounter  Procedures  . CMP14+EGFR  . Brain natriuretic peptide  I explained to him that I would like to hold off on the injection due to the pop and possibility of some injury.  Internal derangement is not obvious based on exam, but with his history and concerned about a meniscus but also with his history of current occurrence there is possibly rupture of plantaris.  Either way I am concerned this injection could delay healing.  Therefore he can consider an injection of the joint on follow-up with Dr. Dettinger.  That should be in 1 to 2 weeks.   Warren Stacks,  MD     

## 2017-11-25 LAB — CMP14+EGFR
A/G RATIO: 2 (ref 1.2–2.2)
ALBUMIN: 4.3 g/dL (ref 3.6–4.8)
ALT: 29 IU/L (ref 0–44)
AST: 30 IU/L (ref 0–40)
Alkaline Phosphatase: 75 IU/L (ref 39–117)
BUN / CREAT RATIO: 17 (ref 10–24)
BUN: 17 mg/dL (ref 8–27)
Bilirubin Total: 0.5 mg/dL (ref 0.0–1.2)
CALCIUM: 9.1 mg/dL (ref 8.6–10.2)
CHLORIDE: 99 mmol/L (ref 96–106)
CO2: 25 mmol/L (ref 20–29)
Creatinine, Ser: 0.99 mg/dL (ref 0.76–1.27)
GFR, EST AFRICAN AMERICAN: 93 mL/min/{1.73_m2} (ref >59)
GFR, EST NON AFRICAN AMERICAN: 80 mL/min/{1.73_m2} (ref >59)
Globulin, Total: 2.2 g/dL (ref 1.5–4.5)
Glucose: 82 mg/dL (ref 65–99)
POTASSIUM: 3.7 mmol/L (ref 3.5–5.2)
Sodium: 140 mmol/L (ref 134–144)
TOTAL PROTEIN: 6.5 g/dL (ref 6.0–8.5)

## 2017-11-26 ENCOUNTER — Encounter: Payer: Self-pay | Admitting: Family Medicine

## 2017-11-27 LAB — SPECIMEN STATUS REPORT

## 2017-11-28 LAB — BRAIN NATRIURETIC PEPTIDE: BNP: 26.4 pg/mL (ref 0.0–100.0)

## 2017-12-17 DIAGNOSIS — M25562 Pain in left knee: Secondary | ICD-10-CM | POA: Diagnosis not present

## 2017-12-18 ENCOUNTER — Telehealth: Payer: Self-pay

## 2017-12-18 DIAGNOSIS — M25562 Pain in left knee: Secondary | ICD-10-CM

## 2017-12-18 NOTE — Telephone Encounter (Signed)
Referral placed patient aware  

## 2017-12-18 NOTE — Addendum Note (Signed)
Addended by: Nigel Berthold C on: 12/18/2017 11:47 AM   Modules accepted: Orders

## 2017-12-18 NOTE — Telephone Encounter (Signed)
Saw Dr Livia Snellen for knee its no better   Wants a referral to Ortho

## 2017-12-18 NOTE — Telephone Encounter (Signed)
Go ahead and do referral to orthopedics, left knee pain diagnosis

## 2017-12-22 ENCOUNTER — Ambulatory Visit: Payer: Medicare Other | Admitting: Family Medicine

## 2017-12-29 ENCOUNTER — Other Ambulatory Visit: Payer: Self-pay | Admitting: Family Medicine

## 2018-01-03 DIAGNOSIS — Z978 Presence of other specified devices: Secondary | ICD-10-CM | POA: Diagnosis not present

## 2018-01-03 DIAGNOSIS — G894 Chronic pain syndrome: Secondary | ICD-10-CM | POA: Diagnosis not present

## 2018-01-08 ENCOUNTER — Ambulatory Visit: Payer: Medicare Other | Admitting: "Endocrinology

## 2018-01-16 ENCOUNTER — Other Ambulatory Visit: Payer: Self-pay | Admitting: "Endocrinology

## 2018-01-16 ENCOUNTER — Other Ambulatory Visit: Payer: Medicare Other

## 2018-01-16 DIAGNOSIS — E291 Testicular hypofunction: Secondary | ICD-10-CM

## 2018-01-16 DIAGNOSIS — S83242A Other tear of medial meniscus, current injury, left knee, initial encounter: Secondary | ICD-10-CM | POA: Diagnosis not present

## 2018-01-17 LAB — CBC WITH DIFFERENTIAL/PLATELET
Basophils Absolute: 0 10*3/uL (ref 0.0–0.2)
Basos: 1 %
EOS (ABSOLUTE): 0.2 10*3/uL (ref 0.0–0.4)
EOS: 3 %
Hematocrit: 42.5 % (ref 37.5–51.0)
Hemoglobin: 13.6 g/dL (ref 13.0–17.7)
IMMATURE GRANS (ABS): 0 10*3/uL (ref 0.0–0.1)
IMMATURE GRANULOCYTES: 0 %
Lymphocytes Absolute: 1.5 10*3/uL (ref 0.7–3.1)
Lymphs: 31 %
MCH: 28.1 pg (ref 26.6–33.0)
MCHC: 32 g/dL (ref 31.5–35.7)
MCV: 88 fL (ref 79–97)
MONOS ABS: 0.6 10*3/uL (ref 0.1–0.9)
Monocytes: 13 %
NEUTROS PCT: 52 %
Neutrophils Absolute: 2.6 10*3/uL (ref 1.4–7.0)
PLATELETS: 171 10*3/uL (ref 150–450)
RBC: 4.84 x10E6/uL (ref 4.14–5.80)
RDW: 12.6 % (ref 12.3–15.4)
WBC: 4.9 10*3/uL (ref 3.4–10.8)

## 2018-01-17 LAB — TESTOSTERONE: Testosterone: 494 ng/dL (ref 264–916)

## 2018-01-23 ENCOUNTER — Other Ambulatory Visit: Payer: Self-pay | Admitting: "Endocrinology

## 2018-01-25 ENCOUNTER — Ambulatory Visit (INDEPENDENT_AMBULATORY_CARE_PROVIDER_SITE_OTHER): Payer: Medicare Other | Admitting: "Endocrinology

## 2018-01-25 ENCOUNTER — Encounter: Payer: Self-pay | Admitting: "Endocrinology

## 2018-01-25 ENCOUNTER — Other Ambulatory Visit: Payer: Self-pay | Admitting: Sports Medicine

## 2018-01-25 VITALS — BP 145/81 | HR 62 | Ht 73.0 in | Wt 260.0 lb

## 2018-01-25 DIAGNOSIS — E291 Testicular hypofunction: Secondary | ICD-10-CM | POA: Diagnosis not present

## 2018-01-25 MED ORDER — "SYRINGE 21G X 1-1/4"" 3 ML MISC": 1.0000 | 0 refills | Status: DC

## 2018-01-25 MED ORDER — TESTOSTERONE CYPIONATE 200 MG/ML IM SOLN: 100.0000 mg | INTRAMUSCULAR | 0 refills | Status: DC

## 2018-01-25 NOTE — Progress Notes (Signed)
Endocrinology follow-up note  Subjective:    Patient ID: Jeff Stewart, male    DOB: September 03, 1955, PCP Dettinger, Jeff Kaufmann, MD   Past Medical History:  Diagnosis Date  . Anxiety   . Carpal tunnel syndrome, bilateral   . Cataract    removed years ago  . Chronic back pain    internal morphine pump  . DDD (degenerative disc disease)    neck, lumbar  . Depression   . Dyslipidemia    diet controlled  . Hypertension    borderline  . Sleep apnea    wears C-PAP   Past Surgical History:  Procedure Laterality Date  . BACK SURGERY     x 6  . CARPAL TUNNEL RELEASE     bilateral  . COLONOSCOPY    . ELBOW SURGERY    . INGUINAL HERNIA REPAIR Right   . KNEE ARTHROSCOPY    . NASAL SEPTUM SURGERY     x 6  . neck fusion     . NISSEN FUNDOPLICATION    . STOMACH SURGERY    . UPPER GASTROINTESTINAL ENDOSCOPY     Social History   Socioeconomic History  . Marital status: Married    Spouse name: Not on file  . Number of children: 3  . Years of education: Not on file  . Highest education level: Not on file  Occupational History  . 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  Scientific laboratory technician  . Financial resource strain: Not on file  . Food insecurity:    Worry: Not on file    Inability: Not on file  . Transportation needs:    Medical: Not on file    Non-medical: Not on file  Tobacco Use  . Smoking status: Never Smoker  . Smokeless tobacco: Never Used  Substance and Sexual Activity  . Alcohol use: No  . Drug use: No  . Sexual activity: Not Currently    Birth control/protection: Post-menopausal    Comment: married for 1972  Lifestyle  . Physical activity:    Days per week: Not on file    Minutes per session: Not on file  . Stress: Not on file  Relationships  . Social connections:    Talks on phone: Not on file    Gets together: Not on file    Attends religious service: Not on file    Active member of club or organization: Not on file    Attends meetings of  clubs or organizations: Not on file    Relationship status: Not on file  Other Topics Concern  . Not on file  Social History Narrative   Lives with wife    Outpatient Encounter Medications as of 01/25/2018  Medication Sig  . fluticasone (FLONASE) 50 MCG/ACT nasal spray Place 1 spray into both nostrils 2 (two) times daily as needed for allergies or rhinitis.  . furosemide (LASIX) 40 MG tablet Take 1 tablet (40 mg total) by mouth every morning.  Marland Kitchen losartan (COZAAR) 50 MG tablet Take 1 tablet (50 mg total) 2 (two) times daily by mouth. Take 50 mg 2 (two) times daily by mouth.  Marland Kitchen omeprazole (PRILOSEC) 20 MG capsule TAKE (1) CAPSULE TWICE DAILY (TAKE ON AN EMPTY STOMACH AT LEAST 30MIN- UTES BEFORE MEALS).  Marland Kitchen sertraline (ZOLOFT) 100 MG tablet Take 1 tablet (100 mg total) by mouth daily. (Needs to be seen before next refill)  . Syringe/Needle, Disp, (SYRINGE 3CC/21GX1-1/4") 21G X 1-1/4" 3 ML MISC 1 each by  Does not apply route once a week.  . testosterone cypionate (DEPOTESTOSTERONE CYPIONATE) 200 MG/ML injection Inject 0.5 mLs (100 mg total) into the muscle once a week.  . [DISCONTINUED] Syringe/Needle, Disp, (SYRINGE 3CC/21GX1-1/4") 21G X 1-1/4" 3 ML MISC 1 each by Does not apply route once a week.  . [DISCONTINUED] testosterone cypionate (DEPOTESTOSTERONE CYPIONATE) 200 MG/ML injection Inject 0.5 mLs (100 mg total) into the muscle once a week.   Facility-Administered Encounter Medications as of 01/25/2018  Medication  . 0.9 %  sodium chloride infusion   ALLERGIES: Allergies  Allergen Reactions  . Nubain [Nalbuphine Hcl] Rash   VACCINATION STATUS: Immunization History  Administered Date(s) Administered  . Influenza,inj,Quad PF,6+ Mos 01/14/2015, 02/02/2016  . Tdap 12/11/2014    HPI [name] is 62 year old gentleman with above medical history. He is here to follow-up for hypogonadism with repeat previsit labs.  -He ran out of his testosterone supplement 2 weeks ago.  He was supposed to  be on testosterone  100 mg IM weekly. He continued to feel better while taking his testosterone and wishes to continue on testosterone therapy.     -He feels fatigued this last few days . He was known to have low testosterone for at least since age 58.  He fathers 3 grown children. He denies history of head injury , has had nasal septal surgery multiple times. He denies injury to the testicles, exposure to chemotherapy, exposure to radiation to the genitals. However, due to chronic back injury and surgery he has exposure to heavy opioid therapy for pain control. He is currently on morphine pump. Overall on  opioids for pain control for the last 10-15 years.   Review of Systems Constitutional:  + Progressive weight gain,  no subjective hyperthermia/hypothermia Eyes: no blurry vision, no xerophthalmia ENT: no sore throat, no nodules palpated in throat, no dysphagia/odynophagia, no hoarseness  Musculoskeletal: He has diffuse large joint arthralgias including back pain, bilateral knee pain.   Skin: no rashes,  Genitourinary: Reports improved libido while taking testosterone Neurological: no tremors/numbness/tingling/dizziness Psychiatric: no depression/anxiety  Objective:    BP (!) 145/81   Pulse 62   Ht _0  (1.854 m)   Wt 260 lb (117.9 kg)   BMI 34.30 kg/m   Wt Readings from Last 3 Encounters:  01/25/18 260 lb (117.9 kg)  11/24/17 259 lb (117.5 kg)  09/07/17 253 lb (114.8 kg)    Physical Exam   Constitutional:  + Obese, + fatigue, not in acute distress.   Eyes: PERRLA, EOMI, no exophthalmos ENT: moist mucous membranes, no thyromegaly, no cervical lymphadenopathy Cardiovascular: RRR, No MRG Genitourinary system: He has bilaterally shrunk testicles, right testes 12 mL, left testes 15 mL. No scrotal mass, no varicocele,  no inguinal lymphadenopathy. Musculoskeletal: Large extremities patient explains this feature is familial, no deformities, strength intact in all 4 Skin: moist,  warm, no rashes Neurological: no tremor with outstretched hands.   Recent Results (from the past 2160 hour(s))  CMP14+EGFR     Status: None   Collection Time: 11/24/17  5:06 PM  Result Value Ref Range   Glucose 82 65 - 99 mg/dL   BUN 17 8 - 27 mg/dL   Creatinine, Ser 0.99 0.76 - 1.27 mg/dL   GFR calc non Af Amer 80 >59 mL/min/1.73   GFR calc Af Amer 93 >59 mL/min/1.73   BUN/Creatinine Ratio 17 10 - 24   Sodium 140 134 - 144 mmol/L   Potassium 3.7 3.5 - 5.2 mmol/L   Chloride  99 96 - 106 mmol/L   CO2 25 20 - 29 mmol/L   Calcium 9.1 8.6 - 10.2 mg/dL   Total Protein 6.5 6.0 - 8.5 g/dL   Albumin 4.3 3.6 - 4.8 g/dL   Globulin, Total 2.2 1.5 - 4.5 g/dL   Albumin/Globulin Ratio 2.0 1.2 - 2.2   Bilirubin Total 0.5 0.0 - 1.2 mg/dL   Alkaline Phosphatase 75 39 - 117 IU/L   AST 30 0 - 40 IU/L   ALT 29 0 - 44 IU/L  Brain natriuretic peptide     Status: None   Collection Time: 11/24/17  5:06 PM  Result Value Ref Range   BNP 26.4 0.0 - 100.0 pg/mL  Specimen status report     Status: None (Preliminary result)   Collection Time: 11/24/17  5:06 PM  Result Value Ref Range   specimen status report Comment     Comment: No Frozen Received  CBC with Differential     Status: None   Collection Time: 01/16/18  9:35 AM  Result Value Ref Range   WBC 4.9 3.4 - 10.8 x10E3/uL   RBC 4.84 4.14 - 5.80 x10E6/uL   Hemoglobin 13.6 13.0 - 17.7 g/dL   Hematocrit 42.5 37.5 - 51.0 %   MCV 88 79 - 97 fL   MCH 28.1 26.6 - 33.0 pg   MCHC 32.0 31.5 - 35.7 g/dL   RDW 12.6 12.3 - 15.4 %   Platelets 171 150 - 450 x10E3/uL   Neutrophils 52 Not Estab. %   Lymphs 31 Not Estab. %   Monocytes 13 Not Estab. %   Eos 3 Not Estab. %   Basos 1 Not Estab. %   Neutrophils Absolute 2.6 1.4 - 7.0 x10E3/uL   Lymphocytes Absolute 1.5 0.7 - 3.1 x10E3/uL   Monocytes Absolute 0.6 0.1 - 0.9 x10E3/uL   EOS (ABSOLUTE) 0.2 0.0 - 0.4 x10E3/uL   Basophils Absolute 0.0 0.0 - 0.2 x10E3/uL   Immature Granulocytes 0 Not Estab. %    Immature Grans (Abs) 0.0 0.0 - 0.1 x10E3/uL  Testosterone     Status: None   Collection Time: 01/16/18  9:35 AM  Result Value Ref Range   Testosterone 494 264 - 916 ng/dL    Comment: Adult male reference interval is based on a population of healthy nonobese males (BMI <30) between 58 and 2 years old. [place], [place] [phone]. PMID: [id number].      Assessment & Plan:   1. Hypogonadism  -Testosterone replacement helped improve his testosterone from 74 ->286 -> 782 -> 509 > 978 > 659-> 437 >494. -This last for 494 was before he ran out of his testosterone prescription.   No adverse events noted. 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  -It is possible that it is multifactorial including exposure to heavy opioids, multiple back surgeries, prior inguinal surgery, or idiopathic.  -Prolactin level is favorable at 6.1, he will not need pituitary/sella imaging for now.  -He has benefited from testosterone supplement and wishes to continue on testosterone treatment.  -He is advised to resume and continue testosterone 100 mg IM weekly for target testosterone level of 400-600 mcg/dL.  -He will return in in 4 months with total and free testosterone measurements.  -His PSA is at target.  He will have CBC as part of his next labs. -He is advised to pick up some more exercise. -  Suggestion is made for him to  avoid simple carbohydrates  from his diet including Cakes, Sweet Desserts / Pastries, Ice Cream, Soda (diet and regular), Sweet Tea, Candies, Chips, Cookies, Store Bought Juices, Alcohol in Excess of  1-2 drinks a day, Artificial Sweeteners, and "Sugar-free" Products. This will help patient to have stable blood glucose profile and potentially avoid unintended weight gain.  - I advised patient to maintain close follow up with Dettinger, Jeff Kaufmann, MD for primary care needs.  Follow up plan: Return in  about 4 months (around 05/26/2018) for Follow up with Pre-visit Labs.  Jeff Lloyd, MD Phone: [phone]  Fax: [phone]   This note was partially dictated with voice recognition software. Similar sounding words can be transcribed inadequately or may not  be corrected upon review.  01/25/2018, 5:32 PM

## 2018-01-26 ENCOUNTER — Other Ambulatory Visit: Payer: Self-pay | Admitting: Sports Medicine

## 2018-01-26 DIAGNOSIS — M25562 Pain in left knee: Secondary | ICD-10-CM

## 2018-01-29 ENCOUNTER — Inpatient Hospital Stay
Admission: RE | Admit: 2018-01-29 | Discharge: 2018-01-29 | Disposition: A | Discharge status: Home / Self Care | Payer: Medicare Other | Source: Ambulatory Visit | Attending: Sports Medicine | Admitting: Sports Medicine

## 2018-01-29 ENCOUNTER — Ambulatory Visit
Admission: RE | Admit: 2018-01-29 | Discharge: 2018-01-29 | Disposition: A | Discharge status: Home / Self Care | Payer: Medicare Other | Source: Ambulatory Visit | Attending: Sports Medicine | Admitting: Sports Medicine

## 2018-01-29 DIAGNOSIS — M25562 Pain in left knee: Secondary | ICD-10-CM

## 2018-01-29 DIAGNOSIS — S83242D Other tear of medial meniscus, current injury, left knee, subsequent encounter: Secondary | ICD-10-CM | POA: Diagnosis not present

## 2018-01-29 DIAGNOSIS — M25462 Effusion, left knee: Secondary | ICD-10-CM | POA: Diagnosis not present

## 2018-01-31 ENCOUNTER — Telehealth: Payer: Self-pay | Admitting: Family Medicine

## 2018-01-31 DIAGNOSIS — S83242D Other tear of medial meniscus, current injury, left knee, subsequent encounter: Secondary | ICD-10-CM | POA: Diagnosis not present

## 2018-01-31 NOTE — Telephone Encounter (Signed)
Left message for patient to call .  Please let us know what information you are requesting to send to Dr. Percell Miller.

## 2018-02-03 ENCOUNTER — Other Ambulatory Visit: Payer: Self-pay | Admitting: Family Medicine

## 2018-02-06 NOTE — Telephone Encounter (Signed)
Left message- ntbs 

## 2018-02-06 NOTE — Telephone Encounter (Signed)
Dettinger. NTBS 30 days given 12/29/17

## 2018-02-06 NOTE — Telephone Encounter (Signed)
No response from patient. Note filed. 

## 2018-02-07 ENCOUNTER — Encounter: Payer: Self-pay | Admitting: Family Medicine

## 2018-02-07 ENCOUNTER — Ambulatory Visit (INDEPENDENT_AMBULATORY_CARE_PROVIDER_SITE_OTHER): Payer: Medicare Other | Admitting: Family Medicine

## 2018-02-07 VITALS — BP 126/59 | HR 74 | Temp 98.2°F | Ht 73.0 in | Wt 257.4 lb

## 2018-02-07 DIAGNOSIS — F419 Anxiety disorder, unspecified: Secondary | ICD-10-CM

## 2018-02-07 DIAGNOSIS — I10 Essential (primary) hypertension: Secondary | ICD-10-CM | POA: Diagnosis not present

## 2018-02-07 DIAGNOSIS — Z23 Encounter for immunization: Secondary | ICD-10-CM | POA: Diagnosis not present

## 2018-02-07 DIAGNOSIS — F329 Major depressive disorder, single episode, unspecified: Secondary | ICD-10-CM

## 2018-02-07 DIAGNOSIS — K219 Gastro-esophageal reflux disease without esophagitis: Secondary | ICD-10-CM | POA: Diagnosis not present

## 2018-02-07 MED ORDER — LOSARTAN POTASSIUM 50 MG PO TABS: 50.0000 mg | ORAL_TABLET | Freq: Two times a day (BID) | ORAL | 3 refills | Status: DC

## 2018-02-07 MED ORDER — FUROSEMIDE 40 MG PO TABS: 40.0000 mg | ORAL_TABLET | ORAL | 3 refills | Status: DC

## 2018-02-07 MED ORDER — OMEPRAZOLE 20 MG PO CPDR: 20.0000 mg | DELAYED_RELEASE_CAPSULE | Freq: Two times a day (BID) | ORAL | 3 refills | Status: DC

## 2018-02-07 MED ORDER — SERTRALINE HCL 100 MG PO TABS: 100.0000 mg | ORAL_TABLET | Freq: Every day | ORAL | 3 refills | Status: DC

## 2018-02-07 NOTE — Progress Notes (Signed)
BP (!) 126/59   Pulse 74   Temp 98.2 F (36.8 C) (Oral)   Ht '6\' 1"'$  (1.854 m)   Wt 257 lb 6.4 oz (116.8 kg)   BMI 33.96 kg/m    Subjective:    Patient ID: Jeff Stewart, male    DOB: 07-16-1955, 62 y.o.   MRN: [id number]  HPI: Jeff Stewart is a 62 y.o. male presenting on 02/07/2018 for Depression (3 month follow up)   HPI Depression recheck  Patient is coming in today for depression recheck.  He says that he has been doing well with the depression medication when he has it.  He says he did run out for couple days and has been feeling difference but would like to continue going on it.  He says that everything has been doing well except for his knees and he is seen the orthopedic and talking about surgery for that.  GERD Patient is currently on omeprazole.  She denies any major symptoms or abdominal pain or belching or burping. She denies any blood in her stool or lightheadedness or dizziness.  Hypertension Patient is currently on losartan, and their blood pressure today is 126/59. Patient denies any lightheadedness or dizziness. Patient denies headaches, blurred vision, chest pains, shortness of breath, or weakness. Denies any side effects from medication and is content with current medication.   Relevant past medical, surgical, family and social history reviewed and updated as indicated. Interim medical history since our last visit reviewed. Allergies and medications reviewed and updated.  Review of Systems  Constitutional: Negative for chills and fever.  Eyes: Negative for visual disturbance.  Respiratory: Negative for shortness of breath and wheezing.   Cardiovascular: Negative for chest pain, palpitations and leg swelling.  Musculoskeletal: Negative for gait problem.  Skin: Negative for rash.  Neurological: Negative for dizziness, weakness, light-headedness and headaches.  Psychiatric/Behavioral: Negative for decreased concentration, dysphoric mood, self-injury, sleep  disturbance and suicidal ideas. The patient is not nervous/anxious.   All other systems reviewed and are negative.   Per HPI unless specifically indicated above   Allergies as of 02/07/2018      Reactions   Nubain [nalbuphine Hcl] Rash      Medication List        Accurate as of 02/07/18  4:24 PM. Always use your most recent med list.          fluticasone 50 MCG/ACT nasal spray Commonly known as:  FLONASE Place 1 spray into both nostrils 2 (two) times daily as needed for allergies or rhinitis.   furosemide 40 MG tablet Commonly known as:  LASIX Take 1 tablet (40 mg total) by mouth every morning.   losartan 50 MG tablet Commonly known as:  COZAAR Take 1 tablet (50 mg total) by mouth 2 (two) times daily. Take 50 mg 2 (two) times daily by mouth.   omeprazole 20 MG capsule Commonly known as:  PRILOSEC Take 1 capsule (20 mg total) by mouth 2 (two) times daily before a meal.   sertraline 100 MG tablet Commonly known as:  ZOLOFT Take 1 tablet (100 mg total) by mouth daily. (Needs to be seen before next refill)   SYRINGE 3CC/21GX1-1/4" 21G X 1-1/4" 3 ML Misc 1 each by Does not apply route once a week.   testosterone cypionate 200 MG/ML injection Commonly known as:  DEPOTESTOSTERONE CYPIONATE Inject 0.5 mLs (100 mg total) into the muscle once a week.          Objective:  BP (!) 126/59   Pulse 74   Temp 98.2 F (36.8 C) (Oral)   Ht '6\' 1"'$  (1.854 m)   Wt 257 lb 6.4 oz (116.8 kg)   BMI 33.96 kg/m   Wt Readings from Last 3 Encounters:  02/07/18 257 lb 6.4 oz (116.8 kg)  01/25/18 260 lb (117.9 kg)  11/24/17 259 lb (117.5 kg)    Physical Exam  Constitutional: He is oriented to person, place, and time. He appears well-developed and well-nourished. No distress.  Eyes: Conjunctivae are normal. No scleral icterus.  Neck: Neck supple. No thyromegaly present.  Cardiovascular: Normal rate, regular rhythm, normal heart sounds and intact distal pulses.  No murmur  heard. Pulmonary/Chest: Effort normal and breath sounds normal. No respiratory distress. He has no wheezes.  Musculoskeletal: Normal range of motion. He exhibits no edema.  Lymphadenopathy:    He has no cervical adenopathy.  Neurological: He is alert and oriented to person, place, and time. Coordination normal.  Skin: Skin is warm and dry. No rash noted. He is not diaphoretic.  Psychiatric: He has a normal mood and affect. His behavior is normal.  Nursing note and vitals reviewed.       Assessment & Plan:   Problem List Items Addressed This Visit      Cardiovascular and Mediastinum   Essential hypertension, benign - Primary   Relevant Medications   furosemide (LASIX) 40 MG tablet   losartan (COZAAR) 50 MG tablet   Other Relevant Orders   BMP8+EGFR     Digestive   GERD (gastroesophageal reflux disease)   Relevant Medications   omeprazole (PRILOSEC) 20 MG capsule     Other   Anxiety and depression   Relevant Medications   sertraline (ZOLOFT) 100 MG tablet    Continue current medication, will check can panel because patient has been using furosemide for swelling.  Follow up plan: Return in about 6 months (around 08/08/2018), or if symptoms worsen or fail to improve, for Recheck hypertension and anxiety and GERD.  Counseling provided for all of the vaccine components Orders Placed This Encounter  Procedures  . BMP8+EGFR    Caryl Pina, MD [facility] 02/07/2018, 4:24 PM

## 2018-02-08 DIAGNOSIS — M7041 Prepatellar bursitis, right knee: Secondary | ICD-10-CM | POA: Diagnosis not present

## 2018-02-08 DIAGNOSIS — M7040 Prepatellar bursitis, unspecified knee: Secondary | ICD-10-CM | POA: Diagnosis not present

## 2018-02-08 DIAGNOSIS — F329 Major depressive disorder, single episode, unspecified: Secondary | ICD-10-CM | POA: Diagnosis not present

## 2018-02-08 DIAGNOSIS — K219 Gastro-esophageal reflux disease without esophagitis: Secondary | ICD-10-CM | POA: Diagnosis not present

## 2018-02-08 DIAGNOSIS — M1712 Unilateral primary osteoarthritis, left knee: Secondary | ICD-10-CM | POA: Diagnosis not present

## 2018-02-08 DIAGNOSIS — F419 Anxiety disorder, unspecified: Secondary | ICD-10-CM | POA: Diagnosis not present

## 2018-02-08 DIAGNOSIS — I10 Essential (primary) hypertension: Secondary | ICD-10-CM | POA: Diagnosis not present

## 2018-02-08 DIAGNOSIS — Z23 Encounter for immunization: Secondary | ICD-10-CM | POA: Diagnosis not present

## 2018-02-08 LAB — BMP8+EGFR
BUN/Creatinine Ratio: 17 (ref 10–24)
BUN: 18 mg/dL (ref 8–27)
CHLORIDE: 101 mmol/L (ref 96–106)
CO2: 27 mmol/L (ref 20–29)
CREATININE: 1.05 mg/dL (ref 0.76–1.27)
Calcium: 9.2 mg/dL (ref 8.6–10.2)
GFR calc Af Amer: 86 mL/min/{1.73_m2} (ref >59)
GFR calc non Af Amer: 75 mL/min/{1.73_m2} (ref >59)
GLUCOSE: 87 mg/dL (ref 65–99)
POTASSIUM: 3.9 mmol/L (ref 3.5–5.2)
SODIUM: 141 mmol/L (ref 134–144)

## 2018-02-12 DIAGNOSIS — M961 Postlaminectomy syndrome, not elsewhere classified: Secondary | ICD-10-CM | POA: Diagnosis not present

## 2018-02-12 DIAGNOSIS — M5136 Other intervertebral disc degeneration, lumbar region: Secondary | ICD-10-CM | POA: Diagnosis not present

## 2018-02-12 DIAGNOSIS — Z978 Presence of other specified devices: Secondary | ICD-10-CM | POA: Diagnosis not present

## 2018-02-13 DIAGNOSIS — M1712 Unilateral primary osteoarthritis, left knee: Secondary | ICD-10-CM | POA: Diagnosis not present

## 2018-02-13 DIAGNOSIS — M7041 Prepatellar bursitis, right knee: Secondary | ICD-10-CM | POA: Diagnosis not present

## 2018-02-19 DIAGNOSIS — M1712 Unilateral primary osteoarthritis, left knee: Secondary | ICD-10-CM | POA: Diagnosis not present

## 2018-02-26 DIAGNOSIS — M1712 Unilateral primary osteoarthritis, left knee: Secondary | ICD-10-CM | POA: Diagnosis not present

## 2018-02-26 DIAGNOSIS — M79641 Pain in right hand: Secondary | ICD-10-CM | POA: Diagnosis not present

## 2018-03-26 DIAGNOSIS — M961 Postlaminectomy syndrome, not elsewhere classified: Secondary | ICD-10-CM | POA: Diagnosis not present

## 2018-03-26 DIAGNOSIS — M5136 Other intervertebral disc degeneration, lumbar region: Secondary | ICD-10-CM | POA: Diagnosis not present

## 2018-03-26 DIAGNOSIS — G894 Chronic pain syndrome: Secondary | ICD-10-CM | POA: Diagnosis not present

## 2018-03-26 DIAGNOSIS — M5030 Other cervical disc degeneration, unspecified cervical region: Secondary | ICD-10-CM | POA: Diagnosis not present

## 2018-03-29 ENCOUNTER — Ambulatory Visit (INDEPENDENT_AMBULATORY_CARE_PROVIDER_SITE_OTHER): Payer: Medicare Other | Admitting: Family Medicine

## 2018-03-29 ENCOUNTER — Encounter: Payer: Self-pay | Admitting: Family Medicine

## 2018-03-29 VITALS — BP 124/74 | HR 65 | Temp 97.1°F | Ht 73.0 in | Wt 260.4 lb

## 2018-03-29 DIAGNOSIS — Z01818 Encounter for other preprocedural examination: Secondary | ICD-10-CM | POA: Diagnosis not present

## 2018-03-29 DIAGNOSIS — Z136 Encounter for screening for cardiovascular disorders: Secondary | ICD-10-CM | POA: Diagnosis not present

## 2018-03-29 DIAGNOSIS — Z13220 Encounter for screening for lipoid disorders: Secondary | ICD-10-CM

## 2018-03-29 NOTE — Progress Notes (Signed)
BP 124/74   Pulse 65   Temp (!) 97.1 F (36.2 C) (Oral)   Ht '6\' 1"'$  (1.854 m)   Wt 260 lb 6.4 oz (118.1 kg)   BMI 34.36 kg/m    Subjective:    Patient ID: Jeff Stewart, male    DOB: 10/17/1955, 63 y.o.   MRN: [id number]  HPI: DEOVION BATREZ is a 63 y.o. male presenting on 03/29/2018 for surgical clearance (Left knee replacement )   HPI Preoperative clearance physical exam Patient is going in for a left knee replacement on 04/24/2018 and is coming in today for preoperative physical exam.  Besides his left knee hurting and limiting his ability to walk and move as much he denies any other health issues or concerns.  He denies any chest pain or palpitations or shortness of breath or wheezing. Patient denies any chest pain, shortness of breath, headaches or vision issues, abdominal complaints, diarrhea, nausea, vomiting, or joint issues.   Relevant past medical, surgical, family and social history reviewed and updated as indicated. Interim medical history since our last visit reviewed. Allergies and medications reviewed and updated.  Review of Systems  Constitutional: Negative for chills and fever.  HENT: Negative for ear pain and tinnitus.   Respiratory: Negative for cough, shortness of breath and wheezing.   Cardiovascular: Negative for chest pain, palpitations and leg swelling.  Gastrointestinal: Negative for abdominal pain, blood in stool, constipation and diarrhea.  Genitourinary: Negative for dysuria and hematuria.  Musculoskeletal: Negative for back pain and myalgias.  Skin: Negative for rash.  Neurological: Negative for dizziness, weakness and headaches.  Psychiatric/Behavioral: Negative for suicidal ideas.    Per HPI unless specifically indicated above   Allergies as of 03/29/2018      Reactions   Nubain [nalbuphine Hcl] Rash      Medication List       Accurate as of March 29, 2018 10:27 AM. Always use your most recent med list.        fluticasone 50 MCG/ACT  nasal spray Commonly known as:  FLONASE Place 1 spray into both nostrils 2 (two) times daily as needed for allergies or rhinitis.   furosemide 40 MG tablet Commonly known as:  LASIX Take 1 tablet (40 mg total) by mouth every morning.   losartan 50 MG tablet Commonly known as:  COZAAR Take 1 tablet (50 mg total) by mouth 2 (two) times daily. Take 50 mg 2 (two) times daily by mouth.   omeprazole 20 MG capsule Commonly known as:  PRILOSEC Take 1 capsule (20 mg total) by mouth 2 (two) times daily before a meal.   sertraline 100 MG tablet Commonly known as:  ZOLOFT Take 1 tablet (100 mg total) by mouth daily. (Needs to be seen before next refill)   SYRINGE 3CC/21GX1-1/4" 21G X 1-1/4" 3 ML Misc 1 each by Does not apply route once a week.   testosterone cypionate 200 MG/ML injection Commonly known as:  DEPOTESTOSTERONE CYPIONATE Inject 0.5 mLs (100 mg total) into the muscle once a week.          Objective:    BP 124/74   Pulse 65   Temp (!) 97.1 F (36.2 C) (Oral)   Ht '6\' 1"'$  (1.854 m)   Wt 260 lb 6.4 oz (118.1 kg)   BMI 34.36 kg/m   Wt Readings from Last 3 Encounters:  03/29/18 260 lb 6.4 oz (118.1 kg)  02/07/18 257 lb 6.4 oz (116.8 kg)  01/25/18 260 lb (117.9  kg)    Physical Exam Vitals signs and nursing note reviewed.  Constitutional:      General: He is not in acute distress.    Appearance: He is well-developed. He is not diaphoretic.  Eyes:     General: No scleral icterus.    Conjunctiva/sclera: Conjunctivae normal.  Neck:     Musculoskeletal: Neck supple.     Thyroid: No thyromegaly.  Cardiovascular:     Rate and Rhythm: Normal rate and regular rhythm.     Heart sounds: Normal heart sounds. No murmur.  Pulmonary:     Effort: Pulmonary effort is normal. No respiratory distress.     Breath sounds: Normal breath sounds. No wheezing.  Musculoskeletal: Normal range of motion.  Lymphadenopathy:     Cervical: No cervical adenopathy.  Skin:    General: Skin is  warm and dry.     Findings: No rash.  Neurological:     Mental Status: He is alert and oriented to person, place, and time.     Coordination: Coordination normal.  Psychiatric:        Behavior: Behavior normal.     EKG: Sinus rhythm    Assessment & Plan:   Problem List Items Addressed This Visit    None    Visit Diagnoses    Pre-operative clearance    -  Primary   Relevant Orders   EKG 12-Lead (Completed)   CBC with Differential/Platelet   CMP14+EGFR   Lipid panel   Lipid screening       Relevant Orders   Lipid panel      Patient has a normal EKG and will be cleared for surgery if blood work comes back normal, will sign the paperwork and send it on. Follow up plan: Return if symptoms worsen or fail to improve.  Counseling provided for all of the vaccine components Orders Placed This Encounter  Procedures  . CBC with Differential/Platelet  . CMP14+EGFR  . Lipid panel  . EKG 12-Lead    Caryl Pina, MD [facility] 03/29/2018, 10:27 AM

## 2018-03-30 LAB — CMP14+EGFR
ALBUMIN: 4.5 g/dL (ref 3.6–4.8)
ALK PHOS: 96 IU/L (ref 39–117)
ALT: 26 IU/L (ref 0–44)
AST: 24 IU/L (ref 0–40)
Albumin/Globulin Ratio: 2 (ref 1.2–2.2)
BUN / CREAT RATIO: 18 (ref 10–24)
BUN: 17 mg/dL (ref 8–27)
Bilirubin Total: 0.4 mg/dL (ref 0.0–1.2)
CALCIUM: 9.2 mg/dL (ref 8.6–10.2)
CO2: 26 mmol/L (ref 20–29)
CREATININE: 0.96 mg/dL (ref 0.76–1.27)
Chloride: 97 mmol/L (ref 96–106)
GFR, EST AFRICAN AMERICAN: 96 mL/min/{1.73_m2} (ref >59)
GFR, EST NON AFRICAN AMERICAN: 83 mL/min/{1.73_m2} (ref >59)
GLOBULIN, TOTAL: 2.2 g/dL (ref 1.5–4.5)
GLUCOSE: 72 mg/dL (ref 65–99)
Potassium: 3.7 mmol/L (ref 3.5–5.2)
SODIUM: 138 mmol/L (ref 134–144)
TOTAL PROTEIN: 6.7 g/dL (ref 6.0–8.5)

## 2018-03-30 LAB — CBC WITH DIFFERENTIAL/PLATELET
BASOS ABS: 0 10*3/uL (ref 0.0–0.2)
Basos: 1 %
EOS (ABSOLUTE): 0.1 10*3/uL (ref 0.0–0.4)
Eos: 2 %
HEMOGLOBIN: 14.5 g/dL (ref 13.0–17.7)
Hematocrit: 43.8 % (ref 37.5–51.0)
Immature Grans (Abs): 0 10*3/uL (ref 0.0–0.1)
Immature Granulocytes: 0 %
LYMPHS ABS: 1.2 10*3/uL (ref 0.7–3.1)
Lymphs: 29 %
MCH: 28.8 pg (ref 26.6–33.0)
MCHC: 33.1 g/dL (ref 31.5–35.7)
MCV: 87 fL (ref 79–97)
MONOCYTES: 11 %
Monocytes Absolute: 0.5 10*3/uL (ref 0.1–0.9)
NEUTROS ABS: 2.5 10*3/uL (ref 1.4–7.0)
Neutrophils: 57 %
Platelets: 172 10*3/uL (ref 150–450)
RBC: 5.04 x10E6/uL (ref 4.14–5.80)
RDW: 13.3 % (ref 11.6–15.4)
WBC: 4.3 10*3/uL (ref 3.4–10.8)

## 2018-03-30 LAB — LIPID PANEL
CHOL/HDL RATIO: 3.6 ratio (ref 0.0–5.0)
CHOLESTEROL TOTAL: 175 mg/dL (ref 100–199)
HDL: 49 mg/dL (ref >39)
LDL CALC: 103 mg/dL — AB (ref 0–99)
Triglycerides: 114 mg/dL (ref 0–149)
VLDL CHOLESTEROL CAL: 23 mg/dL (ref 5–40)

## 2018-04-02 DIAGNOSIS — M1712 Unilateral primary osteoarthritis, left knee: Secondary | ICD-10-CM | POA: Diagnosis not present

## 2018-04-02 DIAGNOSIS — G4733 Obstructive sleep apnea (adult) (pediatric): Secondary | ICD-10-CM | POA: Diagnosis present

## 2018-04-02 NOTE — H&P (Addendum)
KNEE ARTHROPLASTY ADMISSION H&P  Patient ID: Jeff Stewart MRN: [id number] DOB/AGE: 10/02/1955 63 y.o.  Chief Complaint: left knee pain.  Planned Procedure Date: 04/24/2018 Medical Clearance by Dr. Warrick Parisian pending. Additional clearance by PM&R: Dr. Darral Dash  HPI: Jeff Stewart is a 63 y.o. male with a history of OSA, multiple back surgeries, spinal cord stimulator/morphine pump, and GERD who presents for evaluation of OA LEFT KNEE. The patient has a history of pain and functional disability in the left knee due to arthritis and has failed non-surgical conservative treatments for greater than 12 weeks to include NSAID's and/or analgesics, corticosteriod injections and activity modification.  Onset of symptoms was gradual, starting 1 years ago with gradually worsening course since that time.  Patient currently rates pain at 8 out of 10 with activity. Patient has night pain, worsening of pain with activity and weight bearing and pain that interferes with activities of daily living.  Patient has evidence of periarticular osteophytes and joint space narrowing on x-ray.  MRI shows medial and lateral meniscus degeneration, lateral meniscus tear, subchondral edema.  Past Medical History:  Diagnosis Date  . Anxiety   . Carpal tunnel syndrome, bilateral   . Cataract    removed years ago  . Chronic back pain    internal morphine pump  . DDD (degenerative disc disease)    neck, lumbar  . Depression   . Dyslipidemia    diet controlled  . Hypertension    borderline  . Sleep apnea    wears C-PAP   Past Surgical History:  Procedure Laterality Date  . BACK SURGERY     x 6  . CARPAL TUNNEL RELEASE     bilateral  . COLONOSCOPY    . ELBOW SURGERY    . INGUINAL HERNIA REPAIR Right   . KNEE ARTHROSCOPY    . NASAL SEPTUM SURGERY     x 6  . neck fusion     . NISSEN FUNDOPLICATION    . STOMACH SURGERY    . UPPER GASTROINTESTINAL ENDOSCOPY     Allergies  Allergen Reactions  . Nubain  [Nalbuphine Hcl] Rash   Prior to Admission medications   Medication Sig Start Date End Date Taking? Authorizing Provider  fluticasone (FLONASE) 50 MCG/ACT nasal spray Place 1 spray into both nostrils 2 (two) times daily as needed for allergies or rhinitis. 09/29/17   Dettinger, Fransisca Kaufmann, MD  furosemide (LASIX) 40 MG tablet Take 1 tablet (40 mg total) by mouth every morning. 02/07/18   Dettinger, Fransisca Kaufmann, MD  losartan (COZAAR) 50 MG tablet Take 1 tablet (50 mg total) by mouth 2 (two) times daily. Take 50 mg 2 (two) times daily by mouth. 02/07/18   Dettinger, Fransisca Kaufmann, MD  omeprazole (PRILOSEC) 20 MG capsule Take 1 capsule (20 mg total) by mouth 2 (two) times daily before a meal. 02/07/18   Dettinger, Fransisca Kaufmann, MD  sertraline (ZOLOFT) 100 MG tablet Take 1 tablet (100 mg total) by mouth daily. (Needs to be seen before next refill) 02/07/18   Dettinger, Fransisca Kaufmann, MD  Syringe/Needle, Disp, (SYRINGE 3CC/21GX1-1/4") 21G X 1-1/4" 3 ML MISC 1 each by Does not apply route once a week. 01/25/18   Cassandria Anger, MD  testosterone cypionate (DEPOTESTOSTERONE CYPIONATE) 200 MG/ML injection Inject 0.5 mLs (100 mg total) into the muscle once a week. 01/25/18   Cassandria Anger, MD   Social History   Socioeconomic History  . Marital status: Married    Spouse name: Not  on file  . Number of children: 3  . Years of education: Not on file  . Highest education level: Not on file  Occupational History  . 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  Scientific laboratory technician  . Financial resource strain: Not on file  . Food insecurity:    Worry: Not on file    Inability: Not on file  . Transportation needs:    Medical: Not on file    Non-medical: Not on file  Tobacco Use  . Smoking status: Never Smoker  . Smokeless tobacco: Never Used  Substance and Sexual Activity  . Alcohol use: No  . Drug use: No  . Sexual activity: Not Currently    Birth control/protection: Post-menopausal    Comment: married  for 1972  Lifestyle  . Physical activity:    Days per week: Not on file    Minutes per session: Not on file  . Stress: Not on file  Relationships  . Social connections:    Talks on phone: Not on file    Gets together: Not on file    Attends religious service: Not on file    Active member of club or organization: Not on file    Attends meetings of clubs or organizations: Not on file    Relationship status: Not on file  Other Topics Concern  . Not on file  Social History Narrative   Lives with wife    Family History  Problem Relation Age of Onset  . CAD Father 52  . Emphysema Father   . Stroke Mother 37  . CAD Brother 40       CABG  . Alcohol abuse Brother   . Hypertension Brother   . Stroke Brother   . CAD Brother   . Cirrhosis Brother   . Alcohol abuse Brother   . Hypertension Brother   . CAD Sister   . Hypertension Sister   . Hyperlipidemia Sister   . Cancer Sister        melanoma  . Cancer Sister   . Cancer Brother   . Cancer Brother     ROS: Currently denies lightheadedness, dizziness, Fever, chills, CP, SOB. No personal history of DVT, PE, MI, or CVA. No loose teeth or dentures All other systems have been reviewed and were otherwise currently negative with the exception of those mentioned in the HPI and as above.  Objective: Vitals: Ht: 6 feet 0 inches wt: 257 temp: 97.8 BP: 143/93 pulse: 65 O2 97 % on room air.   Physical Exam: General: Alert, NAD.  Antalgic Gait  HEENT: EOMI, Good Neck Extension  Pulm: No increased work of breathing.  Clear B/L A/P w/o crackle or wheeze.  CV: RRR, No m/g/r appreciated  GI: soft, NT, ND Neuro: Neuro without gross focal deficit.  Sensation intact distally Skin: No lesions in the area of chief complaint MSK/Surgical Site: Left knee w/o redness or effusion.  + JLT. ROM 5-110.  5/5 strength in extension and flexion.  +EHL/FHL.  NVI.  Stable varus and valgus stress.    Imaging Review Imaging demonstrates severe  degenerative joint disease of the left knee.   Preoperative templating of the joint replacement has been completed, documented, and submitted to the Operating Room personnel in order to optimize intra-operative equipment management.  Assessment: OA LEFT KNEE Principal Problem:   Primary osteoarthritis of left knee Active Problems:   Anxiety and depression   Essential hypertension, benign   GERD (gastroesophageal reflux disease)  OSA (obstructive sleep apnea)   Plan: Plan for Procedure(s): TOTAL KNEE ARTHROPLASTY  The patient history, physical exam, clinical judgement of the provider and imaging are consistent with end stage degenerative joint disease and total joint arthroplasty is deemed medically necessary. The treatment options including medical management, injection therapy, and arthroplasty were discussed at length. The risks and benefits of Procedure(s): TOTAL KNEE ARTHROPLASTY were presented and reviewed.  The risks of nonoperative treatment, versus surgical intervention including but not limited to continued pain, aseptic loosening, stiffness, dislocation/subluxation, infection, bleeding, nerve injury, blood clots, cardiopulmonary complications, morbidity, mortality, among others were discussed. The patient verbalizes understanding and wishes to proceed with the plan.  Patient is being admitted for inpatient treatment for surgery, pain control, PT, OT, prophylactic antibiotics, VTE prophylaxis, progressive ambulation, ADL's and discharge planning.   Dental prophylaxis discussed and recommended for 2 years postoperatively.   The patient does meet the criteria for TXA which will be used perioperatively.    ASA 81 mg BID will be used postoperatively for DVT prophylaxis in addition to SCDs, and early ambulation.  Order CPAP for OSA  Plan for oxycodone, gabapentin, celebrex for pain.  Continue Omeprazole for gastric protection.  The patient is planning to be discharged home  with home health services ([facility]) in care of his wife.   Patient's anticipated LOS is less than 2 midnights, meeting these requirements: - Younger than 29 - Lives within 1 hour of care - Has a competent adult at home to recover with post-op recover - NO history of  - Diabetes  - Coronary Artery Disease  - Heart failure  - Heart attack  - Stroke  - DVT/VTE  - Cardiac arrhythmia  - Respiratory Failure/COPD  - Renal failure  - Anemia  - Advanced Liver disease    Prudencio Burly III, PA-C 04/02/2018 5:51 PM

## 2018-04-07 ENCOUNTER — Other Ambulatory Visit: Payer: Self-pay | Admitting: Family Medicine

## 2018-04-17 ENCOUNTER — Encounter (HOSPITAL_COMMUNITY): Payer: Self-pay

## 2018-04-17 ENCOUNTER — Other Ambulatory Visit: Payer: Self-pay

## 2018-04-17 ENCOUNTER — Encounter (HOSPITAL_COMMUNITY)
Admission: RE | Admit: 2018-04-17 | Discharge: 2018-04-17 | Disposition: A | Discharge status: Home / Self Care | Payer: Medicare Other | Source: Ambulatory Visit | Attending: Orthopedic Surgery | Admitting: Orthopedic Surgery

## 2018-04-17 DIAGNOSIS — Z79899 Other long term (current) drug therapy: Secondary | ICD-10-CM | POA: Diagnosis not present

## 2018-04-17 DIAGNOSIS — I10 Essential (primary) hypertension: Secondary | ICD-10-CM | POA: Insufficient documentation

## 2018-04-17 DIAGNOSIS — M1712 Unilateral primary osteoarthritis, left knee: Secondary | ICD-10-CM | POA: Diagnosis not present

## 2018-04-17 DIAGNOSIS — G8929 Other chronic pain: Secondary | ICD-10-CM | POA: Insufficient documentation

## 2018-04-17 DIAGNOSIS — F329 Major depressive disorder, single episode, unspecified: Secondary | ICD-10-CM | POA: Insufficient documentation

## 2018-04-17 DIAGNOSIS — Z7951 Long term (current) use of inhaled steroids: Secondary | ICD-10-CM | POA: Insufficient documentation

## 2018-04-17 DIAGNOSIS — G4733 Obstructive sleep apnea (adult) (pediatric): Secondary | ICD-10-CM | POA: Diagnosis not present

## 2018-04-17 DIAGNOSIS — Z981 Arthrodesis status: Secondary | ICD-10-CM | POA: Diagnosis not present

## 2018-04-17 DIAGNOSIS — F419 Anxiety disorder, unspecified: Secondary | ICD-10-CM | POA: Insufficient documentation

## 2018-04-17 DIAGNOSIS — M549 Dorsalgia, unspecified: Secondary | ICD-10-CM | POA: Insufficient documentation

## 2018-04-17 DIAGNOSIS — Z9689 Presence of other specified functional implants: Secondary | ICD-10-CM | POA: Diagnosis not present

## 2018-04-17 DIAGNOSIS — E785 Hyperlipidemia, unspecified: Secondary | ICD-10-CM | POA: Insufficient documentation

## 2018-04-17 DIAGNOSIS — Z01818 Encounter for other preprocedural examination: Secondary | ICD-10-CM | POA: Insufficient documentation

## 2018-04-17 HISTORY — DX: Presence of other specified functional implants: Z96.89

## 2018-04-17 HISTORY — DX: Other specified postprocedural states: Z98.890

## 2018-04-17 LAB — BASIC METABOLIC PANEL
Anion gap: 8 (ref 5–15)
BUN: 22 mg/dL (ref 8–23)
CHLORIDE: 102 mmol/L (ref 98–111)
CO2: 28 mmol/L (ref 22–32)
Calcium: 9 mg/dL (ref 8.9–10.3)
Creatinine, Ser: 0.95 mg/dL (ref 0.61–1.24)
GFR calc Af Amer: >60 mL/min (ref >60)
GFR calc non Af Amer: >60 mL/min (ref >60)
Glucose, Bld: 82 mg/dL (ref 70–99)
Potassium: 4.1 mmol/L (ref 3.5–5.1)
Sodium: 138 mmol/L (ref 135–145)

## 2018-04-17 LAB — CBC
HEMATOCRIT: 42.9 % (ref 39.0–52.0)
Hemoglobin: 13.7 g/dL (ref 13.0–17.0)
MCH: 29.7 pg (ref 26.0–34.0)
MCHC: 31.9 g/dL (ref 30.0–36.0)
MCV: 92.9 fL (ref 80.0–100.0)
Platelets: 128 10*3/uL — ABNORMAL LOW (ref 150–400)
RBC: 4.62 MIL/uL (ref 4.22–5.81)
RDW: 13.2 % (ref 11.5–15.5)
WBC: 3.9 10*3/uL — ABNORMAL LOW (ref 4.0–10.5)
nRBC: 0 % (ref 0.0–0.2)

## 2018-04-17 LAB — SURGICAL PCR SCREEN
MRSA, PCR: NEGATIVE
Staphylococcus aureus: NEGATIVE

## 2018-04-17 NOTE — Patient Instructions (Addendum)
Jeff Stewart  04/17/2018   Your procedure is scheduled on: 04-24-2018   Report to [facility] Main  Entrance     Report to admitting at 10:15AM    Call this number if you have problems the morning of surgery [phone]    PLEASE [place]!  PLEASE BRING CPAP MASK AND  TUBING ONLY. DEVICE WILL BE PROVIDED!     Remember: Do not eat food or drink liquids :After Midnight. BRUSH YOUR TEETH MORNING OF SURGERY AND RINSE YOUR MOUTH OUT, NO CHEWING GUM CANDY OR MINTS.     Take these medicines the morning of surgery with A SIP OF WATER: prilosec, sertraline, zyrtec, flonase nasal spray                                 You may not have any metal on your body including hair pins and              piercings  Do not wear jewelry, make-up, lotions, powders or perfumes, deodorant                     Men may shave face and neck.   Do not bring valuables to the hospital. [place].  Contacts, dentures or bridgework may not be worn into surgery.  Leave suitcase in the car. After surgery it may be brought to your room.                 Please read over the following fact sheets you were given: _____________________________________________________________________             [facility] - Preparing for Surgery Before surgery, you can play an important role.  Because skin is not sterile, your skin needs to be as free of germs as possible.  You can reduce the number of germs on your skin by washing with CHG (chlorahexidine gluconate) soap before surgery.  CHG is an antiseptic cleaner which kills germs and bonds with the skin to continue killing germs even after washing. Please DO NOT use if you have an allergy to CHG or antibacterial soaps.  If your skin becomes reddened/irritated stop using the CHG and inform your nurse when you arrive at Short Stay. Do not shave (including legs  and underarms) for at least 48 hours prior to the first CHG shower.  You may shave your face/neck. Please follow these instructions carefully:  1.  Shower with CHG Soap the night before surgery and the  morning of Surgery.  2.  If you choose to wash your hair, wash your hair first as usual with your  normal  shampoo.  3.  After you shampoo, rinse your hair and body thoroughly to remove the  shampoo.                           4.  Use CHG as you would any other liquid soap.  You can apply chg directly  to the skin and wash                       Gently with a scrungie or  clean washcloth.  5.  Apply the CHG Soap to your body ONLY FROM THE NECK DOWN.   Do not use on face/ open                           Wound or open sores. Avoid contact with eyes, ears mouth and genitals (private parts).                       Wash face,  Genitals (private parts) with your normal soap.             6.  Wash thoroughly, paying special attention to the area where your surgery  will be performed.  7.  Thoroughly rinse your body with warm water from the neck down.  8.  DO NOT shower/wash with your normal soap after using and rinsing off  the CHG Soap.                9.  Pat yourself dry with a clean towel.            10.  Wear clean pajamas.            11.  Place clean sheets on your bed the night of your first shower and do not  sleep with pets. Day of Surgery : Do not apply any lotions/deodorants the morning of surgery.  Please wear clean clothes to the hospital/surgery center.  FAILURE TO FOLLOW THESE INSTRUCTIONS MAY RESULT IN THE CANCELLATION OF YOUR SURGERY PATIENT SIGNATURE_________________________________  NURSE SIGNATURE__________________________________  ________________________________________________________________________   Jeff Stewart  An incentive spirometer is a tool that can help keep your lungs clear and active. This tool measures how well you are filling your lungs with each breath.  Taking long deep breaths may help reverse or decrease the chance of developing breathing (pulmonary) problems (especially infection) following:  A long period of time when you are unable to move or be active. BEFORE THE PROCEDURE   If the spirometer includes an indicator to show your best effort, your nurse or respiratory therapist will set it to a desired goal.  If possible, sit up straight or lean slightly forward. Try not to slouch.  Hold the incentive spirometer in an upright position. INSTRUCTIONS FOR USE  1. Sit on the edge of your bed if possible, or sit up as far as you can in bed or on a chair. 2. Hold the incentive spirometer in an upright position. 3. Breathe out normally. 4. Place the mouthpiece in your mouth and seal your lips tightly around it. 5. Breathe in slowly and as deeply as possible, raising the piston or the ball toward the top of the column. 6. Hold your breath for 3-5 seconds or for as long as possible. Allow the piston or ball to fall to the bottom of the column. 7. Remove the mouthpiece from your mouth and breathe out normally. 8. Rest for a few seconds and repeat Steps 1 through 7 at least 10 times every 1-2 hours when you are awake. Take your time and take a few normal breaths between deep breaths. 9. The spirometer may include an indicator to show your best effort. Use the indicator as a goal to work toward during each repetition. 10. After each set of 10 deep breaths, practice coughing to be sure your lungs are clear. If you have an incision (the cut made at the time of surgery), support your incision when coughing  by placing a pillow or rolled up towels firmly against it. Once you are able to get out of bed, walk around indoors and cough well. You may stop using the incentive spirometer when instructed by your caregiver.  RISKS AND COMPLICATIONS  Take your time so you do not get dizzy or light-headed.  If you are in pain, you may need to take or ask for pain  medication before doing incentive spirometry. It is harder to take a deep breath if you are having pain. AFTER USE  Rest and breathe slowly and easily.  It can be helpful to keep track of a log of your progress. Your caregiver can provide you with a simple table to help with this. If you are using the spirometer at home, follow these instructions: [place] IF:   You are having difficultly using the spirometer.  You have trouble using the spirometer as often as instructed.  Your pain medication is not giving enough relief while using the spirometer.  You develop fever of 100.5 F (38.1 C) or higher. SEEK IMMEDIATE MEDICAL CARE IF:   You cough up bloody sputum that had not been present before.  You develop fever of 102 F (38.9 C) or greater.  You develop worsening pain at or near the incision site. MAKE SURE YOU:   Understand these instructions.  Will watch your condition.  Will get help right away if you are not doing well or get worse. Document Released: 07/18/2006 Document Revised: 05/30/2011 Document Reviewed: 09/18/2006 [facility] Patient Information 2014 [place], [place].   ________________________________________________________________________

## 2018-04-18 NOTE — Progress Notes (Signed)
Anesthesia Chart Review   Case:  [id number] Date/Time:  04/24/18 1229   Procedure:  TOTAL KNEE ARTHROPLASTY (Left )   Anesthesia type:  Choice   Pre-op diagnosis:  OA LEFT KNEE   Location:  [facility] / [facility] ORS   Surgeon:  Renette Butters, MD      DISCUSSION: 63 yo never smoker with h/o HTN, OSA w/CPAP, chronic back pain (SCS in place, internal morphine pump in place), left knee OA scheduled for above surgery on 04/24/2018 with Dr. Edmonia Lynch.   Pt was last seen by pain management for intrathecal morphine pump refill on 03/26/2018 (Care Everywhere), refill interval 43 days.  Per Dr. Lona Millard note pump refilled with Morphine 20mg /ml + Bupivacaine 7.5mg /ml at a rate of Morphine 17.994mg /day + Bupivacaine 6.7478mg /day.  Pt cleared by PCP, Dr. Vonna Kotyk Dettinger.  Last seen on 03/29/2018.  Per his note, "He denies any chest pain or palpitations or shortness of breath or wheezing. Patient denies any chest pain, shortness of breath, headaches or vision issues, abdominal complaints, diarrhea, nausea, vomiting, or joint issues. Patient has a normal EKG and will be cleared for surgery if blood work comes back normal, will sign the paperwork and send it on." Labs normal.   Pt can proceed with planned procedure barring acute status change.   VS: BP (!) 154/94   Pulse 66   Temp 36.6 C (Oral)   Resp 18   SpO2 97%   PROVIDERS: Dettinger, Fransisca Kaufmann, MD is PCP   Loni Beckwith, MD is endocrinologist   Kandace Blitz, MD with Pain Medicine  LABS: Labs reviewed: Acceptable for surgery. (all labs ordered are listed, but only abnormal results are displayed)  Labs Reviewed  CBC - Abnormal; Notable for the following components:      Result Value   WBC 3.9 (*)    Platelets 128 (*)    All other components within normal limits  SURGICAL PCR SCREEN  BASIC METABOLIC PANEL     IMAGES:   EKG: 03/29/2018 Rate 63 bpm Sinus rhythm  Voltage criteria for LVH Nonspecific QRS  widening Anterolateral ST-elevation-repolarization variant   CV: Stress Test 01/20/15 Impression:  1. No reversible ischemia or infarction 2. Normal left ventricular wall motion 3. Left ventricular ejection fraction 55% 4. Low-risk stress test findings.  Past Medical History:  Diagnosis Date  . Anxiety   . Carpal tunnel syndrome, bilateral   . Cataract    removed years ago  . Chronic back pain    internal morphine pump  . DDD (degenerative disc disease)    neck, lumbar  . Depression   . Dyslipidemia    diet controlled  . Hypertension    borderline  . Sleep apnea    wears C-PAP  . Status post insertion of spinal cord stimulator     Past Surgical History:  Procedure Laterality Date  . BACK SURGERY     x 6  . CARPAL TUNNEL RELEASE     bilateral  . COLONOSCOPY    . ELBOW SURGERY    . INGUINAL HERNIA REPAIR Right   . INTERNAL MORPHINE PUMP     . KNEE ARTHROSCOPY    . NASAL SEPTUM SURGERY     x 6  . neck fusion     . NISSEN FUNDOPLICATION    . SPINAL CORD STIMULATOR INSERTION    . STOMACH SURGERY    . UPPER GASTROINTESTINAL ENDOSCOPY      MEDICATIONS: . cetirizine (ZYRTEC) 10 MG tablet  .  fluticasone (FLONASE) 50 MCG/ACT nasal spray  . furosemide (LASIX) 40 MG tablet  . hydrochlorothiazide (HYDRODIURIL) 25 MG tablet  . losartan (COZAAR) 50 MG tablet  . omeprazole (PRILOSEC) 20 MG capsule  . sertraline (ZOLOFT) 100 MG tablet  . Syringe/Needle, Disp, (SYRINGE 3CC/21GX1-1/4") 21G X 1-1/4" 3 ML MISC  . testosterone cypionate (DEPOTESTOSTERONE CYPIONATE) 200 MG/ML injection   . 0.9 %  sodium chloride infusion     Maia [facility] Pre-Surgical Testing  [phone] 04/18/18 2:25 PM

## 2018-04-18 NOTE — Anesthesia Preprocedure Evaluation (Addendum)
Anesthesia Evaluation  Patient identified by MRN, date of birth, ID band Patient awake    Reviewed: Allergy & Precautions, NPO status , Patient's Chart, lab work & pertinent test results  Airway Mallampati: II  TM Distance: >3 FB Neck ROM: Full    Dental no notable dental hx.    Pulmonary sleep apnea and Continuous Positive Airway Pressure Ventilation ,    Pulmonary exam normal breath sounds clear to auscultation       Cardiovascular hypertension, Normal cardiovascular exam Rhythm:Regular Rate:Normal     Neuro/Psych negative neurological ROS  negative psych ROS   GI/Hepatic Neg liver ROS, GERD  ,  Endo/Other  negative endocrine ROS  Renal/GU negative Renal ROS  negative genitourinary   Musculoskeletal negative musculoskeletal ROS (+)   Abdominal   Peds negative pediatric ROS (+)  Hematology negative hematology ROS (+)   Anesthesia Other Findings   Reproductive/Obstetrics negative OB ROS                            Anesthesia Physical Anesthesia Plan  ASA: III  Anesthesia Plan: General   Post-op Pain Management:  Regional for Post-op pain   Induction: Intravenous  PONV Risk Score and Plan: 2 and Ondansetron and Dexamethasone  Airway Management Planned: LMA  Additional Equipment:   Intra-op Plan:   Post-operative Plan: Extubation in OR  Informed Consent: I have reviewed the patients History and Physical, chart, labs and discussed the procedure including the risks, benefits and alternatives for the proposed anesthesia with the patient or authorized representative who has indicated his/her understanding and acceptance.     Dental advisory given  Plan Discussed with: CRNA and Surgeon  Anesthesia Plan Comments: (See PST note 04/17/18, Konrad Felix, PA-C)       Anesthesia Quick Evaluation

## 2018-04-19 ENCOUNTER — Other Ambulatory Visit: Payer: Self-pay | Admitting: Orthopedic Surgery

## 2018-04-19 NOTE — Care Plan (Signed)
Spoke with patient and wife prior to surgery. Will discharge to home with HHPT and family. He has equipment at home. CPM delivered by [employer]. He will transition to OPPT as appropriate. Patient is aware of plan and agreeable. Choice offered.   Ladell Heads, [place]

## 2018-04-23 DIAGNOSIS — Z978 Presence of other specified devices: Secondary | ICD-10-CM | POA: Diagnosis not present

## 2018-04-23 DIAGNOSIS — M961 Postlaminectomy syndrome, not elsewhere classified: Secondary | ICD-10-CM | POA: Diagnosis not present

## 2018-04-23 DIAGNOSIS — G894 Chronic pain syndrome: Secondary | ICD-10-CM | POA: Diagnosis not present

## 2018-04-23 MED ORDER — BUPIVACAINE LIPOSOME 1.3 % IJ SUSP
20.0000 mL | Freq: Once | INTRAMUSCULAR | Status: DC
  Filled 2018-04-23: qty 20

## 2018-04-24 ENCOUNTER — Other Ambulatory Visit: Payer: Self-pay

## 2018-04-24 ENCOUNTER — Observation Stay (HOSPITAL_COMMUNITY)
Admission: RE | Admit: 2018-04-24 | Discharge: 2018-04-25 | Disposition: A | Discharge status: Home / Self Care | Payer: Medicare Other | Attending: Orthopedic Surgery | Admitting: Orthopedic Surgery

## 2018-04-24 ENCOUNTER — Inpatient Hospital Stay (HOSPITAL_COMMUNITY): Payer: Medicare Other | Admitting: Physician Assistant

## 2018-04-24 ENCOUNTER — Inpatient Hospital Stay (HOSPITAL_COMMUNITY): Payer: Medicare Other | Admitting: Anesthesiology

## 2018-04-24 ENCOUNTER — Encounter (HOSPITAL_COMMUNITY): Payer: Self-pay

## 2018-04-24 ENCOUNTER — Observation Stay (HOSPITAL_COMMUNITY): Payer: Medicare Other

## 2018-04-24 ENCOUNTER — Encounter (HOSPITAL_COMMUNITY)
Admission: RE | Disposition: A | Discharge status: Home / Self Care | Payer: Self-pay | Source: Home / Self Care | Attending: Orthopedic Surgery

## 2018-04-24 DIAGNOSIS — F32A Depression, unspecified: Secondary | ICD-10-CM | POA: Diagnosis present

## 2018-04-24 DIAGNOSIS — F419 Anxiety disorder, unspecified: Secondary | ICD-10-CM | POA: Diagnosis not present

## 2018-04-24 DIAGNOSIS — I10 Essential (primary) hypertension: Secondary | ICD-10-CM | POA: Diagnosis not present

## 2018-04-24 DIAGNOSIS — Z79890 Hormone replacement therapy (postmenopausal): Secondary | ICD-10-CM | POA: Diagnosis not present

## 2018-04-24 DIAGNOSIS — Z96652 Presence of left artificial knee joint: Secondary | ICD-10-CM | POA: Diagnosis not present

## 2018-04-24 DIAGNOSIS — Z96659 Presence of unspecified artificial knee joint: Secondary | ICD-10-CM

## 2018-04-24 DIAGNOSIS — M1712 Unilateral primary osteoarthritis, left knee: Secondary | ICD-10-CM | POA: Diagnosis not present

## 2018-04-24 DIAGNOSIS — G8918 Other acute postprocedural pain: Secondary | ICD-10-CM | POA: Diagnosis not present

## 2018-04-24 DIAGNOSIS — E785 Hyperlipidemia, unspecified: Secondary | ICD-10-CM | POA: Diagnosis not present

## 2018-04-24 DIAGNOSIS — Z79899 Other long term (current) drug therapy: Secondary | ICD-10-CM | POA: Insufficient documentation

## 2018-04-24 DIAGNOSIS — G8929 Other chronic pain: Secondary | ICD-10-CM

## 2018-04-24 DIAGNOSIS — M549 Dorsalgia, unspecified: Secondary | ICD-10-CM

## 2018-04-24 DIAGNOSIS — G4733 Obstructive sleep apnea (adult) (pediatric): Secondary | ICD-10-CM | POA: Diagnosis not present

## 2018-04-24 DIAGNOSIS — K219 Gastro-esophageal reflux disease without esophagitis: Secondary | ICD-10-CM | POA: Diagnosis present

## 2018-04-24 DIAGNOSIS — Z471 Aftercare following joint replacement surgery: Secondary | ICD-10-CM | POA: Diagnosis not present

## 2018-04-24 DIAGNOSIS — F329 Major depressive disorder, single episode, unspecified: Secondary | ICD-10-CM | POA: Diagnosis not present

## 2018-04-24 DIAGNOSIS — Z884 Allergy status to anesthetic agent status: Secondary | ICD-10-CM | POA: Diagnosis not present

## 2018-04-24 DIAGNOSIS — M1710 Unilateral primary osteoarthritis, unspecified knee: Secondary | ICD-10-CM | POA: Diagnosis present

## 2018-04-24 HISTORY — PX: TOTAL KNEE ARTHROPLASTY: SHX125

## 2018-04-24 SURGERY — ARTHROPLASTY, KNEE, TOTAL
Anesthesia: General | Site: Knee | Laterality: Left

## 2018-04-24 MED ORDER — KETAMINE HCL 10 MG/ML IJ SOLN
INTRAMUSCULAR | Status: DC | PRN
  Administered 2018-04-24: 30 mg via INTRAVENOUS
  Administered 2018-04-24 (×2): 10 mg via INTRAVENOUS

## 2018-04-24 MED ORDER — HYDROMORPHONE HCL 1 MG/ML IJ SOLN: 0.5000 mg | INTRAMUSCULAR | Status: DC | PRN

## 2018-04-24 MED ORDER — HYDROCHLOROTHIAZIDE 25 MG PO TABS
25.0000 mg | ORAL_TABLET | Freq: Every day | ORAL | Status: DC
  Administered 2018-04-25: 25 mg via ORAL
  Filled 2018-04-24: qty 1

## 2018-04-24 MED ORDER — DEXAMETHASONE SODIUM PHOSPHATE 10 MG/ML IJ SOLN
INTRAMUSCULAR | Status: DC | PRN
  Administered 2018-04-24: 10 mg via INTRAVENOUS

## 2018-04-24 MED ORDER — KETOROLAC TROMETHAMINE 30 MG/ML IJ SOLN: 30.0000 mg | Freq: Once | INTRAMUSCULAR | Status: DC | PRN

## 2018-04-24 MED ORDER — ACETAMINOPHEN 10 MG/ML IV SOLN
1000.0000 mg | Freq: Four times a day (QID) | INTRAVENOUS | Status: DC
  Administered 2018-04-24: 1000 mg via INTRAVENOUS

## 2018-04-24 MED ORDER — HYDROMORPHONE HCL 1 MG/ML IJ SOLN
0.2500 mg | INTRAMUSCULAR | Status: DC | PRN
  Administered 2018-04-24 (×2): 0.25 mg via INTRAVENOUS

## 2018-04-24 MED ORDER — DEXAMETHASONE SODIUM PHOSPHATE 10 MG/ML IJ SOLN
10.0000 mg | Freq: Once | INTRAMUSCULAR | Status: AC
  Administered 2018-04-25: 10 mg via INTRAVENOUS
  Filled 2018-04-24: qty 1

## 2018-04-24 MED ORDER — ONDANSETRON HCL 4 MG/2ML IJ SOLN
INTRAMUSCULAR | Status: DC | PRN
  Administered 2018-04-24: 4 mg via INTRAVENOUS

## 2018-04-24 MED ORDER — ONDANSETRON HCL 4 MG/2ML IJ SOLN: 4.0000 mg | Freq: Four times a day (QID) | INTRAMUSCULAR | Status: DC | PRN

## 2018-04-24 MED ORDER — GABAPENTIN 300 MG PO CAPS
300.0000 mg | ORAL_CAPSULE | Freq: Three times a day (TID) | ORAL | Status: DC
  Administered 2018-04-24 – 2018-04-25 (×2): 300 mg via ORAL
  Filled 2018-04-24 (×2): qty 1

## 2018-04-24 MED ORDER — CELECOXIB 200 MG PO CAPS
200.0000 mg | ORAL_CAPSULE | Freq: Two times a day (BID) | ORAL | Status: DC
  Administered 2018-04-24 – 2018-04-25 (×2): 200 mg via ORAL
  Filled 2018-04-24 (×2): qty 1

## 2018-04-24 MED ORDER — SODIUM CHLORIDE 0.9 % IR SOLN
Status: DC | PRN
  Administered 2018-04-24: 1000 mL

## 2018-04-24 MED ORDER — GABAPENTIN 300 MG PO CAPS: 300.0000 mg | ORAL_CAPSULE | Freq: Two times a day (BID) | ORAL | 0 refills | Status: DC

## 2018-04-24 MED ORDER — KETAMINE HCL 10 MG/ML IJ SOLN
INTRAMUSCULAR | Status: AC
  Filled 2018-04-24: qty 1

## 2018-04-24 MED ORDER — PROPOFOL 10 MG/ML IV BOLUS
INTRAVENOUS | Status: AC
  Filled 2018-04-24: qty 20

## 2018-04-24 MED ORDER — LACTATED RINGERS IV SOLN
INTRAVENOUS | Status: DC
  Administered 2018-04-24 – 2018-04-25 (×3): via INTRAVENOUS

## 2018-04-24 MED ORDER — FENTANYL CITRATE (PF) 100 MCG/2ML IJ SOLN
INTRAMUSCULAR | Status: DC | PRN
  Administered 2018-04-24: 50 ug via INTRAVENOUS
  Administered 2018-04-24 (×2): 25 ug via INTRAVENOUS

## 2018-04-24 MED ORDER — KETOROLAC TROMETHAMINE 30 MG/ML IJ SOLN
INTRAMUSCULAR | Status: AC
  Filled 2018-04-24: qty 1

## 2018-04-24 MED ORDER — PROMETHAZINE HCL 25 MG/ML IJ SOLN: 6.2500 mg | INTRAMUSCULAR | Status: DC | PRN

## 2018-04-24 MED ORDER — ESMOLOL HCL 100 MG/10ML IV SOLN
INTRAVENOUS | Status: AC
  Filled 2018-04-24: qty 10

## 2018-04-24 MED ORDER — CLONIDINE HCL (ANALGESIA) 100 MCG/ML EP SOLN
EPIDURAL | Status: DC | PRN
  Administered 2018-04-24: 70 ug

## 2018-04-24 MED ORDER — TRANEXAMIC ACID-NACL 1000-0.7 MG/100ML-% IV SOLN
1000.0000 mg | INTRAVENOUS | Status: AC
  Administered 2018-04-24: 1000 mg via INTRAVENOUS
  Filled 2018-04-24: qty 100

## 2018-04-24 MED ORDER — EPHEDRINE 5 MG/ML INJ
INTRAVENOUS | Status: AC
  Filled 2018-04-24: qty 10

## 2018-04-24 MED ORDER — ONDANSETRON HCL 4 MG PO TABS: 4.0000 mg | ORAL_TABLET | Freq: Three times a day (TID) | ORAL | 0 refills | Status: DC | PRN

## 2018-04-24 MED ORDER — PANTOPRAZOLE SODIUM 40 MG PO TBEC
40.0000 mg | DELAYED_RELEASE_TABLET | Freq: Every day | ORAL | Status: DC
  Administered 2018-04-25: 40 mg via ORAL
  Filled 2018-04-24: qty 1

## 2018-04-24 MED ORDER — METOCLOPRAMIDE HCL 5 MG PO TABS: 5.0000 mg | ORAL_TABLET | Freq: Three times a day (TID) | ORAL | Status: DC | PRN

## 2018-04-24 MED ORDER — POVIDONE-IODINE 10 % EX SWAB
2.0000 "application " | Freq: Once | CUTANEOUS | Status: AC
  Administered 2018-04-24: 2 via TOPICAL

## 2018-04-24 MED ORDER — CELECOXIB 200 MG PO CAPS: 200.0000 mg | ORAL_CAPSULE | Freq: Two times a day (BID) | ORAL | 0 refills | Status: AC

## 2018-04-24 MED ORDER — DIPHENHYDRAMINE HCL 12.5 MG/5ML PO ELIX: 12.5000 mg | ORAL_SOLUTION | ORAL | Status: DC | PRN

## 2018-04-24 MED ORDER — HYDROMORPHONE HCL 2 MG/ML IJ SOLN
INTRAMUSCULAR | Status: AC
  Filled 2018-04-24: qty 1

## 2018-04-24 MED ORDER — POLYETHYLENE GLYCOL 3350 17 G PO PACK: 17.0000 g | PACK | Freq: Every day | ORAL | Status: DC | PRN

## 2018-04-24 MED ORDER — PROPOFOL 10 MG/ML IV BOLUS
INTRAVENOUS | Status: DC | PRN
  Administered 2018-04-24: 200 mg via INTRAVENOUS

## 2018-04-24 MED ORDER — METHOCARBAMOL 500 MG IVPB - SIMPLE MED
INTRAVENOUS | Status: AC
  Filled 2018-04-24: qty 50

## 2018-04-24 MED ORDER — ONDANSETRON HCL 4 MG PO TABS: 4.0000 mg | ORAL_TABLET | Freq: Four times a day (QID) | ORAL | Status: DC | PRN

## 2018-04-24 MED ORDER — DOCUSATE SODIUM 100 MG PO CAPS: 100.0000 mg | ORAL_CAPSULE | Freq: Two times a day (BID) | ORAL | 0 refills | Status: DC

## 2018-04-24 MED ORDER — ASPIRIN 81 MG PO CHEW
81.0000 mg | CHEWABLE_TABLET | Freq: Two times a day (BID) | ORAL | Status: DC
  Administered 2018-04-24 – 2018-04-25 (×2): 81 mg via ORAL
  Filled 2018-04-24 (×2): qty 1

## 2018-04-24 MED ORDER — MENTHOL 3 MG MT LOZG: 1.0000 | LOZENGE | OROMUCOSAL | Status: DC | PRN

## 2018-04-24 MED ORDER — CEFAZOLIN SODIUM-DEXTROSE 2-4 GM/100ML-% IV SOLN
2.0000 g | INTRAVENOUS | Status: AC
  Administered 2018-04-24: 2 g via INTRAVENOUS
  Filled 2018-04-24: qty 100

## 2018-04-24 MED ORDER — CEFAZOLIN SODIUM-DEXTROSE 2-4 GM/100ML-% IV SOLN
2.0000 g | Freq: Four times a day (QID) | INTRAVENOUS | Status: AC
  Administered 2018-04-24 – 2018-04-25 (×2): 2 g via INTRAVENOUS
  Filled 2018-04-24 (×2): qty 100

## 2018-04-24 MED ORDER — EPHEDRINE SULFATE-NACL 50-0.9 MG/10ML-% IV SOSY
PREFILLED_SYRINGE | INTRAVENOUS | Status: DC | PRN
  Administered 2018-04-24 (×2): 10 mg via INTRAVENOUS
  Administered 2018-04-24: 5 mg via INTRAVENOUS
  Administered 2018-04-24: 15 mg via INTRAVENOUS

## 2018-04-24 MED ORDER — SODIUM CHLORIDE (PF) 0.9 % IJ SOLN
INTRAMUSCULAR | Status: AC
  Filled 2018-04-24: qty 50

## 2018-04-24 MED ORDER — ASPIRIN EC 81 MG PO TBEC: 81.0000 mg | DELAYED_RELEASE_TABLET | Freq: Two times a day (BID) | ORAL | 0 refills | Status: DC

## 2018-04-24 MED ORDER — ACETAMINOPHEN 500 MG PO TABS
1000.0000 mg | ORAL_TABLET | Freq: Once | ORAL | Status: AC
  Administered 2018-04-24: 1000 mg via ORAL
  Filled 2018-04-24: qty 2

## 2018-04-24 MED ORDER — FUROSEMIDE 40 MG PO TABS
40.0000 mg | ORAL_TABLET | ORAL | Status: DC
  Filled 2018-04-24: qty 1

## 2018-04-24 MED ORDER — DEXAMETHASONE SODIUM PHOSPHATE 10 MG/ML IJ SOLN
INTRAMUSCULAR | Status: AC
  Filled 2018-04-24: qty 1

## 2018-04-24 MED ORDER — LABETALOL HCL 5 MG/ML IV SOLN
INTRAVENOUS | Status: AC
  Filled 2018-04-24: qty 4

## 2018-04-24 MED ORDER — ACETAMINOPHEN 325 MG PO TABS: 325.0000 mg | ORAL_TABLET | Freq: Four times a day (QID) | ORAL | Status: DC | PRN

## 2018-04-24 MED ORDER — LIDOCAINE 2% (20 MG/ML) 5 ML SYRINGE
INTRAMUSCULAR | Status: DC | PRN
  Administered 2018-04-24: 50 mg via INTRAVENOUS

## 2018-04-24 MED ORDER — MIDAZOLAM HCL 2 MG/2ML IJ SOLN
1.0000 mg | INTRAMUSCULAR | Status: DC
  Administered 2018-04-24: 2 mg via INTRAVENOUS
  Filled 2018-04-24: qty 2

## 2018-04-24 MED ORDER — LABETALOL HCL 5 MG/ML IV SOLN
INTRAVENOUS | Status: DC | PRN
  Administered 2018-04-24 (×2): 5 mg via INTRAVENOUS

## 2018-04-24 MED ORDER — STERILE WATER FOR IRRIGATION IR SOLN
Status: DC | PRN
  Administered 2018-04-24: 2000 mL

## 2018-04-24 MED ORDER — ACETAMINOPHEN 10 MG/ML IV SOLN
INTRAVENOUS | Status: AC
  Filled 2018-04-24: qty 100

## 2018-04-24 MED ORDER — MAGNESIUM CITRATE PO SOLN: 1.0000 | Freq: Once | ORAL | Status: DC | PRN

## 2018-04-24 MED ORDER — GABAPENTIN 300 MG PO CAPS
300.0000 mg | ORAL_CAPSULE | Freq: Once | ORAL | Status: AC
  Administered 2018-04-24: 300 mg via ORAL
  Filled 2018-04-24: qty 1

## 2018-04-24 MED ORDER — DOCUSATE SODIUM 100 MG PO CAPS
100.0000 mg | ORAL_CAPSULE | Freq: Two times a day (BID) | ORAL | Status: DC
  Administered 2018-04-24 – 2018-04-25 (×2): 100 mg via ORAL
  Filled 2018-04-24 (×2): qty 1

## 2018-04-24 MED ORDER — SORBITOL 70 % SOLN: 30.0000 mL | Freq: Every day | Status: DC | PRN

## 2018-04-24 MED ORDER — LACTATED RINGERS IV SOLN
INTRAVENOUS | Status: DC
  Administered 2018-04-24 (×2): via INTRAVENOUS

## 2018-04-24 MED ORDER — LABETALOL HCL 5 MG/ML IV SOLN
10.0000 mg | INTRAVENOUS | Status: DC | PRN
  Filled 2018-04-24: qty 4

## 2018-04-24 MED ORDER — METOCLOPRAMIDE HCL 5 MG/ML IJ SOLN: 5.0000 mg | Freq: Three times a day (TID) | INTRAMUSCULAR | Status: DC | PRN

## 2018-04-24 MED ORDER — 0.9 % SODIUM CHLORIDE (POUR BTL) OPTIME
TOPICAL | Status: DC | PRN
  Administered 2018-04-24: 1000 mL

## 2018-04-24 MED ORDER — CHLORHEXIDINE GLUCONATE 4 % EX LIQD: 60.0000 mL | Freq: Once | CUTANEOUS | Status: DC

## 2018-04-24 MED ORDER — FENTANYL CITRATE (PF) 100 MCG/2ML IJ SOLN
INTRAMUSCULAR | Status: AC
  Filled 2018-04-24: qty 2

## 2018-04-24 MED ORDER — ROPIVACAINE HCL 7.5 MG/ML IJ SOLN
INTRAMUSCULAR | Status: DC | PRN
  Administered 2018-04-24: 20 mL via PERINEURAL

## 2018-04-24 MED ORDER — HYDROMORPHONE HCL 1 MG/ML IJ SOLN
INTRAMUSCULAR | Status: DC | PRN
  Administered 2018-04-24 (×8): .2 mg via INTRAVENOUS
  Administered 2018-04-24: 0.5 mg via INTRAVENOUS
  Administered 2018-04-24 (×2): .2 mg via INTRAVENOUS

## 2018-04-24 MED ORDER — OXYCODONE HCL 5 MG PO TABS: 5.0000 mg | ORAL_TABLET | ORAL | 0 refills | Status: AC | PRN

## 2018-04-24 MED ORDER — ACETAMINOPHEN 500 MG PO TABS: 1000.0000 mg | ORAL_TABLET | Freq: Three times a day (TID) | ORAL | 0 refills | Status: AC

## 2018-04-24 MED ORDER — LOSARTAN POTASSIUM 50 MG PO TABS
50.0000 mg | ORAL_TABLET | Freq: Two times a day (BID) | ORAL | Status: DC
  Administered 2018-04-24 – 2018-04-25 (×2): 50 mg via ORAL
  Filled 2018-04-24 (×2): qty 1

## 2018-04-24 MED ORDER — OXYCODONE HCL 5 MG PO TABS
5.0000 mg | ORAL_TABLET | ORAL | Status: DC | PRN
  Administered 2018-04-24 – 2018-04-25 (×2): 10 mg via ORAL
  Filled 2018-04-24 (×2): qty 2

## 2018-04-24 MED ORDER — SODIUM CHLORIDE (PF) 0.9 % IJ SOLN
INTRAMUSCULAR | Status: AC
  Filled 2018-04-24: qty 10

## 2018-04-24 MED ORDER — ONDANSETRON HCL 4 MG/2ML IJ SOLN
INTRAMUSCULAR | Status: AC
  Filled 2018-04-24: qty 2

## 2018-04-24 MED ORDER — FENTANYL CITRATE (PF) 100 MCG/2ML IJ SOLN
50.0000 ug | INTRAMUSCULAR | Status: DC
  Administered 2018-04-24: 50 ug via INTRAVENOUS
  Filled 2018-04-24: qty 2

## 2018-04-24 MED ORDER — METHOCARBAMOL 500 MG IVPB - SIMPLE MED
500.0000 mg | Freq: Four times a day (QID) | INTRAVENOUS | Status: DC | PRN
  Administered 2018-04-24: 500 mg via INTRAVENOUS
  Filled 2018-04-24: qty 50

## 2018-04-24 MED ORDER — ACETAMINOPHEN 500 MG PO TABS
1000.0000 mg | ORAL_TABLET | Freq: Three times a day (TID) | ORAL | Status: DC
  Administered 2018-04-24 – 2018-04-25 (×3): 1000 mg via ORAL
  Filled 2018-04-24 (×3): qty 2

## 2018-04-24 MED ORDER — SERTRALINE HCL 100 MG PO TABS
100.0000 mg | ORAL_TABLET | Freq: Every day | ORAL | Status: DC
  Administered 2018-04-24 – 2018-04-25 (×2): 100 mg via ORAL
  Filled 2018-04-24 (×2): qty 1

## 2018-04-24 MED ORDER — PHENOL 1.4 % MT LIQD: 1.0000 | OROMUCOSAL | Status: DC | PRN

## 2018-04-24 MED ORDER — HYDROMORPHONE HCL 1 MG/ML IJ SOLN
INTRAMUSCULAR | Status: AC
  Filled 2018-04-24: qty 1

## 2018-04-24 MED ORDER — LIDOCAINE 2% (20 MG/ML) 5 ML SYRINGE
INTRAMUSCULAR | Status: AC
  Filled 2018-04-24: qty 5

## 2018-04-24 MED ORDER — SODIUM CHLORIDE 0.9% FLUSH
INTRAVENOUS | Status: DC | PRN
  Administered 2018-04-24: 30 mL

## 2018-04-24 MED ORDER — BUPIVACAINE LIPOSOME 1.3 % IJ SUSP
INTRAMUSCULAR | Status: DC | PRN
  Administered 2018-04-24: 20 mL

## 2018-04-24 MED ORDER — METHOCARBAMOL 500 MG PO TABS
500.0000 mg | ORAL_TABLET | Freq: Four times a day (QID) | ORAL | Status: DC | PRN
  Filled 2018-04-24: qty 1

## 2018-04-24 MED ORDER — METHOCARBAMOL 500 MG PO TABS: 500.0000 mg | ORAL_TABLET | Freq: Three times a day (TID) | ORAL | 0 refills | Status: DC | PRN

## 2018-04-24 SURGICAL SUPPLY — 47 items
BLADE HEX COATED 2.75 (ELECTRODE) ×2 IMPLANT
BLADE SAG 18X100X1.27 (BLADE) ×2 IMPLANT
BLADE SAGITTAL 25.0X1.37X90 (BLADE) ×2 IMPLANT
BLADE SURG 15 STRL LF DISP TIS (BLADE) ×1 IMPLANT
BLADE SURG 15 STRL SS (BLADE) ×2
BLADE SURG SZ10 CARB STEEL (BLADE) ×4 IMPLANT
BNDG CMPR MED 10X6 ELC LF (GAUZE/BANDAGES/DRESSINGS) ×1
BNDG ELASTIC 6X10 VLCR STRL LF (GAUZE/BANDAGES/DRESSINGS) ×2 IMPLANT
BOWL SMART MIX CTS (DISPOSABLE) IMPLANT
BSPLAT TIB 7 KN TRITANIUM (Knees) ×1 IMPLANT
CLSR STERI-STRIP ANTIMIC 1/2X4 (GAUZE/BANDAGES/DRESSINGS) ×2 IMPLANT
COVER SURGICAL LIGHT HANDLE (MISCELLANEOUS) ×2 IMPLANT
COVER WAND RF STERILE (DRAPES) IMPLANT
CUFF TOURN SGL QUICK 34 (TOURNIQUET CUFF) ×2
CUFF TRNQT CYL 34X4X40X1 (TOURNIQUET CUFF) ×1 IMPLANT
DECANTER SPIKE VIAL GLASS SM (MISCELLANEOUS) IMPLANT
DRAPE U-SHAPE 47X51 STRL (DRAPES) ×2 IMPLANT
DRSG MEPILEX BORDER 4X12 (GAUZE/BANDAGES/DRESSINGS) ×2 IMPLANT
DURAPREP 26ML APPLICATOR (WOUND CARE) ×4 IMPLANT
FEMORAL POSTERIOR SZ7 LFT (Femur) IMPLANT
GLOVE BIO SURGEON STRL SZ7.5 (GLOVE) ×4 IMPLANT
GLOVE BIOGEL PI IND STRL 8 (GLOVE) ×2 IMPLANT
GLOVE BIOGEL PI INDICATOR 8 (GLOVE) ×2
GOWN STRL REUS W/ TWL LRG LVL3 (GOWN DISPOSABLE) ×2 IMPLANT
GOWN STRL REUS W/TWL LRG LVL3 (GOWN DISPOSABLE) ×4
HANDPIECE INTERPULSE COAX TIP (DISPOSABLE) ×2
HOLDER FOLEY CATH W/STRAP (MISCELLANEOUS) IMPLANT
IMMOBILIZER KNEE 20 (SOFTGOODS) ×2 IMPLANT
IMMOBILIZER KNEE 22 UNIV (SOFTGOODS) ×2 IMPLANT
INSERT TIBIAL BEARING KNEE (Joint) ×1 IMPLANT
KNEE PATELLA ASYMMETRIC 10X35 (Knees) ×1 IMPLANT
KNEE TIBIAL COMPONENT SZ7 (Knees) ×1 IMPLANT
MANIFOLD NEPTUNE II (INSTRUMENTS) ×2 IMPLANT
NS IRRIG 1000ML POUR BTL (IV SOLUTION) ×2 IMPLANT
PACK TOTAL KNEE CUSTOM (KITS) ×2 IMPLANT
POSTERIOR FEMORAL SZ7 LFT (Femur) ×2 IMPLANT
PROTECTOR NERVE ULNAR (MISCELLANEOUS) ×2 IMPLANT
SET HNDPC FAN SPRY TIP SCT (DISPOSABLE) IMPLANT
SUT MNCRL AB 4-0 PS2 18 (SUTURE) ×2 IMPLANT
SUT VIC AB 0 CT1 27 (SUTURE) ×2
SUT VIC AB 0 CT1 27XBRD ANBCTR (SUTURE) ×1 IMPLANT
SUT VIC AB 0 CT1 36 (SUTURE) ×2 IMPLANT
SUT VIC AB 1 CT1 36 (SUTURE) ×4 IMPLANT
SUT VIC AB 2-0 CT1 27 (SUTURE) ×2
SUT VIC AB 2-0 CT1 TAPERPNT 27 (SUTURE) ×1 IMPLANT
WRAP KNEE MAXI GEL POST OP (GAUZE/BANDAGES/DRESSINGS) ×1 IMPLANT
YANKAUER SUCT BULB TIP 10FT TU (MISCELLANEOUS) ×2 IMPLANT

## 2018-04-24 NOTE — Anesthesia Procedure Notes (Signed)
Procedure Name: LMA Insertion Date/Time: 04/24/2018 12:38 PM Performed by: Anne Fu, CRNA Pre-anesthesia Checklist: Patient identified, Emergency Drugs available, Suction available, Patient being monitored and Timeout performed Patient Re-evaluated:Patient Re-evaluated prior to induction Oxygen Delivery Method: Circle system utilized Preoxygenation: Pre-oxygenation with 100% oxygen Induction Type: IV induction Ventilation: Mask ventilation without difficulty LMA: LMA inserted LMA Size: 5.0 Number of attempts: 1 Placement Confirmation: positive ETCO2 and breath sounds checked- equal and bilateral Tube secured with: Tape

## 2018-04-24 NOTE — Discharge Instructions (Signed)
You may bear weight as tolerated. °Keep your dressing on and dry until follow up. °Take medicine to prevent blood clots as directed. °Take pain medicine as needed with the goal of transitioning to over the counter medicines.  °If needed, you may increase breakthrough pain medication (oxycodone) for the first few days post op - up to 2 tablets every 4 hours.  Stop as this medication as soon as you are able. ° °INSTRUCTIONS AFTER JOINT REPLACEMENT  ° °o Remove items at home which could result in a fall. This includes throw rugs or furniture in walking pathways °o ICE to the affected joint every three hours while awake for 30 minutes at a time, for at least the first 3-5 days, and then as needed for pain and swelling.  Continue to use ice for pain and swelling. You may notice swelling that will progress down to the foot and ankle.  This is normal after surgery.  Elevate your leg when you are not up walking on it.   °o Continue to use the breathing machine you got in the hospital (incentive spirometer) which will help keep your temperature down.  It is common for your temperature to cycle up and down following surgery, especially at night when you are not up moving around and exerting yourself.  The breathing machine keeps your lungs expanded and your temperature down. ° ° °DIET:  As you were doing prior to hospitalization, we recommend a well-balanced diet. ° °DRESSING / WOUND CARE / SHOWERING ° °You may shower 3 days after surgery, but keep the wounds dry during showering.  You may use an occlusive plastic wrap (Press'n Seal for example) with blue painter's tape at edges, NO SOAKING/SUBMERGING IN THE BATHTUB.  If the bandage gets wet, change with a clean dry gauze.  If the incision gets wet, pat the wound dry with a clean towel. ° °ACTIVITY ° °o Increase activity slowly as tolerated, but follow the weight bearing instructions below.   °o No driving for 6 weeks or until further direction given by your physician.  You  cannot drive while taking narcotics.  °o No lifting or carrying greater than 10 lbs. until further directed by your surgeon. °o Avoid periods of inactivity such as sitting longer than an hour when not asleep. This helps prevent blood clots.  °o You may return to work once you are authorized by your doctor.  ° ° ° °WEIGHT BEARING  ° °Weight bearing as tolerated with assist device (walker, cane, etc) as directed, use it as long as suggested by your surgeon or therapist, typically at least 4-6 weeks. ° ° °EXERCISES ° °Results after joint replacement surgery are often greatly improved when you follow the exercise, range of motion and muscle strengthening exercises prescribed by your doctor. Safety measures are also important to protect the joint from further injury. Any time any of these exercises cause you to have increased pain or swelling, decrease what you are doing until you are comfortable again and then slowly increase them. If you have problems or questions, call your caregiver or physical therapist for advice.  ° °Rehabilitation is important following a joint replacement. After just a few days of immobilization, the muscles of the leg can become weakened and shrink (atrophy).  These exercises are designed to build up the tone and strength of the thigh and leg muscles and to improve motion. Often times heat used for twenty to thirty minutes before working out will loosen up your tissues and help   with improving the range of motion but do not use heat for the first two weeks following surgery (sometimes heat can increase post-operative swelling).   These exercises can be done on a training (exercise) mat, on the floor, on a table or on a bed. Use whatever works the best and is most comfortable for you.    Use music or television while you are exercising so that the exercises are a pleasant break in your day. This will make your life better with the exercises acting as a break in your routine that you can look  forward to.   Perform all exercises about fifteen times, three times per day or as directed.  You should exercise both the operative leg and the other leg as well.  Exercises include:    Quad Sets - Tighten up the muscle on the front of the thigh (Quad) and hold for 5-10 seconds.    Straight Leg Raises - With your knee straight (if you were given a brace, keep it on), lift the leg to 60 degrees, hold for 3 seconds, and slowly lower the leg.  Perform this exercise against resistance later as your leg gets stronger.   Leg Slides: Lying on your back, slowly slide your foot toward your buttocks, bending your knee up off the floor (only go as far as is comfortable). Then slowly slide your foot back down until your leg is flat on the floor again.   Angel Wings: Lying on your back spread your legs to the side as far apart as you can without causing discomfort.   Hamstring Strength:  Lying on your back, push your heel against the floor with your leg straight by tightening up the muscles of your buttocks.  Repeat, but this time bend your knee to a comfortable angle, and push your heel against the floor.  You may put a pillow under the heel to make it more comfortable if necessary.   A rehabilitation program following joint replacement surgery can speed recovery and prevent re-injury in the future due to weakened muscles. Contact your doctor or a physical therapist for more information on knee rehabilitation.    CONSTIPATION  Constipation is defined medically as fewer than three stools per week and severe constipation as less than one stool per week.  Even if you have a regular bowel pattern at home, your normal regimen is likely to be disrupted due to multiple reasons following surgery.  Combination of anesthesia, postoperative narcotics, change in appetite and fluid intake all can affect your bowels.   YOU MUST use at least one of the following options; they are listed in order of increasing strength  to get the job done.  They are all available over the counter, and you may need to use some, POSSIBLY even all of these options:    Drink plenty of fluids (prune juice may be helpful) and high fiber foods Colace 100 mg by mouth twice a day  Senokot for constipation as directed and as needed Dulcolax (bisacodyl), take with full glass of water  Miralax (polyethylene glycol) once or twice a day as needed.  If you have tried all these things and are unable to have a bowel movement in the first 3-4 days after surgery call either your surgeon or your primary doctor.    If you experience loose stools or diarrhea, hold the medications until you stool forms back up.  If your symptoms do not get better within 1 week or if they get  worse, check with your doctor.  If you experience "the worst abdominal pain ever" or develop nausea or vomiting, please contact the office immediately for further recommendations for treatment.   ITCHING:  If you experience itching with your medications, try taking only a single pain pill, or even half a pain pill at a time.  You can also use Benadryl over the counter for itching or also to help with sleep.   TED HOSE STOCKINGS:  Use stockings on both legs until for at least 2 weeks or as directed by physician office. They may be removed at night for sleeping.  MEDICATIONS:  See your medication summary on the After Visit Summary that nursing will review with you.  You may have some home medications which will be placed on hold until you complete the course of blood thinner medication.  It is important for you to complete the blood thinner medication as prescribed.  PRECAUTIONS:  If you experience chest pain or shortness of breath - call 911 immediately for transfer to the hospital emergency department.   If you develop a fever greater that 101 F, purulent drainage from wound, increased redness or drainage from wound, foul odor from the wound/dressing, or calf pain - CONTACT  YOUR SURGEON.                                                   FOLLOW-UP APPOINTMENTS:  If you do not already have a post-op appointment, please call the office for an appointment to be seen by your surgeon.  Guidelines for how soon to be seen are listed in your After Visit Summary, but are typically between 1-4 weeks after surgery.  OTHER INSTRUCTIONS:     MAKE SURE YOU:   Understand these instructions.   Get help right away if you are not doing well or get worse.    Thank you for letting us be a part of your medical care team.  It is a privilege we respect greatly.  We hope these instructions will help you stay on track for a fast and full recovery!

## 2018-04-24 NOTE — Care Plan (Signed)
Ortho Bundle Case Management Note  Patient Details  Name: Jeff Stewart MRN: [id number] Date of Birth: 09/11/1955   Spoke with patient and wife prior to surgery. Will discharge to home with HHPT and family. He has equipment at home. CPM delivered by [employer]. He will transition to OPPT as appropriate. Patient is aware of plan and agreeable. Choice offered                  DME Arranged:  CPM DME Agency:  [employer]  HH Arranged:  PT [place] Agency:  [facility])  Additional Comments: Please contact me with any questions of if this plan should need to change.  Ladell Heads,  [place] Specialist  [phone] 04/24/2018, 1:10 PM

## 2018-04-24 NOTE — Transfer of Care (Signed)
Immediate Anesthesia Transfer of Care Note  Patient: Jeff Stewart  Procedure(s) Performed: Procedure(s): TOTAL KNEE ARTHROPLASTY (Left)  Patient Location: PACU  Anesthesia Type:General  Level of Consciousness:  sedated, patient cooperative and responds to stimulation  Airway & Oxygen Therapy:Patient Spontanous Breathing and Patient connected to face mask oxgen  Post-op Assessment:  Report given to PACU RN and Post -op Vital signs reviewed and stable  Post vital signs:  Reviewed and stable  Last Vitals:  Vitals:   04/24/18 1205 04/24/18 1206  BP: 108/66   Pulse: 68 63  Resp: (!) 23 12  Temp:    SpO2: 16% 75%    Complications: No apparent anesthesia complications

## 2018-04-24 NOTE — Op Note (Signed)
DATE OF SURGERY:  04/24/2018 TIME: 2:46 PM  PATIENT NAME:  Jeff Stewart   AGE: 63 y.o.    PRE-OPERATIVE DIAGNOSIS:  OA LEFT KNEE  POST-OPERATIVE DIAGNOSIS:  Same  PROCEDURE:  Procedure(s): TOTAL KNEE ARTHROPLASTY   SURGEON:  Renette Butters, MD   ASSISTANT:  Roxan Hockey, PA-C, he was present and scrubbed throughout the case, critical for completion in a timely fashion, and for retraction, instrumentation, and closure.    OPERATIVE IMPLANTS: [employer] Triathlon Posterior Stabilized. Press fit knee  Femur size 7, Tibia size 7, Patella size 35 3-peg oval button, with a 9 mm polyethylene insert.   PREOPERATIVE INDICATIONS:  Jeff Stewart is a 63 y.o. year old male with end stage bone on bone degenerative arthritis of the knee who failed conservative treatment, including injections, antiinflammatories, activity modification, and assistive devices, and had significant impairment of their activities of daily living, and elected for Total Knee Arthroplasty.   The risks, benefits, and alternatives were discussed at length including but not limited to the risks of infection, bleeding, nerve injury, stiffness, blood clots, the need for revision surgery, cardiopulmonary complications, among others, and they were willing to proceed.   OPERATIVE DESCRIPTION:  The patient was brought to the operative room and placed in a supine position.  General anesthesia was administered.  IV antibiotics were given.  The lower extremity was prepped and draped in the usual sterile fashion.  Time out was performed.  The leg was elevated and exsanguinated and the tourniquet was inflated.  Anterior approach was performed.  The patella was everted and osteophytes were removed.  The anterior horn of the medial and lateral meniscus was removed.   The distal femur was opened with the drill and the intramedullary distal femoral cutting jig was utilized, set at 5 degrees resecting 10 mm off the distal femur.   Care was taken to protect the collateral ligaments.  The distal femoral sizing jig was applied, taking care to avoid notching.  Then the 4-in-1 cutting jig was applied and the anterior and posterior femur was cut, along with the chamfer cuts.  All posterior osteophytes were removed.  The flexion gap was then measured and was symmetric with the extension gap.  Then the extramedullary tibial cutting jig was utilized making the appropriate cut using the anterior tibial crest as a reference building in appropriate posterior slope.  Care was taken during the cut to protect the medial and collateral ligaments.  The proximal tibia was removed along with the posterior horns of the menisci.  The PCL was sacrificed.    The extensor gap was measured and was approximately 68mm.    I completed the distal femoral preparation using the appropriate jig to prepare the box.  The patella was then measured, and cut with the saw.    The proximal tibia sized and prepared accordingly with the reamer and the punch, and then all components were trialed with the above sized poly insert.  The knee was found to have excellent balance and full motion.    The above named components were then impacted into place and Poly tibial piece and patella were inserted.  I was very happy with his stability and ROM  I performed a periarticular injection with marcaine and toradol  The knee was easily taken through a range of motion and the patella tracked well and the knee irrigated copiously and the parapatellar and subcutaneous tissue closed with vicryl, and monocryl with steri strips for the skin.  The incision was dressed with sterile gauze and the tourniquet released and the patient was awakened and returned to the PACU in stable and satisfactory condition.  There were no complications.  Total tourniquet time was roughly 75 minutes.   POSTOPERATIVE PLAN: post op Abx, DVT px: SCD's, TED's, Early ambulation and chemical  px

## 2018-04-24 NOTE — Anesthesia Procedure Notes (Signed)
Anesthesia Procedure Image    

## 2018-04-24 NOTE — Progress Notes (Signed)
Assisted Dr. Rose with left, ultrasound guided, adductor canal block. Side rails up, monitors on throughout procedure. See vital signs in flow sheet. Tolerated Procedure well.  

## 2018-04-24 NOTE — Progress Notes (Signed)
Upon receiving report, I was notified that the pt last voided around 1030, prior to surgery. The pt arrived to the unit at [facility] and was unable to void. I bladder scanned the pt with a volume of 551 ml. At [place], I inserted a straight cath with an output volume of 725 ml. The pt is due to void.

## 2018-04-24 NOTE — Anesthesia Procedure Notes (Signed)
Anesthesia Regional Block: Adductor canal block   Pre-Anesthetic Checklist: ,, timeout performed, Correct Patient, Correct Site, Correct Laterality, Correct Procedure, Correct Position, site marked, Risks and benefits discussed,  Surgical consent,  Pre-op evaluation,  At surgeon's request and post-op pain management  Laterality: Left  Prep: chloraprep       Needles:  Injection technique: Single-shot  Needle Type: Echogenic Needle     Needle Length: 9cm      Additional Needles:   Procedures:,,,, ultrasound used (permanent image in chart),,,,  Narrative:  Start time: 04/24/2018 11:40 AM End time: 04/24/2018 11:48 AM Injection made incrementally with aspirations every 5 mL.  Performed by: Personally  Anesthesiologist: Myrtie Soman, MD  Additional Notes: Patient tolerated the procedure well without complications

## 2018-04-24 NOTE — Interval H&P Note (Signed)
I participated in the care of this patient and agree with the above history, physical and evaluation. I performed a review of the history and a physical exam as detailed   Arville Daniel Hayde Kilgour MD  

## 2018-04-25 ENCOUNTER — Encounter (HOSPITAL_COMMUNITY): Payer: Self-pay | Admitting: Orthopedic Surgery

## 2018-04-25 DIAGNOSIS — Z79899 Other long term (current) drug therapy: Secondary | ICD-10-CM | POA: Diagnosis not present

## 2018-04-25 DIAGNOSIS — K219 Gastro-esophageal reflux disease without esophagitis: Secondary | ICD-10-CM | POA: Diagnosis not present

## 2018-04-25 DIAGNOSIS — F419 Anxiety disorder, unspecified: Secondary | ICD-10-CM | POA: Diagnosis not present

## 2018-04-25 DIAGNOSIS — M1712 Unilateral primary osteoarthritis, left knee: Secondary | ICD-10-CM | POA: Diagnosis not present

## 2018-04-25 DIAGNOSIS — F329 Major depressive disorder, single episode, unspecified: Secondary | ICD-10-CM | POA: Diagnosis not present

## 2018-04-25 DIAGNOSIS — G4733 Obstructive sleep apnea (adult) (pediatric): Secondary | ICD-10-CM | POA: Diagnosis not present

## 2018-04-25 NOTE — Plan of Care (Signed)
  Problem: Activity: Goal: Ability to avoid complications of mobility impairment will improve Outcome: Progressing   Problem: Activity: Goal: Range of joint motion will improve Outcome: Progressing   Problem: Pain Management: Goal: Pain level will decrease with appropriate interventions Outcome: Progressing   Problem: Education: Goal: Knowledge of General Education information will improve Description Including pain rating scale, medication(s)/side effects and non-pharmacologic comfort measures Outcome: Progressing   Problem: Activity: Goal: Risk for activity intolerance will decrease Outcome: Progressing   Problem: Elimination: Goal: Will not experience complications related to urinary retention Outcome: Progressing   Problem: Pain Managment: Goal: General experience of comfort will improve Outcome: Progressing   Problem: Safety: Goal: Ability to remain free from injury will improve Outcome: Progressing

## 2018-04-25 NOTE — Progress Notes (Signed)
Reviewed discharge instructions and medications. Patient and wife verbalize understanding and have no further questions.

## 2018-04-25 NOTE — Discharge Summary (Signed)
Discharge Summary  Patient ID: Jeff Stewart MRN: [id number] DOB/AGE: 04-01-1955 63 y.o.  Admit date: 04/24/2018 Discharge date: 04/25/2018  Admission Diagnoses:  Primary osteoarthritis of left knee  Discharge Diagnoses:  Principal Problem:   Primary osteoarthritis of left knee Active Problems:   Anxiety and depression   Essential hypertension, benign   GERD (gastroesophageal reflux disease)   OSA (obstructive sleep apnea)   Primary localized osteoarthritis of knee   Past Medical History:  Diagnosis Date  . Anxiety   . Carpal tunnel syndrome, bilateral   . Cataract    removed years ago  . Chronic back pain    internal morphine pump  . DDD (degenerative disc disease)    neck, lumbar  . Depression   . Dyslipidemia    diet controlled  . Hypertension    borderline  . Sleep apnea    wears C-PAP  . Status post insertion of spinal cord stimulator     Surgeries: Procedure(s): TOTAL KNEE ARTHROPLASTY on 04/24/2018   Consultants (if any):   Discharged Condition: Improved  Hospital Course: Jeff Stewart is an 63 y.o. male who was admitted 04/24/2018 with a diagnosis of Primary osteoarthritis of left knee and went to the operating room on 04/24/2018 and underwent the above named procedures.    He was given perioperative antibiotics:  Anti-infectives (From admission, onward)   Start     Dose/Rate Route Frequency Ordered Stop   04/24/18 1900  ceFAZolin (ANCEF) IVPB 2g/100 mL premix     2 g 200 mL/hr over 30 Minutes Intravenous Every 6 hours 04/24/18 1819 04/25/18 0056   04/24/18 1015  ceFAZolin (ANCEF) IVPB 2g/100 mL premix     2 g 200 mL/hr over 30 Minutes Intravenous On call to O.R. 04/24/18 1011 04/24/18 1240    .  He was given sequential compression devices, early ambulation, and Aspirin for DVT prophylaxis.  He benefited maximally from the hospital stay and there were no complications.    Recent vital signs:  Vitals:   04/24/18 2148 04/25/18 0134  BP: 110/69  117/64  Pulse: 82 72  Resp: 16 20  Temp: 97.7 F (36.5 C)   SpO2: 98% 96%    Recent laboratory studies:  Lab Results  Component Value Date   HGB 13.7 04/17/2018   HGB 14.5 03/29/2018   HGB 13.6 01/16/2018   Lab Results  Component Value Date   WBC 3.9 (L) 04/17/2018   PLT 128 (L) 04/17/2018   No results found for: INR Lab Results  Component Value Date   NA 138 04/17/2018   K 4.1 04/17/2018   CL 102 04/17/2018   CO2 28 04/17/2018   BUN 22 04/17/2018   CREATININE 0.95 04/17/2018   GLUCOSE 82 04/17/2018    Discharge Medications:   Allergies as of 04/25/2018      Reactions   Nubain [nalbuphine Hcl] Rash      Medication List    TAKE these medications   acetaminophen 500 MG tablet Commonly known as:  TYLENOL Take 2 tablets (1,000 mg total) by mouth every 8 (eight) hours for 10 days. For Pain.   aspirin EC 81 MG tablet Take 1 tablet (81 mg total) by mouth 2 (two) times daily. For DVT prophylaxis for 30 days after surgery.   celecoxib 200 MG capsule Commonly known as:  CELEBREX Take 1 capsule (200 mg total) by mouth 2 (two) times daily for 14 days. For 2 weeks post op for pain and inflammation. Continue omeprazole.  cetirizine 10 MG tablet Commonly known as:  ZYRTEC Take 10 mg by mouth daily.   docusate sodium 100 MG capsule Commonly known as:  COLACE Take 1 capsule (100 mg total) by mouth 2 (two) times daily. To prevent constipation while taking pain medication.   fluticasone 50 MCG/ACT nasal spray Commonly known as:  FLONASE Place 1 spray into both nostrils 2 (two) times daily as needed for allergies or rhinitis.   furosemide 40 MG tablet Commonly known as:  LASIX Take 1 tablet (40 mg total) by mouth every morning.   gabapentin 300 MG capsule Commonly known as:  NEURONTIN Take 1 capsule (300 mg total) by mouth 2 (two) times daily for 14 days. For 2 weeks post op for pain.   hydrochlorothiazide 25 MG tablet Commonly known as:  HYDRODIURIL TAKE 1 TABLET  DAILY   losartan 50 MG tablet Commonly known as:  COZAAR Take 1 tablet (50 mg total) by mouth 2 (two) times daily. Take 50 mg 2 (two) times daily by mouth. What changed:  additional instructions   methocarbamol 500 MG tablet Commonly known as:  ROBAXIN Take 1 tablet (500 mg total) by mouth every 8 (eight) hours as needed for muscle spasms.   omeprazole 20 MG capsule Commonly known as:  PRILOSEC Take 1 capsule (20 mg total) by mouth 2 (two) times daily before a meal.   ondansetron 4 MG tablet Commonly known as:  ZOFRAN Take 1 tablet (4 mg total) by mouth every 8 (eight) hours as needed for nausea or vomiting.   oxyCODONE 5 MG immediate release tablet Commonly known as:  ROXICODONE Take 1 tablet (5 mg total) by mouth every 4 (four) hours as needed for up to 30 days for breakthrough pain.   sertraline 100 MG tablet Commonly known as:  ZOLOFT Take 1 tablet (100 mg total) by mouth daily. (Needs to be seen before next refill) What changed:  additional instructions   SYRINGE 3CC/21GX1-1/4" 21G X 1-1/4" 3 ML Misc 1 each by Does not apply route once a week.   testosterone cypionate 200 MG/ML injection Commonly known as:  DEPOTESTOSTERONE CYPIONATE Inject 0.5 mLs (100 mg total) into the muscle once a week. What changed:  when to take this       Diagnostic Studies: Dg Knee Left Port  Result Date: 04/24/2018 CLINICAL DATA:  Status post LEFT knee replacement. EXAM: PORTABLE LEFT KNEE - 1-2 VIEW COMPARISON:  01/29/2018 MR FINDINGS: LEFT total knee arthroplasty identified without complicating features. Expected postoperative soft tissue changes noted. IMPRESSION: LEFT total knee arthroplasty without complicating features. Electronically Signed   By: Margarette Canada M.D.   On: 04/24/2018 16:00    Disposition: Discharge disposition: 01-Home or Self Care       Discharge Instructions    Discharge patient   Complete by:  As directed    Discharge disposition:  01-Home or Self Care    Discharge patient date:  04/25/2018      Follow-up Information    Renette Butters, MD. Go on 05/09/2018.   Specialty:  Orthopedic Surgery Why:  Your appointment has been made for 4:00 pm Contact information: [address] [place] [address] [phone]        [facility] Follow up.   Specialty:  [place] Why:  You will be seen by HHPT for 5 visits prior to your MD follow up  Contact information: [place] [address] [phone]  [facility]. Go on 05/09/2018.   Why:  You are scheduled to start outpatient physical therapy at 1:00pm Please arrive at 12:45 to complete your paperwork.  Contact information: [phone]       Renette Butters, MD.   Specialty:  Orthopedic Surgery Contact information: [address] [place] [address] [phone]            Signed: Prudencio Burly III PA-C 04/25/2018, 7:53 AM

## 2018-04-25 NOTE — Evaluation (Signed)
Occupational Therapy Evaluation Patient Details Name: Jeff Stewart MRN: [id number] DOB: [birth year] Today's Date: 04/25/2018    History of Present Illness s/p L TKA. H/o multiple back sxs   Clinical Impression   This 63 year old man was admitted for the above.  All education was completed. No further OT needs     Follow Up Recommendations  Supervision/Assistance - 24 hour    Equipment Recommendations  None recommended by OT    Recommendations for Other Services       Precautions / Restrictions Precautions Precautions: Knee;Fall Restrictions Weight Bearing Restrictions: No      Mobility Bed Mobility               General bed mobility comments: oob  Transfers Overall transfer level: Needs assistance Equipment used: Rolling walker (2 wheeled) Transfers: Sit to/from Stand Sit to Stand: Supervision              Balance                                           ADL either performed or assessed with clinical judgement   ADL Overall ADL's : Needs assistance/impaired     Grooming: Wash/dry hands;Wash/dry face;Standing;Supervision/safety   Upper Body Bathing: Set up   Lower Body Bathing: Moderate assistance;Sit to/from stand   Upper Body Dressing : Set up   Lower Body Dressing: Maximal assistance;Sit to/from stand   Toilet Transfer: Min guard;Ambulation;BSC;RW   Toileting- Clothing Manipulation and Hygiene: Supervision/safety;Sit to/from stand         General ADL Comments: performed ADL in bathroom. Educated on tub transfer, when ready and when cleared by MD. Reviewed knee precautions and walker safety     Vision         Perception     Praxis      Pertinent Vitals/Pain Pain Assessment: 0-10 Pain Score: 6  Pain Location: L knee Pain Descriptors / Indicators: Aching Pain Intervention(s): Limited activity within patient's tolerance;Monitored during session;Premedicated before session;Repositioned;Ice applied      Hand Dominance     Extremity/Trunk Assessment Upper Extremity Assessment Upper Extremity Assessment: Overall WFL for tasks assessed           Communication Communication Communication: No difficulties   Cognition Arousal/Alertness: Awake/alert Behavior During Therapy: WFL for tasks assessed/performed Overall Cognitive Status: Within Functional Limits for tasks assessed                                     General Comments       Exercises     Shoulder Instructions      Home Living Family/patient expects to be discharged to:: Private residence Living Arrangements: Spouse/significant other Available Help at Discharge: Family               Bathroom Shower/Tub: Teacher, early years/pre: Handicapped height     Home Equipment: Bedside commode          Prior Functioning/Environment Level of Independence: Independent                 OT Problem List:        OT Treatment/Interventions:      OT Goals(Current goals can be found in the care plan section) Acute Rehab OT Goals Patient Stated Goal: none stated OT  Goal Formulation: All assessment and education complete, DC therapy  OT Frequency:     Barriers to D/C:            Co-evaluation              AM-PAC OT "6 Clicks" Daily Activity     Outcome Measure Help from another person eating meals?: None Help from another person taking care of personal grooming?: A Little Help from another person toileting, which includes using toliet, bedpan, or urinal?: A Little Help from another person bathing (including washing, rinsing, drying)?: A Lot Help from another person to put on and taking off regular upper body clothing?: A Little Help from another person to put on and taking off regular lower body clothing?: A Lot 6 Click Score: 17   End of Session Nurse Communication: (ready for d/c)  Activity Tolerance: Patient tolerated treatment well Patient left: in chair;with  call bell/phone within reach  OT Visit Diagnosis: Pain Pain - Right/Left: Left Pain - part of body: Knee                Time: 2025-4270 OT Time Calculation (min): 21 min Charges:  OT General Charges $OT Visit: 1 Visit OT Evaluation $OT Eval Low Complexity: [place], OTR/L Acute Rehabilitation Services [phone] [facility] pager [phone] office 04/25/2018  [place] 04/25/2018, 11:42 AM

## 2018-04-25 NOTE — Progress Notes (Signed)
    Subjective: Patient reports pain as mild to moderate.  Tolerating diet.  No CP, SOB.  Good early mobilization.  Early postop urinary retention resolved.  Objective:   VITALS:   Vitals:   04/24/18 1929 04/24/18 2029 04/24/18 2148 04/25/18 0134  BP: 115/63 (!) 104/57 110/69 117/64  Pulse: 80 79 82 72  Resp: 16 18 16 20   Temp: 98.1 F (36.7 C) 97.8 F (36.6 C) 97.7 F (36.5 C)   TempSrc:      SpO2: 100% 90% 98% 96%  Weight:      Height:       CBC Latest Ref Rng & Units 04/17/2018 03/29/2018 01/16/2018  WBC 4.0 - 10.5 K/uL 3.9(L) 4.3 4.9  Hemoglobin 13.0 - 17.0 g/dL 13.7 14.5 13.6  Hematocrit 39.0 - 52.0 % 42.9 43.8 42.5  Platelets 150 - 400 K/uL 128(L) 172 171   BMP Latest Ref Rng & Units 04/17/2018 03/29/2018 02/07/2018  Glucose 70 - 99 mg/dL 82 72 87  BUN 8 - 23 mg/dL 22 17 18   Creatinine 0.61 - 1.24 mg/dL 0.95 0.96 1.05  BUN/Creat Ratio 10 - 24 - 18 17  Sodium 135 - 145 mmol/L 138 138 141  Potassium 3.5 - 5.1 mmol/L 4.1 3.7 3.9  Chloride 98 - 111 mmol/L 102 97 101  CO2 22 - 32 mmol/L 28 26 27   Calcium 8.9 - 10.3 mg/dL 9.0 9.2 9.2   Intake/Output      02/04 0701 - 02/05 0700 02/05 0701 - 02/06 0700   P.O. 600    I.V. (mL/kg) 2255.6 (19.3)    IV Piggyback 526.7    Total Intake(mL/kg) 3382.2 (28.9)    Urine (mL/kg/hr) 1825    Stool 0    Blood 100    Total Output 1925    Net +1457.2         Urine Occurrence 0 x    Stool Occurrence 0 x       Physical Exam: General: NAD.  Upright in bed.  Calm, conversant.  Wife at bedside. Resp: No increased wob Cardio: regular rate and rhythm ABD soft Neurologically intact MSK LLE: Neurovascularly intact Sensation intact distally Intact pulses distally Dorsiflexion/Plantar flexion intact Incision: dressing C/D/I   Assessment: 1 Day Post-Op  S/P Procedure(s) (LRB): TOTAL KNEE ARTHROPLASTY (Left) by Dr. Ernesta Amble. Percell Miller on 04/24/2018  Principal Problem:   Primary osteoarthritis of left knee Active Problems:  Anxiety and depression   Essential hypertension, benign   GERD (gastroesophageal reflux disease)   OSA (obstructive sleep apnea)   Primary localized osteoarthritis of knee   Primary osteoarthritis, status post left total knee arthroplasty Doing well postop day 1 Eating, drinking, and voiding Pain controlled Good early mobilization.  Plan: Up with therapy Incentive Spirometry Elevate and Apply ice CPM, bone foam  Weight Bearing: Weight Bearing as Tolerated (WBAT)  Dressings: Maintain Mepilex.   VTE prophylaxis: Aspirin, SCDs, ambulation Dispo: Home today.    Patient's anticipated LOS is less than 2 midnights, meeting these requirements: - Younger than 13 - Lives within 1 hour of care - Has a competent adult at home to recover with post-op recover - NO history of  - Diabetes  - Coronary Artery Disease  - Heart failure  - Heart attack  - Stroke  - DVT/VTE  - Cardiac arrhythmia  - Respiratory Failure/COPD  - Renal failure  - Anemia  - Advanced Liver disease   Prudencio Burly III, PA-C 04/25/2018, 7:48 AM

## 2018-04-25 NOTE — Evaluation (Signed)
Physical Therapy Evaluation Patient Details Name: Jeff Stewart MRN: [id number] DOB: 01-14-1956 Today's Date: 04/25/2018   History of Present Illness  s/p L TKA. H/o multiple back sxs  Clinical Impression  Pt s/p L TKR and presents with functional mobility limitations 2* decreased L LE strength/ROM and post op pain limiting functional mobility.  Pt currently mobilizing at sup/min guard assist and eager for dc home.  Home therex program provided until first [place] visit.    Follow Up Recommendations Follow surgeon's recommendation for DC plan and follow-up therapies    Equipment Recommendations  None recommended by PT    Recommendations for Other Services       Precautions / Restrictions Precautions Precautions: Knee;Fall Restrictions Weight Bearing Restrictions: No      Mobility  Bed Mobility Overal bed mobility: Needs Assistance Bed Mobility: Supine to Sit     Supine to sit: Min guard     General bed mobility comments: min cues for sequence  Transfers Overall transfer level: Needs assistance Equipment used: Rolling walker (2 wheeled) Transfers: Sit to/from Stand Sit to Stand: Min guard;Supervision         General transfer comment: cues for LE management and use of UEs to self assist  Ambulation/Gait Ambulation/Gait assistance: Min guard;Supervision Gait Distance (Feet): 140 Feet Assistive device: Rolling walker (2 wheeled) Gait Pattern/deviations: Decreased step length - right;Decreased step length - left;Step-to pattern;Step-through pattern;Shuffle;Trunk flexed Gait velocity: decr   General Gait Details: cues for posture, position from RW and initial sequence.  Stairs            Wheelchair Mobility    Modified Rankin (Stroke Patients Only)       Balance Overall balance assessment: Mild deficits observed, not formally tested                                           Pertinent Vitals/Pain Pain Assessment: 0-10 Pain Score:  6  Pain Location: L knee Pain Descriptors / Indicators: Aching Pain Intervention(s): Limited activity within patient's tolerance;Monitored during session;Premedicated before session    Home Living Family/patient expects to be discharged to:: Private residence Living Arrangements: Spouse/significant other Available Help at Discharge: Family Type of Home: House Home Access: Stairs to enter Entrance Stairs-Rails: Research scientist (medical) of Steps: 3 Home Layout: One level Home Equipment: Bedside commode      Prior Function Level of Independence: Independent               Hand Dominance        Extremity/Trunk Assessment   Upper Extremity Assessment Upper Extremity Assessment: Overall WFL for tasks assessed    Lower Extremity Assessment Lower Extremity Assessment: LLE deficits/detail LLE Deficits / Details: 3/5 quads with IND SLR; AAROM at knee -5 - 80    Cervical / Trunk Assessment Cervical / Trunk Assessment: Normal  Communication   Communication: No difficulties  Cognition Arousal/Alertness: Awake/alert Behavior During Therapy: WFL for tasks assessed/performed Overall Cognitive Status: Within Functional Limits for tasks assessed                                        General Comments      Exercises Total Joint Exercises Ankle Circles/Pumps: AROM;Both;15 reps;Supine Quad Sets: AROM;Both;10 reps;Supine Heel Slides: AAROM;Left;15 reps;Supine Straight Leg Raises: AAROM;AROM;Left;15 reps;Supine  Assessment/Plan    PT Assessment Patient needs continued PT services  PT Problem List Decreased strength;Decreased range of motion;Decreased activity tolerance;Decreased mobility;Pain;Decreased knowledge of use of DME       PT Treatment Interventions DME instruction;Gait training;Stair training;Functional mobility training;Therapeutic activities;Therapeutic exercise;Patient/family education    PT Goals (Current goals can be found in the  Care Plan section)  Acute Rehab PT Goals Patient Stated Goal: Regain IND PT Goal Formulation: All assessment and education complete, DC therapy    Frequency 7X/week   Barriers to discharge        Co-evaluation               AM-PAC PT "6 Clicks" Mobility  Outcome Measure Help needed turning from your back to your side while in a flat bed without using bedrails?: None Help needed moving from lying on your back to sitting on the side of a flat bed without using bedrails?: A Little Help needed moving to and from a bed to a chair (including a wheelchair)?: A Little Help needed standing up from a chair using your arms (e.g., wheelchair or bedside chair)?: A Little Help needed to walk in hospital room?: A Little Help needed climbing 3-5 steps with a railing? : A Little 6 Click Score: 19    End of Session Equipment Utilized During Treatment: Gait belt Activity Tolerance: Patient tolerated treatment well Patient left: in chair;with call bell/phone within reach;with family/visitor present Nurse Communication: Mobility status PT Visit Diagnosis: Difficulty in walking, not elsewhere classified (R26.2)    Time: 9480-1655 PT Time Calculation (min) (ACUTE ONLY): 42 min   Charges:   PT Evaluation $PT Eval Low Complexity: 1 Low PT Treatments $Gait Training: 8-22 mins $Therapeutic Exercise: 8-22 mins        Debe Coder PT Acute Rehabilitation Services Pager [phone] Office [phone]   Keisi Eckford 04/25/2018, 11:58 AM

## 2018-04-25 NOTE — Anesthesia Postprocedure Evaluation (Signed)
Anesthesia Post Note  Patient: PRABHAV FAULKENBERRY  Procedure(s) Performed: TOTAL KNEE ARTHROPLASTY (Left Knee)     Patient location during evaluation: PACU Anesthesia Type: General Level of consciousness: awake and alert Pain management: pain level controlled Vital Signs Assessment: post-procedure vital signs reviewed and stable Respiratory status: spontaneous breathing, nonlabored ventilation, respiratory function stable and patient connected to nasal cannula oxygen Cardiovascular status: blood pressure returned to baseline and stable Postop Assessment: no apparent nausea or vomiting Anesthetic complications: no    Last Vitals:  Vitals:   04/24/18 2148 04/25/18 0134  BP: 110/69 117/64  Pulse: 82 72  Resp: 16 20  Temp: 36.5 C   SpO2: 98% 96%    Last Pain:  Vitals:   04/24/18 1931  TempSrc:   PainSc: 2                  Seanna Sisler S

## 2018-04-27 DIAGNOSIS — M5030 Other cervical disc degeneration, unspecified cervical region: Secondary | ICD-10-CM | POA: Diagnosis not present

## 2018-04-27 DIAGNOSIS — K219 Gastro-esophageal reflux disease without esophagitis: Secondary | ICD-10-CM | POA: Diagnosis not present

## 2018-04-27 DIAGNOSIS — I10 Essential (primary) hypertension: Secondary | ICD-10-CM | POA: Diagnosis not present

## 2018-04-27 DIAGNOSIS — F329 Major depressive disorder, single episode, unspecified: Secondary | ICD-10-CM | POA: Diagnosis not present

## 2018-04-27 DIAGNOSIS — M5136 Other intervertebral disc degeneration, lumbar region: Secondary | ICD-10-CM | POA: Diagnosis not present

## 2018-04-27 DIAGNOSIS — Z471 Aftercare following joint replacement surgery: Secondary | ICD-10-CM | POA: Diagnosis not present

## 2018-04-27 DIAGNOSIS — F419 Anxiety disorder, unspecified: Secondary | ICD-10-CM | POA: Diagnosis not present

## 2018-04-27 DIAGNOSIS — Z96652 Presence of left artificial knee joint: Secondary | ICD-10-CM | POA: Diagnosis not present

## 2018-04-27 DIAGNOSIS — G4733 Obstructive sleep apnea (adult) (pediatric): Secondary | ICD-10-CM | POA: Diagnosis not present

## 2018-05-01 DIAGNOSIS — Z471 Aftercare following joint replacement surgery: Secondary | ICD-10-CM | POA: Diagnosis not present

## 2018-05-01 DIAGNOSIS — K219 Gastro-esophageal reflux disease without esophagitis: Secondary | ICD-10-CM | POA: Diagnosis not present

## 2018-05-01 DIAGNOSIS — I10 Essential (primary) hypertension: Secondary | ICD-10-CM | POA: Diagnosis not present

## 2018-05-01 DIAGNOSIS — M5030 Other cervical disc degeneration, unspecified cervical region: Secondary | ICD-10-CM | POA: Diagnosis not present

## 2018-05-01 DIAGNOSIS — M5136 Other intervertebral disc degeneration, lumbar region: Secondary | ICD-10-CM | POA: Diagnosis not present

## 2018-05-01 DIAGNOSIS — G4733 Obstructive sleep apnea (adult) (pediatric): Secondary | ICD-10-CM | POA: Diagnosis not present

## 2018-05-03 DIAGNOSIS — M5030 Other cervical disc degeneration, unspecified cervical region: Secondary | ICD-10-CM | POA: Diagnosis not present

## 2018-05-03 DIAGNOSIS — I10 Essential (primary) hypertension: Secondary | ICD-10-CM | POA: Diagnosis not present

## 2018-05-03 DIAGNOSIS — K219 Gastro-esophageal reflux disease without esophagitis: Secondary | ICD-10-CM | POA: Diagnosis not present

## 2018-05-03 DIAGNOSIS — Z471 Aftercare following joint replacement surgery: Secondary | ICD-10-CM | POA: Diagnosis not present

## 2018-05-03 DIAGNOSIS — M5136 Other intervertebral disc degeneration, lumbar region: Secondary | ICD-10-CM | POA: Diagnosis not present

## 2018-05-03 DIAGNOSIS — G4733 Obstructive sleep apnea (adult) (pediatric): Secondary | ICD-10-CM | POA: Diagnosis not present

## 2018-05-04 DIAGNOSIS — K219 Gastro-esophageal reflux disease without esophagitis: Secondary | ICD-10-CM | POA: Diagnosis not present

## 2018-05-04 DIAGNOSIS — M5030 Other cervical disc degeneration, unspecified cervical region: Secondary | ICD-10-CM | POA: Diagnosis not present

## 2018-05-04 DIAGNOSIS — I10 Essential (primary) hypertension: Secondary | ICD-10-CM | POA: Diagnosis not present

## 2018-05-04 DIAGNOSIS — M5136 Other intervertebral disc degeneration, lumbar region: Secondary | ICD-10-CM | POA: Diagnosis not present

## 2018-05-04 DIAGNOSIS — G4733 Obstructive sleep apnea (adult) (pediatric): Secondary | ICD-10-CM | POA: Diagnosis not present

## 2018-05-04 DIAGNOSIS — Z471 Aftercare following joint replacement surgery: Secondary | ICD-10-CM | POA: Diagnosis not present

## 2018-05-08 DIAGNOSIS — I10 Essential (primary) hypertension: Secondary | ICD-10-CM | POA: Diagnosis not present

## 2018-05-08 DIAGNOSIS — G4733 Obstructive sleep apnea (adult) (pediatric): Secondary | ICD-10-CM | POA: Diagnosis not present

## 2018-05-08 DIAGNOSIS — Z471 Aftercare following joint replacement surgery: Secondary | ICD-10-CM | POA: Diagnosis not present

## 2018-05-08 DIAGNOSIS — K219 Gastro-esophageal reflux disease without esophagitis: Secondary | ICD-10-CM | POA: Diagnosis not present

## 2018-05-08 DIAGNOSIS — M5136 Other intervertebral disc degeneration, lumbar region: Secondary | ICD-10-CM | POA: Diagnosis not present

## 2018-05-08 DIAGNOSIS — M5030 Other cervical disc degeneration, unspecified cervical region: Secondary | ICD-10-CM | POA: Diagnosis not present

## 2018-05-09 ENCOUNTER — Encounter: Payer: Self-pay | Admitting: Physical Therapy

## 2018-05-09 ENCOUNTER — Ambulatory Visit: Payer: Medicare Other | Attending: Orthopedic Surgery | Admitting: Physical Therapy

## 2018-05-09 ENCOUNTER — Other Ambulatory Visit: Payer: Self-pay

## 2018-05-09 DIAGNOSIS — M25662 Stiffness of left knee, not elsewhere classified: Secondary | ICD-10-CM | POA: Diagnosis not present

## 2018-05-09 DIAGNOSIS — M25562 Pain in left knee: Secondary | ICD-10-CM | POA: Diagnosis not present

## 2018-05-09 DIAGNOSIS — M1712 Unilateral primary osteoarthritis, left knee: Secondary | ICD-10-CM | POA: Diagnosis not present

## 2018-05-09 NOTE — Therapy (Signed)
[facility] [place], [place], [address] Phone: [phone]   Fax:  [phone]  Physical Therapy Evaluation  Patient Details  Name: Jeff Stewart MRN: [id number] Date of Birth: August 27, 1955 Referring Provider (PT): Edmonia Lynch, MD   Encounter Date: 05/09/2018  PT End of Session - 05/09/18 1421    Visit Number  1    Number of Visits  12    Date for PT Re-Evaluation  06/27/18    Authorization Type  FOTO, progress note every 10th visit    PT Start Time  1300    PT Stop Time  1343    PT Time Calculation (min)  43 min    Activity Tolerance  Patient tolerated treatment well    Behavior During Therapy  [facility] for tasks assessed/performed       Past Medical History:  Diagnosis Date  . Anxiety   . Carpal tunnel syndrome, bilateral   . Cataract    removed years ago  . Chronic back pain    internal morphine pump  . DDD (degenerative disc disease)    neck, lumbar  . Depression   . Dyslipidemia    diet controlled  . Hypertension    borderline  . Sleep apnea    wears C-PAP  . Status post insertion of spinal cord stimulator     Past Surgical History:  Procedure Laterality Date  . BACK SURGERY     x 6  . CARPAL TUNNEL RELEASE     bilateral  . COLONOSCOPY    . ELBOW SURGERY    . INGUINAL HERNIA REPAIR Right   . INTERNAL MORPHINE PUMP     . KNEE ARTHROSCOPY    . NASAL SEPTUM SURGERY     x 6  . neck fusion     . NISSEN FUNDOPLICATION    . SPINAL CORD STIMULATOR INSERTION    . STOMACH SURGERY    . TOTAL KNEE ARTHROPLASTY Left 04/24/2018   Procedure: TOTAL KNEE ARTHROPLASTY;  Surgeon: Renette Butters, MD;  Location: [facility];  Service: Orthopedics;  Laterality: Left;  . UPPER GASTROINTESTINAL ENDOSCOPY      There were no vitals filed for this visit.   Subjective Assessment - 05/09/18 1411    Subjective  Patient arrives to physical therapy with reports of left knee pain, stiffness, and difficulty walking due to  a left total knee replacement on 04/24/2018. Patient reports having home health physical therapy for two weeks and has been discharged. Patient is compliant with all HEP and has been using CPM 3-4x/day for about 2 hours. Patient can perform all basic ADLs independently but does need assistance with donning compression stockings from his wife. Patient's pain at worst is 9/10 and states he mainly feels that at night and due to the pain, he has difficulties with sleeping. Patient's pain at best is 3/10 with pain medication and exercise. Patient's goals are to decrease pain, improve movement, improve strength and improve ability to sleep throughout the night and perform home activities.    Pertinent History  left TKA 04/24/2018, HTN    Limitations  Sitting;House hold activities;Standing;Walking    Diagnostic tests  x-ray & MRI    Patient Stated Goals  decrease pain, get moving more, walk without a cane.    Currently in Pain?  Yes    Pain Score  5     Pain Location  Knee    Pain Orientation  Left    Pain Descriptors / Indicators  Aching;Tightness    Pain Type  Surgical pain    Pain Onset  1 to 4 weeks ago    Pain Frequency  Intermittent    Aggravating Factors   walking, bending    Pain Relieving Factors  medications, exercise    Effect of Pain on Daily Activities  minimal         [facility] PT Assessment - 05/09/18 0001      Assessment   Medical Diagnosis  Left total knee replacement    Referring Provider (PT)  Edmonia Lynch, MD    Onset Date/Surgical Date  04/24/18    Next MD Visit  05/09/2018    Prior Therapy  home therapy      Precautions   Precautions  Other (comment)    Precaution Comments  Spinal cord stimulator, morphine pump      Restrictions   Weight Bearing Restrictions  No      Balance Screen   Has the patient fallen in the past 6 months  No    Has the patient had a decrease in activity level because of a fear of falling?   Yes    Is the patient reluctant to leave their home  because of a fear of falling?   No      Home Film/video editor residence    Living Arrangements  Spouse/significant other      Prior Function   Level of Independence  Independent with basic ADLs      Observation/Other Assessments   Observations  compressions stockings donned    Focus on Therapeutic Outcomes (FOTO)   55% limited      ROM / Strength   AROM / PROM / Strength  AROM;PROM;Strength      AROM   Overall AROM   Deficits    AROM Assessment Site  Knee    Right/Left Knee  Left    Left Knee Extension  -3    Left Knee Flexion  102      PROM   Overall PROM   Deficits    PROM Assessment Site  Knee    Right/Left Knee  Left    Left Knee Extension  0    Left Knee Flexion  106      Strength   Strength Assessment Site  Knee    Right/Left Knee  Left    Left Knee Flexion  4/5    Left Knee Extension  4/5      Palpation   Patella mobility  WFL 3/6       Transfers   Transfers  Independent with all Transfers      Ambulation/Gait   Assistive device  Straight cane    Gait Pattern  Step-through pattern;Decreased stance time - left    Ambulation Surface  Level;Indoor                Objective measurements completed on examination: See above findings.      [facility] Adult PT Treatment/Exercise - 05/09/18 0001      Modalities   Modalities  Vasopneumatic      Vasopneumatic   Number Minutes Vasopneumatic   15 minutes    Vasopnuematic Location   Knee    Vasopneumatic Pressure  Low    Vasopneumatic Temperature   57             PT Education - 05/09/18 1417    Education Details  Hip adduction, LAQ, heel slides    Person(s) Educated  Patient    Methods  Explanation;Handout;Demonstration    Comprehension  Verbalized understanding;Returned demonstration          PT Long Term Goals - 05/09/18 1428      PT LONG TERM GOAL #1   Title  Patient will be independent with HEP and its progression    Time  6    Period  Weeks    Status   New      PT LONG TERM GOAL #2   Title  Patient will demonstrate 120+ degrees of left knee AROM flexion to improve ability to perform functional activities.    Time  6    Period  Weeks    Status  New      PT LONG TERM GOAL #3   Title  Patient will demonstrate 0 egrees of left knee AROM extension to improve gait mechanics.    Time  6    Period  Weeks    Status  New      PT LONG TERM GOAL #4   Title  Patient will report ability to perform ADLs independently with left knee pain with equal to or less than 3/10.    Time  6    Period  Weeks    Status  New      PT LONG TERM GOAL #5   Title  Patient will negotiate 3 steps with reciprocating gait pattern and one rail to safely enter and exit home.    Time  6    Period  Weeks    Status  New      Additional Long Term Goals   Additional Long Term Goals  Yes      PT LONG TERM GOAL #6   Title  Patient will ambulate community distances without an AD and less than 3/10 left knee pain.     Time  6    Period  Weeks    Status  New             Plan - 05/09/18 1422    Clinical Impression Statement  Patient is a 63 year old male who presents to physical therapy with decreased left ROM, decreased left LE strength, and increased edema due to a left total knee replacement on 04/24/2018. Patient is independent with transitions and bed mobility. Patient ambulates with a straight cane with step through step pattern and decreased stance time on left LE. Patient and PT reviewed plan of care as well as HEP to which patient reported understanding and agreement.  Patient would benefit from skilled physical therapy to address deficits and address patient's goals.     History and Personal Factors relevant to plan of care:  L TKA 04/24/2018, HTN    Clinical Presentation  Stable    Clinical Decision Making  Low    Rehab Potential  Excellent    PT Frequency  2x / week    PT Duration  6 weeks    PT Treatment/Interventions  ADLs/Self Care Home  Management;Cryotherapy;Ultrasound;Moist Heat;Fluidtherapy;Therapeutic activities;Therapeutic exercise;Gait training;Stair training;Functional mobility training;Neuromuscular re-education;Manual techniques;Patient/family education;Balance training;Vasopneumatic Device;Taping;Passive range of motion    PT Next Visit Plan  Nustep, left knee AROM, PROM, vaso only (patient has a spinal cord stimulator)    PT Home Exercise Plan  see patient education section    Consulted and Agree with Plan of Care  Patient       Patient will benefit from skilled therapeutic intervention in order to improve the following deficits and impairments:  Pain,  Decreased activity tolerance, Decreased endurance, Decreased range of motion, Decreased strength, Decreased balance, Difficulty walking  Visit Diagnosis: Acute pain of left knee - Plan: PT plan of care cert/re-cert  Stiffness of left knee, not elsewhere classified - Plan: PT plan of care cert/re-cert     Problem List Patient Active Problem List   Diagnosis Date Noted  . Primary localized osteoarthritis of knee 04/24/2018  . Primary osteoarthritis of left knee 04/02/2018  . OSA (obstructive sleep apnea) 04/02/2018  . GERD (gastroesophageal reflux disease) 02/07/2018  . Abdominal pain, epigastric 11/04/2015  . Nausea without vomiting 11/04/2015  . Rectal discomfort 11/04/2015  . Hemorrhoid 08/04/2015  . Dyspnea 06/10/2015  . Hypogonadism in male 04/03/2015  . Essential hypertension, benign 03/04/2015  . Anxiety and depression 02/23/2015  . Chronic back pain 01/14/2015  . Atypical chest pain 06/14/2013   Gabriela Eves, PT, DPT 05/09/2018, 2:35 PM  [facility] [address] [place], [place], [address] Phone: [phone]   Fax:  [phone]  Name: Jeff Stewart MRN: [id number] Date of Birth: 01-10-1956

## 2018-05-10 DIAGNOSIS — M79605 Pain in left leg: Secondary | ICD-10-CM | POA: Diagnosis not present

## 2018-05-10 DIAGNOSIS — M79662 Pain in left lower leg: Secondary | ICD-10-CM | POA: Diagnosis not present

## 2018-05-10 DIAGNOSIS — M7989 Other specified soft tissue disorders: Secondary | ICD-10-CM | POA: Diagnosis not present

## 2018-05-14 ENCOUNTER — Encounter: Payer: Self-pay | Admitting: Physical Therapy

## 2018-05-14 ENCOUNTER — Ambulatory Visit: Payer: Medicare Other | Admitting: Physical Therapy

## 2018-05-14 DIAGNOSIS — M25662 Stiffness of left knee, not elsewhere classified: Secondary | ICD-10-CM | POA: Diagnosis not present

## 2018-05-14 DIAGNOSIS — M25562 Pain in left knee: Secondary | ICD-10-CM | POA: Diagnosis not present

## 2018-05-14 NOTE — Therapy (Signed)
[facility] [place], [place], [address] Phone: [phone]   Fax:  [phone]  Physical Therapy Treatment  Patient Details  Name: Jeff CREAR MRN: [id number] Date of Birth: [birth year] Referring Provider (PT): Edmonia Lynch, MD   Encounter Date: 05/14/2018  PT End of Session - 05/14/18 1034    Visit Number  2    Number of Visits  12    Date for PT Re-Evaluation  06/27/18    Authorization Type  FOTO, progress note every 10th visit    PT Start Time  0945    PT Stop Time  1042    PT Time Calculation (min)  57 min    Activity Tolerance  Patient tolerated treatment well    Behavior During Therapy  [facility] for tasks assessed/performed       Past Medical History:  Diagnosis Date  . Anxiety   . Carpal tunnel syndrome, bilateral   . Cataract    removed years ago  . Chronic back pain    internal morphine pump  . DDD (degenerative disc disease)    neck, lumbar  . Depression   . Dyslipidemia    diet controlled  . Hypertension    borderline  . Sleep apnea    wears C-PAP  . Status post insertion of spinal cord stimulator     Past Surgical History:  Procedure Laterality Date  . BACK SURGERY     x 6  . CARPAL TUNNEL RELEASE     bilateral  . COLONOSCOPY    . ELBOW SURGERY    . INGUINAL HERNIA REPAIR Right   . INTERNAL MORPHINE PUMP     . KNEE ARTHROSCOPY    . NASAL SEPTUM SURGERY     x 6  . neck fusion     . NISSEN FUNDOPLICATION    . SPINAL CORD STIMULATOR INSERTION    . STOMACH SURGERY    . TOTAL KNEE ARTHROPLASTY Left 04/24/2018   Procedure: TOTAL KNEE ARTHROPLASTY;  Surgeon: Renette Butters, MD;  Location: [facility];  Service: Orthopedics;  Laterality: Left;  . UPPER GASTROINTESTINAL ENDOSCOPY      There were no vitals filed for this visit.  Subjective Assessment - 05/14/18 0950    Subjective  Reports feeling stiff this morning.     Pertinent History  left TKA 04/24/2018, HTN    Limitations   Sitting;House hold activities;Standing;Walking    Diagnostic tests  x-ray & MRI    Patient Stated Goals  decrease pain, get moving more, walk without a cane.    Currently in Pain?  Yes    Pain Score  5     Pain Location  Knee    Pain Orientation  Left    Pain Descriptors / Indicators  Tightness;Aching    Pain Type  Surgical pain    Pain Onset  1 to 4 weeks ago    Pain Frequency  Intermittent         [facility] PT Assessment - 05/14/18 0001      Assessment   Medical Diagnosis  Left total knee replacement    Referring Provider (PT)  Edmonia Lynch, MD    Onset Date/Surgical Date  04/24/18    Next MD Visit  05/09/2018    Prior Therapy  home therapy      Precautions   Precautions  Other (comment)    Precaution Comments  Spinal cord stimulator, morphine pump      Restrictions   Weight Bearing  Restrictions  No                   [facility] Adult PT Treatment/Exercise - 05/14/18 0001      Exercises   Exercises  Knee/Hip      Knee/Hip Exercises: Stretches   Knee: Self-Stretch to increase Flexion  Left;Other (comment)   3 minutes     Knee/Hip Exercises: Aerobic   Nustep  Level 4 x15 mins moving seat forward from 12 to 9      Knee/Hip Exercises: Standing   Rocker Board  3 minutes      Knee/Hip Exercises: Seated   Clamshell with TheraBand  Red   x20   Hamstring Curl  Strengthening;Left;20 reps    Hamstring Limitations  red      Modalities   Modalities  Vasopneumatic      Vasopneumatic   Number Minutes Vasopneumatic   15 minutes    Vasopnuematic Location   Knee    Vasopneumatic Pressure  Low    Vasopneumatic Temperature   57      Manual Therapy   Manual Therapy  Passive ROM    Passive ROM  PROM to left knee to improve ROM, oscillations intermittently to promote muscle relaxation                  PT Long Term Goals - 05/09/18 1428      PT LONG TERM GOAL #1   Title  Patient will be independent with HEP and its progression    Time  6    Period   Weeks    Status  New      PT LONG TERM GOAL #2   Title  Patient will demonstrate 120+ degrees of left knee AROM flexion to improve ability to perform functional activities.    Time  6    Period  Weeks    Status  New      PT LONG TERM GOAL #3   Title  Patient will demonstrate 0 egrees of left knee AROM extension to improve gait mechanics.    Time  6    Period  Weeks    Status  New      PT LONG TERM GOAL #4   Title  Patient will report ability to perform ADLs independently with left knee pain with equal to or less than 3/10.    Time  6    Period  Weeks    Status  New      PT LONG TERM GOAL #5   Title  Patient will negotiate 3 steps with reciprocating gait pattern and one rail to safely enter and exit home.    Time  6    Period  Weeks    Status  New      Additional Long Term Goals   Additional Long Term Goals  Yes      PT LONG TERM GOAL #6   Title  Patient will ambulate community distances without an AD and less than 3/10 left knee pain.     Time  6    Period  Weeks    Status  New            Plan - 05/14/18 1034    Clinical Impression Statement  Patient was able to tolerate treatment well despite stiffness at start of session. Patient noted with good form after demonstration. Patient provided with intermittent oscillations to left knee to prevent muscle guarding. Normal response to vaso upon removal.  Clinical Presentation  Stable    Clinical Decision Making  Low    Rehab Potential  Excellent    PT Frequency  2x / week    PT Duration  6 weeks    PT Treatment/Interventions  ADLs/Self Care Home Management;Cryotherapy;Ultrasound;Moist Heat;Fluidtherapy;Therapeutic activities;Therapeutic exercise;Gait training;Stair training;Functional mobility training;Neuromuscular re-education;Manual techniques;Patient/family education;Balance training;Vasopneumatic Device;Taping;Passive range of motion    PT Next Visit Plan  Nustep, left knee AROM, PROM, vaso only (patient has a  spinal cord stimulator)    Consulted and Agree with Plan of Care  Patient       Patient will benefit from skilled therapeutic intervention in order to improve the following deficits and impairments:  Pain, Decreased activity tolerance, Decreased endurance, Decreased range of motion, Decreased strength, Decreased balance, Difficulty walking  Visit Diagnosis: Acute pain of left knee  Stiffness of left knee, not elsewhere classified     Problem List Patient Active Problem List   Diagnosis Date Noted  . Primary localized osteoarthritis of knee 04/24/2018  . Primary osteoarthritis of left knee 04/02/2018  . OSA (obstructive sleep apnea) 04/02/2018  . GERD (gastroesophageal reflux disease) 02/07/2018  . Abdominal pain, epigastric 11/04/2015  . Nausea without vomiting 11/04/2015  . Rectal discomfort 11/04/2015  . Hemorrhoid 08/04/2015  . Dyspnea 06/10/2015  . Hypogonadism in male 04/03/2015  . Essential hypertension, benign 03/04/2015  . Anxiety and depression 02/23/2015  . Chronic back pain 01/14/2015  . Atypical chest pain 06/14/2013    Gabriela Eves, PT, DPT 05/14/2018, 10:44 AM  [facility] [address] [place], [place], [address] Phone: [phone]   Fax:  [phone]  Name: CATCHER DEHOYOS MRN: [id number] Date of Birth: 01-27-1956

## 2018-05-16 ENCOUNTER — Encounter: Payer: Self-pay | Admitting: Physical Therapy

## 2018-05-16 ENCOUNTER — Ambulatory Visit: Payer: Medicare Other | Admitting: Physical Therapy

## 2018-05-16 DIAGNOSIS — M25562 Pain in left knee: Secondary | ICD-10-CM

## 2018-05-16 DIAGNOSIS — M25662 Stiffness of left knee, not elsewhere classified: Secondary | ICD-10-CM | POA: Diagnosis not present

## 2018-05-16 NOTE — Therapy (Signed)
[facility] [place], [place], [address] Phone: [phone]   Fax:  [phone]  Physical Therapy Treatment  Patient Details  Name: Jeff Stewart MRN: [id number] Date of Birth: Mar 09, 1956 Referring Provider (PT): Edmonia Lynch, MD   Encounter Date: 05/16/2018  PT End of Session - 05/16/18 0948    Visit Number  3    Number of Visits  12    Date for PT Re-Evaluation  06/27/18    Authorization Type  FOTO, progress note every 10th visit    PT Start Time  0946    PT Stop Time  1033    PT Time Calculation (min)  47 min    Activity Tolerance  Patient tolerated treatment well    Behavior During Therapy  [facility] for tasks assessed/performed       Past Medical History:  Diagnosis Date  . Anxiety   . Carpal tunnel syndrome, bilateral   . Cataract    removed years ago  . Chronic back pain    internal morphine pump  . DDD (degenerative disc disease)    neck, lumbar  . Depression   . Dyslipidemia    diet controlled  . Hypertension    borderline  . Sleep apnea    wears C-PAP  . Status post insertion of spinal cord stimulator     Past Surgical History:  Procedure Laterality Date  . BACK SURGERY     x 6  . CARPAL TUNNEL RELEASE     bilateral  . COLONOSCOPY    . ELBOW SURGERY    . INGUINAL HERNIA REPAIR Right   . INTERNAL MORPHINE PUMP     . KNEE ARTHROSCOPY    . NASAL SEPTUM SURGERY     x 6  . neck fusion     . NISSEN FUNDOPLICATION    . SPINAL CORD STIMULATOR INSERTION    . STOMACH SURGERY    . TOTAL KNEE ARTHROPLASTY Left 04/24/2018   Procedure: TOTAL KNEE ARTHROPLASTY;  Surgeon: Renette Butters, MD;  Location: [facility];  Service: Orthopedics;  Laterality: Left;  . UPPER GASTROINTESTINAL ENDOSCOPY      There were no vitals filed for this visit.  Subjective Assessment - 05/16/18 0948    Subjective  Reports feeling sore.    Pertinent History  left TKA 04/24/2018, HTN    Limitations  Sitting;House hold  activities;Standing;Walking    Diagnostic tests  x-ray & MRI    Patient Stated Goals  decrease pain, get moving more, walk without a cane.    Currently in Pain?  Yes    Pain Score  4     Pain Location  Knee    Pain Orientation  Left    Pain Descriptors / Indicators  Sore    Pain Type  Surgical pain    Pain Onset  1 to 4 weeks ago         [facility] PT Assessment - 05/16/18 0001      Assessment   Medical Diagnosis  Left total knee replacement    Referring Provider (PT)  Edmonia Lynch, MD    Onset Date/Surgical Date  04/24/18    Next MD Visit  06/06/2018    Prior Therapy  home therapy      Precautions   Precautions  Other (comment)    Precaution Comments  Spinal cord stimulator, morphine pump      Restrictions   Weight Bearing Restrictions  No      Observation/Other Assessments-Edema  Edema  Circumferential      Circumferential Edema   Circumferential - Right  45.6 cm    Circumferential - Left   50.7 cm      ROM / Strength   AROM / PROM / Strength  AROM      AROM   Overall AROM   Deficits    AROM Assessment Site  Knee    Right/Left Knee  Left    Left Knee Extension  3    Left Knee Flexion  117                   [facility] Adult PT Treatment/Exercise - 05/16/18 0001      Knee/Hip Exercises: Aerobic   Nustep  L4 x15 min      Knee/Hip Exercises: Standing   Forward Lunges  Left;20 reps;2 seconds    Hip Abduction  AROM;Left;20 reps;Knee straight    Functional Squat  20 reps    Rocker Board  2 minutes      Knee/Hip Exercises: Seated   Long Arc Quad  Strengthening;Left;20 reps;Weights    Long Arc Quad Weight  3 lbs.      Knee/Hip Exercises: Supine   Straight Leg Raises  AROM;Left;20 reps      Modalities   Modalities  Vasopneumatic      Vasopneumatic   Number Minutes Vasopneumatic   15 minutes    Vasopnuematic Location   Knee    Vasopneumatic Pressure  Low    Vasopneumatic Temperature   57                  PT Long Term Goals - 05/16/18  1030      PT LONG TERM GOAL #1   Title  Patient will be independent with HEP and its progression    Time  6    Period  Weeks    Status  On-going      PT LONG TERM GOAL #2   Title  Patient will demonstrate 120+ degrees of left knee AROM flexion to improve ability to perform functional activities.    Time  6    Period  Weeks    Status  On-going      PT LONG TERM GOAL #3   Title  Patient will demonstrate 0 egrees of left knee AROM extension to improve gait mechanics.    Time  6    Period  Weeks    Status  On-going      PT LONG TERM GOAL #4   Title  Patient will report ability to perform ADLs independently with left knee pain with equal to or less than 3/10.    Time  6    Period  Weeks    Status  On-going      PT LONG TERM GOAL #5   Title  Patient will negotiate 3 steps with reciprocating gait pattern and one rail to safely enter and exit home.    Time  6    Period  Weeks    Status  On-going      PT LONG TERM GOAL #6   Title  Patient will ambulate community distances without an AD and less than 3/10 left knee pain.     Time  6    Period  Weeks    Status  On-going            Plan - 05/16/18 1026    Clinical Impression Statement  Patient presented in clinic with complaints of  only soreness. Patient able to complete all exercises as directed without complaint of any pain and good L quad strength with very minimal extensor lag noted in SLR. Edema measurement assessed with 5.1 cm difference L > R. Patient encouraged to elevate L knee above his heart and ice throughout the day for 15-20 min to continue reducing edema. AROM of L knee measured as 3-117 deg today in supine. TED hose donned to LLE during treatment. Normal modalities response noted following removal of the modalities.    Rehab Potential  Excellent    PT Frequency  2x / week    PT Duration  6 weeks    PT Treatment/Interventions  ADLs/Self Care Home Management;Cryotherapy;Ultrasound;Moist  Heat;Fluidtherapy;Therapeutic activities;Therapeutic exercise;Gait training;Stair training;Functional mobility training;Neuromuscular re-education;Manual techniques;Patient/family education;Balance training;Vasopneumatic Device;Taping;Passive range of motion    PT Next Visit Plan  Continue with L knee ROM and strengthening exercises with only vaso (spinal cord stimulator) per POC.    PT Home Exercise Plan  see patient education section    Consulted and Agree with Plan of Care  Patient       Patient will benefit from skilled therapeutic intervention in order to improve the following deficits and impairments:  Pain, Decreased activity tolerance, Decreased endurance, Decreased range of motion, Decreased strength, Decreased balance, Difficulty walking  Visit Diagnosis: Acute pain of left knee  Stiffness of left knee, not elsewhere classified     Problem List Patient Active Problem List   Diagnosis Date Noted  . Primary localized osteoarthritis of knee 04/24/2018  . Primary osteoarthritis of left knee 04/02/2018  . OSA (obstructive sleep apnea) 04/02/2018  . GERD (gastroesophageal reflux disease) 02/07/2018  . Abdominal pain, epigastric 11/04/2015  . Nausea without vomiting 11/04/2015  . Rectal discomfort 11/04/2015  . Hemorrhoid 08/04/2015  . Dyspnea 06/10/2015  . Hypogonadism in male 04/03/2015  . Essential hypertension, benign 03/04/2015  . Anxiety and depression 02/23/2015  . Chronic back pain 01/14/2015  . Atypical chest pain 06/14/2013    Standley Brooking, PTA 05/16/2018, 10:34 AM  [facility] [address] [place], [place], [address] Phone: [phone]   Fax:  [phone]  Name: Jeff Stewart MRN: [id number] Date of Birth: February 27, 1956

## 2018-05-21 ENCOUNTER — Ambulatory Visit: Payer: Medicare Other | Attending: Orthopedic Surgery | Admitting: Physical Therapy

## 2018-05-21 ENCOUNTER — Encounter: Payer: Self-pay | Admitting: Physical Therapy

## 2018-05-21 ENCOUNTER — Telehealth: Payer: Self-pay | Admitting: "Endocrinology

## 2018-05-21 ENCOUNTER — Telehealth: Payer: Self-pay

## 2018-05-21 ENCOUNTER — Other Ambulatory Visit: Payer: Medicare Other

## 2018-05-21 DIAGNOSIS — M25562 Pain in left knee: Secondary | ICD-10-CM | POA: Diagnosis not present

## 2018-05-21 DIAGNOSIS — M25662 Stiffness of left knee, not elsewhere classified: Secondary | ICD-10-CM | POA: Diagnosis not present

## 2018-05-21 DIAGNOSIS — E291 Testicular hypofunction: Secondary | ICD-10-CM | POA: Diagnosis not present

## 2018-05-21 NOTE — Telephone Encounter (Signed)
Labs ordered.

## 2018-05-21 NOTE — Therapy (Signed)
[facility] [place], [place], [address] Phone: [phone]   Fax:  [phone]  Physical Therapy Treatment  Patient Details  Name: Jeff Stewart MRN: [id number] Date of Birth: 26-Jul-1955 Referring Provider (PT): Edmonia Lynch, MD   Encounter Date: 05/21/2018  PT End of Session - 05/21/18 0951    Visit Number  4    Number of Visits  12    Date for PT Re-Evaluation  06/27/18    Authorization Type  FOTO, progress note every 10th visit    PT Start Time  0945    Activity Tolerance  Patient tolerated treatment well    Behavior During Therapy  [facility] for tasks assessed/performed       Past Medical History:  Diagnosis Date  . Anxiety   . Carpal tunnel syndrome, bilateral   . Cataract    removed years ago  . Chronic back pain    internal morphine pump  . DDD (degenerative disc disease)    neck, lumbar  . Depression   . Dyslipidemia    diet controlled  . Hypertension    borderline  . Sleep apnea    wears C-PAP  . Status post insertion of spinal cord stimulator     Past Surgical History:  Procedure Laterality Date  . BACK SURGERY     x 6  . CARPAL TUNNEL RELEASE     bilateral  . COLONOSCOPY    . ELBOW SURGERY    . INGUINAL HERNIA REPAIR Right   . INTERNAL MORPHINE PUMP     . KNEE ARTHROSCOPY    . NASAL SEPTUM SURGERY     x 6  . neck fusion     . NISSEN FUNDOPLICATION    . SPINAL CORD STIMULATOR INSERTION    . STOMACH SURGERY    . TOTAL KNEE ARTHROPLASTY Left 04/24/2018   Procedure: TOTAL KNEE ARTHROPLASTY;  Surgeon: Renette Butters, MD;  Location: [facility];  Service: Orthopedics;  Laterality: Left;  . UPPER GASTROINTESTINAL ENDOSCOPY      There were no vitals filed for this visit.  Subjective Assessment - 05/21/18 0950    Subjective  "i'm feeling tight this morning."    Pertinent History  left TKA 04/24/2018, HTN    Limitations  Sitting;House hold activities;Standing;Walking    Diagnostic tests  x-ray &  MRI    Patient Stated Goals  decrease pain, get moving more, walk without a cane.    Currently in Pain?  Yes    Pain Score  4     Pain Location  Knee    Pain Orientation  Left    Pain Descriptors / Indicators  Sore;Tightness    Pain Type  Surgical pain    Pain Onset  1 to 4 weeks ago    Pain Frequency  Intermittent         [facility] PT Assessment - 05/21/18 0001      Assessment   Medical Diagnosis  Left total knee replacement    Referring Provider (PT)  Edmonia Lynch, MD    Onset Date/Surgical Date  04/24/18    Next MD Visit  06/06/2018    Prior Therapy  home therapy      Precautions   Precautions  Other (comment)    Precaution Comments  Spinal cord stimulator, morphine pump      Restrictions   Weight Bearing Restrictions  No  [facility] Adult PT Treatment/Exercise - 05/21/18 0001      Knee/Hip Exercises: Aerobic   Nustep  L5 x15 min      Knee/Hip Exercises: Standing   Hip Flexion  AROM;Right;2 sets;10 reps    Rocker Board  3 minutes      Knee/Hip Exercises: Seated   Long Arc Quad  Strengthening;Left;20 reps;Weights    Long Arc Quad Weight  3 lbs.    Hamstring Curl  Strengthening;Left;20 reps    Hamstring Limitations  green      Knee/Hip Exercises: Supine   Short Arc Quad Sets  Strengthening;Left;3 sets;10 reps    Short Arc Quad Sets Limitations  3# weight    Straight Leg Raises  AROM;Left;3 sets;10 reps    Straight Leg Raise with External Rotation  AROM;Left;3 sets;10 reps      Modalities   Modalities  Vasopneumatic      Vasopneumatic   Number Minutes Vasopneumatic   15 minutes    Vasopnuematic Location   Knee    Vasopneumatic Pressure  Low    Vasopneumatic Temperature   57                  PT Long Term Goals - 05/16/18 1030      PT LONG TERM GOAL #1   Title  Patient will be independent with HEP and its progression    Time  6    Period  Weeks    Status  On-going      PT LONG TERM GOAL #2   Title  Patient will  demonstrate 120+ degrees of left knee AROM flexion to improve ability to perform functional activities.    Time  6    Period  Weeks    Status  On-going      PT LONG TERM GOAL #3   Title  Patient will demonstrate 0 egrees of left knee AROM extension to improve gait mechanics.    Time  6    Period  Weeks    Status  On-going      PT LONG TERM GOAL #4   Title  Patient will report ability to perform ADLs independently with left knee pain with equal to or less than 3/10.    Time  6    Period  Weeks    Status  On-going      PT LONG TERM GOAL #5   Title  Patient will negotiate 3 steps with reciprocating gait pattern and one rail to safely enter and exit home.    Time  6    Period  Weeks    Status  On-going      PT LONG TERM GOAL #6   Title  Patient will ambulate community distances without an AD and less than 3/10 left knee pain.     Time  6    Period  Weeks    Status  On-going            Plan - 05/21/18 1025    Clinical Impression Statement  Patient was able to tolerate treatment well with progression of exercises with no reports of increased pain. Patient denied muscle fatigue, soreness, or pain throughout session. Patient compliant with HEP as well as icing and elevating for edema control. Normal response to vasopneumatic device at end of session.     Stability/Clinical Decision Making  Stable/Uncomplicated    Clinical Decision Making  Low    Rehab Potential  Excellent    PT Frequency  2x /  week    PT Duration  6 weeks    PT Treatment/Interventions  ADLs/Self Care Home Management;Cryotherapy;Ultrasound;Moist Heat;Fluidtherapy;Therapeutic activities;Therapeutic exercise;Gait training;Stair training;Functional mobility training;Neuromuscular re-education;Manual techniques;Patient/family education;Balance training;Vasopneumatic Device;Taping;Passive range of motion    PT Next Visit Plan  Attempt knee extension/knee flexion machine if tolerable. Continue with L knee ROM and  strengthening exercises with only vaso (spinal cord stimulator) per POC.    PT Home Exercise Plan  see patient education section    Consulted and Agree with Plan of Care  Patient       Patient will benefit from skilled therapeutic intervention in order to improve the following deficits and impairments:  Pain, Decreased activity tolerance, Decreased endurance, Decreased range of motion, Decreased strength, Decreased balance, Difficulty walking  Visit Diagnosis: Acute pain of left knee  Stiffness of left knee, not elsewhere classified     Problem List Patient Active Problem List   Diagnosis Date Noted  . Primary localized osteoarthritis of knee 04/24/2018  . Primary osteoarthritis of left knee 04/02/2018  . OSA (obstructive sleep apnea) 04/02/2018  . GERD (gastroesophageal reflux disease) 02/07/2018  . Abdominal pain, epigastric 11/04/2015  . Nausea without vomiting 11/04/2015  . Rectal discomfort 11/04/2015  . Hemorrhoid 08/04/2015  . Dyspnea 06/10/2015  . Hypogonadism in male 04/03/2015  . Essential hypertension, benign 03/04/2015  . Anxiety and depression 02/23/2015  . Chronic back pain 01/14/2015  . Atypical chest pain 06/14/2013   Gabriela Eves, PT, DPT 05/21/2018, 11:58 AM  [facility] [address] [place], [place], [address] Phone: [phone]   Fax:  [phone]  Name: Jeff Stewart MRN: [id number] Date of Birth: 07-Sep-1955

## 2018-05-21 NOTE — Telephone Encounter (Signed)
Please change lab orders from [facility] to [facility]

## 2018-05-21 NOTE — Telephone Encounter (Signed)
Labs changed from [facility] to [facility]

## 2018-05-22 LAB — CMP14+EGFR
ALT: 22 IU/L (ref 0–44)
AST: 14 IU/L (ref 0–40)
Albumin/Globulin Ratio: 1.9 (ref 1.2–2.2)
Albumin: 4.1 g/dL (ref 3.8–4.8)
Alkaline Phosphatase: 119 IU/L — ABNORMAL HIGH (ref 39–117)
BUN/Creatinine Ratio: 18 (ref 10–24)
BUN: 16 mg/dL (ref 8–27)
Bilirubin Total: 0.4 mg/dL (ref 0.0–1.2)
CO2: 29 mmol/L (ref 20–29)
Calcium: 9.2 mg/dL (ref 8.6–10.2)
Chloride: 97 mmol/L (ref 96–106)
Creatinine, Ser: 0.9 mg/dL (ref 0.76–1.27)
GFR calc Af Amer: 103 mL/min/{1.73_m2} (ref >59)
GFR calc non Af Amer: 89 mL/min/{1.73_m2} (ref >59)
Globulin, Total: 2.2 g/dL (ref 1.5–4.5)
Glucose: 81 mg/dL (ref 65–99)
POTASSIUM: 3.7 mmol/L (ref 3.5–5.2)
Sodium: 140 mmol/L (ref 134–144)
Total Protein: 6.3 g/dL (ref 6.0–8.5)

## 2018-05-22 LAB — TESTOSTERONE, FREE, TOTAL, SHBG
Sex Hormone Binding: 20.7 nmol/L (ref 19.3–76.4)
Testosterone, Free: 9.1 pg/mL (ref 6.6–18.1)
Testosterone: 327 ng/dL (ref 264–916)

## 2018-05-23 ENCOUNTER — Ambulatory Visit: Payer: Medicare Other | Admitting: Physical Therapy

## 2018-05-23 DIAGNOSIS — M25562 Pain in left knee: Secondary | ICD-10-CM

## 2018-05-23 DIAGNOSIS — M25662 Stiffness of left knee, not elsewhere classified: Secondary | ICD-10-CM | POA: Diagnosis not present

## 2018-05-23 NOTE — Therapy (Signed)
[facility] [place], [place], [address] Phone: [phone]   Fax:  [phone]  Physical Therapy Treatment  Patient Details  Name: Jeff Stewart MRN: [id number] Date of Birth: Jul 15, 1955 Referring Provider (PT): Edmonia Lynch, MD   Encounter Date: 05/23/2018  PT End of Session - 05/23/18 0948    Visit Number  5    Number of Visits  12    Date for PT Re-Evaluation  06/27/18    Authorization Type  FOTO, progress note every 10th visit    PT Start Time  0947    PT Stop Time  1032    PT Time Calculation (min)  45 min    Activity Tolerance  Patient tolerated treatment well    Behavior During Therapy  [facility] for tasks assessed/performed       Past Medical History:  Diagnosis Date  . Anxiety   . Carpal tunnel syndrome, bilateral   . Cataract    removed years ago  . Chronic back pain    internal morphine pump  . DDD (degenerative disc disease)    neck, lumbar  . Depression   . Dyslipidemia    diet controlled  . Hypertension    borderline  . Sleep apnea    wears C-PAP  . Status post insertion of spinal cord stimulator     Past Surgical History:  Procedure Laterality Date  . BACK SURGERY     x 6  . CARPAL TUNNEL RELEASE     bilateral  . COLONOSCOPY    . ELBOW SURGERY    . INGUINAL HERNIA REPAIR Right   . INTERNAL MORPHINE PUMP     . KNEE ARTHROSCOPY    . NASAL SEPTUM SURGERY     x 6  . neck fusion     . NISSEN FUNDOPLICATION    . SPINAL CORD STIMULATOR INSERTION    . STOMACH SURGERY    . TOTAL KNEE ARTHROPLASTY Left 04/24/2018   Procedure: TOTAL KNEE ARTHROPLASTY;  Surgeon: Renette Butters, MD;  Location: [facility];  Service: Orthopedics;  Laterality: Left;  . UPPER GASTROINTESTINAL ENDOSCOPY      There were no vitals filed for this visit.  Subjective Assessment - 05/23/18 0948    Subjective  Reports he has been using a bike at home.    Pertinent History  left TKA 04/24/2018, HTN    Limitations   Sitting;House hold activities;Standing;Walking    Diagnostic tests  x-ray & MRI    Patient Stated Goals  decrease pain, get moving more, walk without a cane.    Currently in Pain?  No/denies         [facility] PT Assessment - 05/23/18 0001      Assessment   Medical Diagnosis  Left total knee replacement    Referring Provider (PT)  Edmonia Lynch, MD    Onset Date/Surgical Date  04/24/18    Next MD Visit  06/06/2018    Prior Therapy  home therapy      Precautions   Precautions  Other (comment)    Precaution Comments  Spinal cord stimulator, morphine pump      Restrictions   Weight Bearing Restrictions  No      ROM / Strength   AROM / PROM / Strength  AROM      AROM   Overall AROM   Within functional limits for tasks performed;Deficits    AROM Assessment Site  Knee    Right/Left Knee  Left  Left Knee Extension  3    Left Knee Flexion  120                   [facility] Adult PT Treatment/Exercise - 05/23/18 0001      Knee/Hip Exercises: Aerobic   Stationary Bike  L4, seat 7 x12 min      Knee/Hip Exercises: Machines for Strengthening   Cybex Knee Extension  10# 3x10 reps    Cybex Knee Flexion  30# 3x10 reps    Cybex Leg Press  2.5pl, seat 8 x30 reps      Knee/Hip Exercises: Standing   Lateral Step Up  Left;2 sets;10 reps;Hand Hold: 2;Step Height: 6"    Forward Step Up  Left;2 sets;10 reps;Hand Hold: 2;Step Height: 6"      Modalities   Modalities  Vasopneumatic      Vasopneumatic   Number Minutes Vasopneumatic   15 minutes    Vasopnuematic Location   Knee    Vasopneumatic Pressure  Medium    Vasopneumatic Temperature   34                  PT Long Term Goals - 05/23/18 1020      PT LONG TERM GOAL #1   Title  Patient will be independent with HEP and its progression    Time  6    Period  Weeks    Status  On-going      PT LONG TERM GOAL #2   Title  Patient will demonstrate 120+ degrees of left knee AROM flexion to improve ability to perform  functional activities.    Time  6    Period  Weeks    Status  Achieved      PT LONG TERM GOAL #3   Title  Patient will demonstrate 0 egrees of left knee AROM extension to improve gait mechanics.    Time  6    Period  Weeks    Status  On-going      PT LONG TERM GOAL #4   Title  Patient will report ability to perform ADLs independently with left knee pain with equal to or less than 3/10.    Time  6    Period  Weeks    Status  On-going   4-5/10 per patient report 05/23/2018     PT LONG TERM GOAL #5   Title  Patient will negotiate 3 steps with reciprocating gait pattern and one rail to safely enter and exit home.    Time  6    Period  Weeks    Status  On-going      PT LONG TERM GOAL #6   Title  Patient will ambulate community distances without an AD and less than 3/10 left knee pain.     Time  6    Period  Weeks    Status  Achieved            Plan - 05/23/18 1021    Clinical Impression Statement  Patient presented in clinic with no L knee pain upon arrival but reporting greater discomfort with ADLs. Patient guided through machine strengthening without complaints of pain. AROM of L knee measured as 3-120 deg. Patient reports edema continues but is only 4 weeks postop. Normal vasopnuematic response noted following removal of the modality.    Stability/Clinical Decision Making  Stable/Uncomplicated    Rehab Potential  Excellent    PT Frequency  2x / week    PT  Duration  6 weeks    PT Treatment/Interventions  ADLs/Self Care Home Management;Cryotherapy;Ultrasound;Moist Heat;Fluidtherapy;Therapeutic activities;Therapeutic exercise;Gait training;Stair training;Functional mobility training;Neuromuscular re-education;Manual techniques;Patient/family education;Balance training;Vasopneumatic Device;Taping;Passive range of motion    PT Next Visit Plan  Attempt knee extension/knee flexion machine if tolerable. Continue with L knee ROM and strengthening exercises with only vaso (spinal cord  stimulator) per POC.    PT Home Exercise Plan  see patient education section    Consulted and Agree with Plan of Care  Patient       Patient will benefit from skilled therapeutic intervention in order to improve the following deficits and impairments:  Pain, Decreased activity tolerance, Decreased endurance, Decreased range of motion, Decreased strength, Decreased balance, Difficulty walking  Visit Diagnosis: Acute pain of left knee  Stiffness of left knee, not elsewhere classified     Problem List Patient Active Problem List   Diagnosis Date Noted  . Primary localized osteoarthritis of knee 04/24/2018  . Primary osteoarthritis of left knee 04/02/2018  . OSA (obstructive sleep apnea) 04/02/2018  . GERD (gastroesophageal reflux disease) 02/07/2018  . Abdominal pain, epigastric 11/04/2015  . Nausea without vomiting 11/04/2015  . Rectal discomfort 11/04/2015  . Hemorrhoid 08/04/2015  . Dyspnea 06/10/2015  . Hypogonadism in male 04/03/2015  . Essential hypertension, benign 03/04/2015  . Anxiety and depression 02/23/2015  . Chronic back pain 01/14/2015  . Atypical chest pain 06/14/2013    Standley Brooking, PTA 05/23/2018, 10:39 AM  [facility] [address] [place], [place], [address] Phone: [phone]   Fax:  [phone]  Name: ANANT AGARD MRN: [id number] Date of Birth: 02/06/1956

## 2018-05-28 ENCOUNTER — Ambulatory Visit: Payer: Medicare Other | Admitting: "Endocrinology

## 2018-05-28 ENCOUNTER — Ambulatory Visit: Payer: Medicare Other | Admitting: Physical Therapy

## 2018-05-28 DIAGNOSIS — M25562 Pain in left knee: Secondary | ICD-10-CM | POA: Diagnosis not present

## 2018-05-28 DIAGNOSIS — M25662 Stiffness of left knee, not elsewhere classified: Secondary | ICD-10-CM

## 2018-05-28 NOTE — Therapy (Signed)
[facility] [place], [place], [address] Phone: [phone]   Fax:  [phone]  Physical Therapy Treatment  Patient Details  Name: Jeff Stewart MRN: [id number] Date of Birth: [birth year] Referring Provider (PT): Edmonia Lynch, MD   Encounter Date: 05/28/2018  PT End of Session - 05/28/18 0954    Visit Number  6    Number of Visits  12    Date for PT Re-Evaluation  06/27/18    Authorization Type  FOTO, progress note every 10th visit    PT Start Time  0946    PT Stop Time  1030    PT Time Calculation (min)  44 min    Activity Tolerance  Patient tolerated treatment well    Behavior During Therapy  [facility] for tasks assessed/performed       Past Medical History:  Diagnosis Date  . Anxiety   . Carpal tunnel syndrome, bilateral   . Cataract    removed years ago  . Chronic back pain    internal morphine pump  . DDD (degenerative disc disease)    neck, lumbar  . Depression   . Dyslipidemia    diet controlled  . Hypertension    borderline  . Sleep apnea    wears C-PAP  . Status post insertion of spinal cord stimulator     Past Surgical History:  Procedure Laterality Date  . BACK SURGERY     x 6  . CARPAL TUNNEL RELEASE     bilateral  . COLONOSCOPY    . ELBOW SURGERY    . INGUINAL HERNIA REPAIR Right   . INTERNAL MORPHINE PUMP     . KNEE ARTHROSCOPY    . NASAL SEPTUM SURGERY     x 6  . neck fusion     . NISSEN FUNDOPLICATION    . SPINAL CORD STIMULATOR INSERTION    . STOMACH SURGERY    . TOTAL KNEE ARTHROPLASTY Left 04/24/2018   Procedure: TOTAL KNEE ARTHROPLASTY;  Surgeon: Renette Butters, MD;  Location: [facility];  Service: Orthopedics;  Laterality: Left;  . UPPER GASTROINTESTINAL ENDOSCOPY      There were no vitals filed for this visit.  Subjective Assessment - 05/28/18 0953    Subjective  Reports some discomfort today as he did a lot over the weekend.    Pertinent History  left TKA 04/24/2018, HTN     Limitations  Sitting;House hold activities;Standing;Walking    Diagnostic tests  x-ray & MRI    Patient Stated Goals  decrease pain, get moving more, walk without a cane.    Currently in Pain?  Yes    Pain Score  5     Pain Location  Knee    Pain Orientation  Left    Pain Descriptors / Indicators  Discomfort    Pain Type  Surgical pain    Pain Onset  1 to 4 weeks ago         [facility] PT Assessment - 05/28/18 0001      Assessment   Medical Diagnosis  Left total knee replacement    Referring Provider (PT)  Edmonia Lynch, MD    Onset Date/Surgical Date  04/24/18    Next MD Visit  06/06/2018    Prior Therapy  home therapy      Precautions   Precautions  Other (comment)    Precaution Comments  Spinal cord stimulator, morphine pump      Restrictions   Weight Bearing Restrictions  No                   [facility] Adult PT Treatment/Exercise - 05/28/18 0001      Knee/Hip Exercises: Aerobic   Stationary Bike  L4, seat 7 x13 min      Knee/Hip Exercises: Machines for Strengthening   Cybex Knee Extension  10# 3x10 reps    Cybex Knee Flexion  30# 3x10 reps    Cybex Leg Press  2.5pl, seat 8 x30 reps      Knee/Hip Exercises: Standing   Terminal Knee Extension  Strengthening;Left;2 sets;10 reps;Limitations    Terminal Knee Extension Limitations  Pink XTS    Step Down  Left;2 sets;10 reps;Hand Hold: 2;Step Height: 4"   Heel dot     Modalities   Modalities  Vasopneumatic      Vasopneumatic   Number Minutes Vasopneumatic   15 minutes    Vasopnuematic Location   Knee    Vasopneumatic Pressure  Low    Vasopneumatic Temperature   53                  PT Long Term Goals - 05/23/18 1020      PT LONG TERM GOAL #1   Title  Patient will be independent with HEP and its progression    Time  6    Period  Weeks    Status  On-going      PT LONG TERM GOAL #2   Title  Patient will demonstrate 120+ degrees of left knee AROM flexion to improve ability to perform  functional activities.    Time  6    Period  Weeks    Status  Achieved      PT LONG TERM GOAL #3   Title  Patient will demonstrate 0 egrees of left knee AROM extension to improve gait mechanics.    Time  6    Period  Weeks    Status  On-going      PT LONG TERM GOAL #4   Title  Patient will report ability to perform ADLs independently with left knee pain with equal to or less than 3/10.    Time  6    Period  Weeks    Status  On-going   4-5/10 per patient report 05/23/2018     PT LONG TERM GOAL #5   Title  Patient will negotiate 3 steps with reciprocating gait pattern and one rail to safely enter and exit home.    Time  6    Period  Weeks    Status  On-going      PT LONG TERM GOAL #6   Title  Patient will ambulate community distances without an AD and less than 3/10 left knee pain.     Time  6    Period  Weeks    Status  Achieved            Plan - 05/28/18 1027    Clinical Impression Statement  Patient presented in clinic with more L knee pain but patient reporting a lot of activity over the weekend. Patient able to complete therex well although he reported pop in L knee during machine HS curl. Patient provided VCs and demo for new strengthening exercises to ensure proper technique. Edema still present with minimal reduction per patient report. Normal vasopneumatic response noted following removal of the modality.    Stability/Clinical Decision Making  Stable/Uncomplicated    Rehab Potential  Excellent  PT Frequency  2x / week    PT Duration  6 weeks    PT Treatment/Interventions  ADLs/Self Care Home Management;Cryotherapy;Ultrasound;Moist Heat;Fluidtherapy;Therapeutic activities;Therapeutic exercise;Gait training;Stair training;Functional mobility training;Neuromuscular re-education;Manual techniques;Patient/family education;Balance training;Vasopneumatic Device;Taping;Passive range of motion    PT Next Visit Plan  Attempt knee extension/knee flexion machine if tolerable.  Continue with L knee ROM and strengthening exercises with only vaso (spinal cord stimulator) per POC.    PT Home Exercise Plan  see patient education section    Consulted and Agree with Plan of Care  Patient       Patient will benefit from skilled therapeutic intervention in order to improve the following deficits and impairments:  Pain, Decreased activity tolerance, Decreased endurance, Decreased range of motion, Decreased strength, Decreased balance, Difficulty walking  Visit Diagnosis: Acute pain of left knee  Stiffness of left knee, not elsewhere classified     Problem List Patient Active Problem List   Diagnosis Date Noted  . Primary localized osteoarthritis of knee 04/24/2018  . Primary osteoarthritis of left knee 04/02/2018  . OSA (obstructive sleep apnea) 04/02/2018  . GERD (gastroesophageal reflux disease) 02/07/2018  . Abdominal pain, epigastric 11/04/2015  . Nausea without vomiting 11/04/2015  . Rectal discomfort 11/04/2015  . Hemorrhoid 08/04/2015  . Dyspnea 06/10/2015  . Hypogonadism in male 04/03/2015  . Essential hypertension, benign 03/04/2015  . Anxiety and depression 02/23/2015  . Chronic back pain 01/14/2015  . Atypical chest pain 06/14/2013    Standley Brooking, PTA 05/28/2018, 10:32 AM  [facility] [address] [place], [place], [address] Phone: [phone]   Fax:  [phone]  Name: Jeff Stewart MRN: [id number] Date of Birth: Jun 16, 1955

## 2018-05-30 ENCOUNTER — Other Ambulatory Visit: Payer: Self-pay

## 2018-05-30 ENCOUNTER — Ambulatory Visit: Payer: Medicare Other | Admitting: Physical Therapy

## 2018-05-30 DIAGNOSIS — M25562 Pain in left knee: Secondary | ICD-10-CM | POA: Diagnosis not present

## 2018-05-30 DIAGNOSIS — M25662 Stiffness of left knee, not elsewhere classified: Secondary | ICD-10-CM | POA: Diagnosis not present

## 2018-05-30 NOTE — Therapy (Addendum)
[facility] [place], [place], [address] Phone: [phone]   Fax:  [phone]  Physical Therapy Treatment PHYSICAL THERAPY DISCHARGE SUMMARY  Visits from Start of Care: 7  Current functional level related to goals / functional outcomes: See  below   Remaining deficits: See goals   Education / Equipment: HEP Plan: Patient agrees to discharge.  Patient goals were partially met. Patient is being discharged due to not returning since the last visit.  ?????  Gabriela Eves, PT, DPT 05/09/19   Patient Details  Name: Jeff Stewart MRN: [id number] Date of Birth: 1955-12-27 Referring Provider (PT): Edmonia Lynch, MD   Encounter Date: 05/30/2018  PT End of Session - 05/30/18 0958    Visit Number  7    Number of Visits  12    Date for PT Re-Evaluation  06/27/18    Authorization Type  FOTO, progress note every 10th visit    PT Start Time  0949    PT Stop Time  1030    PT Time Calculation (min)  41 min    Activity Tolerance  Patient tolerated treatment well    Behavior During Therapy  [facility] for tasks assessed/performed       Past Medical History:  Diagnosis Date  . Anxiety   . Carpal tunnel syndrome, bilateral   . Cataract    removed years ago  . Chronic back pain    internal morphine pump  . DDD (degenerative disc disease)    neck, lumbar  . Depression   . Dyslipidemia    diet controlled  . Hypertension    borderline  . Sleep apnea    wears C-PAP  . Status post insertion of spinal cord stimulator     Past Surgical History:  Procedure Laterality Date  . BACK SURGERY     x 6  . CARPAL TUNNEL RELEASE     bilateral  . COLONOSCOPY    . ELBOW SURGERY    . INGUINAL HERNIA REPAIR Right   . INTERNAL MORPHINE PUMP     . KNEE ARTHROSCOPY    . NASAL SEPTUM SURGERY     x 6  . neck fusion     . NISSEN FUNDOPLICATION    . SPINAL CORD STIMULATOR INSERTION    . STOMACH SURGERY    . TOTAL KNEE  ARTHROPLASTY Left 04/24/2018   Procedure: TOTAL KNEE ARTHROPLASTY;  Surgeon: Renette Butters, MD;  Location: [facility];  Service: Orthopedics;  Laterality: Left;  . UPPER GASTROINTESTINAL ENDOSCOPY      There were no vitals filed for this visit.  Subjective Assessment - 05/30/18 0957    Subjective  Reports increased discomfort since pop felt during last treatment. Reports being more active now but he iced at home last night.    Pertinent History  left TKA 04/24/2018, HTN    Limitations  Sitting;House hold activities;Standing;Walking    Diagnostic tests  x-ray & MRI    Patient Stated Goals  decrease pain, get moving more, walk without a cane.    Currently in Pain?  Yes    Pain Score  6     Pain Location  Knee    Pain Orientation  Left    Pain Descriptors / Indicators  Discomfort    Pain Type  Surgical pain    Pain Onset  1 to 4 weeks ago    Pain Frequency  Intermittent         [facility] PT Assessment - 05/30/18  0001      Assessment   Medical Diagnosis  Left total knee replacement    Referring Provider (PT)  Edmonia Lynch, MD    Onset Date/Surgical Date  04/24/18    Next MD Visit  06/06/2018    Prior Therapy  home therapy      Precautions   Precautions  Other (comment)    Precaution Comments  Spinal cord stimulator, morphine pump      Restrictions   Weight Bearing Restrictions  No                   [facility] Adult PT Treatment/Exercise - 05/30/18 0001      Knee/Hip Exercises: Aerobic   Stationary Bike  L5, seat 7 x10 min      Knee/Hip Exercises: Machines for Strengthening   Cybex Leg Press  2.5 pl, seat 8 x30 reps      Knee/Hip Exercises: Standing   Terminal Knee Extension  Strengthening;Left;2 sets;10 reps;Limitations    Terminal Knee Extension Limitations  Pink XTS with 5 sec holds     Other Standing Knee Exercises  Attempted L reverse lunge but stopped due to pain      Modalities   Modalities  Vasopneumatic      Vasopneumatic   Number Minutes Vasopneumatic    15 minutes    Vasopnuematic Location   Knee    Vasopneumatic Pressure  Medium    Vasopneumatic Temperature   57      Manual Therapy   Manual Therapy  Soft tissue mobilization    Soft tissue mobilization  STW to L pes anserine, hip adductors to reduce pain and tightness; Incision and patella mobility completed in all directions to promote proper mobility                  PT Long Term Goals - 05/23/18 1020      PT LONG TERM GOAL #1   Title  Patient will be independent with HEP and its progression    Time  6    Period  Weeks    Status  On-going      PT LONG TERM GOAL #2   Title  Patient will demonstrate 120+ degrees of left knee AROM flexion to improve ability to perform functional activities.    Time  6    Period  Weeks    Status  Achieved      PT LONG TERM GOAL #3   Title  Patient will demonstrate 0 egrees of left knee AROM extension to improve gait mechanics.    Time  6    Period  Weeks    Status  On-going      PT LONG TERM GOAL #4   Title  Patient will report ability to perform ADLs independently with left knee pain with equal to or less than 3/10.    Time  6    Period  Weeks    Status  On-going   4-5/10 per patient report 05/23/2018     PT LONG TERM GOAL #5   Title  Patient will negotiate 3 steps with reciprocating gait pattern and one rail to safely enter and exit home.    Time  6    Period  Weeks    Status  On-going      PT LONG TERM GOAL #6   Title  Patient will ambulate community distances without an AD and less than 3/10 left knee pain.     Time  6  Period  Weeks    Status  Achieved            Plan - 05/30/18 1035    Clinical Impression Statement  Patient presented in clinic with more L knee discomfort since Monday. Light exercises completed today secondary to discomfort and more advanced strengthening exercises avoided secondary to pain. Tightness of the L hip adductors at distal attachment noted during manual therapy. Circular region of  inferior aspect of incision noted with edema although edema noted throughout L knee. Good L patella mobility and incision mobility noted during treatment. Patient educated to monitor symptoms at home and ice as needed. Normal vasopneumatic response noted following removal of the modality.    Stability/Clinical Decision Making  Stable/Uncomplicated    Rehab Potential  Excellent    PT Frequency  2x / week    PT Duration  6 weeks    PT Treatment/Interventions  ADLs/Self Care Home Management;Cryotherapy;Ultrasound;Moist Heat;Fluidtherapy;Therapeutic activities;Therapeutic exercise;Gait training;Stair training;Functional mobility training;Neuromuscular re-education;Manual techniques;Patient/family education;Balance training;Vasopneumatic Device;Taping;Passive range of motion    PT Next Visit Plan  Assess L knee symptoms and progress as tolerated.    PT Home Exercise Plan  see patient education section    Consulted and Agree with Plan of Care  Patient       Patient will benefit from skilled therapeutic intervention in order to improve the following deficits and impairments:  Pain, Decreased activity tolerance, Decreased endurance, Decreased range of motion, Decreased strength, Decreased balance, Difficulty walking  Visit Diagnosis: Acute pain of left knee  Stiffness of left knee, not elsewhere classified     Problem List Patient Active Problem List   Diagnosis Date Noted  . Primary localized osteoarthritis of knee 04/24/2018  . Primary osteoarthritis of left knee 04/02/2018  . OSA (obstructive sleep apnea) 04/02/2018  . GERD (gastroesophageal reflux disease) 02/07/2018  . Abdominal pain, epigastric 11/04/2015  . Nausea without vomiting 11/04/2015  . Rectal discomfort 11/04/2015  . Hemorrhoid 08/04/2015  . Dyspnea 06/10/2015  . Hypogonadism in male 04/03/2015  . Essential hypertension, benign 03/04/2015  . Anxiety and depression 02/23/2015  . Chronic back pain 01/14/2015  . Atypical  chest pain 06/14/2013    Standley Brooking, PTA 05/30/2018, 10:46 AM  [facility] [address] [place], [place], [address] Phone: [phone]   Fax:  [phone]  Name: Jeff Stewart MRN: [id number] Date of Birth: 1955/07/22

## 2018-06-01 ENCOUNTER — Ambulatory Visit (INDEPENDENT_AMBULATORY_CARE_PROVIDER_SITE_OTHER): Payer: Medicare Other | Admitting: "Endocrinology

## 2018-06-01 ENCOUNTER — Other Ambulatory Visit: Payer: Self-pay

## 2018-06-01 ENCOUNTER — Encounter: Payer: Self-pay | Admitting: "Endocrinology

## 2018-06-01 VITALS — BP 120/85 | HR 75 | Ht 73.0 in | Wt 262.0 lb

## 2018-06-01 DIAGNOSIS — E291 Testicular hypofunction: Secondary | ICD-10-CM

## 2018-06-01 MED ORDER — TESTOSTERONE CYPIONATE 200 MG/ML IM SOLN: 100.0000 mg | INTRAMUSCULAR | 0 refills | Status: DC

## 2018-06-01 NOTE — Progress Notes (Signed)
Endocrinology follow-up note  Subjective:    Patient ID: Jeff Stewart, male    DOB: 04-02-1955, PCP Dettinger, Jeff Kaufmann, MD   Past Medical History:  Diagnosis Date  . Anxiety   . Carpal tunnel syndrome, bilateral   . Cataract    removed years ago  . Chronic back pain    internal morphine pump  . DDD (degenerative disc disease)    neck, lumbar  . Depression   . Dyslipidemia    diet controlled  . Hypertension    borderline  . Sleep apnea    wears C-PAP  . Status post insertion of spinal cord stimulator    Past Surgical History:  Procedure Laterality Date  . BACK SURGERY     x 6  . CARPAL TUNNEL RELEASE     bilateral  . COLONOSCOPY    . ELBOW SURGERY    . INGUINAL HERNIA REPAIR Right   . INTERNAL MORPHINE PUMP     . KNEE ARTHROSCOPY    . NASAL SEPTUM SURGERY     x 6  . neck fusion     . NISSEN FUNDOPLICATION    . SPINAL CORD STIMULATOR INSERTION    . STOMACH SURGERY    . TOTAL KNEE ARTHROPLASTY Left 04/24/2018   Procedure: TOTAL KNEE ARTHROPLASTY;  Surgeon: Renette Butters, MD;  Location: [facility];  Service: Orthopedics;  Laterality: Left;  . UPPER GASTROINTESTINAL ENDOSCOPY     Social History   Socioeconomic History  . Marital status: Married    Spouse name: Not on file  . Number of children: 3  . Years of education: Not on file  . Highest education level: Not on file  Occupational History  . 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  Scientific laboratory technician  . Financial resource strain: Not on file  . Food insecurity:    Worry: Not on file    Inability: Not on file  . Transportation needs:    Medical: Not on file    Non-medical: Not on file  Tobacco Use  . Smoking status: Never Smoker  . Smokeless tobacco: Never Used  Substance and Sexual Activity  . Alcohol use: No  . Drug use: No  . Sexual activity: Not Currently    Birth control/protection: Post-menopausal    Comment: married for 1972  Lifestyle  . Physical activity:    Days per week: Not  on file    Minutes per session: Not on file  . Stress: Not on file  Relationships  . Social connections:    Talks on phone: Not on file    Gets together: Not on file    Attends religious service: Not on file    Active member of club or organization: Not on file    Attends meetings of clubs or organizations: Not on file    Relationship status: Not on file  Other Topics Concern  . Not on file  Social History Narrative   Lives with wife    Outpatient Encounter Medications as of 06/01/2018  Medication Sig  . aspirin EC 81 MG tablet Take 1 tablet (81 mg total) by mouth 2 (two) times daily. For DVT prophylaxis for 30 days after surgery.  . cetirizine (ZYRTEC) 10 MG tablet Take 10 mg by mouth daily.  . fluticasone (FLONASE) 50 MCG/ACT nasal spray Place 1 spray into both nostrils 2 (two) times daily as needed for allergies or rhinitis.  . furosemide (LASIX) 40 MG tablet Take 1 tablet (40  mg total) by mouth every morning.  . hydrochlorothiazide (HYDRODIURIL) 25 MG tablet TAKE 1 TABLET DAILY  . losartan (COZAAR) 50 MG tablet Take 1 tablet (50 mg total) by mouth 2 (two) times daily. Take 50 mg 2 (two) times daily by mouth. (Patient taking differently: Take 50 mg by mouth 2 (two) times daily. )  . omeprazole (PRILOSEC) 20 MG capsule Take 1 capsule (20 mg total) by mouth 2 (two) times daily before a meal.  . sertraline (ZOLOFT) 100 MG tablet Take 1 tablet (100 mg total) by mouth daily. (Needs to be seen before next refill) (Patient taking differently: Take 100 mg by mouth daily. )  . Syringe/Needle, Disp, (SYRINGE 3CC/21GX1-1/4") 21G X 1-1/4" 3 ML MISC 1 each by Does not apply route once a week. (Patient not taking: Reported on 04/06/2018)  . testosterone cypionate (DEPOTESTOSTERONE CYPIONATE) 200 MG/ML injection Inject 0.5 mLs (100 mg total) into the muscle once a week.  . [DISCONTINUED] docusate sodium (COLACE) 100 MG capsule Take 1 capsule (100 mg total) by mouth 2 (two) times daily. To prevent  constipation while taking pain medication.  . [DISCONTINUED] gabapentin (NEURONTIN) 300 MG capsule Take 1 capsule (300 mg total) by mouth 2 (two) times daily for 14 days. For 2 weeks post op for pain.  . [DISCONTINUED] methocarbamol (ROBAXIN) 500 MG tablet Take 1 tablet (500 mg total) by mouth every 8 (eight) hours as needed for muscle spasms.  . [DISCONTINUED] ondansetron (ZOFRAN) 4 MG tablet Take 1 tablet (4 mg total) by mouth every 8 (eight) hours as needed for nausea or vomiting.  . [DISCONTINUED] testosterone cypionate (DEPOTESTOSTERONE CYPIONATE) 200 MG/ML injection Inject 0.5 mLs (100 mg total) into the muscle once a week. (Patient taking differently: Inject 100 mg into the muscle every Monday. )   Facility-Administered Encounter Medications as of 06/01/2018  Medication  . 0.9 %  sodium chloride infusion   ALLERGIES: Allergies  Allergen Reactions  . Nubain [Nalbuphine Hcl] Rash   VACCINATION STATUS: Immunization History  Administered Date(s) Administered  . Influenza,inj,Quad PF,6+ Mos 01/14/2015, 02/02/2016, 02/08/2018  . Tdap 12/11/2014    HPI [name] is a 63 year old gentleman with a medical history as above.  He is here to follow-up for hypogonadism on replacement therapy. -He reports better engagement with testosterone injection 100 mg IM weekly except for rare interruptions.  He continues to feel better, he underwent left knee replacement since last visit.   -He reports better energy level, better mobility. He was known to have low testosterone for at least since age 63.  He fathers 3 grown children. He denies history of head injury , has had nasal septal surgery multiple times. He denies injury to the testicles, exposure to chemotherapy, exposure to radiation to the genitals. However, due to chronic back injury and surgery he has exposure to heavy opioid therapy for pain control. He is currently on morphine pump. Overall on  opioids for pain control for the last 10-15  years.   Review of Systems Constitutional:  + Progressive weight gain, no subjective hyperthermia or hypothermia.   Eyes: no blurry vision, no xerophthalmia ENT: no sore throat, no nodules palpated in throat, no dysphagia/odynophagia, no hoarseness  Musculoskeletal: He has diffuse large joint arthralgias including back pain, bilateral knee pain.   Skin: no rashes,  Genitourinary: Reports improved libido while taking testosterone Neurological: no tremors/numbness/tingling/dizziness Psychiatric: no depression/anxiety  Objective:    BP 120/85   Pulse 75   Ht '6\' 1"'$  (1.854 m)   Wt  262 lb (118.8 kg)   BMI 34.57 kg/m   Wt Readings from Last 3 Encounters:  06/01/18 262 lb (118.8 kg)  04/24/18 258 lb (117 kg)  03/29/18 260 lb 6.4 oz (118.1 kg)    Physical Exam   Constitutional:  + Obese, + fatigue, not in acute distress.   Eyes: PERRLA, EOMI, no exophthalmos ENT: moist mucous membranes, no thyromegaly, no cervical lymphadenopathy Cardiovascular: RRR, No MRG Genitourinary system: He has bilaterally shrunk testicles, right testes 12 mL, left testes 15 mL. No scrotal mass, no varicocele,  no inguinal lymphadenopathy. Musculoskeletal: Large extremities patient explains this feature is familial, no deformities, strength intact in all 4 Skin: moist, warm, no rashes Neurological: no tremor with outstretched hands.   Recent Results (from the past 2160 hour(s))  CBC with Differential/Platelet     Status: None   Collection Time: 03/29/18 11:46 AM  Result Value Ref Range   WBC 4.3 3.4 - 10.8 x10E3/uL   RBC 5.04 4.14 - 5.80 x10E6/uL   Hemoglobin 14.5 13.0 - 17.7 g/dL   Hematocrit 43.8 37.5 - 51.0 %   MCV 87 79 - 97 fL   MCH 28.8 26.6 - 33.0 pg   MCHC 33.1 31.5 - 35.7 g/dL   RDW 13.3 11.6 - 15.4 %    Comment:               **Please note reference interval change**   Platelets 172 150 - 450 x10E3/uL   Neutrophils 57 Not Estab. %   Lymphs 29 Not Estab. %   Monocytes 11 Not Estab. %    Eos 2 Not Estab. %   Basos 1 Not Estab. %   Neutrophils Absolute 2.5 1.4 - 7.0 x10E3/uL   Lymphocytes Absolute 1.2 0.7 - 3.1 x10E3/uL   Monocytes Absolute 0.5 0.1 - 0.9 x10E3/uL   EOS (ABSOLUTE) 0.1 0.0 - 0.4 x10E3/uL   Basophils Absolute 0.0 0.0 - 0.2 x10E3/uL   Immature Granulocytes 0 Not Estab. %   Immature Grans (Abs) 0.0 0.0 - 0.1 x10E3/uL  CMP14+EGFR     Status: None   Collection Time: 03/29/18 11:46 AM  Result Value Ref Range   Glucose 72 65 - 99 mg/dL   BUN 17 8 - 27 mg/dL   Creatinine, Ser 0.96 0.76 - 1.27 mg/dL   GFR calc non Af Amer 83 >59 mL/min/1.73   GFR calc Af Amer 96 >59 mL/min/1.73   BUN/Creatinine Ratio 18 10 - 24   Sodium 138 134 - 144 mmol/L   Potassium 3.7 3.5 - 5.2 mmol/L   Chloride 97 96 - 106 mmol/L   CO2 26 20 - 29 mmol/L   Calcium 9.2 8.6 - 10.2 mg/dL   Total Protein 6.7 6.0 - 8.5 g/dL   Albumin 4.5 3.6 - 4.8 g/dL    Comment:     **Effective April 09, 2018 Albumin reference**       interval will be changing to:              Age                Male          Male           0 -  7 days        3.6 - 4.9      3.6 - 4.9           8 - 30 days        3.4 -  4.7      3.4 - 4.7           1 -  6 month       3.7 - 4.8      3.7 - 4.8    7 months -  2 years       3.9 - 5.0      3.9 - 5.0           3 -  5 years       4.0 - 5.0      4.0 - 5.0           6 - 12 years       4.1 - 5.0      4.0 - 5.0          13 - 30 years       4.1 - 5.2      3.9 - 5.0          31 - 50 years       4.0 - 5.0      3.8 - 4.8          51 - 60 years       3.8 - 4.9      3.8 - 4.9          61 - 70 years       3.8 - 4.8      3.8 - 4.8          71 - 80 years       3.7 - 4.7      3.7 - 4.7          81 - 89 years       3.6 - 4.6      3.6 - 4.6              >89 years       3.5 - 4.6      3.5 - 4.6    Globulin, Total 2.2 1.5 - 4.5 g/dL   Albumin/Globulin Ratio 2.0 1.2 - 2.2   Bilirubin Total 0.4 0.0 - 1.2 mg/dL   Alkaline Phosphatase 96 39 - 117 IU/L   AST 24 0 - 40 IU/L   ALT 26 0 - 44  IU/L  Lipid panel     Status: Abnormal   Collection Time: 03/29/18 11:46 AM  Result Value Ref Range   Cholesterol, Total 175 100 - 199 mg/dL   Triglycerides 114 0 - 149 mg/dL   HDL 49 >39 mg/dL   VLDL Cholesterol Cal 23 5 - 40 mg/dL   LDL Calculated 103 (H) 0 - 99 mg/dL   Chol/HDL Ratio 3.6 0.0 - 5.0 ratio    Comment:                                   T. Chol/HDL Ratio                                             Men  Women                               1/2 Avg.Risk  3.4    3.3  Avg.Risk  5.0    4.4                                2X Avg.Risk  9.6    7.1                                3X Avg.Risk 23.4   11.0   Surgical pcr screen     Status: None   Collection Time: 04/17/18  9:06 AM  Result Value Ref Range   MRSA, PCR NEGATIVE NEGATIVE   Staphylococcus aureus NEGATIVE NEGATIVE    Comment: (NOTE) The Xpert SA Assay (FDA approved for NASAL specimens in patients 57 years of age and older), is one component of a comprehensive surveillance program. It is not intended to diagnose infection nor to guide or monitor treatment. Performed at [facility], [place] [address] [place], [place] [address]   Basic metabolic panel     Status: None   Collection Time: 04/17/18  9:33 AM  Result Value Ref Range   Sodium 138 135 - 145 mmol/L   Potassium 4.1 3.5 - 5.1 mmol/L   Chloride 102 98 - 111 mmol/L   CO2 28 22 - 32 mmol/L   Glucose, Bld 82 70 - 99 mg/dL   BUN 22 8 - 23 mg/dL   Creatinine, Ser 0.95 0.61 - 1.24 mg/dL   Calcium 9.0 8.9 - 10.3 mg/dL   GFR calc non Af Amer >60 >60 mL/min   GFR calc Af Amer >60 >60 mL/min   Anion gap 8 5 - 15    Comment: Performed at [facility], [place] [address], [place], [place] [address]  CBC     Status: Abnormal   Collection Time: 04/17/18  9:33 AM  Result Value Ref Range   WBC 3.9 (L) 4.0 - 10.5 K/uL   RBC 4.62 4.22 - 5.81 MIL/uL   Hemoglobin 13.7 13.0 - 17.0 g/dL   HCT 42.9 39.0 - 52.0  %   MCV 92.9 80.0 - 100.0 fL   MCH 29.7 26.0 - 34.0 pg   MCHC 31.9 30.0 - 36.0 g/dL   RDW 13.2 11.5 - 15.5 %   Platelets 128 (L) 150 - 400 K/uL   nRBC 0.0 0.0 - 0.2 %    Comment: Performed at [facility], [place] [address] [place], [place] [address]  Testosterone, Free, Total, SHBG     Status: None   Collection Time: 05/21/18  9:26 AM  Result Value Ref Range   Testosterone 327 264 - 916 ng/dL    Comment: Adult male reference interval is based on a population of healthy nonobese males (BMI <30) between 45 and 76 years old. [place], [place] [phone]. PMID: 81275170.    Testosterone, Free 9.1 6.6 - 18.1 pg/mL   Sex Hormone Binding 20.7 19.3 - 76.4 nmol/L  CMP14+EGFR     Status: Abnormal   Collection Time: 05/21/18  9:26 AM  Result Value Ref Range   Glucose 81 65 - 99 mg/dL   BUN 16 8 - 27 mg/dL   Creatinine, Ser 0.90 0.76 - 1.27 mg/dL   GFR calc non Af Amer 89 >59 mL/min/1.73   GFR calc Af Amer 103 >59 mL/min/1.73   BUN/Creatinine Ratio 18 10 - 24   Sodium 140 134 - 144 mmol/L   Potassium 3.7 3.5 -  5.2 mmol/L   Chloride 97 96 - 106 mmol/L   CO2 29 20 - 29 mmol/L   Calcium 9.2 8.6 - 10.2 mg/dL   Total Protein 6.3 6.0 - 8.5 g/dL   Albumin 4.1 3.8 - 4.8 g/dL   Globulin, Total 2.2 1.5 - 4.5 g/dL   Albumin/Globulin Ratio 1.9 1.2 - 2.2   Bilirubin Total 0.4 0.0 - 1.2 mg/dL   Alkaline Phosphatase 119 (H) 39 - 117 IU/L   AST 14 0 - 40 IU/L   ALT 22 0 - 44 IU/L     Assessment & Plan:   1. Hypogonadism  -Testosterone replacement helped improve his testosterone from 74 ->286 -> 782 -> 509 > 978 > 659-> 437 >494> 327   No adverse events noted. 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   -It is possible that it is multifactorial including exposure to heavy opioids, multiple back surgeries, prior inguinal surgery, or idiopathic.  -Prolactin level is favorable at 6.1, he  will not need pituitary/sella imaging for now.  -He has benefited from testosterone supplement and he wishes to continue on testosterone replacement therapy.   -He is advised to continue testosterone 100 mg IM weekly  for target testosterone level of 400-600 mcg/dL.  -He we will return in 50-month with total and free testosterone measurements.  -His PSA is at target.  He will have CBC as part of his next labs. -He is advised to pick up some more exercise. - Patient admits there is a room for improvement in his diet and drink choices. -  Suggestion is made for him to avoid simple carbohydrates  from his diet including Cakes, Sweet Desserts / Pastries, Ice Cream, Soda (diet and regular), Sweet Tea, Candies, Chips, Cookies, Store Bought Juices, Alcohol in Excess of  1-2 drinks a day, Artificial Sweeteners, and "Sugar-free" Products. This will help patient to have stable blood glucose profile and potentially avoid unintended weight gain.  - I advised patient to maintain close follow up with Dettinger, JFransisca Kaufmann MD for primary care needs.  Follow up plan: Return in about 4 months (around 10/01/2018) for Follow up with Pre-visit Labs.  GGlade Lloyd MD Phone: [phone] Fax: [phone]  This note was partially dictated with voice recognition software. Similar sounding words can be transcribed inadequately or may not  be corrected upon review.  06/01/2018, 12:20 PM

## 2018-06-04 ENCOUNTER — Ambulatory Visit: Payer: Medicare Other | Admitting: Physical Therapy

## 2018-06-04 DIAGNOSIS — Z79899 Other long term (current) drug therapy: Secondary | ICD-10-CM | POA: Diagnosis not present

## 2018-06-04 DIAGNOSIS — G894 Chronic pain syndrome: Secondary | ICD-10-CM | POA: Diagnosis not present

## 2018-06-04 DIAGNOSIS — Z978 Presence of other specified devices: Secondary | ICD-10-CM | POA: Diagnosis not present

## 2018-06-04 DIAGNOSIS — Z5181 Encounter for therapeutic drug level monitoring: Secondary | ICD-10-CM | POA: Diagnosis not present

## 2018-06-04 DIAGNOSIS — M961 Postlaminectomy syndrome, not elsewhere classified: Secondary | ICD-10-CM | POA: Diagnosis not present

## 2018-06-06 ENCOUNTER — Encounter: Payer: Medicare Other | Admitting: Physical Therapy

## 2018-06-08 ENCOUNTER — Encounter: Payer: Self-pay | Admitting: Gastroenterology

## 2018-06-22 ENCOUNTER — Telehealth: Payer: Self-pay | Admitting: Physical Therapy

## 2018-06-22 NOTE — Telephone Encounter (Signed)
Jeff Stewart was contacted today regarding the temporary reduction of OP Rehab Services  due to concerns for community transmission of Covid-19. Left voicemail for patient to give our office a call back and if he would like to schedule for as we are prioritizing post-operative patients.

## 2018-06-29 ENCOUNTER — Telehealth: Payer: Self-pay | Admitting: Physical Therapy

## 2018-06-29 NOTE — Telephone Encounter (Signed)
Jeff Stewart was contacted today regarding the temporary reduction of OP Rehab Services due to concerns for community transmission of Covid-19.  Left voicemail for patient to give our office a call back if he would like to schedule follow up appointment.

## 2018-07-11 DIAGNOSIS — M1712 Unilateral primary osteoarthritis, left knee: Secondary | ICD-10-CM | POA: Diagnosis not present

## 2018-07-12 DIAGNOSIS — G894 Chronic pain syndrome: Secondary | ICD-10-CM | POA: Diagnosis not present

## 2018-07-12 DIAGNOSIS — Z978 Presence of other specified devices: Secondary | ICD-10-CM | POA: Diagnosis not present

## 2018-07-12 DIAGNOSIS — M961 Postlaminectomy syndrome, not elsewhere classified: Secondary | ICD-10-CM | POA: Diagnosis not present

## 2018-07-17 ENCOUNTER — Other Ambulatory Visit: Payer: Self-pay | Admitting: Family Medicine

## 2018-08-16 ENCOUNTER — Telehealth: Payer: Self-pay | Admitting: Physical Therapy

## 2018-08-16 NOTE — Telephone Encounter (Signed)
Jeff Stewart was contacted in regards to resuming therapy. Left voicemail for patient to give our office a call back.

## 2018-08-21 DIAGNOSIS — G894 Chronic pain syndrome: Secondary | ICD-10-CM | POA: Diagnosis not present

## 2018-08-21 DIAGNOSIS — Z978 Presence of other specified devices: Secondary | ICD-10-CM | POA: Diagnosis not present

## 2018-08-21 DIAGNOSIS — Z5181 Encounter for therapeutic drug level monitoring: Secondary | ICD-10-CM | POA: Diagnosis not present

## 2018-08-21 DIAGNOSIS — Z79899 Other long term (current) drug therapy: Secondary | ICD-10-CM | POA: Diagnosis not present

## 2018-08-21 DIAGNOSIS — M961 Postlaminectomy syndrome, not elsewhere classified: Secondary | ICD-10-CM | POA: Diagnosis not present

## 2018-08-26 DIAGNOSIS — M961 Postlaminectomy syndrome, not elsewhere classified: Secondary | ICD-10-CM | POA: Diagnosis not present

## 2018-08-26 DIAGNOSIS — Z1159 Encounter for screening for other viral diseases: Secondary | ICD-10-CM | POA: Diagnosis not present

## 2018-08-26 DIAGNOSIS — Z01812 Encounter for preprocedural laboratory examination: Secondary | ICD-10-CM | POA: Diagnosis not present

## 2018-08-26 DIAGNOSIS — Z978 Presence of other specified devices: Secondary | ICD-10-CM | POA: Diagnosis not present

## 2018-08-26 DIAGNOSIS — G894 Chronic pain syndrome: Secondary | ICD-10-CM | POA: Diagnosis not present

## 2018-08-29 DIAGNOSIS — T85112A Breakdown (mechanical) of implanted electronic neurostimulator (electrode) of spinal cord, initial encounter: Secondary | ICD-10-CM | POA: Diagnosis not present

## 2018-08-29 DIAGNOSIS — M545 Low back pain: Secondary | ICD-10-CM | POA: Diagnosis not present

## 2018-08-29 DIAGNOSIS — M5416 Radiculopathy, lumbar region: Secondary | ICD-10-CM | POA: Diagnosis not present

## 2018-08-29 DIAGNOSIS — G4733 Obstructive sleep apnea (adult) (pediatric): Secondary | ICD-10-CM | POA: Diagnosis not present

## 2018-08-29 DIAGNOSIS — K219 Gastro-esophageal reflux disease without esophagitis: Secondary | ICD-10-CM | POA: Diagnosis not present

## 2018-08-29 DIAGNOSIS — G894 Chronic pain syndrome: Secondary | ICD-10-CM | POA: Diagnosis not present

## 2018-08-29 DIAGNOSIS — Z888 Allergy status to other drugs, medicaments and biological substances status: Secondary | ICD-10-CM | POA: Diagnosis not present

## 2018-08-29 DIAGNOSIS — F329 Major depressive disorder, single episode, unspecified: Secondary | ICD-10-CM | POA: Diagnosis not present

## 2018-08-29 DIAGNOSIS — M961 Postlaminectomy syndrome, not elsewhere classified: Secondary | ICD-10-CM | POA: Diagnosis not present

## 2018-09-27 DIAGNOSIS — M961 Postlaminectomy syndrome, not elsewhere classified: Secondary | ICD-10-CM | POA: Diagnosis not present

## 2018-09-27 DIAGNOSIS — Z978 Presence of other specified devices: Secondary | ICD-10-CM | POA: Diagnosis not present

## 2018-09-27 DIAGNOSIS — M5410 Radiculopathy, site unspecified: Secondary | ICD-10-CM | POA: Diagnosis not present

## 2018-09-27 DIAGNOSIS — G894 Chronic pain syndrome: Secondary | ICD-10-CM | POA: Diagnosis not present

## 2018-10-05 ENCOUNTER — Other Ambulatory Visit: Payer: Medicare Other

## 2018-10-05 ENCOUNTER — Other Ambulatory Visit: Payer: Self-pay

## 2018-10-05 DIAGNOSIS — E291 Testicular hypofunction: Secondary | ICD-10-CM | POA: Diagnosis not present

## 2018-10-06 LAB — TESTOSTERONE, FREE, TOTAL, SHBG
Sex Hormone Binding: 20.6 nmol/L (ref 19.3–76.4)
Testosterone, Free: 11.5 pg/mL (ref 6.6–18.1)
Testosterone: 506 ng/dL (ref 264–916)

## 2018-10-08 ENCOUNTER — Other Ambulatory Visit: Payer: Self-pay | Admitting: *Deleted

## 2018-10-08 ENCOUNTER — Ambulatory Visit: Payer: Medicare Other | Admitting: "Endocrinology

## 2018-10-08 MED ORDER — CETIRIZINE HCL 10 MG PO TABS: 10.0000 mg | ORAL_TABLET | Freq: Every day | ORAL | 4 refills | Status: DC

## 2018-10-15 ENCOUNTER — Other Ambulatory Visit: Payer: Self-pay | Admitting: Family Medicine

## 2018-10-15 ENCOUNTER — Other Ambulatory Visit: Payer: Self-pay | Admitting: "Endocrinology

## 2018-10-15 DIAGNOSIS — R0981 Nasal congestion: Secondary | ICD-10-CM

## 2018-10-17 ENCOUNTER — Telehealth: Payer: Self-pay | Admitting: "Endocrinology

## 2018-10-17 MED ORDER — TESTOSTERONE CYPIONATE 200 MG/ML IM SOLN: INTRAMUSCULAR | 0 refills | Status: DC

## 2018-10-17 NOTE — Telephone Encounter (Signed)
[facility] needs paper script of testosterone cypionate (DEPOTESTOSTERONE CYPIONATE) 200 MG/ML injection faxed to them.

## 2018-10-18 ENCOUNTER — Other Ambulatory Visit: Payer: Self-pay

## 2018-10-18 MED ORDER — TESTOSTERONE CYPIONATE 200 MG/ML IM SOLN: INTRAMUSCULAR | 0 refills | Status: DC

## 2018-10-18 NOTE — Telephone Encounter (Signed)
Rx faxed

## 2018-10-23 ENCOUNTER — Ambulatory Visit (INDEPENDENT_AMBULATORY_CARE_PROVIDER_SITE_OTHER): Payer: Medicare Other | Admitting: "Endocrinology

## 2018-10-23 ENCOUNTER — Other Ambulatory Visit: Payer: Self-pay

## 2018-10-23 ENCOUNTER — Encounter: Payer: Self-pay | Admitting: "Endocrinology

## 2018-10-23 VITALS — BP 145/82 | HR 55 | Temp 97.3°F | Ht 73.0 in | Wt 265.0 lb

## 2018-10-23 DIAGNOSIS — E291 Testicular hypofunction: Secondary | ICD-10-CM | POA: Diagnosis not present

## 2018-10-23 MED ORDER — TESTOSTERONE CYPIONATE 200 MG/ML IM SOLN: INTRAMUSCULAR | 0 refills | Status: DC

## 2018-10-23 NOTE — Progress Notes (Signed)
Endocrinology follow-up note  Subjective:    Patient ID: Jeff Stewart, male    DOB: [birth year], PCP Dettinger, Jeff Kaufmann, MD   Past Medical History:  Diagnosis Date  . Anxiety   . Carpal tunnel syndrome, bilateral   . Cataract    removed years ago  . Chronic back pain    internal morphine pump  . DDD (degenerative disc disease)    neck, lumbar  . Depression   . Dyslipidemia    diet controlled  . Hypertension    borderline  . Sleep apnea    wears C-PAP  . Status post insertion of spinal cord stimulator    Past Surgical History:  Procedure Laterality Date  . BACK SURGERY     x 6  . CARPAL TUNNEL RELEASE     bilateral  . COLONOSCOPY    . ELBOW SURGERY    . INGUINAL HERNIA REPAIR Right   . INTERNAL MORPHINE PUMP     . KNEE ARTHROSCOPY    . NASAL SEPTUM SURGERY     x 6  . neck fusion     . NISSEN FUNDOPLICATION    . SPINAL CORD STIMULATOR INSERTION    . STOMACH SURGERY    . TOTAL KNEE ARTHROPLASTY Left 04/24/2018   Procedure: TOTAL KNEE ARTHROPLASTY;  Surgeon: Renette Butters, MD;  Location: [facility];  Service: Orthopedics;  Laterality: Left;  . UPPER GASTROINTESTINAL ENDOSCOPY     Social History   Socioeconomic History  . Marital status: Married    Spouse name: Not on file  . Number of children: 3  . Years of education: Not on file  . Highest education level: Not on file  Occupational History  . 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  Scientific laboratory technician  . Financial resource strain: Not on file  . Food insecurity    Worry: Not on file    Inability: Not on file  . Transportation needs    Medical: Not on file    Non-medical: Not on file  Tobacco Use  . Smoking status: Never Smoker  . Smokeless tobacco: Never Used  Substance and Sexual Activity  . Alcohol use: No  . Drug use: No  . Sexual activity: Not Currently    Birth control/protection: Post-menopausal    Comment: married for 1972  Lifestyle  . Physical activity    Days per week: Not on  file    Minutes per session: Not on file  . Stress: Not on file  Relationships  . Social Herbalist on phone: Not on file    Gets together: Not on file    Attends religious service: Not on file    Active member of club or organization: Not on file    Attends meetings of clubs or organizations: Not on file    Relationship status: Not on file  Other Topics Concern  . Not on file  Social History Narrative   Lives with wife    Outpatient Encounter Medications as of 10/23/2018  Medication Sig  . aspirin EC 81 MG tablet Take 1 tablet (81 mg total) by mouth 2 (two) times daily. For DVT prophylaxis for 30 days after surgery.  . cetirizine (ZYRTEC) 10 MG tablet Take 1 tablet (10 mg total) by mouth daily.  . fluticasone (FLONASE) 50 MCG/ACT nasal spray SPRAY 1 SPRAY IN EACH NOSTRIL TWICE DAILY. SHAKE GENTLY BEFORE EACH USE.  . furosemide (LASIX) 40 MG tablet Take 1 tablet (40  mg total) by mouth every morning.  . hydrochlorothiazide (HYDRODIURIL) 25 MG tablet TAKE 1 TABLET DAILY  . losartan (COZAAR) 50 MG tablet Take 1 tablet (50 mg total) by mouth 2 (two) times daily. Take 50 mg 2 (two) times daily by mouth. (Patient taking differently: Take 50 mg by mouth 2 (two) times daily. )  . omeprazole (PRILOSEC) 20 MG capsule Take 1 capsule (20 mg total) by mouth 2 (two) times daily before a meal.  . sertraline (ZOLOFT) 100 MG tablet Take 1 tablet (100 mg total) by mouth daily. (Needs to be seen before next refill) (Patient taking differently: Take 100 mg by mouth daily. )  . Syringe/Needle, Disp, (SYRINGE 3CC/21GX1-1/4") 21G X 1-1/4" 3 ML MISC 1 each by Does not apply route once a week. (Patient not taking: Reported on 04/06/2018)  . testosterone cypionate (DEPOTESTOSTERONE CYPIONATE) 200 MG/ML injection INJECT 0.5 MLS INTRAMUSCULARY ONCE A WEEK  . [DISCONTINUED] testosterone cypionate (DEPOTESTOSTERONE CYPIONATE) 200 MG/ML injection INJECT 0.5 MLS INTRAMUSCULARY ONCE A WEEK    Facility-Administered Encounter Medications as of 10/23/2018  Medication  . 0.9 %  sodium chloride infusion   ALLERGIES: Allergies  Allergen Reactions  . Nubain [Nalbuphine Hcl] Rash   VACCINATION STATUS: Immunization History  Administered Date(s) Administered  . Influenza Split 01/19/2014  . Influenza,inj,Quad PF,6+ Mos 01/14/2015, 02/02/2016, 02/08/2018  . Influenza-Unspecified 02/19/2014  . Tdap 12/11/2014    HPI [name] is a 63 year old gentleman with a medical history as above.  He is here to follow-up for hypogonadism on replacement therapy. -He reports better engagement with testosterone injection 100 mg IM weekly except for rare interruptions.  He continues to feel better, no new complaints today.   -He reports better energy level, better mobility. He was known to have low testosterone for at least since age 41.  He fathers 3 grown children. He denies history of head injury , has had nasal septal surgery multiple times. He denies injury to the testicles, exposure to chemotherapy, exposure to radiation to the genitals. However, due to chronic back injury and surgery he has exposure to heavy opioid therapy for pain control. He is currently on morphine pump. He has been off and  on  opioids for pain control for the last 10-15 years.   Review of Systems Constitutional:  + Progressive weight gain, no subjective hyperthermia or hypothermia.   Eyes: no blurry vision, no xerophthalmia ENT: no sore throat, no nodules palpated in throat, no dysphagia/odynophagia, no hoarseness  Musculoskeletal: He has diffuse large joint arthralgias including back pain, bilateral knee pain.   Skin: no rashes,  Genitourinary: Reports improved libido while taking testosterone Neurological: no tremors/numbness/tingling/dizziness Psychiatric: no depression/anxiety  Objective:    BP (!) 145/82 (BP Location: Right Arm, Patient Position: Sitting, Cuff Size: Normal)   Pulse (!) 55   Temp (!) 97.3  F (36.3 C) (Oral)   Ht 6\' 1"  (1.854 m)   Wt 265 lb (120.2 kg)   SpO2 99%   BMI 34.96 kg/m   Wt Readings from Last 3 Encounters:  10/23/18 265 lb (120.2 kg)  06/01/18 262 lb (118.8 kg)  04/24/18 258 lb (117 kg)     Physical Exam- Limited  Constitutional:  not in acute distress, normal state of mind Eyes:  EOMI, no exophthalmos Neck: Supple Respiratory: Adequate breathing efforts Musculoskeletal: no gross deformities, strength intact in all four extremities, no gross restriction of joint movements Skin:  no rashes, no hyperemia Neurological: no tremor with outstretched hands.  Genitourinary system: He  has bilaterally shrunk testicles, right testes 12 mL, left testes 15 mL. No scrotal mass, no varicocele,  no inguinal lymphadenopathy.    Recent Results (from the past 2160 hour(s))  Testosterone, Free, Total, SHBG     Status: None   Collection Time: 10/05/18  8:58 AM  Result Value Ref Range   Testosterone 506 264 - 916 ng/dL    Comment: Adult male reference interval is based on a population of healthy nonobese males (BMI <30) between 70 and 29 years old. [place], [place] [id number]. PMID: [id number].    Testosterone, Free 11.5 6.6 - 18.1 pg/mL   Sex Hormone Binding 20.6 19.3 - 76.4 nmol/L     Assessment & Plan:   1. Hypogonadism  -Testosterone replacement helped improve his testosterone from 74 ->286 -> 782 -> 509 > 978 > 659-> 437 >494> 327-> 509   No adverse events noted. 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   -It is possible that it is multifactorial including exposure to heavy opioids, multiple back surgeries, prior inguinal surgery, or idiopathic.  -Prolactin level is favorable at 6.1, he will not need pituitary/sella imaging for now.  -He has benefited from testosterone supplement and he wishes to continue on testosterone replacement therapy.   -He is advised to  continue testosterone 100 mg IM weekly  For target testosterone level of 400-600 mcg/dL.  -He we will return in 64-months with total and free testosterone measurements.  -His PSA is at target.  He will have CBC as part of his next labs.  He is due for his annual physical where he is expected to have a PSA, his insurance does not pay for more than 1 PSA testing.   - I advised patient to maintain close follow up with Dettinger, Jeff Kaufmann, MD for primary care needs.  Time for this visit: 15 minutes. Levonne Spiller [place]  participated in the discussions, expressed understanding, and voiced agreement with the above plans.  All questions were answered to his satisfaction. he is encouraged to contact clinic should he have any questions or concerns prior to his return visit.  Follow up plan: Return in about 4 months (around 02/22/2019), or fasting before 8AM, for Follow up with Pre-visit Labs.  Glade Lloyd, MD Phone: [phone]  Fax: [phone]   This note was partially dictated with voice recognition software. Similar sounding words can be transcribed inadequately or may not  be corrected upon review.  10/23/2018, 1:01 PM

## 2018-10-24 ENCOUNTER — Other Ambulatory Visit: Payer: Self-pay | Admitting: Family Medicine

## 2018-11-02 ENCOUNTER — Telehealth: Payer: Self-pay | Admitting: Family Medicine

## 2018-11-02 NOTE — Telephone Encounter (Signed)
lmtcb - he can be put in the virtual day.

## 2018-11-07 ENCOUNTER — Ambulatory Visit: Payer: Medicare Other | Admitting: Family Medicine

## 2018-11-07 DIAGNOSIS — M5136 Other intervertebral disc degeneration, lumbar region: Secondary | ICD-10-CM | POA: Diagnosis not present

## 2018-11-07 DIAGNOSIS — M5030 Other cervical disc degeneration, unspecified cervical region: Secondary | ICD-10-CM | POA: Diagnosis not present

## 2018-11-07 DIAGNOSIS — G894 Chronic pain syndrome: Secondary | ICD-10-CM | POA: Diagnosis not present

## 2018-11-07 DIAGNOSIS — M961 Postlaminectomy syndrome, not elsewhere classified: Secondary | ICD-10-CM | POA: Diagnosis not present

## 2018-11-08 NOTE — Telephone Encounter (Signed)
Patient has upcoming appt on 8/26.  This encounter will now be closed.

## 2018-11-14 ENCOUNTER — Ambulatory Visit (INDEPENDENT_AMBULATORY_CARE_PROVIDER_SITE_OTHER): Payer: Medicare Other | Admitting: Family Medicine

## 2018-11-14 ENCOUNTER — Encounter: Payer: Self-pay | Admitting: Family Medicine

## 2018-11-14 DIAGNOSIS — R0981 Nasal congestion: Secondary | ICD-10-CM | POA: Diagnosis not present

## 2018-11-14 DIAGNOSIS — K219 Gastro-esophageal reflux disease without esophagitis: Secondary | ICD-10-CM

## 2018-11-14 DIAGNOSIS — I10 Essential (primary) hypertension: Secondary | ICD-10-CM | POA: Diagnosis not present

## 2018-11-14 DIAGNOSIS — E291 Testicular hypofunction: Secondary | ICD-10-CM

## 2018-11-14 DIAGNOSIS — F419 Anxiety disorder, unspecified: Secondary | ICD-10-CM

## 2018-11-14 DIAGNOSIS — F329 Major depressive disorder, single episode, unspecified: Secondary | ICD-10-CM | POA: Diagnosis not present

## 2018-11-14 DIAGNOSIS — Z13220 Encounter for screening for lipoid disorders: Secondary | ICD-10-CM | POA: Diagnosis not present

## 2018-11-14 MED ORDER — HYDROCHLOROTHIAZIDE 25 MG PO TABS: 25.0000 mg | ORAL_TABLET | Freq: Every day | ORAL | 3 refills | Status: DC

## 2018-11-14 MED ORDER — CETIRIZINE HCL 10 MG PO TABS: 10.0000 mg | ORAL_TABLET | Freq: Every day | ORAL | 3 refills | Status: DC

## 2018-11-14 MED ORDER — FUROSEMIDE 40 MG PO TABS: 40.0000 mg | ORAL_TABLET | ORAL | 3 refills | Status: DC

## 2018-11-14 MED ORDER — LOSARTAN POTASSIUM 50 MG PO TABS: 50.0000 mg | ORAL_TABLET | Freq: Two times a day (BID) | ORAL | 3 refills | Status: DC

## 2018-11-14 MED ORDER — FLUTICASONE PROPIONATE 50 MCG/ACT NA SUSP: 2.0000 | Freq: Every day | NASAL | 11 refills | Status: DC

## 2018-11-14 MED ORDER — SERTRALINE HCL 100 MG PO TABS: 100.0000 mg | ORAL_TABLET | Freq: Every day | ORAL | 3 refills | Status: DC

## 2018-11-14 MED ORDER — OMEPRAZOLE 20 MG PO CPDR: 20.0000 mg | DELAYED_RELEASE_CAPSULE | Freq: Two times a day (BID) | ORAL | 3 refills | Status: DC

## 2018-11-14 NOTE — Progress Notes (Signed)
Virtual Visit via telephone Note  I connected with Jeff Stewart on 11/14/18 at 0830 by telephone and verified that I am speaking with the correct person using two identifiers. Jeff Stewart is currently located at home and no other people are currently with her during visit. The provider, Fransisca Kaufmann , MD is located in their office at time of visit.  Call ended at [phone]  I discussed the limitations, risks, security and privacy concerns of performing an evaluation and management service by telephone and the availability of in person appointments. I also discussed with the patient that there may be a patient responsible charge related to this service. The patient expressed understanding and agreed to proceed.   History and Present Illness: Hypertension Patient is currently on hctz and losartan, and their blood pressure today is unknown because he does not have a way to check it. Patient denies any lightheadedness or dizziness. Patient denies headaches, blurred vision, chest pains, shortness of breath, or weakness. Denies any side effects from medication and is content with current medication.   GERD Patient is currently on result twice daily.  She denies any major symptoms or abdominal pain or belching or burping. She denies any blood in her stool or lightheadedness or dizziness.  Anxiety Patient is coming in for anxiety recheck today.  He says that he is doing very well with his anxiety.  He denies any suicidal ideations or major anxiety going on with the coronavirus.  He is very content with where things are at life right now.  He takes sertraline currently  No diagnosis found.  Outpatient Encounter Medications as of 11/14/2018  Medication Sig  . aspirin EC 81 MG tablet Take 1 tablet (81 mg total) by mouth 2 (two) times daily. For DVT prophylaxis for 30 days after surgery.  . cetirizine (ZYRTEC) 10 MG tablet Take 1 tablet (10 mg total) by mouth daily.  . fluticasone (FLONASE)  50 MCG/ACT nasal spray SPRAY 1 SPRAY IN EACH NOSTRIL TWICE DAILY. SHAKE GENTLY BEFORE EACH USE.  . furosemide (LASIX) 40 MG tablet Take 1 tablet (40 mg total) by mouth every morning.  . hydrochlorothiazide (HYDRODIURIL) 25 MG tablet TAKE 1 TABLET DAILY  . losartan (COZAAR) 50 MG tablet Take 1 tablet (50 mg total) by mouth 2 (two) times daily. Take 50 mg 2 (two) times daily by mouth. (Patient taking differently: Take 50 mg by mouth 2 (two) times daily. )  . omeprazole (PRILOSEC) 20 MG capsule Take 1 capsule (20 mg total) by mouth 2 (two) times daily before a meal.  . sertraline (ZOLOFT) 100 MG tablet Take 1 tablet (100 mg total) by mouth daily. (Needs to be seen before next refill) (Patient taking differently: Take 100 mg by mouth daily. )  . Syringe/Needle, Disp, (SYRINGE 3CC/21GX1-1/4") 21G X 1-1/4" 3 ML MISC 1 each by Does not apply route once a week. (Patient not taking: Reported on 04/06/2018)  . testosterone cypionate (DEPOTESTOSTERONE CYPIONATE) 200 MG/ML injection INJECT 0.5 MLS INTRAMUSCULARY ONCE A WEEK   Facility-Administered Encounter Medications as of 11/14/2018  Medication  . 0.9 %  sodium chloride infusion    Review of Systems  Constitutional: Negative for chills, fatigue and fever.  Eyes: Negative for visual disturbance.  Respiratory: Negative for shortness of breath and wheezing.   Cardiovascular: Negative for chest pain and leg swelling.  Musculoskeletal: Negative for back pain and gait problem.  Skin: Negative for rash.  Neurological: Negative for dizziness, weakness and numbness.  Psychiatric/Behavioral:  Negative for decreased concentration and sleep disturbance. The patient is not nervous/anxious.   All other systems reviewed and are negative.   Observations/Objective: Patient sounds comfortable and in no acute distress  Assessment and Plan: Problem List Items Addressed This Visit      Cardiovascular and Mediastinum   Essential hypertension, benign - Primary    Relevant Medications   losartan (COZAAR) 50 MG tablet   hydrochlorothiazide (HYDRODIURIL) 25 MG tablet   furosemide (LASIX) 40 MG tablet   Other Relevant Orders   CMP14+EGFR     Digestive   GERD (gastroesophageal reflux disease)   Relevant Medications   omeprazole (PRILOSEC) 20 MG capsule   Other Relevant Orders   CBC with Differential/Platelet     Endocrine   Hypogonadism in male   Relevant Orders   PSA, total and free     Other   Anxiety and depression   Relevant Medications   sertraline (ZOLOFT) 100 MG tablet    Other Visit Diagnoses    Sinus congestion       Relevant Medications   fluticasone (FLONASE) 50 MCG/ACT nasal spray   Lipid screening       Relevant Orders   Lipid panel       Follow Up Instructions: Follow-up in 6 months  Patient appears to be doing well, no changes in medication    I discussed the assessment and treatment plan with the patient. The patient was provided an opportunity to ask questions and all were answered. The patient agreed with the plan and demonstrated an understanding of the instructions.   The patient was advised to call back or seek an in-person evaluation if the symptoms worsen or if the condition fails to improve as anticipated.  The above assessment and management plan was discussed with the patient. The patient verbalized understanding of and has agreed to the management plan. Patient is aware to call the clinic if symptoms persist or worsen. Patient is aware when to return to the clinic for a follow-up visit. Patient educated on when it is appropriate to go to the emergency department.    I provided 14 minutes of non-face-to-face time during this encounter.    Worthy Rancher, MD

## 2018-11-16 ENCOUNTER — Encounter: Payer: Self-pay | Admitting: Gastroenterology

## 2018-12-15 ENCOUNTER — Encounter: Payer: Medicare Other | Admitting: Gastroenterology

## 2018-12-21 ENCOUNTER — Other Ambulatory Visit: Payer: Self-pay

## 2018-12-21 ENCOUNTER — Ambulatory Visit (INDEPENDENT_AMBULATORY_CARE_PROVIDER_SITE_OTHER): Payer: Medicare Other

## 2018-12-21 DIAGNOSIS — Z23 Encounter for immunization: Secondary | ICD-10-CM

## 2018-12-25 ENCOUNTER — Other Ambulatory Visit: Payer: Self-pay

## 2018-12-25 ENCOUNTER — Ambulatory Visit (AMBULATORY_SURGERY_CENTER): Payer: Self-pay | Admitting: *Deleted

## 2018-12-25 VITALS — Temp 97.3°F | Ht 73.0 in | Wt 260.0 lb

## 2018-12-25 DIAGNOSIS — Z1211 Encounter for screening for malignant neoplasm of colon: Secondary | ICD-10-CM

## 2018-12-25 MED ORDER — PEG 3350-KCL-NA BICARB-NACL 420 G PO SOLR: 4000.0000 mL | Freq: Once | ORAL | 0 refills | Status: AC

## 2018-12-25 NOTE — Progress Notes (Signed)

## 2018-12-26 DIAGNOSIS — Z978 Presence of other specified devices: Secondary | ICD-10-CM | POA: Diagnosis not present

## 2018-12-26 DIAGNOSIS — M961 Postlaminectomy syndrome, not elsewhere classified: Secondary | ICD-10-CM | POA: Diagnosis not present

## 2018-12-26 DIAGNOSIS — M5136 Other intervertebral disc degeneration, lumbar region: Secondary | ICD-10-CM | POA: Diagnosis not present

## 2018-12-26 DIAGNOSIS — G894 Chronic pain syndrome: Secondary | ICD-10-CM | POA: Diagnosis not present

## 2019-01-07 ENCOUNTER — Telehealth: Payer: Self-pay

## 2019-01-07 NOTE — Telephone Encounter (Signed)
Covid-19 screening questions   Do you now or have you had a fever in the last 14 days?  Do you have any respiratory symptoms of shortness of breath or cough now or in the last 14 days?  Do you have any family members or close contacts with diagnosed or suspected Covid-19 in the past 14 days?  Have you been tested for Covid-19 and found to be positive?       

## 2019-01-07 NOTE — Telephone Encounter (Signed)
Patient called back and answered "NO" to all screening questions. °

## 2019-01-08 ENCOUNTER — Encounter: Payer: Self-pay | Admitting: Gastroenterology

## 2019-01-08 ENCOUNTER — Other Ambulatory Visit: Payer: Self-pay

## 2019-01-08 ENCOUNTER — Ambulatory Visit (AMBULATORY_SURGERY_CENTER): Payer: Medicare Other | Admitting: Gastroenterology

## 2019-01-08 VITALS — BP 114/68 | HR 58 | Temp 98.7°F | Resp 9 | Ht 73.0 in | Wt 260.0 lb

## 2019-01-08 DIAGNOSIS — K635 Polyp of colon: Secondary | ICD-10-CM | POA: Diagnosis not present

## 2019-01-08 DIAGNOSIS — D125 Benign neoplasm of sigmoid colon: Secondary | ICD-10-CM

## 2019-01-08 DIAGNOSIS — Z1211 Encounter for screening for malignant neoplasm of colon: Secondary | ICD-10-CM

## 2019-01-08 MED ORDER — SODIUM CHLORIDE 0.9 % IV SOLN: 500.0000 mL | Freq: Once | INTRAVENOUS | Status: DC

## 2019-01-08 NOTE — Progress Notes (Signed)
PT taken to PACU. Monitors in place. VSS. Report given to RN. 

## 2019-01-08 NOTE — Op Note (Signed)
[facility] Patient Name: Jeff Stewart Procedure Date: 01/08/2019 8:59 AM MRN: [id number] Endoscopist: Ladene Artist , MD Age: 63 Referring MD:  Date of Birth: Jul 28, 1955 Gender: Male Account #: [id number] Procedure:                Colonoscopy Indications:              Screening for colorectal malignant neoplasm Medicines:                Monitored Anesthesia Care Procedure:                Pre-Anesthesia Assessment:                           - Prior to the procedure, a History and Physical                            was performed, and patient medications and                            allergies were reviewed. The patient's tolerance of                            previous anesthesia was also reviewed. The risks                            and benefits of the procedure and the sedation                            options and risks were discussed with the patient.                            All questions were answered, and informed consent                            was obtained. Prior Anticoagulants: The patient has                            taken no previous anticoagulant or antiplatelet                            agents. ASA Grade Assessment: II - A patient with                            mild systemic disease. After reviewing the risks                            and benefits, the patient was deemed in                            satisfactory condition to undergo the procedure.                           After obtaining informed consent, the colonoscope  was passed under direct vision. Throughout the                            procedure, the patient's blood pressure, pulse, and                            oxygen saturations were monitored continuously. The                            Colonoscope was introduced through the anus and                            advanced to the the cecum, identified by                            appendiceal orifice and  ileocecal valve. The                            ileocecal valve, appendiceal orifice, and rectum                            were photographed. The quality of the bowel                            preparation was adequate. The colonoscopy was                            performed without difficulty. The patient tolerated                            the procedure well. Scope In: 9:14:49 AM Scope Out: 9:28:39 AM Scope Withdrawal Time: 0 hours 11 minutes 32 seconds  Total Procedure Duration: 0 hours 13 minutes 50 seconds  Findings:                 The perianal and digital rectal examinations were                            normal.                           A 6 mm polyp was found in the sigmoid colon. The                            polyp was sessile. The polyp was removed with a                            cold snare. Resection and retrieval were complete.                           Internal hemorrhoids were found during                            retroflexion. The hemorrhoids were small and Grade  I (internal hemorrhoids that do not prolapse).                           The exam was otherwise without abnormality on                            direct and retroflexion views. Complications:            No immediate complications. Estimated blood loss:                            None. Estimated Blood Loss:     Estimated blood loss: none. Impression:               - One 6 mm polyp in the sigmoid colon, removed with                            a cold snare. Resected and retrieved.                           - Internal hemorrhoids.                           - The examination was otherwise normal on direct                            and retroflexion views. Recommendation:           - Repeat colonoscopy after studies are complete for                            surveillance based on pathology results.                           - Patient has a contact number available for                             emergencies. The signs and symptoms of potential                            delayed complications were discussed with the                            patient. Return to normal activities tomorrow.                            Written discharge instructions were provided to the                            patient.                           - Resume previous diet.                           - Continue present medications.                           -  Await pathology results. Ladene Artist, MD 01/08/2019 9:34:20 AM This report has been signed electronically.

## 2019-01-08 NOTE — Progress Notes (Signed)
Called to room to assist during endoscopic procedure.  Patient ID and intended procedure confirmed with present staff. Received instructions for my participation in the procedure from the performing physician.  

## 2019-01-08 NOTE — Patient Instructions (Signed)
Discharge instructions given. Handouts on polyps and hemorrhoids. Resume previous medications. YOU HAD AN ENDOSCOPIC PROCEDURE TODAY AT THE [facility]:   Refer to the procedure report that was given to you for any specific questions about what was found during the examination.  If the procedure report does not answer your questions, please call your gastroenterologist to clarify.  If you requested that your care partner not be given the details of your procedure findings, then the procedure report has been included in a sealed envelope for you to review at your convenience later.  YOU SHOULD EXPECT: Some feelings of bloating in the abdomen. Passage of more gas than usual.  Walking can help get rid of the air that was put into your GI tract during the procedure and reduce the bloating. If you had a lower endoscopy (such as a colonoscopy or flexible sigmoidoscopy) you may notice spotting of blood in your stool or on the toilet paper. If you underwent a bowel prep for your procedure, you may not have a normal bowel movement for a few days.  Please Note:  You might notice some irritation and congestion in your nose or some drainage.  This is from the oxygen used during your procedure.  There is no need for concern and it should clear up in a day or so.  SYMPTOMS TO REPORT IMMEDIATELY:   Following lower endoscopy (colonoscopy or flexible sigmoidoscopy):  Excessive amounts of blood in the stool  Significant tenderness or worsening of abdominal pains  Swelling of the abdomen that is new, acute  Fever of 100F or higher   For urgent or emergent issues, a gastroenterologist can be reached at any hour by calling [phone].   DIET:  We do recommend a small meal at first, but then you may proceed to your regular diet.  Drink plenty of fluids but you should avoid alcoholic beverages for 24 hours.  ACTIVITY:  You should plan to take it easy for the rest of today and you should NOT DRIVE  or use heavy machinery until tomorrow (because of the sedation medicines used during the test).    FOLLOW UP: Our staff will call the number listed on your records 48-72 hours following your procedure to check on you and address any questions or concerns that you may have regarding the information given to you following your procedure. If we do not reach you, we will leave a message.  We will attempt to reach you two times.  During this call, we will ask if you have developed any symptoms of COVID 19. If you develop any symptoms (ie: fever, flu-like symptoms, shortness of breath, cough etc.) before then, please call [phone].  If you test positive for Covid 19 in the 2 weeks post procedure, please call and report this information to us.    If any biopsies were taken you will be contacted by phone or by letter within the next 1-3 weeks.  Please call us at [phone] if you have not heard about the biopsies in 3 weeks.    SIGNATURES/CONFIDENTIALITY: You and/or your care partner have signed paperwork which will be entered into your electronic medical record.  These signatures attest to the fact that that the information above on your After Visit Summary has been reviewed and is understood.  Full responsibility of the confidentiality of this discharge information lies with you and/or your care-partner. 

## 2019-01-08 NOTE — Progress Notes (Signed)
Temp check by JB.  Vital check by [place].  Patient states no changes in medical or surgical history since pre-visit screening on 12/25/2018

## 2019-01-10 ENCOUNTER — Encounter: Payer: Self-pay | Admitting: Gastroenterology

## 2019-01-10 ENCOUNTER — Telehealth: Payer: Self-pay

## 2019-01-10 NOTE — Telephone Encounter (Signed)
Covid-19 screening questions   Do you now or have you had a fever in the last 14 days? No.  Do you have any respiratory symptoms of shortness of breath or cough now or in the last 14 days? No.  Do you have any family members or close contacts with diagnosed or suspected Covid-19 in the past 14 days? No. Have you been tested for Covid-19 and found to be positive? No.       Follow up Call-  Call back number 01/08/2019  Post procedure Call Back phone  # [phone]  Permission to leave phone message Yes  Some recent data might be hidden     Patient questions:  Do you have a fever, pain , or abdominal swelling? No. Pain Score  0 *  Have you tolerated food without any problems? Yes.    Have you been able to return to your normal activities? Yes.    Do you have any questions about your discharge instructions: Diet   No. Medications  No. Follow up visit  No.  Do you have questions or concerns about your Care? No.  Actions: * If pain score is 4 or above: No action needed, pain <4.

## 2019-02-21 ENCOUNTER — Other Ambulatory Visit: Payer: Medicare Other

## 2019-02-21 ENCOUNTER — Other Ambulatory Visit: Payer: Self-pay

## 2019-02-22 ENCOUNTER — Other Ambulatory Visit: Payer: Medicare Other

## 2019-02-22 DIAGNOSIS — E291 Testicular hypofunction: Secondary | ICD-10-CM | POA: Diagnosis not present

## 2019-02-23 LAB — CBC WITH DIFFERENTIAL/PLATELET
Basophils Absolute: 0 10*3/uL (ref 0.0–0.2)
Basos: 1 %
EOS (ABSOLUTE): 0.1 10*3/uL (ref 0.0–0.4)
Eos: 2 %
Hematocrit: 44.1 % (ref 37.5–51.0)
Hemoglobin: 14.6 g/dL (ref 13.0–17.7)
Immature Grans (Abs): 0 10*3/uL (ref 0.0–0.1)
Immature Granulocytes: 0 %
Lymphocytes Absolute: 1.7 10*3/uL (ref 0.7–3.1)
Lymphs: 34 %
MCH: 28.2 pg (ref 26.6–33.0)
MCHC: 33.1 g/dL (ref 31.5–35.7)
MCV: 85 fL (ref 79–97)
Monocytes Absolute: 0.6 10*3/uL (ref 0.1–0.9)
Monocytes: 12 %
Neutrophils Absolute: 2.6 10*3/uL (ref 1.4–7.0)
Neutrophils: 51 %
Platelets: 177 10*3/uL (ref 150–450)
RBC: 5.17 x10E6/uL (ref 4.14–5.80)
RDW: 13 % (ref 11.6–15.4)
WBC: 5 10*3/uL (ref 3.4–10.8)

## 2019-02-23 LAB — TESTOSTERONE, FREE, TOTAL, SHBG
Sex Hormone Binding: 23.7 nmol/L (ref 19.3–76.4)
Testosterone, Free: 19.8 pg/mL — ABNORMAL HIGH (ref 6.6–18.1)
Testosterone: 640 ng/dL (ref 264–916)

## 2019-02-28 ENCOUNTER — Ambulatory Visit (INDEPENDENT_AMBULATORY_CARE_PROVIDER_SITE_OTHER): Payer: Medicare Other | Admitting: "Endocrinology

## 2019-02-28 ENCOUNTER — Encounter: Payer: Self-pay | Admitting: "Endocrinology

## 2019-02-28 DIAGNOSIS — E291 Testicular hypofunction: Secondary | ICD-10-CM

## 2019-02-28 MED ORDER — TESTOSTERONE CYPIONATE 200 MG/ML IM SOLN: INTRAMUSCULAR | 0 refills | Status: DC

## 2019-02-28 NOTE — Progress Notes (Signed)
02/28/2019                                Endocrinology Telehealth Visit Follow up Note -During COVID -19 Pandemic  I connected with Jeff Stewart on 02/28/2019   by telephone and verified that I am speaking with the correct person using two identifiers. Jeff Stewart, 25-Aug-1955. he has verbally consented to this visit. All issues noted in this document were discussed and addressed. The format was not optimal for physical exam.   Subjective:    Patient ID: Jeff Stewart, male    DOB: 08/26/1955, PCP Dettinger, Fransisca Kaufmann, MD   Past Medical History:  Diagnosis Date  . Anxiety   . Carpal tunnel syndrome, bilateral   . Cataract    removed years ago  . Chronic back pain    internal morphine pump  . DDD (degenerative disc disease)    neck, lumbar  . Depression   . Dyslipidemia    diet controlled  . GERD (gastroesophageal reflux disease)    past hx- had nissen fundiplication   . Hypertension    borderline  . Sleep apnea    wears C-PAP  . Status post insertion of spinal cord stimulator    Past Surgical History:  Procedure Laterality Date  . BACK SURGERY     x 6  . CARPAL TUNNEL RELEASE     bilateral  . COLONOSCOPY    . ELBOW SURGERY    . INGUINAL HERNIA REPAIR Right   . INTERNAL MORPHINE PUMP     . KNEE ARTHROSCOPY    . NASAL SEPTUM SURGERY     x 6  . neck fusion     . NISSEN FUNDOPLICATION    . SPINAL CORD STIMULATOR INSERTION    . STOMACH SURGERY     Nissen Fundiplication  . TOTAL KNEE ARTHROPLASTY Left 04/24/2018   Procedure: TOTAL KNEE ARTHROPLASTY;  Surgeon: Renette Butters, MD;  Location: [facility];  Service: Orthopedics;  Laterality: Left;  . UPPER GASTROINTESTINAL ENDOSCOPY     Social History   Socioeconomic History  . Marital status: Married    Spouse name: Not on file  . Number of children: 3  . Years of education: Not on file  . Highest education level: Not on file  Occupational History  . Occupation: [employer]ating and [place]: Retired   Tobacco Use  . Smoking status: Never Smoker  . Smokeless tobacco: Never Used  Substance and Sexual Activity  . Alcohol use: No  . Drug use: No  . Sexual activity: Not Currently    Birth control/protection: Post-menopausal    Comment: married for 1972  Other Topics Concern  . Not on file  Social History Narrative   Lives with wife    Social Determinants of Health   Financial Resource Strain:   . Difficulty of Paying Living Expenses: Not on file  Food Insecurity:   . Worried About Charity fundraiser in the Last Year: Not on file  . Ran Out of Food in the Last Year: Not on file  Transportation Needs:   . Lack of Transportation (Medical): Not on file  . Lack of Transportation (Non-Medical): Not on file  Physical Activity:   . Days of Exercise per Week: Not on file  . Minutes of Exercise per Session: Not on file  Stress:   . Feeling of Stress : Not on file  Social Connections:   . Frequency of Communication with Friends and Family: Not on file  . Frequency of Social Gatherings with Friends and Family: Not on file  . Attends Religious Services: Not on file  . Active Member of Clubs or Organizations: Not on file  . Attends Archivist Meetings: Not on file  . Marital Status: Not on file   Outpatient Encounter Medications as of 02/28/2019  Medication Sig  . acetaminophen (TYLENOL) 500 MG tablet Take 500 mg by mouth every 6 (six) hours as needed.  Marland Kitchen aspirin EC 81 MG tablet Take 1 tablet (81 mg total) by mouth 2 (two) times daily. For DVT prophylaxis for 30 days after surgery.  . cetirizine (ZYRTEC) 10 MG tablet Take 1 tablet (10 mg total) by mouth daily.  . fluticasone (FLONASE) 50 MCG/ACT nasal spray Place 2 sprays into both nostrils daily.  . furosemide (LASIX) 40 MG tablet Take 1 tablet (40 mg total) by mouth every morning.  . hydrochlorothiazide (HYDRODIURIL) 25 MG tablet Take 1 tablet (25 mg total) by mouth daily.  Marland Kitchen losartan (COZAAR) 50 MG tablet Take 1 tablet  (50 mg total) by mouth 2 (two) times daily. Take 50 mg 2 (two) times daily by mouth.  . morphine (ROXANOL) 20 MG/ML concentrated solution Take by mouth.  Marland Kitchen omeprazole (PRILOSEC) 20 MG capsule Take 1 capsule (20 mg total) by mouth 2 (two) times daily before a meal.  . oxyCODONE (OXY IR/ROXICODONE) 5 MG immediate release tablet   . sertraline (ZOLOFT) 100 MG tablet Take 1 tablet (100 mg total) by mouth daily. (Needs to be seen before next refill)  . Syringe/Needle, Disp, (SYRINGE 3CC/21GX1-1/4") 21G X 1-1/4" 3 ML MISC 1 each by Does not apply route once a week.  . testosterone cypionate (DEPOTESTOSTERONE CYPIONATE) 200 MG/ML injection INJECT 0.5 MLS INTRAMUSCULARY ONCE A WEEK  . Turmeric 500 MG CAPS Take by mouth.  . [DISCONTINUED] cetirizine (ZYRTEC) 10 MG tablet Take 1 tablet (10 mg total) by mouth daily.  . [DISCONTINUED] fluticasone (FLONASE) 50 MCG/ACT nasal spray SPRAY 1 SPRAY IN EACH NOSTRIL TWICE DAILY. SHAKE GENTLY BEFORE EACH USE.  . [DISCONTINUED] furosemide (LASIX) 40 MG tablet Take 1 tablet (40 mg total) by mouth every morning.  . [DISCONTINUED] hydrochlorothiazide (HYDRODIURIL) 25 MG tablet TAKE 1 TABLET DAILY  . [DISCONTINUED] losartan (COZAAR) 50 MG tablet Take 1 tablet (50 mg total) by mouth 2 (two) times daily. Take 50 mg 2 (two) times daily by mouth. (Patient taking differently: Take 50 mg by mouth 2 (two) times daily. )  . [DISCONTINUED] omeprazole (PRILOSEC) 20 MG capsule Take 1 capsule (20 mg total) by mouth 2 (two) times daily before a meal.  . [DISCONTINUED] sertraline (ZOLOFT) 100 MG tablet Take 1 tablet (100 mg total) by mouth daily. (Needs to be seen before next refill) (Patient taking differently: Take 100 mg by mouth daily. )  . [DISCONTINUED] testosterone cypionate (DEPOTESTOSTERONE CYPIONATE) 200 MG/ML injection INJECT 0.5 MLS INTRAMUSCULARY ONCE A WEEK   Facility-Administered Encounter Medications as of 02/28/2019  Medication  . 0.9 %  sodium chloride infusion    ALLERGIES: Allergies  Allergen Reactions  . Nubain [Nalbuphine Hcl] Rash   VACCINATION STATUS: Immunization History  Administered Date(s) Administered  . Fluad Quad(high Dose 65+) 12/21/2018  . Influenza Split 01/19/2014  . Influenza,inj,Quad PF,6+ Mos 01/14/2015, 02/02/2016, 02/08/2018  . Influenza-Unspecified 02/19/2014  . Tdap 12/11/2014    HPI Jeff Stewart is a 63 year old gentleman with a medical history as above.  He  is being engaged in telehealth via telephone to follow-up for hypogonadism on testosterone replacement therapy.   -He reports better engagement with testosterone injection 100 mg IM weekly except for rare interruptions.  He continues to feel better, no new complaints today.   -He reports better energy level, better mobility. He was known to have low testosterone for at least since age 33.  He is previsit labs show testosterone at 640.  He fathers 3 grown children. He denies history of head injury , has had nasal septal surgery multiple times. He denies injury to the testicles, exposure to chemotherapy, exposure to radiation to the genitals. However, due to chronic back injury and surgery he has exposure to heavy opioid therapy for pain control. He is currently on morphine pump. He has been off and  on  opioids for pain control for the last 10-15 years.   Review of Systems Limited as above.  Objective:    There were no vitals taken for this visit.  Wt Readings from Last 3 Encounters:  01/08/19 260 lb (117.9 kg)  12/25/18 260 lb (117.9 kg)  10/23/18 265 lb (120.2 kg)     Physical Exam- Limited From previous physical exam:  Genitourinary system: He has bilaterally shrunk testicles, right testes 12 mL, left testes 15 mL. No scrotal mass, no varicocele,  no inguinal lymphadenopathy.    Recent Results (from the past 2160 hour(s))  Testosterone, Free, Total, SHBG     Status: Abnormal   Collection Time: 02/22/19 12:00 AM  Result Value Ref Range    Testosterone 640 264 - 916 ng/dL    Comment: Adult male reference interval is based on a population of healthy nonobese males (BMI <30) between 61 and 73 years old. [place], [place] [phone]. PMID: [id number].    Testosterone, Free 19.8 (H) 6.6 - 18.1 pg/mL   Sex Hormone Binding 23.7 19.3 - 76.4 nmol/L  CBC with Differential/Platelet     Status: None   Collection Time: 02/22/19 12:00 AM  Result Value Ref Range   WBC 5.0 3.4 - 10.8 x10E3/uL   RBC 5.17 4.14 - 5.80 x10E6/uL   Hemoglobin 14.6 13.0 - 17.7 g/dL   Hematocrit 44.1 37.5 - 51.0 %   MCV 85 79 - 97 fL   MCH 28.2 26.6 - 33.0 pg   MCHC 33.1 31.5 - 35.7 g/dL   RDW 13.0 11.6 - 15.4 %   Platelets 177 150 - 450 x10E3/uL   Neutrophils 51 Not Estab. %   Lymphs 34 Not Estab. %   Monocytes 12 Not Estab. %   Eos 2 Not Estab. %   Basos 1 Not Estab. %   Neutrophils Absolute 2.6 1.4 - 7.0 x10E3/uL   Lymphocytes Absolute 1.7 0.7 - 3.1 x10E3/uL   Monocytes Absolute 0.6 0.1 - 0.9 x10E3/uL   EOS (ABSOLUTE) 0.1 0.0 - 0.4 x10E3/uL   Basophils Absolute 0.0 0.0 - 0.2 x10E3/uL   Immature Granulocytes 0 Not Estab. %   Immature Grans (Abs) 0.0 0.0 - 0.1 x10E3/uL     Assessment & Plan:   1. Hypogonadism  -Testosterone replacement helped improve his testosterone from 74 ->286 -> 782 -> 509 > 978 > 659-> 437 >494> 327-> 506-> 640   No adverse events noted. 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   -It is possible that it is multifactorial including exposure to heavy opioids, multiple back surgeries, prior inguinal surgery, or idiopathic.  -Prolactin level  is favorable at 6.1, he will not need pituitary/sella imaging for now.  -He has benefited from testosterone replacement therapy and he wishes to continue on treatment.    -He is advised to continue testosterone 100 mg IM weekly  for target testosterone level of 400-600 mcg/dL.  -He we will return in  61-months with total and free testosterone measurements.  -Prior to his last visit PSA was on target.  He is advised to request for PSA during his next annual physical with his PMD.    - I advised patient to maintain close follow up with Dettinger, Fransisca Kaufmann, MD for primary care needs.   Time for this visit: 15 minutes. Levonne Spiller Bither  participated in the discussions, expressed understanding, and voiced agreement with the above plans.  All questions were answered to his satisfaction. he is encouraged to contact clinic should he have any questions or concerns prior to his return visit.   Follow up plan: Return in about 4 months (around 06/29/2019), or Fasting B4 8AM, for Follow up with Pre-visit Labs.  Glade Lloyd, MD Phone: [phone]  Fax: [phone]   This note was partially dictated with voice recognition software. Similar sounding words can be transcribed inadequately or may not  be corrected upon review.  02/28/2019, 12:49 PM

## 2019-03-11 DIAGNOSIS — S61211A Laceration without foreign body of left index finger without damage to nail, initial encounter: Secondary | ICD-10-CM | POA: Diagnosis not present

## 2019-04-02 DIAGNOSIS — M5030 Other cervical disc degeneration, unspecified cervical region: Secondary | ICD-10-CM | POA: Diagnosis not present

## 2019-04-02 DIAGNOSIS — G894 Chronic pain syndrome: Secondary | ICD-10-CM | POA: Diagnosis not present

## 2019-04-02 DIAGNOSIS — M5136 Other intervertebral disc degeneration, lumbar region: Secondary | ICD-10-CM | POA: Diagnosis not present

## 2019-04-02 DIAGNOSIS — Z5181 Encounter for therapeutic drug level monitoring: Secondary | ICD-10-CM | POA: Diagnosis not present

## 2019-04-02 DIAGNOSIS — M542 Cervicalgia: Secondary | ICD-10-CM | POA: Diagnosis not present

## 2019-04-02 DIAGNOSIS — Z79899 Other long term (current) drug therapy: Secondary | ICD-10-CM | POA: Diagnosis not present

## 2019-05-22 DIAGNOSIS — G894 Chronic pain syndrome: Secondary | ICD-10-CM | POA: Diagnosis not present

## 2019-05-22 DIAGNOSIS — M5136 Other intervertebral disc degeneration, lumbar region: Secondary | ICD-10-CM | POA: Diagnosis not present

## 2019-05-22 DIAGNOSIS — M5030 Other cervical disc degeneration, unspecified cervical region: Secondary | ICD-10-CM | POA: Diagnosis not present

## 2019-05-22 DIAGNOSIS — Z978 Presence of other specified devices: Secondary | ICD-10-CM | POA: Diagnosis not present

## 2019-06-12 IMAGING — DX DG KNEE 1-2V PORT*L*
2 series · 2 of 2 positions shown · non-contrast
Comparison: 01/29/2018 MR

CLINICAL DATA: Status post LEFT knee replacement.

EXAM:
PORTABLE LEFT KNEE - 1-2 VIEW

[knee ap]
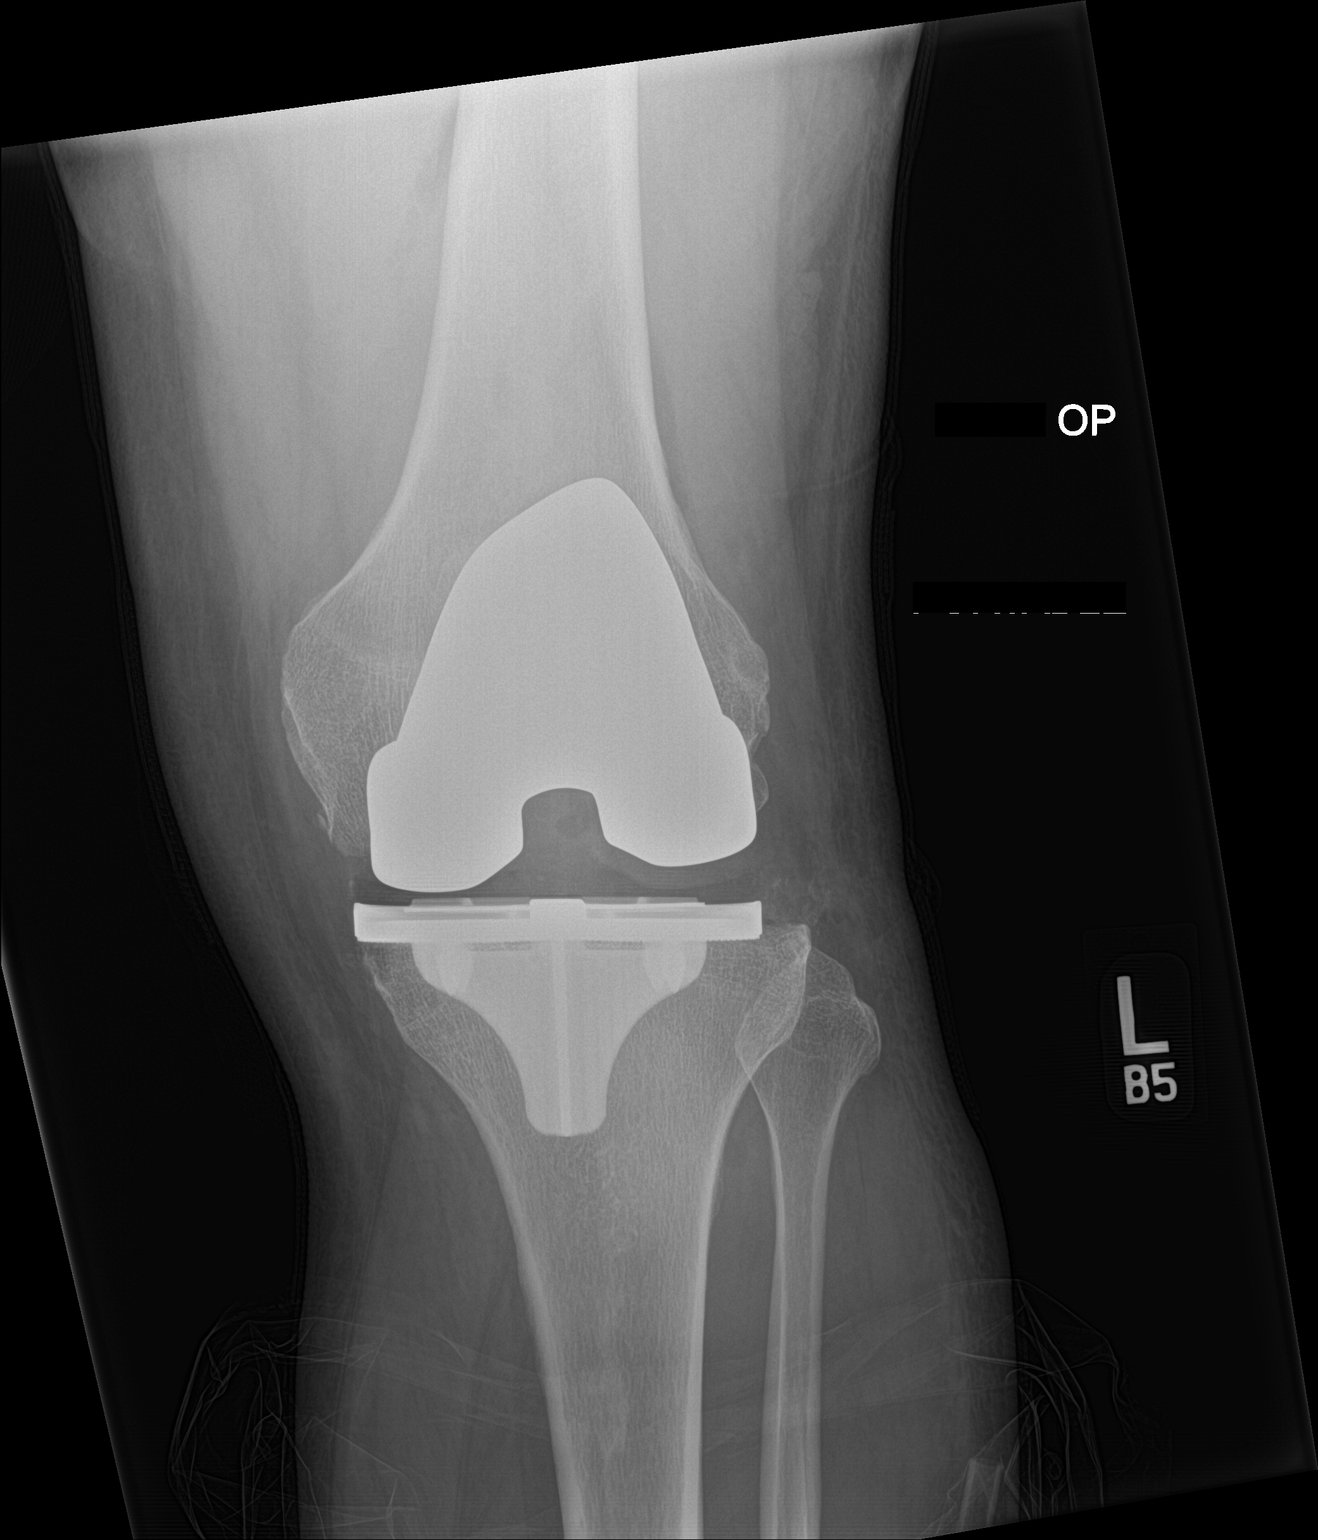

[knee lat]
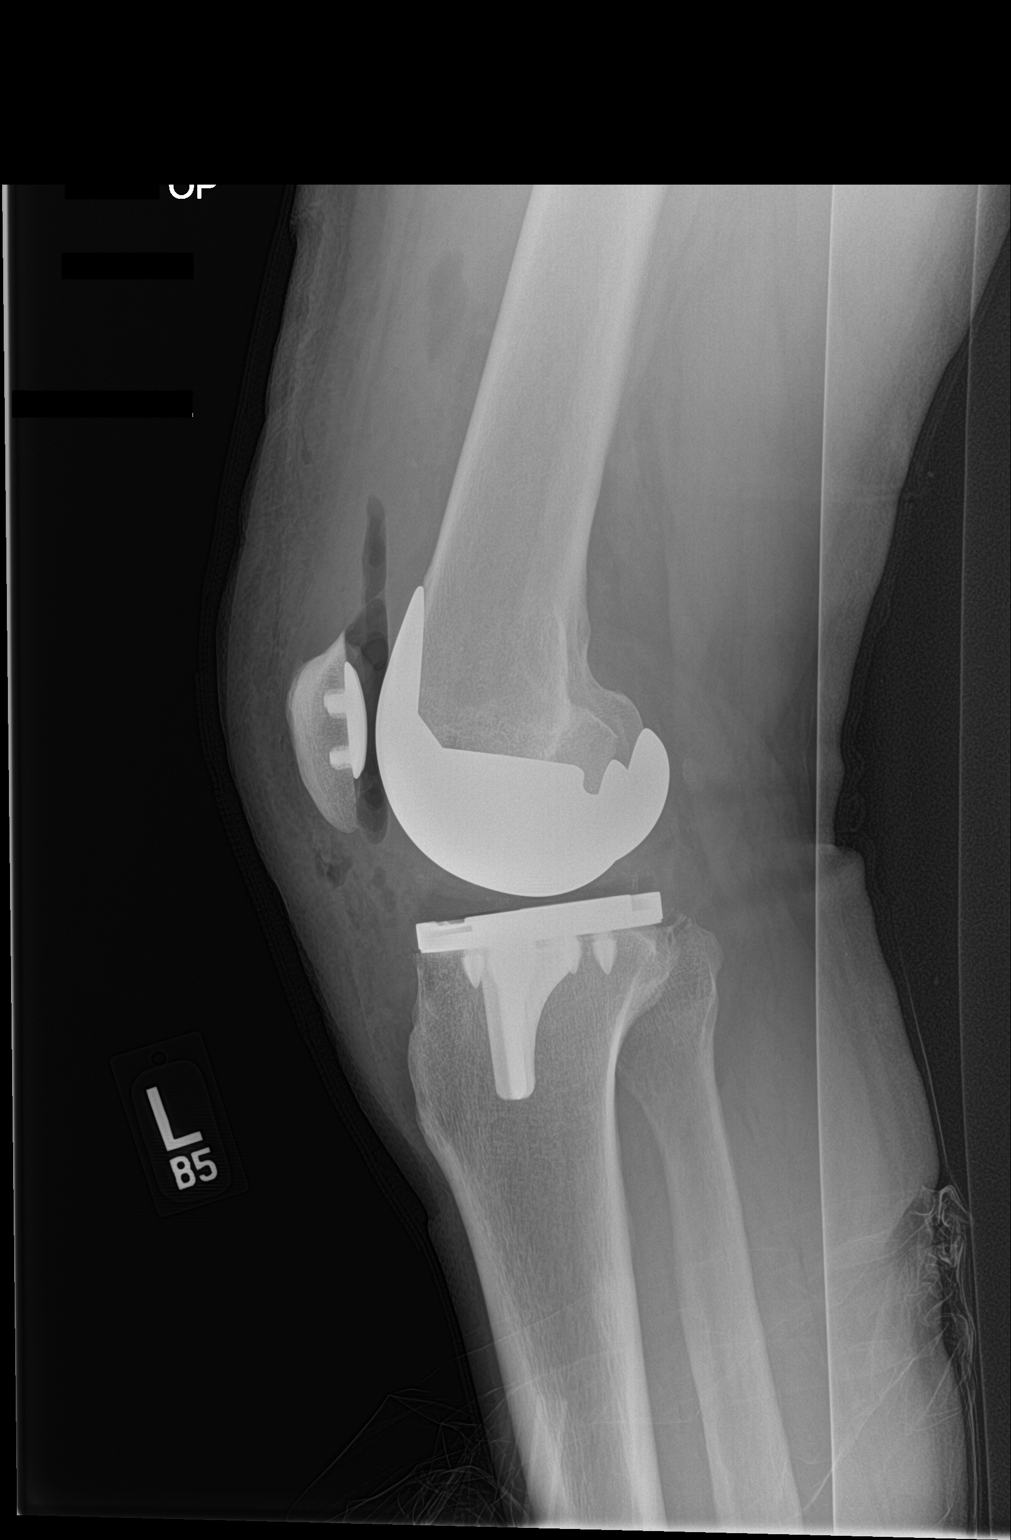

[2 of 2 positions shown; findings below may reference images not displayed]

FINDINGS: LEFT total knee arthroplasty identified without complicating
features.

Expected postoperative soft tissue changes noted..
IMPRESSION: LEFT total knee arthroplasty without complicating features.

## 2019-06-26 ENCOUNTER — Telehealth: Payer: Self-pay | Admitting: "Endocrinology

## 2019-06-26 ENCOUNTER — Other Ambulatory Visit: Payer: Self-pay

## 2019-06-26 ENCOUNTER — Other Ambulatory Visit: Payer: Medicare Other

## 2019-06-26 DIAGNOSIS — E291 Testicular hypofunction: Secondary | ICD-10-CM | POA: Diagnosis not present

## 2019-06-26 NOTE — Telephone Encounter (Signed)
Lab orders changed to [facility] 

## 2019-06-26 NOTE — Telephone Encounter (Signed)
Can you change order from [facility] to [facility]

## 2019-06-28 LAB — TESTOSTERONE, FREE, TOTAL, SHBG
Sex Hormone Binding: 20.7 nmol/L (ref 19.3–76.4)
Testosterone, Free: 15.2 pg/mL (ref 6.6–18.1)
Testosterone: 673 ng/dL (ref 264–916)

## 2019-07-03 DIAGNOSIS — G4730 Sleep apnea, unspecified: Secondary | ICD-10-CM | POA: Diagnosis not present

## 2019-07-03 DIAGNOSIS — M5416 Radiculopathy, lumbar region: Secondary | ICD-10-CM | POA: Diagnosis not present

## 2019-07-03 DIAGNOSIS — T85615A Breakdown (mechanical) of other nervous system device, implant or graft, initial encounter: Secondary | ICD-10-CM | POA: Diagnosis not present

## 2019-07-03 DIAGNOSIS — Z9119 Patient's noncompliance with other medical treatment and regimen: Secondary | ICD-10-CM | POA: Diagnosis not present

## 2019-07-03 DIAGNOSIS — M1990 Unspecified osteoarthritis, unspecified site: Secondary | ICD-10-CM | POA: Diagnosis not present

## 2019-07-03 DIAGNOSIS — G894 Chronic pain syndrome: Secondary | ICD-10-CM | POA: Diagnosis not present

## 2019-07-03 DIAGNOSIS — E669 Obesity, unspecified: Secondary | ICD-10-CM | POA: Diagnosis not present

## 2019-07-03 DIAGNOSIS — K219 Gastro-esophageal reflux disease without esophagitis: Secondary | ICD-10-CM | POA: Diagnosis not present

## 2019-07-03 DIAGNOSIS — F329 Major depressive disorder, single episode, unspecified: Secondary | ICD-10-CM | POA: Diagnosis not present

## 2019-07-03 DIAGNOSIS — Z6833 Body mass index (BMI) 33.0-33.9, adult: Secondary | ICD-10-CM | POA: Diagnosis not present

## 2019-07-04 ENCOUNTER — Ambulatory Visit (INDEPENDENT_AMBULATORY_CARE_PROVIDER_SITE_OTHER): Payer: Medicare Other | Admitting: "Endocrinology

## 2019-07-04 ENCOUNTER — Encounter: Payer: Self-pay | Admitting: "Endocrinology

## 2019-07-04 ENCOUNTER — Other Ambulatory Visit: Payer: Self-pay

## 2019-07-04 VITALS — BP 158/89 | HR 66 | Ht 73.0 in | Wt 256.2 lb

## 2019-07-04 DIAGNOSIS — E291 Testicular hypofunction: Secondary | ICD-10-CM | POA: Diagnosis not present

## 2019-07-04 MED ORDER — TESTOSTERONE CYPIONATE 200 MG/ML IM SOLN: INTRAMUSCULAR | 1 refills | Status: DC

## 2019-07-04 NOTE — Progress Notes (Signed)
07/04/2019                     Endocrinology follow-up note   Subjective:    Patient ID: Jeff Stewart, male    DOB: 05-08-1955, PCP Dettinger, Fransisca Kaufmann, Jeff Stewart   Past Medical History:  Diagnosis Date  . Anxiety   . Carpal tunnel syndrome, bilateral   . Cataract    removed years ago  . Chronic back pain    internal morphine pump  . DDD (degenerative disc disease)    neck, lumbar  . Depression   . Dyslipidemia    diet controlled  . GERD (gastroesophageal reflux disease)    past hx- had nissen fundiplication   . Hypertension    borderline  . Sleep apnea    wears C-PAP  . Status post insertion of spinal cord stimulator    Past Surgical History:  Procedure Laterality Date  . BACK SURGERY     x 6  . CARPAL TUNNEL RELEASE     bilateral  . COLONOSCOPY    . ELBOW SURGERY    . INGUINAL HERNIA REPAIR Right   . INTERNAL MORPHINE PUMP     . KNEE ARTHROSCOPY    . NASAL SEPTUM SURGERY     x 6  . neck fusion     . NISSEN FUNDOPLICATION    . SPINAL CORD STIMULATOR INSERTION    . STOMACH SURGERY     Nissen Fundiplication  . TOTAL KNEE ARTHROPLASTY Left 04/24/2018   Procedure: TOTAL KNEE ARTHROPLASTY;  Surgeon: Renette Butters, Jeff Stewart;  Location: [facility];  Service: Orthopedics;  Laterality: Left;  . UPPER GASTROINTESTINAL ENDOSCOPY     Social History   Socioeconomic History  . Marital status: Married    Spouse name: Not on file  . Number of children: 3  . Years of education: Not on file  . Highest education level: Not on file  Occupational History  . Occupation: [employer]ating and [place]: Retired  Tobacco Use  . Smoking status: Never Smoker  . Smokeless tobacco: Never Used  Substance and Sexual Activity  . Alcohol use: No  . Drug use: No  . Sexual activity: Not Currently    Birth control/protection: Post-menopausal    Comment: married for 1972  Other Topics Concern  . Not on file  Social History Narrative   Lives with wife    Social Determinants of Health    Financial Resource Strain:   . Difficulty of Paying Living Expenses:   Food Insecurity:   . Worried About Charity fundraiser in the Last Year:   . Arboriculturist in the Last Year:   Transportation Needs:   . Film/video editor (Medical):   Marland Kitchen Lack of Transportation (Non-Medical):   Physical Activity:   . Days of Exercise per Week:   . Minutes of Exercise per Session:   Stress:   . Feeling of Stress :   Social Connections:   . Frequency of Communication with Friends and Family:   . Frequency of Social Gatherings with Friends and Family:   . Attends Religious Services:   . Active Member of Clubs or Organizations:   . Attends Archivist Meetings:   Marland Kitchen Marital Status:    Outpatient Encounter Medications as of 07/04/2019  Medication Sig  . acetaminophen (TYLENOL) 500 MG tablet Take 500 mg by mouth every 6 (six) hours as needed.  . cetirizine (ZYRTEC) 10 MG tablet Take  1 tablet (10 mg total) by mouth daily.  . fluticasone (FLONASE) 50 MCG/ACT nasal spray Place 2 sprays into both nostrils daily.  . furosemide (LASIX) 40 MG tablet Take 1 tablet (40 mg total) by mouth every morning.  . hydrochlorothiazide (HYDRODIURIL) 25 MG tablet Take 1 tablet (25 mg total) by mouth daily.  Marland Kitchen losartan (COZAAR) 50 MG tablet Take 1 tablet (50 mg total) by mouth 2 (two) times daily. Take 50 mg 2 (two) times daily by mouth.  Marland Kitchen omeprazole (PRILOSEC) 20 MG capsule Take 1 capsule (20 mg total) by mouth 2 (two) times daily before a meal.  . sertraline (ZOLOFT) 100 MG tablet Take 1 tablet (100 mg total) by mouth daily. (Needs to be seen before next refill)  . Syringe/Needle, Disp, (SYRINGE 3CC/21GX1-1/4") 21G X 1-1/4" 3 ML MISC 1 each by Does not apply route once a week.  . testosterone cypionate (DEPOTESTOSTERONE CYPIONATE) 200 MG/ML injection INJECT 0.5 MLS INTRAMUSCULARY ONCE A WEEK  . Turmeric 500 MG CAPS Take by mouth.  . [DISCONTINUED] aspirin EC 81 MG tablet Take 1 tablet (81 mg total) by  mouth 2 (two) times daily. For DVT prophylaxis for 30 days after surgery.  . [DISCONTINUED] morphine (ROXANOL) 20 MG/ML concentrated solution Take by mouth.  . [DISCONTINUED] oxyCODONE (OXY IR/ROXICODONE) 5 MG immediate release tablet   . [DISCONTINUED] testosterone cypionate (DEPOTESTOSTERONE CYPIONATE) 200 MG/ML injection INJECT 0.5 MLS INTRAMUSCULARY ONCE A WEEK   Facility-Administered Encounter Medications as of 07/04/2019  Medication  . 0.9 %  sodium chloride infusion   ALLERGIES: Allergies  Allergen Reactions  . Nubain [Nalbuphine Hcl] Rash   VACCINATION STATUS: Immunization History  Administered Date(s) Administered  . Fluad Quad(high Dose 65+) 12/21/2018  . Influenza Split 01/19/2014  . Influenza,inj,Quad PF,6+ Mos 01/14/2015, 02/02/2016, 02/08/2018  . Influenza-Unspecified 02/19/2014  . Tdap 12/11/2014    HPI Jeff Stewart is a 64 year old gentleman with a medical history as above.  He is being seen in follow-up for hypogonadism on testosterone replacement therapy.   -He reports better engagement with testosterone injection 100 mg IM weekly. He continues to feel better, no new complaints today.   -He reports better energy level, better mobility. He was known to have low testosterone for at least since age 11.  His previsit labs show total testosterone of 673 ng per DL.   He fathers 3 grown children. He denies history of head injury , has had nasal septal surgery multiple times. He denies injury to the testicles, exposure to chemotherapy, exposure to radiation to the genitals. However, due to chronic back injury and surgery he has exposure to heavy opioid therapy for pain control. He is currently on morphine pump. He has been off and  on  opioids for pain control for the last 10-15 years.   Review of Systems Limited as above.  Objective:    BP (!) 158/89   Pulse 66   Ht 6\' 1"  (1.854 m)   Wt 256 lb 3.2 oz (116.2 kg)   BMI 33.80 kg/m   Wt Readings from Last 3  Encounters:  07/04/19 256 lb 3.2 oz (116.2 kg)  01/08/19 260 lb (117.9 kg)  12/25/18 260 lb (117.9 kg)      Physical Exam- Limited  Constitutional:  Body mass index is 33.8 kg/m. , not in acute distress, normal state of mind Eyes:  EOMI, no exophthalmos Neck: Supple Thyroid: No gross goiter Respiratory: Adequate breathing efforts Musculoskeletal: no gross deformities, strength intact in all four extremities, no gross  restriction of joint movements Skin:  no rashes, no hyperemia Neurological: no tremor with outstretched hands,    Genitourinary system: He has bilaterally shrunk testicles, right testes 12 mL, left testes 15 mL. No scrotal mass, no varicocele,  no inguinal lymphadenopathy.    Recent Results (from the past 2160 hour(s))  Testosterone, Free, Total, SHBG     Status: None   Collection Time: 06/26/19  8:42 AM  Result Value Ref Range   Testosterone 673 264 - 916 ng/dL    Comment: Adult male reference interval is based on a population of healthy nonobese males (BMI <30) between 68 and 3 years old. [place], [place] [id number]. PMID: [id number].    Testosterone, Free 15.2 6.6 - 18.1 pg/mL   Sex Hormone Binding 20.7 19.3 - 76.4 nmol/L     Assessment & Plan:   1. Hypogonadism  -Testosterone replacement helped improve his testosterone from 74 ->286 -> 782 -> 509 > 978 > 659-> 437 >494> 327-> 506-> 640 -> 673   No adverse events noted. 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   -It is possible that it is multifactorial including exposure to heavy opioids, multiple back surgeries, prior inguinal surgery, or idiopathic.  -Prolactin level is favorable at 6.1, he will not need pituitary/sella imaging for now.  -He has benefited from testosterone replacement therapy and he wishes to continue on treatment.    -He is advised to continue testosterone 100 mg IM weekly  for target  testosterone level of 400-600 mcg/dL.   -He we will return in 20-months with total and free testosterone measurements.  -Prior to his last visit PSA was on target.  He is advised to make scheduled with his PMD for annual physical to include PSA.      - I advised patient to maintain close follow up with Dettinger, Fransisca Kaufmann, Jeff Stewart for primary care needs.      - Time spent on this patient care encounter:  20 minutes of which 50% was spent in  counseling and the rest reviewing  his current and  previous labs / studies and medications  doses and developing a plan for long term care. Levonne Spiller Feliciano  participated in the discussions, expressed understanding, and voiced agreement with the above plans.  All questions were answered to his satisfaction. he is encouraged to contact clinic should he have any questions or concerns prior to his return visit.   Follow up plan: Return in about 4 months (around 11/03/2019) for Follow up with Pre-visit Labs.  Jeff Lloyd, Jeff Stewart Phone: [phone]  Fax: [phone]   This note was partially dictated with voice recognition software. Similar sounding words can be transcribed inadequately or may not  be corrected upon review.  07/04/2019, 12:52 PM

## 2019-07-09 ENCOUNTER — Telehealth: Payer: Self-pay | Admitting: Family Medicine

## 2019-07-09 NOTE — Chronic Care Management (AMB) (Signed)
  Chronic Care Management   Outreach Note  07/09/2019 Name: NALU LABA MRN: [id number] DOB: 21-Apr-1955  JAMSE SEROKA is a 64 y.o. year old male who is a primary care patient of Dettinger, Fransisca Kaufmann, MD. I reached out to Basilio Cairo by phone today in response to a referral sent by Mr. Levonne Spiller Krager's health plan.     An unsuccessful telephone outreach was attempted today. The patient was referred to the case management team for assistance with care management and care coordination.   Follow Up Plan: A HIPPA compliant phone message was left for the patient providing contact information and requesting a return call. The care management team will reach out to the patient again over the next 7 days. If patient returns call to provider office, please advise to call [place] at [phone].  [place], [place] [address] Direct Dial: [phone] [email or web address] Website: [email or web address]

## 2019-07-10 NOTE — Chronic Care Management (AMB) (Signed)
  Chronic Care Management   Note  07/10/2019 Name: BERNELL SIGAL MRN: [id number] DOB: 09/17/1955  EUFEMIO STRAHM is a 64 y.o. year old male who is a primary care patient of Dettinger, Fransisca Kaufmann, MD. I reached out to Basilio Cairo by phone today in response to a referral sent by Mr. Levonne Spiller Basic's health plan.     Mr. Kohrs was given information about Chronic Care Management services today including:  1. CCM service includes personalized support from designated clinical staff supervised by his physician, including individualized plan of care and coordination with other care providers 2. 24/7 contact phone numbers for assistance for urgent and routine care needs. 3. Service will only be billed when office clinical staff spend 20 minutes or more in a month to coordinate care. 4. Only one practitioner may furnish and bill the service in a calendar month. 5. The patient may stop CCM services at any time (effective at the end of the month) by phone call to the office staff. 6. The patient will be responsible for cost sharing (co-pay) of up to 20% of the service fee (after annual deductible is met).  Patient did not agree to services and wishes to consider information provided before deciding about enrollment in care management services.   Follow up plan: The patient has been provided with contact information for the care management team and has been advised to call with any health related questions or concerns. If patient returns call to provider office, please advise to call [facility] at [phone].  [place], [place] [address] Direct Dial: [phone] [email or web address]: [email or web address]

## 2019-08-09 ENCOUNTER — Ambulatory Visit (INDEPENDENT_AMBULATORY_CARE_PROVIDER_SITE_OTHER): Payer: Medicare Other | Admitting: Family Medicine

## 2019-08-09 ENCOUNTER — Other Ambulatory Visit: Payer: Self-pay

## 2019-08-09 ENCOUNTER — Encounter: Payer: Self-pay | Admitting: Family Medicine

## 2019-08-09 VITALS — BP 132/78 | HR 71 | Temp 98.0°F | Ht 73.0 in | Wt 253.4 lb

## 2019-08-09 DIAGNOSIS — Z23 Encounter for immunization: Secondary | ICD-10-CM

## 2019-08-09 DIAGNOSIS — Z13220 Encounter for screening for lipoid disorders: Secondary | ICD-10-CM

## 2019-08-09 DIAGNOSIS — N400 Enlarged prostate without lower urinary tract symptoms: Secondary | ICD-10-CM | POA: Diagnosis not present

## 2019-08-09 DIAGNOSIS — K219 Gastro-esophageal reflux disease without esophagitis: Secondary | ICD-10-CM

## 2019-08-09 DIAGNOSIS — I10 Essential (primary) hypertension: Secondary | ICD-10-CM | POA: Diagnosis not present

## 2019-08-09 NOTE — Progress Notes (Signed)
BP 132/78   Pulse 71   Temp 98 F (36.7 C) (Temporal)   Ht '6\' 1"'$  (1.854 m)   Wt 253 lb 6 oz (114.9 kg)   BMI 33.43 kg/m    Subjective:   Patient ID: Jeff Stewart, male    DOB: 09-22-1955, 64 y.o.   MRN: [id number]  HPI: Jeff Stewart is a 64 y.o. male presenting on 08/09/2019 for Medical Management of Chronic Issues   HPI Hypertension Patient is currently on losartan hydrochlorothiazide, and their blood pressure today is 132/78. Patient denies any lightheadedness or dizziness. Patient denies headaches, blurred vision, chest pains, shortness of breath, or weakness. Denies any side effects from medication and is content with current medication.   GERD Patient is currently on omeprazole.  She denies any major symptoms or abdominal pain or belching or burping. She denies any blood in her stool or lightheadedness or dizziness.   Patient is also coming in today for prostate cancer screening and checkup.  He says he has little rectal discomfort and thinks he may have a hemorrhoid but wants to have it looked at.  Relevant past medical, surgical, family and social history reviewed and updated as indicated. Interim medical history since our last visit reviewed. Allergies and medications reviewed and updated.  Review of Systems  Constitutional: Negative for chills and fever.  Eyes: Negative for discharge.  Respiratory: Negative for shortness of breath and wheezing.   Cardiovascular: Negative for chest pain and leg swelling.  Gastrointestinal: Positive for rectal pain. Negative for blood in stool, constipation, nausea and vomiting.  Musculoskeletal: Negative for back pain and gait problem.  Skin: Negative for rash.  All other systems reviewed and are negative.   Per HPI unless specifically indicated above   Allergies as of 08/09/2019      Reactions   Nalbuphine Nausea And Vomiting, Rash, Shortness Of Breath   Nubain [nalbuphine Hcl] Rash      Medication List       Accurate as of Aug 09, 2019  2:34 PM. If you have any questions, ask your nurse or doctor.        acetaminophen 500 MG tablet Commonly known as: TYLENOL Take 500 mg by mouth every 6 (six) hours as needed.   cetirizine 10 MG tablet Commonly known as: ZYRTEC Take 1 tablet (10 mg total) by mouth daily.   fluticasone 50 MCG/ACT nasal spray Commonly known as: FLONASE Place 2 sprays into both nostrils daily.   furosemide 40 MG tablet Commonly known as: LASIX Take 1 tablet (40 mg total) by mouth every morning.   hydrochlorothiazide 25 MG tablet Commonly known as: HYDRODIURIL Take 1 tablet (25 mg total) by mouth daily.   losartan 50 MG tablet Commonly known as: COZAAR Take 1 tablet (50 mg total) by mouth 2 (two) times daily. Take 50 mg 2 (two) times daily by mouth.   omeprazole 20 MG capsule Commonly known as: PRILOSEC Take 1 capsule (20 mg total) by mouth 2 (two) times daily before a meal.   sertraline 100 MG tablet Commonly known as: ZOLOFT Take 1 tablet (100 mg total) by mouth daily. (Needs to be seen before next refill)   SYRINGE 3CC/21GX1-1/4" 21G X 1-1/4" 3 ML Misc 1 each by Does not apply route once a week.   testosterone cypionate 200 MG/ML injection Commonly known as: DEPOTESTOSTERONE CYPIONATE INJECT 0.5 MLS INTRAMUSCULARY ONCE A WEEK   Turmeric 500 MG Caps Take by mouth.  Objective:   BP 132/78   Pulse 71   Temp 98 F (36.7 C) (Temporal)   Ht '6\' 1"'$  (1.854 m)   Wt 253 lb 6 oz (114.9 kg)   BMI 33.43 kg/m   Wt Readings from Last 3 Encounters:  08/09/19 253 lb 6 oz (114.9 kg)  07/04/19 256 lb 3.2 oz (116.2 kg)  01/08/19 260 lb (117.9 kg)    Physical Exam Vitals and nursing note reviewed.  Constitutional:      General: He is not in acute distress.    Appearance: He is well-developed. He is not diaphoretic.  Eyes:     General: No scleral icterus.    Conjunctiva/sclera: Conjunctivae normal.  Neck:     Thyroid: No thyromegaly.    Cardiovascular:     Rate and Rhythm: Normal rate and regular rhythm.     Heart sounds: Normal heart sounds. No murmur.  Pulmonary:     Effort: Pulmonary effort is normal. No respiratory distress.     Breath sounds: Normal breath sounds. No wheezing.  Musculoskeletal:        General: Normal range of motion.     Cervical back: Neck supple.  Lymphadenopathy:     Cervical: No cervical adenopathy.  Skin:    General: Skin is warm and dry.     Findings: Rash (Patient has a perianal dermatitis, recommended antifungal cream and continue Preparation H) present.  Neurological:     Mental Status: He is alert and oriented to person, place, and time.     Coordination: Coordination normal.  Psychiatric:        Behavior: Behavior normal.       Assessment & Plan:   Problem List Items Addressed This Visit      Cardiovascular and Mediastinum   Essential hypertension, benign   Relevant Orders   CBC with Differential/Platelet     Digestive   GERD (gastroesophageal reflux disease) - Primary   Relevant Orders   CMP14+EGFR    Other Visit Diagnoses    Benign prostatic hyperplasia without lower urinary tract symptoms       Relevant Orders   PSA, total and free   Lipid screening       Relevant Orders   Lipid panel      No change in current medication, will continue to check up on things.,  Will do blood work today. Follow up plan: Return in about 6 months (around 02/09/2020), or if symptoms worsen or fail to improve, for Hypertension and GERD and cholesterol.  Counseling provided for all of the vaccine components Orders Placed This Encounter  Procedures  . CMP14+EGFR  . CBC with Differential/Platelet  . Lipid panel  . PSA, total and free    Caryl Pina, MD [facility] 08/09/2019, 2:34 PM

## 2019-08-09 NOTE — Addendum Note (Signed)
Addended by: Milas Hock on: 08/09/2019 04:48 PM   Modules accepted: Orders

## 2019-08-10 LAB — CBC WITH DIFFERENTIAL/PLATELET
Basophils Absolute: 0 10*3/uL (ref 0.0–0.2)
Basos: 1 %
EOS (ABSOLUTE): 0.1 10*3/uL (ref 0.0–0.4)
Eos: 2 %
Hematocrit: 42.9 % (ref 37.5–51.0)
Hemoglobin: 13.7 g/dL (ref 13.0–17.7)
Immature Grans (Abs): 0 10*3/uL (ref 0.0–0.1)
Immature Granulocytes: 0 %
Lymphocytes Absolute: 1.3 10*3/uL (ref 0.7–3.1)
Lymphs: 27 %
MCH: 27.6 pg (ref 26.6–33.0)
MCHC: 31.9 g/dL (ref 31.5–35.7)
MCV: 86 fL (ref 79–97)
Monocytes Absolute: 0.5 10*3/uL (ref 0.1–0.9)
Monocytes: 10 %
Neutrophils Absolute: 2.8 10*3/uL (ref 1.4–7.0)
Neutrophils: 60 %
Platelets: 166 10*3/uL (ref 150–450)
RBC: 4.97 x10E6/uL (ref 4.14–5.80)
RDW: 13.9 % (ref 11.6–15.4)
WBC: 4.7 10*3/uL (ref 3.4–10.8)

## 2019-08-10 LAB — CMP14+EGFR
ALT: 16 IU/L (ref 0–44)
AST: 24 IU/L (ref 0–40)
Albumin/Globulin Ratio: 1.8 (ref 1.2–2.2)
Albumin: 4.2 g/dL (ref 3.8–4.8)
Alkaline Phosphatase: 97 IU/L (ref 48–121)
BUN/Creatinine Ratio: 13 (ref 10–24)
BUN: 15 mg/dL (ref 8–27)
Bilirubin Total: 0.4 mg/dL (ref 0.0–1.2)
CO2: 29 mmol/L (ref 20–29)
Calcium: 9 mg/dL (ref 8.6–10.2)
Chloride: 100 mmol/L (ref 96–106)
Creatinine, Ser: 1.19 mg/dL (ref 0.76–1.27)
GFR calc Af Amer: 73 mL/min/{1.73_m2} (ref >59)
GFR calc non Af Amer: 63 mL/min/{1.73_m2} (ref >59)
Globulin, Total: 2.4 g/dL (ref 1.5–4.5)
Glucose: 84 mg/dL (ref 65–99)
Potassium: 4.2 mmol/L (ref 3.5–5.2)
Sodium: 140 mmol/L (ref 134–144)
Total Protein: 6.6 g/dL (ref 6.0–8.5)

## 2019-08-10 LAB — PSA, TOTAL AND FREE
PSA, Free Pct: 29.2 %
PSA, Free: 0.35 ng/mL
Prostate Specific Ag, Serum: 1.2 ng/mL (ref 0.0–4.0)

## 2019-08-10 LAB — LIPID PANEL
Chol/HDL Ratio: 3.3 ratio (ref 0.0–5.0)
Cholesterol, Total: 155 mg/dL (ref 100–199)
HDL: 47 mg/dL (ref >39)
LDL Chol Calc (NIH): 91 mg/dL (ref 0–99)
Triglycerides: 90 mg/dL (ref 0–149)
VLDL Cholesterol Cal: 17 mg/dL (ref 5–40)

## 2019-09-10 DIAGNOSIS — G894 Chronic pain syndrome: Secondary | ICD-10-CM | POA: Diagnosis not present

## 2019-09-10 DIAGNOSIS — Z978 Presence of other specified devices: Secondary | ICD-10-CM | POA: Diagnosis not present

## 2019-09-10 DIAGNOSIS — M961 Postlaminectomy syndrome, not elsewhere classified: Secondary | ICD-10-CM | POA: Diagnosis not present

## 2019-09-10 DIAGNOSIS — M5136 Other intervertebral disc degeneration, lumbar region: Secondary | ICD-10-CM | POA: Diagnosis not present

## 2019-10-15 DIAGNOSIS — G894 Chronic pain syndrome: Secondary | ICD-10-CM | POA: Diagnosis not present

## 2019-10-15 DIAGNOSIS — S301XXD Contusion of abdominal wall, subsequent encounter: Secondary | ICD-10-CM | POA: Diagnosis not present

## 2019-10-15 DIAGNOSIS — M961 Postlaminectomy syndrome, not elsewhere classified: Secondary | ICD-10-CM | POA: Diagnosis not present

## 2019-10-25 DIAGNOSIS — Z96652 Presence of left artificial knee joint: Secondary | ICD-10-CM | POA: Diagnosis not present

## 2019-10-25 DIAGNOSIS — G9763 Postprocedural seroma of a nervous system organ or structure following a nervous system procedure: Secondary | ICD-10-CM | POA: Diagnosis not present

## 2019-10-25 DIAGNOSIS — Z888 Allergy status to other drugs, medicaments and biological substances status: Secondary | ICD-10-CM | POA: Diagnosis not present

## 2019-10-25 DIAGNOSIS — G4730 Sleep apnea, unspecified: Secondary | ICD-10-CM | POA: Diagnosis not present

## 2019-10-25 DIAGNOSIS — I10 Essential (primary) hypertension: Secondary | ICD-10-CM | POA: Diagnosis not present

## 2019-10-25 DIAGNOSIS — E669 Obesity, unspecified: Secondary | ICD-10-CM | POA: Diagnosis not present

## 2019-10-25 DIAGNOSIS — Z79899 Other long term (current) drug therapy: Secondary | ICD-10-CM | POA: Diagnosis not present

## 2019-10-25 DIAGNOSIS — G4733 Obstructive sleep apnea (adult) (pediatric): Secondary | ICD-10-CM | POA: Diagnosis not present

## 2019-10-25 DIAGNOSIS — Z6833 Body mass index (BMI) 33.0-33.9, adult: Secondary | ICD-10-CM | POA: Diagnosis not present

## 2019-10-25 DIAGNOSIS — G894 Chronic pain syndrome: Secondary | ICD-10-CM | POA: Diagnosis not present

## 2019-10-25 DIAGNOSIS — L7634 Postprocedural seroma of skin and subcutaneous tissue following other procedure: Secondary | ICD-10-CM | POA: Diagnosis not present

## 2019-10-25 DIAGNOSIS — K219 Gastro-esophageal reflux disease without esophagitis: Secondary | ICD-10-CM | POA: Diagnosis not present

## 2019-10-30 ENCOUNTER — Other Ambulatory Visit: Payer: Medicare Other

## 2019-10-30 ENCOUNTER — Other Ambulatory Visit: Payer: Self-pay

## 2019-10-30 DIAGNOSIS — E291 Testicular hypofunction: Secondary | ICD-10-CM | POA: Diagnosis not present

## 2019-10-31 LAB — COMPREHENSIVE METABOLIC PANEL
ALT: 14 IU/L (ref 0–44)
AST: 13 IU/L (ref 0–40)
Albumin/Globulin Ratio: 1.5 (ref 1.2–2.2)
Albumin: 4 g/dL (ref 3.8–4.8)
Alkaline Phosphatase: 99 IU/L (ref 48–121)
BUN/Creatinine Ratio: 17 (ref 10–24)
BUN: 16 mg/dL (ref 8–27)
Bilirubin Total: 0.3 mg/dL (ref 0.0–1.2)
CO2: 26 mmol/L (ref 20–29)
Calcium: 9.2 mg/dL (ref 8.6–10.2)
Chloride: 99 mmol/L (ref 96–106)
Creatinine, Ser: 0.96 mg/dL (ref 0.76–1.27)
GFR calc Af Amer: 95 mL/min/{1.73_m2} (ref >59)
GFR calc non Af Amer: 82 mL/min/{1.73_m2} (ref >59)
Globulin, Total: 2.6 g/dL (ref 1.5–4.5)
Glucose: 118 mg/dL — ABNORMAL HIGH (ref 65–99)
Potassium: 4 mmol/L (ref 3.5–5.2)
Sodium: 141 mmol/L (ref 134–144)
Total Protein: 6.6 g/dL (ref 6.0–8.5)

## 2019-10-31 LAB — TESTOSTERONE: Testosterone: 529 ng/dL (ref 264–916)

## 2019-11-06 ENCOUNTER — Other Ambulatory Visit: Payer: Self-pay

## 2019-11-06 ENCOUNTER — Encounter: Payer: Self-pay | Admitting: "Endocrinology

## 2019-11-06 ENCOUNTER — Ambulatory Visit (INDEPENDENT_AMBULATORY_CARE_PROVIDER_SITE_OTHER): Payer: Medicare Other | Admitting: "Endocrinology

## 2019-11-06 VITALS — BP 138/70 | HR 80 | Ht 73.0 in | Wt 262.6 lb

## 2019-11-06 DIAGNOSIS — E291 Testicular hypofunction: Secondary | ICD-10-CM | POA: Diagnosis not present

## 2019-11-06 MED ORDER — TESTOSTERONE CYPIONATE 200 MG/ML IM SOLN: INTRAMUSCULAR | 1 refills | Status: DC

## 2019-11-06 NOTE — Progress Notes (Signed)
11/06/2019                     Endocrinology follow-up note   Subjective:    Patient ID: Jeff Stewart, male    DOB: August 21, 1955, PCP Dettinger, Jeff Kaufmann, MD   Past Medical History:  Diagnosis Date  . Anxiety   . Carpal tunnel syndrome, bilateral   . Cataract    removed years ago  . Chronic back pain    internal morphine pump  . DDD (degenerative disc disease)    neck, lumbar  . Depression   . Dyslipidemia    diet controlled  . GERD (gastroesophageal reflux disease)    past hx- had nissen fundiplication   . Hypertension    borderline  . Sleep apnea    wears C-PAP  . Status post insertion of spinal cord stimulator    Past Surgical History:  Procedure Laterality Date  . BACK SURGERY     x 6  . CARPAL TUNNEL RELEASE     bilateral  . COLONOSCOPY    . ELBOW SURGERY    . INGUINAL HERNIA REPAIR Right   . INTERNAL MORPHINE PUMP     . KNEE ARTHROSCOPY    . NASAL SEPTUM SURGERY     x 6  . neck fusion     . NISSEN FUNDOPLICATION    . SPINAL CORD STIMULATOR INSERTION    . STOMACH SURGERY     Nissen Fundiplication  . TOTAL KNEE ARTHROPLASTY Left 04/24/2018   Procedure: TOTAL KNEE ARTHROPLASTY;  Surgeon: Renette Butters, MD;  Location: [facility];  Service: Orthopedics;  Laterality: Left;  . UPPER GASTROINTESTINAL ENDOSCOPY     Social History   Socioeconomic History  . Marital status: Married    Spouse name: Not on file  . Number of children: 3  . Years of education: Not on file  . Highest education level: Not on file  Occupational History  . Occupation: [employer]: Retired  Tobacco Use  . Smoking status: Never Smoker  . Smokeless tobacco: Never Used  Vaping Use  . Vaping Use: Never used  Substance and Sexual Activity  . Alcohol use: No  . Drug use: No  . Sexual activity: Not Currently    Birth control/protection: Post-menopausal    Comment: married for 1972  Other Topics Concern  . Not on file  Social History Narrative   Lives with wife     Social Determinants of Health   Financial Resource Strain:   . Difficulty of Paying Living Expenses:   Food Insecurity:   . Worried About Charity fundraiser in the Last Year:   . Arboriculturist in the Last Year:   Transportation Needs:   . Film/video editor (Medical):   Marland Kitchen Lack of Transportation (Non-Medical):   Physical Activity:   . Days of Exercise per Week:   . Minutes of Exercise per Session:   Stress:   . Feeling of Stress :   Social Connections:   . Frequency of Communication with Friends and Family:   . Frequency of Social Gatherings with Friends and Family:   . Attends Religious Services:   . Active Member of Clubs or Organizations:   . Attends Archivist Meetings:   Marland Kitchen Marital Status:    Outpatient Encounter Medications as of 11/06/2019  Medication Sig  . acetaminophen (TYLENOL) 500 MG tablet Take 500 mg by mouth every 6 (six) hours as  needed.  . cetirizine (ZYRTEC) 10 MG tablet Take 1 tablet (10 mg total) by mouth daily.  . fluticasone (FLONASE) 50 MCG/ACT nasal spray Place 2 sprays into both nostrils daily.  . furosemide (LASIX) 40 MG tablet Take 1 tablet (40 mg total) by mouth every morning.  . hydrochlorothiazide (HYDRODIURIL) 25 MG tablet Take 1 tablet (25 mg total) by mouth daily.  Marland Kitchen losartan (COZAAR) 50 MG tablet Take 1 tablet (50 mg total) by mouth 2 (two) times daily. Take 50 mg 2 (two) times daily by mouth.  Marland Kitchen omeprazole (PRILOSEC) 20 MG capsule Take 1 capsule (20 mg total) by mouth 2 (two) times daily before a meal.  . sertraline (ZOLOFT) 100 MG tablet Take 1 tablet (100 mg total) by mouth daily. (Needs to be seen before next refill)  . Syringe/Needle, Disp, (SYRINGE 3CC/21GX1-1/4") 21G X 1-1/4" 3 ML MISC 1 each by Does not apply route once a week.  . testosterone cypionate (DEPOTESTOSTERONE CYPIONATE) 200 MG/ML injection INJECT 0.5 MLS INTRAMUSCULARY ONCE A WEEK  . Turmeric 500 MG CAPS Take by mouth.  . [DISCONTINUED] testosterone cypionate  (DEPOTESTOSTERONE CYPIONATE) 200 MG/ML injection INJECT 0.5 MLS INTRAMUSCULARY ONCE A WEEK   Facility-Administered Encounter Medications as of 11/06/2019  Medication  . 0.9 %  sodium chloride infusion   ALLERGIES: Allergies  Allergen Reactions  . Nalbuphine Nausea And Vomiting, Rash and Shortness Of Breath  . Nubain [Nalbuphine Hcl] Rash   VACCINATION STATUS: Immunization History  Administered Date(s) Administered  . Fluad Quad(high Dose 65+) 12/21/2018  . Influenza Split 01/19/2014  . Influenza,inj,Quad PF,6+ Mos 01/14/2015, 02/02/2016, 02/08/2018  . Influenza-Unspecified 02/19/2014  . Moderna SARS-COVID-2 Vaccination 05/12/2019, 05/23/2019  . Pneumococcal Conjugate-13 08/09/2019  . Tdap 12/11/2014    HPI Jeff Stewart is a 64 year old gentleman with a medical history as above.  He is being seen in follow-up for hypogonadism on testosterone replacement therapy.   -He is currently on testosterone 100 mg IM every 7 days.  He continues to feel better, no new complaints today.     -He reports better energy level, better mobility. He was known to have low testosterone for at least since age 64.  His previsit labs show total testosterone of 673 ng per DL.   He fathers 3 grown children. He denies history of head injury , has had nasal septal surgery multiple times. He denies injury to the testicles, exposure to chemotherapy, exposure to radiation to the genitals. However, due to chronic back injury and surgery he has exposure to heavy opioid therapy for pain control. He is currently on morphine pump. He has been off and  on  opioids for pain control for the last 10-15 years.   -His previsit labs show total testosterone of 529, normal CBC, CMP, PSA.  Review of Systems Limited as above.  Objective:    BP 138/70   Pulse 80   Ht $R'6\' 1"'vV$  (1.854 m)   Wt 262 lb 9.6 oz (119.1 kg)   BMI 34.65 kg/m   Wt Readings from Last 3 Encounters:  11/06/19 262 lb 9.6 oz (119.1 kg)  08/09/19 253 lb 6  oz (114.9 kg)  07/04/19 256 lb 3.2 oz (116.2 kg)      Physical Exam- Limited  Constitutional:  Body mass index is 34.65 kg/m. , not in acute distress, normal state of mind Eyes:  EOMI, no exophthalmos Neck: Supple Thyroid: No gross goiter Respiratory: Adequate breathing efforts Musculoskeletal: no gross deformities, strength intact in all four extremities, no gross restriction  of joint movements Skin:  no rashes, no hyperemia Neurological: no tremor with outstretched hands  Genitourinary system: He has bilaterally shrunk testicles, right testes 12 mL, left testes 15 mL. No scrotal mass, no varicocele,  no inguinal lymphadenopathy.    Recent Results (from the past 2160 hour(s))  CMP14+EGFR     Status: None   Collection Time: 08/09/19  2:36 PM  Result Value Ref Range   Glucose 84 65 - 99 mg/dL   BUN 15 8 - 27 mg/dL   Creatinine, Ser 1.19 0.76 - 1.27 mg/dL   GFR calc non Af Amer 63 >59 mL/min/1.73   GFR calc Af Amer 73 >59 mL/min/1.73    Comment: **[facility] currently reports eGFR in compliance with the current**   recommendations of the [employer]. [facility] will   update reporting as new guidelines are published from the NKF-ASN   Task force.    BUN/Creatinine Ratio 13 10 - 24   Sodium 140 134 - 144 mmol/L   Potassium 4.2 3.5 - 5.2 mmol/L   Chloride 100 96 - 106 mmol/L   CO2 29 20 - 29 mmol/L   Calcium 9.0 8.6 - 10.2 mg/dL   Total Protein 6.6 6.0 - 8.5 g/dL   Albumin 4.2 3.8 - 4.8 g/dL   Globulin, Total 2.4 1.5 - 4.5 g/dL   Albumin/Globulin Ratio 1.8 1.2 - 2.2   Bilirubin Total 0.4 0.0 - 1.2 mg/dL   Alkaline Phosphatase 97 48 - 121 IU/L    Comment:               **Please note reference interval change**   AST 24 0 - 40 IU/L   ALT 16 0 - 44 IU/L  CBC with Differential/Platelet     Status: None   Collection Time: 08/09/19  2:36 PM  Result Value Ref Range   WBC 4.7 3.4 - 10.8 x10E3/uL   RBC 4.97 4.14 - 5.80 x10E6/uL   Hemoglobin 13.7 13.0 - 17.7 g/dL    Hematocrit 42.9 37.5 - 51.0 %   MCV 86 79 - 97 fL   MCH 27.6 26.6 - 33.0 pg   MCHC 31.9 31 - 35 g/dL   RDW 13.9 11.6 - 15.4 %   Platelets 166 150 - 450 x10E3/uL   Neutrophils 60 Not Estab. %   Lymphs 27 Not Estab. %   Monocytes 10 Not Estab. %   Eos 2 Not Estab. %   Basos 1 Not Estab. %   Neutrophils Absolute 2.8 1 - 7 x10E3/uL   Lymphocytes Absolute 1.3 0 - 3 x10E3/uL   Monocytes Absolute 0.5 0 - 0 x10E3/uL   EOS (ABSOLUTE) 0.1 0.0 - 0.4 x10E3/uL   Basophils Absolute 0.0 0 - 0 x10E3/uL   Immature Granulocytes 0 Not Estab. %   Immature Grans (Abs) 0.0 0.0 - 0.1 x10E3/uL  Lipid panel     Status: None   Collection Time: 08/09/19  2:36 PM  Result Value Ref Range   Cholesterol, Total 155 100 - 199 mg/dL   Triglycerides 90 0 - 149 mg/dL   HDL 47 >39 mg/dL   VLDL Cholesterol Cal 17 5 - 40 mg/dL   LDL Chol Calc (NIH) 91 0 - 99 mg/dL   Chol/HDL Ratio 3.3 0.0 - 5.0 ratio    Comment:  T. Chol/HDL Ratio                                             Men  Women                               1/2 Avg.Risk  3.4    3.3                                   Avg.Risk  5.0    4.4                                2X Avg.Risk  9.6    7.1                                3X Avg.Risk 23.4   11.0   PSA, total and free     Status: None   Collection Time: 08/09/19  2:36 PM  Result Value Ref Range   Prostate Specific Ag, Serum 1.2 0.0 - 4.0 ng/mL    Comment: Roche ECLIA methodology. According to the American Urological Association, Serum PSA should decrease and remain at undetectable levels after radical prostatectomy. The AUA defines biochemical recurrence as an initial PSA value 0.2 ng/mL or greater followed by a subsequent confirmatory PSA value 0.2 ng/mL or greater. Values obtained with different assay methods or kits cannot be used interchangeably. Results cannot be interpreted as absolute evidence of the presence or absence of malignant disease.    PSA, Free 0.35 N/A  ng/mL    Comment: Roche ECLIA methodology.   PSA, Free Pct 29.2 %    Comment: The table below lists the probability of prostate cancer for men with non-suspicious DRE results and total PSA between 4 and 10 ng/mL, by patient age Ricci Barker, [place], [id number]).                   % Free PSA       50-64 yr        65-75 yr                   0.00-10.00%        56%             55%                  10.01-15.00%        24%             35%                  15.01-20.00%        17%             23%                  20.01-25.00%        10%             20%                       >25.00%  5%              9% Please note:  Catalona et al did not make specific               recommendations regarding the use of               percent free PSA for any other population               of men.   Testosterone     Status: None   Collection Time: 10/30/19  8:36 AM  Result Value Ref Range   Testosterone 529 264 - 916 ng/dL    Comment: Adult male reference interval is based on a population of healthy nonobese males (BMI <30) between 16 and 53 years old. [place], [place] [phone]. PMID: [id number].   Comprehensive metabolic panel     Status: Abnormal   Collection Time: 10/30/19  8:36 AM  Result Value Ref Range   Glucose 118 (H) 65 - 99 mg/dL   BUN 16 8 - 27 mg/dL   Creatinine, Ser 0.96 0.76 - 1.27 mg/dL   GFR calc non Af Amer 82 >59 mL/min/1.73   GFR calc Af Amer 95 >59 mL/min/1.73    Comment: **[facility] currently reports eGFR in compliance with the current**   recommendations of the [employer]. [facility] will   update reporting as new guidelines are published from the NKF-ASN   Task force.    BUN/Creatinine Ratio 17 10 - 24   Sodium 141 134 - 144 mmol/L   Potassium 4.0 3.5 - 5.2 mmol/L   Chloride 99 96 - 106 mmol/L   CO2 26 20 - 29 mmol/L   Calcium 9.2 8.6 - 10.2 mg/dL   Total Protein 6.6 6.0 - 8.5 g/dL   Albumin 4.0 3.8 - 4.8 g/dL   Globulin, Total 2.6 1.5 -  4.5 g/dL   Albumin/Globulin Ratio 1.5 1.2 - 2.2   Bilirubin Total 0.3 0.0 - 1.2 mg/dL   Alkaline Phosphatase 99 48 - 121 IU/L   AST 13 0 - 40 IU/L   ALT 14 0 - 44 IU/L     Assessment & Plan:   1. Hypogonadism  -Testosterone replacement helped improve his testosterone from 74 ->286 -> 782 -> 509 > 978 > 659-> 437 >494> 327-> 506-> 640 -> 673 -> 529   No adverse events noted. 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   -It is possible that it is multifactorial including exposure to heavy opioids, multiple back surgeries, prior inguinal surgery, or idiopathic.  -Prolactin level is favorable at 6.1, he will not need pituitary/sella imaging for now.  -He has benefited from testosterone replacement therapy, and he wishes to continue treatment.    -He is advised to continue testosterone 100 mg IM weekly  for target testosterone level of 400-600 mcg/dL.   -He we will return in 57-months with total and free testosterone measurements.  -His previsit labs show normal PSA, CBC, CMP.    - I advised patient to maintain close follow up with Dettinger, Jeff Kaufmann, MD for primary care needs.      - Time spent on this patient care encounter:  20 minutes of which 50% was spent in  counseling and the rest reviewing  his current and  previous labs / studies and medications  doses and developing a plan for long term care. Basilio Cairo  participated  in the discussions, expressed understanding, and voiced agreement with the above plans.  All questions were answered to his satisfaction. he is encouraged to contact clinic should he have any questions or concerns prior to his return visit.    Follow up plan: Return in about 4 months (around 03/07/2020) for F/U with Pre-visit Labs.  Glade Lloyd, MD Phone: [phone]  Fax: [phone]   This note was partially dictated with voice recognition software. Similar sounding words  can be transcribed inadequately or may not  be corrected upon review.  11/06/2019, 11:09 AM

## 2019-11-07 DIAGNOSIS — G894 Chronic pain syndrome: Secondary | ICD-10-CM | POA: Diagnosis not present

## 2019-11-07 DIAGNOSIS — M961 Postlaminectomy syndrome, not elsewhere classified: Secondary | ICD-10-CM | POA: Diagnosis not present

## 2019-11-07 DIAGNOSIS — Z978 Presence of other specified devices: Secondary | ICD-10-CM | POA: Diagnosis not present

## 2019-11-07 DIAGNOSIS — M5030 Other cervical disc degeneration, unspecified cervical region: Secondary | ICD-10-CM | POA: Diagnosis not present

## 2019-11-26 ENCOUNTER — Other Ambulatory Visit: Payer: Self-pay | Admitting: Family Medicine

## 2019-12-16 ENCOUNTER — Other Ambulatory Visit: Payer: Self-pay | Admitting: Family Medicine

## 2020-01-02 DIAGNOSIS — M5030 Other cervical disc degeneration, unspecified cervical region: Secondary | ICD-10-CM | POA: Diagnosis not present

## 2020-01-02 DIAGNOSIS — M961 Postlaminectomy syndrome, not elsewhere classified: Secondary | ICD-10-CM | POA: Diagnosis not present

## 2020-01-02 DIAGNOSIS — Z79899 Other long term (current) drug therapy: Secondary | ICD-10-CM | POA: Diagnosis not present

## 2020-01-02 DIAGNOSIS — G894 Chronic pain syndrome: Secondary | ICD-10-CM | POA: Diagnosis not present

## 2020-01-02 DIAGNOSIS — Z5181 Encounter for therapeutic drug level monitoring: Secondary | ICD-10-CM | POA: Diagnosis not present

## 2020-01-02 DIAGNOSIS — Z978 Presence of other specified devices: Secondary | ICD-10-CM | POA: Diagnosis not present

## 2020-01-13 ENCOUNTER — Other Ambulatory Visit: Payer: Self-pay | Admitting: Family Medicine

## 2020-02-24 DIAGNOSIS — Z23 Encounter for immunization: Secondary | ICD-10-CM | POA: Diagnosis not present

## 2020-02-25 ENCOUNTER — Other Ambulatory Visit: Payer: Self-pay | Admitting: Family Medicine

## 2020-02-28 DIAGNOSIS — H16223 Keratoconjunctivitis sicca, not specified as Sjogren's, bilateral: Secondary | ICD-10-CM | POA: Diagnosis not present

## 2020-02-28 DIAGNOSIS — H2511 Age-related nuclear cataract, right eye: Secondary | ICD-10-CM | POA: Diagnosis not present

## 2020-02-28 DIAGNOSIS — Z961 Presence of intraocular lens: Secondary | ICD-10-CM | POA: Diagnosis not present

## 2020-02-28 DIAGNOSIS — H524 Presbyopia: Secondary | ICD-10-CM | POA: Diagnosis not present

## 2020-02-28 DIAGNOSIS — H52221 Regular astigmatism, right eye: Secondary | ICD-10-CM | POA: Diagnosis not present

## 2020-02-28 DIAGNOSIS — H5201 Hypermetropia, right eye: Secondary | ICD-10-CM | POA: Diagnosis not present

## 2020-03-03 ENCOUNTER — Other Ambulatory Visit: Payer: Medicare Other

## 2020-03-03 ENCOUNTER — Other Ambulatory Visit: Payer: Self-pay

## 2020-03-03 DIAGNOSIS — E291 Testicular hypofunction: Secondary | ICD-10-CM | POA: Diagnosis not present

## 2020-03-04 DIAGNOSIS — Z978 Presence of other specified devices: Secondary | ICD-10-CM | POA: Diagnosis not present

## 2020-03-04 DIAGNOSIS — M542 Cervicalgia: Secondary | ICD-10-CM | POA: Diagnosis not present

## 2020-03-04 DIAGNOSIS — G894 Chronic pain syndrome: Secondary | ICD-10-CM | POA: Diagnosis not present

## 2020-03-04 DIAGNOSIS — M961 Postlaminectomy syndrome, not elsewhere classified: Secondary | ICD-10-CM | POA: Diagnosis not present

## 2020-03-04 DIAGNOSIS — M5136 Other intervertebral disc degeneration, lumbar region: Secondary | ICD-10-CM | POA: Diagnosis not present

## 2020-03-04 DIAGNOSIS — M5030 Other cervical disc degeneration, unspecified cervical region: Secondary | ICD-10-CM | POA: Diagnosis not present

## 2020-03-04 LAB — CBC WITH DIFFERENTIAL/PLATELET
Basophils Absolute: 0 10*3/uL (ref 0.0–0.2)
Basos: 1 %
EOS (ABSOLUTE): 0.1 10*3/uL (ref 0.0–0.4)
Eos: 2 %
Hematocrit: 45.1 % (ref 37.5–51.0)
Hemoglobin: 14.6 g/dL (ref 13.0–17.7)
Immature Grans (Abs): 0 10*3/uL (ref 0.0–0.1)
Immature Granulocytes: 0 %
Lymphocytes Absolute: 1.4 10*3/uL (ref 0.7–3.1)
Lymphs: 26 %
MCH: 27.4 pg (ref 26.6–33.0)
MCHC: 32.4 g/dL (ref 31.5–35.7)
MCV: 85 fL (ref 79–97)
Monocytes Absolute: 0.6 10*3/uL (ref 0.1–0.9)
Monocytes: 12 %
Neutrophils Absolute: 3.2 10*3/uL (ref 1.4–7.0)
Neutrophils: 59 %
Platelets: 189 10*3/uL (ref 150–450)
RBC: 5.33 x10E6/uL (ref 4.14–5.80)
RDW: 13.7 % (ref 11.6–15.4)
WBC: 5.3 10*3/uL (ref 3.4–10.8)

## 2020-03-04 LAB — TESTOSTERONE, FREE, TOTAL, SHBG
Sex Hormone Binding: 20.7 nmol/L (ref 19.3–76.4)
Testosterone, Free: 11 pg/mL (ref 6.6–18.1)
Testosterone: 446 ng/dL (ref 264–916)

## 2020-03-10 ENCOUNTER — Telehealth: Payer: Medicare Other | Admitting: "Endocrinology

## 2020-03-10 DIAGNOSIS — Z23 Encounter for immunization: Secondary | ICD-10-CM | POA: Diagnosis not present

## 2020-03-11 ENCOUNTER — Encounter: Payer: Self-pay | Admitting: "Endocrinology

## 2020-03-11 ENCOUNTER — Other Ambulatory Visit: Payer: Self-pay

## 2020-03-11 ENCOUNTER — Telehealth (INDEPENDENT_AMBULATORY_CARE_PROVIDER_SITE_OTHER): Payer: Medicare Other | Admitting: "Endocrinology

## 2020-03-11 VITALS — Ht 73.0 in | Wt 258.0 lb

## 2020-03-11 DIAGNOSIS — E291 Testicular hypofunction: Secondary | ICD-10-CM

## 2020-03-11 MED ORDER — TESTOSTERONE CYPIONATE 200 MG/ML IM SOLN: INTRAMUSCULAR | 0 refills | Status: DC

## 2020-03-11 NOTE — Progress Notes (Signed)
03/11/2020                                                  Endocrinology Telehealth Visit Follow up Note -During COVID -19 Pandemic  This visit type was conducted  via telephone due to national recommendations for restrictions regarding the COVID-19 Pandemic  in an effort to limit this patient's exposure and mitigate transmission of the corona virus.   I connected with Jeff Stewart on 03/11/2020   by telephone and verified that I am speaking with the correct person using two identifiers. Jeff Stewart, October 30, 1955. he has verbally consented to this visit.  I was in my office and patient was in his residence. No other persons were with me during the encounter. All issues noted in this document were discussed and addressed. The format was not optimal for physical exam.    Subjective:    Patient ID: Jeff Stewart, male    DOB: 1955/08/04, PCP Dettinger, Fransisca Kaufmann, MD   Past Medical History:  Diagnosis Date  . Anxiety   . Carpal tunnel syndrome, bilateral   . Cataract    removed years ago  . Chronic back pain    internal morphine pump  . DDD (degenerative disc disease)    neck, lumbar  . Depression   . Dyslipidemia    diet controlled  . GERD (gastroesophageal reflux disease)    past hx- had nissen fundiplication   . Hypertension    borderline  . Sleep apnea    wears C-PAP  . Status post insertion of spinal cord stimulator    Past Surgical History:  Procedure Laterality Date  . BACK SURGERY     x 6  . CARPAL TUNNEL RELEASE     bilateral  . COLONOSCOPY    . ELBOW SURGERY    . INGUINAL HERNIA REPAIR Right   . INTERNAL MORPHINE PUMP     . KNEE ARTHROSCOPY    . NASAL SEPTUM SURGERY     x 6  . neck fusion     . NISSEN FUNDOPLICATION    . SPINAL CORD STIMULATOR INSERTION    . STOMACH SURGERY     Nissen Fundiplication  . TOTAL KNEE ARTHROPLASTY Left 04/24/2018   Procedure: TOTAL KNEE ARTHROPLASTY;  Surgeon: Jeff Butters, MD;  Location: [facility];  Service:  Orthopedics;  Laterality: Left;  . UPPER GASTROINTESTINAL ENDOSCOPY     Social History   Socioeconomic History  . Marital status: Married    Spouse name: Not on file  . Number of children: 3  . Years of education: Not on file  . Highest education level: Not on file  Occupational History  . Occupation: [employer] [place]: Retired  Tobacco Use  . Smoking status: Never Smoker  . Smokeless tobacco: Never Used  Vaping Use  . Vaping Use: Never used  Substance and Sexual Activity  . Alcohol use: No  . Drug use: No  . Sexual activity: Not Currently    Birth control/protection: Post-menopausal    Comment: married for 1972  Other Topics Concern  . Not on file  Social History Narrative   Lives with wife    Social Determinants of Health   Financial Resource Strain: Not on file  Food Insecurity: Not on file  Transportation Needs: Not on file  Physical  Activity: Not on file  Stress: Not on file  Social Connections: Not on file   Outpatient Encounter Medications as of 03/11/2020  Medication Sig  . acetaminophen (TYLENOL) 500 MG tablet Take 500 mg by mouth every 6 (six) hours as needed.  . cetirizine (ZYRTEC) 10 MG tablet Take 1 tablet (10 mg total) by mouth daily.  . fluticasone (FLONASE) 50 MCG/ACT nasal spray Place 2 sprays into both nostrils daily.  . furosemide (LASIX) 40 MG tablet Take 1 tablet (40 mg total) by mouth every morning. (Needs to be seen before next refill)  . hydrochlorothiazide (HYDRODIURIL) 25 MG tablet Take 1 tablet (25 mg total) by mouth daily. (Needs to be seen before next refill)  . losartan (COZAAR) 50 MG tablet TAKE 1 TABLET 2 TIMES A DAY  . omeprazole (PRILOSEC) 20 MG capsule Take 1 capsule (20 mg total) by mouth 2 (two) times daily before a meal.  . sertraline (ZOLOFT) 100 MG tablet Take 1 tablet (100 mg total) by mouth daily. (Needs to be seen before next refill)  . Syringe/Needle, Disp, (SYRINGE 3CC/21GX1-1/4") 21G X 1-1/4" 3 ML MISC 1 each by  Does not apply route once a week.  . Turmeric 500 MG CAPS Take by mouth.  . [DISCONTINUED] testosterone cypionate (DEPOTESTOSTERONE CYPIONATE) 200 MG/ML injection INJECT 0.5 MLS INTRAMUSCULARY ONCE A WEEK  . [DISCONTINUED] testosterone cypionate (DEPOTESTOSTERONE CYPIONATE) 200 MG/ML injection INJECT 0.5 MLS INTRAMUSCULARY ONCE A WEEK   Facility-Administered Encounter Medications as of 03/11/2020  Medication  . 0.9 %  sodium chloride infusion   ALLERGIES: Allergies  Allergen Reactions  . Nalbuphine Nausea And Vomiting, Rash and Shortness Of Breath  . Nubain [Nalbuphine Hcl] Rash   VACCINATION STATUS: Immunization History  Administered Date(s) Administered  . Fluad Quad(high Dose 65+) 12/21/2018  . Influenza Split 01/19/2014  . Influenza,inj,Quad PF,6+ Mos 01/14/2015, 02/02/2016, 02/08/2018  . Influenza-Unspecified 02/19/2014, 02/24/2020  . Moderna Sars-Covid-2 Vaccination 05/12/2019, 05/23/2019  . Pneumococcal Conjugate-13 08/09/2019  . Tdap 12/11/2014    HPI Jeff Stewart is a 64 year old gentleman with a medical history as above.  He is being seen in follow-up for hypogonadism on testosterone replacement therapy.   -He is currently on testosterone 100 mg IM every 7 days.   He continues to feel better, no new complaints today.     -He reports better energy level, better mobility. He was known to have low testosterone for at least since age 64.  His previsit labs show total testosterone of 446-remaining stable.   He fathers 3 grown children. He denies history of head injury , has had nasal septal surgery multiple times. He denies injury to the testicles, exposure to chemotherapy, exposure to radiation to the genitals. However, due to chronic back injury and surgery he has exposure to heavy opioid therapy for pain control. He is currently on morphine pump. He has been off and  on  opioids for pain control for the last 10-15 years.   -His previsit labs show total testosterone of 529,  normal CBC, CMP, PSA.  Review of Systems Limited as above.  Objective:    Ht 6\' 1"  (1.854 m)   Wt 258 lb (117 kg)   BMI 34.04 kg/m   Wt Readings from Last 3 Encounters:  03/11/20 258 lb (117 kg)  11/06/19 262 lb 9.6 oz (119.1 kg)  08/09/19 253 lb 6 oz (114.9 kg)      From previous exam.  Genitourinary system: He has bilaterally shrunk testicles, right testes 12 mL, left testes  15 mL. No scrotal mass, no varicocele,  no inguinal lymphadenopathy.    Recent Results (from the past 2160 hour(s))  Testosterone, Free, Total, SHBG     Status: None   Collection Time: 03/03/20  9:26 AM  Result Value Ref Range   Testosterone 446 264 - 916 ng/dL    Comment: Adult male reference interval is based on a population of healthy nonobese males (BMI <30) between 57 and 41 years old. [place], [place] [phone]. PMID: [id number].    Testosterone, Free 11.0 6.6 - 18.1 pg/mL   Sex Hormone Binding 20.7 19.3 - 76.4 nmol/L  CBC with Differential/Platelet     Status: None   Collection Time: 03/03/20  9:26 AM  Result Value Ref Range   WBC 5.3 3.4 - 10.8 x10E3/uL   RBC 5.33 4.14 - 5.80 x10E6/uL   Hemoglobin 14.6 13.0 - 17.7 g/dL   Hematocrit 45.1 37.5 - 51.0 %   MCV 85 79 - 97 fL   MCH 27.4 26.6 - 33.0 pg   MCHC 32.4 31.5 - 35.7 g/dL   RDW 13.7 11.6 - 15.4 %   Platelets 189 150 - 450 x10E3/uL   Neutrophils 59 Not Estab. %   Lymphs 26 Not Estab. %   Monocytes 12 Not Estab. %   Eos 2 Not Estab. %   Basos 1 Not Estab. %   Neutrophils Absolute 3.2 1.4 - 7.0 x10E3/uL   Lymphocytes Absolute 1.4 0.7 - 3.1 x10E3/uL   Monocytes Absolute 0.6 0.1 - 0.9 x10E3/uL   EOS (ABSOLUTE) 0.1 0.0 - 0.4 x10E3/uL   Basophils Absolute 0.0 0.0 - 0.2 x10E3/uL   Immature Granulocytes 0 Not Estab. %   Immature Grans (Abs) 0.0 0.0 - 0.1 x10E3/uL     Assessment & Plan:   1. Hypogonadism  -Testosterone replacement helped improve his testosterone from 74 ->286 -> 782 -> 509 > 978 > 659-> 437 >494>  327-> 506-> 640 -> 673 -> 529 -> 446   No adverse events noted. 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   -It is possible that it is multifactorial including exposure to heavy opioids, multiple back surgeries, prior inguinal surgery, or idiopathic.  -Prolactin level is favorable at 6.1, he will not need pituitary/sella imaging for now.  -He has benefited from testosterone replacement therapy, and he wishes to continue treatment.    -He is advised to continue testosterone 100 mg IM weekly   for target testosterone level of 400-600 mcg/dL.   -He we will return in 26-months with total and free testosterone measurements.  -His previsit labs show normal PSA, CBC, CMP.    - I advised patient to maintain close follow up with Dettinger, Fransisca Kaufmann, MD for primary care needs.     - Time spent on this patient care encounter:  20 minutes of which 50% was spent in  counseling and the rest reviewing  his current and  previous labs / studies and medications  doses and developing a plan for long term care. Levonne Spiller Bolds  participated in the discussions, expressed understanding, and voiced agreement with the above plans.  All questions were answered to his satisfaction. he is encouraged to contact clinic should he have any questions or concerns prior to his return visit.   Follow up plan: Return in about 4 months (around 07/10/2020) for Fasting Labs  in AM B4 8.  Glade Lloyd, MD Phone: [phone]  Fax: [phone]  This note was partially dictated with voice recognition software. Similar sounding words can be transcribed inadequately or may not  be corrected upon review.  03/11/2020, 12:03 PM

## 2020-03-18 ENCOUNTER — Other Ambulatory Visit: Payer: Self-pay | Admitting: Family Medicine

## 2020-03-30 ENCOUNTER — Other Ambulatory Visit: Payer: Self-pay | Admitting: Family Medicine

## 2020-04-06 ENCOUNTER — Ambulatory Visit (INDEPENDENT_AMBULATORY_CARE_PROVIDER_SITE_OTHER): Payer: Medicare Other | Admitting: Family Medicine

## 2020-04-06 ENCOUNTER — Encounter: Payer: Self-pay | Admitting: Family Medicine

## 2020-04-06 DIAGNOSIS — F419 Anxiety disorder, unspecified: Secondary | ICD-10-CM

## 2020-04-06 DIAGNOSIS — R0981 Nasal congestion: Secondary | ICD-10-CM

## 2020-04-06 DIAGNOSIS — K219 Gastro-esophageal reflux disease without esophagitis: Secondary | ICD-10-CM

## 2020-04-06 DIAGNOSIS — I10 Essential (primary) hypertension: Secondary | ICD-10-CM

## 2020-04-06 DIAGNOSIS — F32A Depression, unspecified: Secondary | ICD-10-CM | POA: Diagnosis not present

## 2020-04-06 MED ORDER — FUROSEMIDE 40 MG PO TABS: 40.0000 mg | ORAL_TABLET | Freq: Every morning | ORAL | 3 refills | Status: DC

## 2020-04-06 MED ORDER — SERTRALINE HCL 100 MG PO TABS: 100.0000 mg | ORAL_TABLET | Freq: Every day | ORAL | 3 refills | Status: DC

## 2020-04-06 MED ORDER — OMEPRAZOLE 20 MG PO CPDR: 20.0000 mg | DELAYED_RELEASE_CAPSULE | Freq: Two times a day (BID) | ORAL | 3 refills | Status: DC

## 2020-04-06 MED ORDER — FLUTICASONE PROPIONATE 50 MCG/ACT NA SUSP: 2.0000 | Freq: Every day | NASAL | 11 refills | Status: AC

## 2020-04-06 MED ORDER — CETIRIZINE HCL 10 MG PO TABS
10.0000 mg | ORAL_TABLET | Freq: Every day | ORAL | 3 refills | Status: DC
Start: 2020-04-06 — End: 2021-04-12

## 2020-04-06 MED ORDER — LOSARTAN POTASSIUM 50 MG PO TABS: 50.0000 mg | ORAL_TABLET | Freq: Two times a day (BID) | ORAL | 3 refills | Status: DC

## 2020-04-06 MED ORDER — HYDROCHLOROTHIAZIDE 25 MG PO TABS
25.0000 mg | ORAL_TABLET | Freq: Every day | ORAL | 3 refills | Status: DC
Start: 2020-04-06 — End: 2021-04-12

## 2020-04-06 NOTE — Progress Notes (Signed)
Virtual Visit via telephone Note  I connected with Jeff Stewart on 04/06/20 at 1425 by telephone and verified that I am speaking with the correct person using two identifiers. Jeff Stewart is currently located at home and wife are currently with her during visit. The provider, Fransisca Kaufmann Lasandra Batley, MD is located in their office at time of visit.  Call ended at 1436  I discussed the limitations, risks, security and privacy concerns of performing an evaluation and management service by telephone and the availability of in person appointments. I also discussed with the patient that there may be a patient responsible charge related to this service. The patient expressed understanding and agreed to proceed.   History and Present Illness: Hypertension Patient is currently on losartan and hctz, and their blood pressure today is unknown. Patient denies any lightheadedness or dizziness. Patient denies headaches, blurred vision, chest pains, shortness of breath, or weakness. Denies any side effects from medication and is content with current medication.   GERD Patient is currently on omeprazole.  She denies any major symptoms or abdominal pain or belching or burping. She denies any blood in her stool or lightheadedness or dizziness.   Anxiety recheck Patient is currently on zoloft. He has some anxiety but is not overwhelming. He has a lot of covid anxiety. He is taking 200 mg and it is doing well.   No diagnosis found.  Outpatient Encounter Medications as of 04/06/2020  Medication Sig  . acetaminophen (TYLENOL) 500 MG tablet Take 500 mg by mouth every 6 (six) hours as needed.  . cetirizine (ZYRTEC) 10 MG tablet Take 1 tablet (10 mg total) by mouth daily.  . fluticasone (FLONASE) 50 MCG/ACT nasal spray Place 2 sprays into both nostrils daily.  . furosemide (LASIX) 40 MG tablet TAKE 1 TABLET EVERY MORNING  . hydrochlorothiazide (HYDRODIURIL) 25 MG tablet TAKE 1 TABLET EVERY DAY  . losartan  (COZAAR) 50 MG tablet TAKE 1 TABLET 2 TIMES A DAY  . omeprazole (PRILOSEC) 20 MG capsule Take 1 capsule (20 mg total) by mouth 2 (two) times daily before a meal.  . sertraline (ZOLOFT) 100 MG tablet TAKE 1 TABLET DAILY  . Syringe/Needle, Disp, (SYRINGE 3CC/21GX1-1/4") 21G X 1-1/4" 3 ML MISC 1 each by Does not apply route once a week.  . testosterone cypionate (DEPOTESTOSTERONE CYPIONATE) 200 MG/ML injection INJECT 0.5 MLS INTRAMUSCULARY ONCE A WEEK  . Turmeric 500 MG CAPS Take by mouth.   Facility-Administered Encounter Medications as of 04/06/2020  Medication  . 0.9 %  sodium chloride infusion    Review of Systems  Constitutional: Negative for chills and fever.  Eyes: Negative for visual disturbance.  Respiratory: Negative for shortness of breath and wheezing.   Cardiovascular: Negative for chest pain and leg swelling.  Musculoskeletal: Negative for back pain and gait problem.  Skin: Negative for rash.  Neurological: Negative for dizziness, weakness and light-headedness.  All other systems reviewed and are negative.   Observations/Objective: Patient sounds comfortable and in no acute distress  Assessment and Plan: Problem List Items Addressed This Visit      Cardiovascular and Mediastinum   Essential hypertension, benign - Primary   Relevant Medications   losartan (COZAAR) 50 MG tablet   hydrochlorothiazide (HYDRODIURIL) 25 MG tablet   furosemide (LASIX) 40 MG tablet     Digestive   GERD (gastroesophageal reflux disease)   Relevant Medications   omeprazole (PRILOSEC) 20 MG capsule     Other   Anxiety and depression  Relevant Medications   sertraline (ZOLOFT) 100 MG tablet    Other Visit Diagnoses    Sinus congestion       Relevant Medications   fluticasone (FLONASE) 50 MCG/ACT nasal spray   cetirizine (ZYRTEC) 10 MG tablet      Patient sounds like he is doing okay, having some anxiety but is not ready to change yet but just wanted to mention it, he will discuss  in the future if he does want to change or increase or adjust his medications.  He just had blood work with his endocrinology less than a month ago and everything looked relatively good, we will follow-up in 6 months for blood work Follow up plan: Return in about 6 months (around 10/04/2020), or if symptoms worsen or fail to improve, for Hypertension GERD and anxiety recheck.     I discussed the assessment and treatment plan with the patient. The patient was provided an opportunity to ask questions and all were answered. The patient agreed with the plan and demonstrated an understanding of the instructions.   The patient was advised to call back or seek an in-person evaluation if the symptoms worsen or if the condition fails to improve as anticipated.  The above assessment and management plan was discussed with the patient. The patient verbalized understanding of and has agreed to the management plan. Patient is aware to call the clinic if symptoms persist or worsen. Patient is aware when to return to the clinic for a follow-up visit. Patient educated on when it is appropriate to go to the emergency department.    I provided 11 minutes of non-face-to-face time during this encounter.    Worthy Rancher, MD

## 2020-04-23 ENCOUNTER — Other Ambulatory Visit: Payer: Self-pay

## 2020-04-23 ENCOUNTER — Ambulatory Visit (INDEPENDENT_AMBULATORY_CARE_PROVIDER_SITE_OTHER): Payer: Medicare Other | Admitting: *Deleted

## 2020-04-23 DIAGNOSIS — Z23 Encounter for immunization: Secondary | ICD-10-CM

## 2020-04-23 NOTE — Progress Notes (Signed)
Patient in today for Pneumonia and Shingles vaccines. Tolerated well.

## 2020-04-30 DIAGNOSIS — M961 Postlaminectomy syndrome, not elsewhere classified: Secondary | ICD-10-CM | POA: Diagnosis not present

## 2020-04-30 DIAGNOSIS — Z978 Presence of other specified devices: Secondary | ICD-10-CM | POA: Diagnosis not present

## 2020-04-30 DIAGNOSIS — M542 Cervicalgia: Secondary | ICD-10-CM | POA: Diagnosis not present

## 2020-04-30 DIAGNOSIS — M5030 Other cervical disc degeneration, unspecified cervical region: Secondary | ICD-10-CM | POA: Diagnosis not present

## 2020-04-30 DIAGNOSIS — G894 Chronic pain syndrome: Secondary | ICD-10-CM | POA: Diagnosis not present

## 2020-04-30 DIAGNOSIS — M5136 Other intervertebral disc degeneration, lumbar region: Secondary | ICD-10-CM | POA: Diagnosis not present

## 2020-06-25 DIAGNOSIS — M5136 Other intervertebral disc degeneration, lumbar region: Secondary | ICD-10-CM | POA: Diagnosis not present

## 2020-06-25 DIAGNOSIS — M5030 Other cervical disc degeneration, unspecified cervical region: Secondary | ICD-10-CM | POA: Diagnosis not present

## 2020-06-25 DIAGNOSIS — M4606 Spinal enthesopathy, lumbar region: Secondary | ICD-10-CM | POA: Diagnosis not present

## 2020-06-25 DIAGNOSIS — Z79899 Other long term (current) drug therapy: Secondary | ICD-10-CM | POA: Diagnosis not present

## 2020-06-25 DIAGNOSIS — M961 Postlaminectomy syndrome, not elsewhere classified: Secondary | ICD-10-CM | POA: Diagnosis not present

## 2020-06-25 DIAGNOSIS — Z5181 Encounter for therapeutic drug level monitoring: Secondary | ICD-10-CM | POA: Diagnosis not present

## 2020-06-25 DIAGNOSIS — Z981 Arthrodesis status: Secondary | ICD-10-CM | POA: Diagnosis not present

## 2020-06-25 DIAGNOSIS — G894 Chronic pain syndrome: Secondary | ICD-10-CM | POA: Diagnosis not present

## 2020-06-25 DIAGNOSIS — M4316 Spondylolisthesis, lumbar region: Secondary | ICD-10-CM | POA: Diagnosis not present

## 2020-06-25 DIAGNOSIS — M47816 Spondylosis without myelopathy or radiculopathy, lumbar region: Secondary | ICD-10-CM | POA: Diagnosis not present

## 2020-07-06 ENCOUNTER — Other Ambulatory Visit: Payer: Medicare Other

## 2020-07-06 ENCOUNTER — Other Ambulatory Visit: Payer: Self-pay

## 2020-07-07 ENCOUNTER — Ambulatory Visit (INDEPENDENT_AMBULATORY_CARE_PROVIDER_SITE_OTHER): Payer: Medicare Other

## 2020-07-07 DIAGNOSIS — Z0000 Encounter for general adult medical examination without abnormal findings: Secondary | ICD-10-CM | POA: Diagnosis not present

## 2020-07-07 NOTE — Progress Notes (Signed)
MEDICARE ANNUAL WELLNESS VISIT  07/07/2020  Telephone Visit Disclaimer This Medicare AWV was conducted by telephone due to national recommendations for restrictions regarding the COVID-19 Pandemic (e.g. social distancing).  I verified, using two identifiers, that I am speaking with Jeff Stewart or their authorized healthcare agent. I discussed the limitations, risks, security, and privacy concerns of performing an evaluation and management service by telephone and the potential availability of an in-person appointment in the future. The patient expressed understanding and agreed to proceed.  Location of Patient: Home Location of Provider (nurse):  [facility]  Subjective:    Jeff Stewart is a 65 y.o. male patient of Dettinger, Fransisca Kaufmann, MD who had a Medicare Annual Wellness Visit today via telephone. Jeff Stewart is Retired and lives with their spouse. he has three children. he reports that he is socially active and does interact with friends/family regularly. he is minimally physically active and enjoys woodworking.  Patient Care Team: Dettinger, Fransisca Kaufmann, MD as PCP - General (Family Medicine)  Advanced Directives 07/07/2020 05/09/2018 04/24/2018 04/24/2018 04/17/2018 11/22/2016 10/04/2015  Does Patient Have a Medical Advance Directive? No No No No No No No  Would patient like information on creating a medical advance directive? No - Patient declined - No - Patient declined No - Patient declined No - Patient declined - -    Hospital Utilization Over the Past 12 Months: # of hospitalizations or ER visits: 0 # of surgeries: 0  Review of Systems    Patient reports that his overall health is unchanged compared to last year.  Patient Reported Readings (BP, Pulse, CBG, Weight, etc) none  Pain Assessment Pain : 0-10 Pain Score: 7  Pain Type: Chronic pain Pain Location: Back Pain Orientation: Lower Pain Descriptors / Indicators: Constant Pain Onset: More than a  month ago Pain Frequency: Constant Pain Relieving Factors: Morphine pump and IBU/some help  Pain Relieving Factors: Morphine pump and IBU/some help  Current Medications & Allergies (verified) Allergies as of 07/07/2020      Reactions   Nalbuphine Nausea And Vomiting, Rash, Shortness Of Breath   Nubain [nalbuphine Hcl] Rash      Medication List       Accurate as of July 07, 2020  2:38 PM. If you have any questions, ask your nurse or doctor.        acetaminophen 500 MG tablet Commonly known as: TYLENOL Take 500 mg by mouth every 6 (six) hours as needed.   cetirizine 10 MG tablet Commonly known as: ZYRTEC Take 1 tablet (10 mg total) by mouth daily.   fluticasone 50 MCG/ACT nasal spray Commonly known as: FLONASE Place 2 sprays into both nostrils daily.   furosemide 40 MG tablet Commonly known as: LASIX Take 1 tablet (40 mg total) by mouth every morning.   hydrochlorothiazide 25 MG tablet Commonly known as: HYDRODIURIL Take 1 tablet (25 mg total) by mouth daily.   losartan 50 MG tablet Commonly known as: COZAAR Take 1 tablet (50 mg total) by mouth 2 (two) times daily.   omeprazole 20 MG capsule Commonly known as: PRILOSEC Take 1 capsule (20 mg total) by mouth 2 (two) times daily before a meal.   sertraline 100 MG tablet Commonly known as: ZOLOFT Take 1 tablet (100 mg total) by mouth daily.   SYRINGE 3CC/21GX1-1/4" 21G X 1-1/4" 3 ML Misc 1 each by Does not apply route once a week.   testosterone cypionate 200 MG/ML injection Commonly known as: DEPOTESTOSTERONE  CYPIONATE INJECT 0.5 MLS INTRAMUSCULARY ONCE A WEEK   Turmeric 500 MG Caps Take by mouth.       History (reviewed): Past Medical History:  Diagnosis Date  . Anxiety   . Carpal tunnel syndrome, bilateral   . Cataract    removed years ago  . Chronic back pain    internal morphine pump  . DDD (degenerative disc disease)    neck, lumbar  . Depression   . Dyslipidemia    diet controlled  .  GERD (gastroesophageal reflux disease)    past hx- had nissen fundiplication   . Hypertension    borderline  . Sleep apnea    wears C-PAP  . Status post insertion of spinal cord stimulator    Past Surgical History:  Procedure Laterality Date  . BACK SURGERY     x 6  . CARPAL TUNNEL RELEASE     bilateral  . COLONOSCOPY    . ELBOW SURGERY    . INGUINAL HERNIA REPAIR Right   . INTERNAL MORPHINE PUMP     . KNEE ARTHROSCOPY    . NASAL SEPTUM SURGERY     x 6  . neck fusion     . NISSEN FUNDOPLICATION    . SPINAL CORD STIMULATOR INSERTION    . STOMACH SURGERY     Nissen Fundiplication  . TOTAL KNEE ARTHROPLASTY Left 04/24/2018   Procedure: TOTAL KNEE ARTHROPLASTY;  Surgeon: Renette Butters, MD;  Location: [facility];  Service: Orthopedics;  Laterality: Left;  . UPPER GASTROINTESTINAL ENDOSCOPY     Family History  Problem Relation Age of Onset  . CAD Father 28  . Emphysema Father   . Stroke Mother 65  . CAD Brother 87       CABG  . Alcohol abuse Brother   . Hypertension Brother   . Stroke Brother   . CAD Brother   . Cirrhosis Brother   . Alcohol abuse Brother   . Hypertension Brother   . CAD Sister   . Hypertension Sister   . Hyperlipidemia Sister   . Cancer Sister        melanoma  . Cancer Sister   . Lung cancer Sister   . Cancer Brother   . Lung cancer Brother   . Cancer Brother   . Lung cancer Brother   . Colon cancer Neg Hx   . Colon polyps Neg Hx   . Esophageal cancer Neg Hx   . Rectal cancer Neg Hx   . Stomach cancer Neg Hx    Social History   Socioeconomic History  . Marital status: Married    Spouse name: Not on file  . Number of children: 3  . Years of education: Not on file  . Highest education level: Not on file  Occupational History  . Occupation: [employer]ating and [employer]: Retired  Tobacco Use  . Smoking status: Never Smoker  . Smokeless tobacco: Never Used  Vaping Use  . Vaping Use: Never used  Substance and Sexual Activity  . Alcohol  use: No  . Drug use: No  . Sexual activity: Not Currently    Birth control/protection: Post-menopausal    Comment: married for 1972  Other Topics Concern  . Not on file  Social History Narrative   Lives with wife    Social Determinants of Health   Financial Resource Strain: Not on file  Food Insecurity: Not on file  Transportation Needs: Not on file  Physical Activity: Not  on file  Stress: Not on file  Social Connections: Not on file    Activities of Daily Living In your present state of health, do you have any difficulty performing the following activities: 07/07/2020  Hearing? N  Vision? N  Difficulty concentrating or making decisions? N  Walking or climbing stairs? N  Dressing or bathing? N  Doing errands, shopping? N  Preparing Food and eating ? N  Using the Toilet? N  In the past six months, have you accidently leaked urine? Y  Do you have problems with loss of bowel control? N  Managing your Medications? N  Managing your Finances? N  Housekeeping or managing your Housekeeping? N  Some recent data might be hidden    Patient Education/ Literacy How often do you need to have someone help you when you read instructions, pamphlets, or other written materials from your doctor or pharmacy?: 1 - Never What is the last grade level you completed in school?: GED  Exercise Current Exercise Habits: The patient does not participate in regular exercise at present, Exercise limited by: orthopedic condition(s)  Diet Patient reports consuming 3 meals a day and 1 snack(s) a day Patient reports that his primary diet is: Regular Patient reports that she does have regular access to food.   Depression Screen PHQ 2/9 Scores 07/07/2020 08/09/2019 03/29/2018 02/07/2018 11/24/2017 09/07/2017 08/28/2017  PHQ - 2 Score 3 0 1 1 0 0 0  PHQ- 9 Score - 6 - - - - -     Fall Risk Fall Risk  07/07/2020 08/09/2019 06/01/2018 03/29/2018 02/07/2018  Falls in the past year? 0 0 0 0 0     Objective:   NECHEMIA CHIAPPETTA seemed alert and oriented and he participated appropriately during our telephone visit.  Blood Pressure Weight BMI  BP Readings from Last 3 Encounters:  11/06/19 138/70  08/09/19 132/78  07/04/19 (!) 158/89   Wt Readings from Last 3 Encounters:  03/11/20 258 lb (117 kg)  11/06/19 262 lb 9.6 oz (119.1 kg)  08/09/19 253 lb 6 oz (114.9 kg)   BMI Readings from Last 1 Encounters:  03/11/20 34.04 kg/m    *Unable to obtain current vital signs, weight, and BMI due to telephone visit type  Hearing/Vision  . Wilton did not seem to have difficulty with hearing/understanding during the telephone conversation . Reports that he has not had a formal eye exam by an eye care professional within the past year . Reports that he has not had a formal hearing evaluation within the past year *Unable to fully assess hearing and vision during telephone visit type  Cognitive Function: 6CIT Screen 07/07/2020  What Year? 0 points  What month? 0 points  What time? 0 points  Count back from 20 0 points  Months in reverse 0 points  Repeat phrase 0 points  Total Score 0   (Normal:0-7, Significant for Dysfunction: >8)  Normal Cognitive Function Screening: Yes   Immunization & Health Maintenance Record Immunization History  Administered Date(s) Administered  . Fluad Quad(high Dose 65+) 12/21/2018  . Influenza Split 01/19/2014  . Influenza,inj,Quad PF,6+ Mos 01/14/2015, 02/02/2016, 02/08/2018  . Influenza-Unspecified 02/19/2014, 02/24/2020  . Moderna Sars-Covid-2 Vaccination 05/12/2019, 05/23/2019, 03/10/2020  . Pneumococcal Conjugate-13 08/09/2019  . Pneumococcal Polysaccharide-23 04/23/2020  . Tdap 12/11/2014  . Zoster Recombinat (Shingrix) 04/23/2020    Health Maintenance  Topic Date Due  . INFLUENZA VACCINE  10/19/2020  . TETANUS/TDAP  12/10/2024  . COLONOSCOPY (Pts 45-69yrs Insurance coverage will need to  be confirmed)  01/07/2029  . COVID-19 Vaccine  Completed  .  Hepatitis C Screening  Completed  . PNA vac Low Risk Adult  Completed  . HPV VACCINES  Aged Out       Assessment  This is a routine wellness examination for CHE RACHAL.  Health Maintenance: Due or Overdue There are no preventive care reminders to display for this patient.  Jeff Stewart does not need a referral for Community Assistance: Care Management:   no Social Work:    no Prescription Assistance:  no Nutrition/Diabetes Education:  no   Plan:  Personalized Goals Goals Addressed            This Visit's Progress   . Increase physical activity        Personalized Health Maintenance & Screening Recommendations   Lung Cancer Screening Recommended: no (Low Dose CT Chest recommended if Age 18-80 years, 30 pack-year currently smoking OR have quit w/in past 15 years) Hepatitis C Screening recommended: no HIV Screening recommended: no  Advanced Directives: Written information was not prepared per patient's request.  Referrals & Orders No orders of the defined types were placed in this encounter.   Follow-up Plan . Follow-up with Dettinger, Fransisca Kaufmann, MD as planned . Schedule 07/30/20 abd 10/08/20    I have personally reviewed and noted the following in the patient's chart:   . Medical and social history . Use of alcohol, tobacco or illicit drugs  . Current medications and supplements . Functional ability and status . Nutritional status . Physical activity . Advanced directives . List of other physicians . Hospitalizations, surgeries, and ER visits in previous 12 months . Vitals . Screenings to include cognitive, depression, and falls . Referrals and appointments  In addition, I have reviewed and discussed with Jeff Stewart certain preventive protocols, quality metrics, and best practice recommendations. A written personalized care plan for preventive services as well as general preventive health recommendations is available and can be mailed to the  patient at his request.      Maud Deed [facility]  10/21/2334

## 2020-07-07 NOTE — Patient Instructions (Signed)
  [place] Maintenance Summary and Written Plan of Care  Mr. Stirewalt ,  Thank you for allowing me to perform your Medicare Annual Wellness Visit and for your ongoing commitment to your health.   Health Maintenance & Immunization History Health Maintenance  Topic Date Due  . INFLUENZA VACCINE  10/19/2020  . TETANUS/TDAP  12/10/2024  . COLONOSCOPY (Pts 45-67yrs Insurance coverage will need to be confirmed)  01/07/2029  . COVID-19 Vaccine  Completed  . Hepatitis C Screening  Completed  . PNA vac Low Risk Adult  Completed  . HPV VACCINES  Aged Out   Immunization History  Administered Date(s) Administered  . Fluad Quad(high Dose 65+) 12/21/2018  . Influenza Split 01/19/2014  . Influenza,inj,Quad PF,6+ Mos 01/14/2015, 02/02/2016, 02/08/2018  . Influenza-Unspecified 02/19/2014, 02/24/2020  . Moderna Sars-Covid-2 Vaccination 05/12/2019, 05/23/2019, 03/10/2020  . Pneumococcal Conjugate-13 08/09/2019  . Pneumococcal Polysaccharide-23 04/23/2020  . Tdap 12/11/2014  . Zoster Recombinat (Shingrix) 04/23/2020    These are the patient goals that we discussed: Goals Addressed            This Visit's Progress   . Increase physical activity          This is a list of Health Maintenance Items that are overdue or due now: There are no preventive care reminders to display for this patient.   Orders/Referrals Placed Today: No orders of the defined types were placed in this encounter.  (Contact our referral department at [phone] if you have not spoken with someone about your referral appointment within the next 5 days)    Follow-up Plan  Scheduled May 12th at 11:10 to discuss pain in thumb and discuss elbow  Scheduled for routine follow up 10/08/20 at 9:10. Both appointments are scheduled with Dr. Warrick Parisian.

## 2020-07-08 LAB — COMPREHENSIVE METABOLIC PANEL
ALT: 14 IU/L (ref 0–44)
AST: 21 IU/L (ref 0–40)
Albumin/Globulin Ratio: 1.9 (ref 1.2–2.2)
Albumin: 4.5 g/dL (ref 3.8–4.8)
Alkaline Phosphatase: 91 IU/L (ref 44–121)
BUN/Creatinine Ratio: 17 (ref 10–24)
BUN: 19 mg/dL (ref 8–27)
Bilirubin Total: 0.5 mg/dL (ref 0.0–1.2)
CO2: 29 mmol/L (ref 20–29)
Calcium: 9.4 mg/dL (ref 8.6–10.2)
Chloride: 99 mmol/L (ref 96–106)
Creatinine, Ser: 1.13 mg/dL (ref 0.76–1.27)
Globulin, Total: 2.4 g/dL (ref 1.5–4.5)
Glucose: 93 mg/dL (ref 65–99)
Potassium: 4.8 mmol/L (ref 3.5–5.2)
Sodium: 139 mmol/L (ref 134–144)
Total Protein: 6.9 g/dL (ref 6.0–8.5)
eGFR: 71 mL/min/{1.73_m2} (ref >59)

## 2020-07-08 LAB — TESTOSTERONE, FREE, TOTAL, SHBG
Sex Hormone Binding: 22.3 nmol/L (ref 19.3–76.4)
Testosterone, Free: 7.6 pg/mL (ref 6.6–18.1)
Testosterone: 340 ng/dL (ref 264–916)

## 2020-07-10 ENCOUNTER — Encounter: Payer: Self-pay | Admitting: "Endocrinology

## 2020-07-10 ENCOUNTER — Ambulatory Visit: Payer: Medicare Other | Admitting: "Endocrinology

## 2020-07-10 ENCOUNTER — Other Ambulatory Visit: Payer: Self-pay

## 2020-07-10 VITALS — BP 112/68 | HR 68 | Ht 73.0 in | Wt 262.0 lb

## 2020-07-10 DIAGNOSIS — E291 Testicular hypofunction: Secondary | ICD-10-CM

## 2020-07-10 MED ORDER — TESTOSTERONE CYPIONATE 200 MG/ML IM SOLN: INTRAMUSCULAR | 0 refills | Status: DC

## 2020-07-10 MED ORDER — "SYRINGE 21G X 1-1/4"" 3 ML MISC": 1.0000 | 0 refills | Status: AC

## 2020-07-10 NOTE — Progress Notes (Signed)
07/10/2020                       Endocrinology follow-up note    Subjective:    Patient ID: Jeff Stewart, male    DOB: December 04, 1955, PCP Dettinger, Fransisca Kaufmann, MD   Past Medical History:  Diagnosis Date  . Anxiety   . Carpal tunnel syndrome, bilateral   . Cataract    removed years ago  . Chronic back pain    internal morphine pump  . DDD (degenerative disc disease)    neck, lumbar  . Depression   . Dyslipidemia    diet controlled  . GERD (gastroesophageal reflux disease)    past hx- had nissen fundiplication   . Hypertension    borderline  . Sleep apnea    wears C-PAP  . Status post insertion of spinal cord stimulator    Past Surgical History:  Procedure Laterality Date  . BACK SURGERY     x 6  . CARPAL TUNNEL RELEASE     bilateral  . COLONOSCOPY    . ELBOW SURGERY    . INGUINAL HERNIA REPAIR Right   . INTERNAL MORPHINE PUMP     . KNEE ARTHROSCOPY    . NASAL SEPTUM SURGERY     x 6  . neck fusion     . NISSEN FUNDOPLICATION    . SPINAL CORD STIMULATOR INSERTION    . STOMACH SURGERY     Nissen Fundiplication  . TOTAL KNEE ARTHROPLASTY Left 04/24/2018   Procedure: TOTAL KNEE ARTHROPLASTY;  Surgeon: Renette Butters, MD;  Location: [facility];  Service: Orthopedics;  Laterality: Left;  . UPPER GASTROINTESTINAL ENDOSCOPY     Social History   Socioeconomic History  . Marital status: Married    Spouse name: Not on file  . Number of children: 3  . Years of education: Not on file  . Highest education level: Not on file  Occupational History  . Occupation: [employer]ating and [employer]: Retired  Tobacco Use  . Smoking status: Never Smoker  . Smokeless tobacco: Never Used  Vaping Use  . Vaping Use: Never used  Substance and Sexual Activity  . Alcohol use: No  . Drug use: No  . Sexual activity: Not Currently    Birth control/protection: Post-menopausal    Comment: married for 1972  Other Topics Concern  . Not on file  Social History Narrative   Lives with  wife    Social Determinants of Health   Financial Resource Strain: Not on file  Food Insecurity: Not on file  Transportation Needs: Not on file  Physical Activity: Not on file  Stress: Not on file  Social Connections: Not on file   Outpatient Encounter Medications as of 07/10/2020  Medication Sig  . acetaminophen (TYLENOL) 500 MG tablet Take 500 mg by mouth every 6 (six) hours as needed.  . cetirizine (ZYRTEC) 10 MG tablet Take 1 tablet (10 mg total) by mouth daily.  . fluticasone (FLONASE) 50 MCG/ACT nasal spray Place 2 sprays into both nostrils daily.  . furosemide (LASIX) 40 MG tablet Take 1 tablet (40 mg total) by mouth every morning.  . hydrochlorothiazide (HYDRODIURIL) 25 MG tablet Take 1 tablet (25 mg total) by mouth daily.  Marland Kitchen losartan (COZAAR) 50 MG tablet Take 1 tablet (50 mg total) by mouth 2 (two) times daily.  Marland Kitchen omeprazole (PRILOSEC) 20 MG capsule Take 1 capsule (20 mg total) by mouth 2 (two) times daily  before a meal.  . sertraline (ZOLOFT) 100 MG tablet Take 1 tablet (100 mg total) by mouth daily.  . Syringe/Needle, Disp, (SYRINGE 3CC/21GX1-1/4") 21G X 1-1/4" 3 ML MISC 1 each by Does not apply route once a week.  . testosterone cypionate (DEPOTESTOSTERONE CYPIONATE) 200 MG/ML injection INJECT 0.5 MLS INTRAMUSCULARY ONCE A WEEK  . Turmeric 500 MG CAPS Take by mouth.  . [DISCONTINUED] Syringe/Needle, Disp, (SYRINGE 3CC/21GX1-1/4") 21G X 1-1/4" 3 ML MISC 1 each by Does not apply route once a week.  . [DISCONTINUED] testosterone cypionate (DEPOTESTOSTERONE CYPIONATE) 200 MG/ML injection INJECT 0.5 MLS INTRAMUSCULARY ONCE A WEEK   Facility-Administered Encounter Medications as of 07/10/2020  Medication  . 0.9 %  sodium chloride infusion   ALLERGIES: Allergies  Allergen Reactions  . Nalbuphine Nausea And Vomiting, Rash and Shortness Of Breath  . Nubain [Nalbuphine Hcl] Rash   VACCINATION STATUS: Immunization History  Administered Date(s) Administered  . Fluad Quad(high  Dose 65+) 12/21/2018  . Influenza Split 01/19/2014  . Influenza,inj,Quad PF,6+ Mos 01/14/2015, 02/02/2016, 02/08/2018  . Influenza-Unspecified 02/19/2014, 02/24/2020  . Moderna Sars-Covid-2 Vaccination 05/12/2019, 05/23/2019, 03/10/2020  . Pneumococcal Conjugate-13 08/09/2019  . Pneumococcal Polysaccharide-23 04/23/2020  . Tdap 12/11/2014  . Zoster Recombinat (Shingrix) 04/23/2020    HPI Jeff Stewart is a 65 year old gentleman with a medical history as above.  He is being seen in follow-up for hypogonadism on testosterone replacement therapy.   -He is currently on testosterone 100 mg IM every 7 days.   He continues to feel better, no new complaints today.     -He reports better energy level, better mobility. He was known to have low testosterone for at least since age 45.  His previsit labs show total testosterone of 340-remaining stable.  No evidence of adverse effects from testosterone treatment.  He wishes to be continued on testosterone placement therapy. He fathers 3 grown children. He denies history of head injury , has had nasal septal surgery multiple times. He denies injury to the testicles, exposure to chemotherapy, exposure to radiation to the genitals. However, due to chronic back injury and surgery he has exposure to heavy opioid therapy for pain control. He is currently on morphine pump. He has been off and  on  opioids for pain control for the last 10-15 years.    Review of Systems Limited as above.  Objective:    BP 112/68   Pulse 68   Ht $R'6\' 1"'Yh$  (1.854 m)   Wt 262 lb (118.8 kg)   BMI 34.57 kg/m   Wt Readings from Last 3 Encounters:  07/10/20 262 lb (118.8 kg)  03/11/20 258 lb (117 kg)  11/06/19 262 lb 9.6 oz (119.1 kg)      From previous exam.  Genitourinary system: He has bilaterally shrunk testicles, right testes 12 mL, left testes 15 mL. No scrotal mass, no varicocele,  no inguinal lymphadenopathy.    Recent Results (from the past 2160 hour(s))   Comprehensive metabolic panel     Status: None   Collection Time: 07/06/20  8:24 AM  Result Value Ref Range   Glucose 93 65 - 99 mg/dL   BUN 19 8 - 27 mg/dL   Creatinine, Ser 1.13 0.76 - 1.27 mg/dL   eGFR 71 >59 mL/min/1.73   BUN/Creatinine Ratio 17 10 - 24   Sodium 139 134 - 144 mmol/L   Potassium 4.8 3.5 - 5.2 mmol/L   Chloride 99 96 - 106 mmol/L   CO2 29 20 - 29 mmol/L  Calcium 9.4 8.6 - 10.2 mg/dL   Total Protein 6.9 6.0 - 8.5 g/dL   Albumin 4.5 3.8 - 4.8 g/dL   Globulin, Total 2.4 1.5 - 4.5 g/dL   Albumin/Globulin Ratio 1.9 1.2 - 2.2   Bilirubin Total 0.5 0.0 - 1.2 mg/dL   Alkaline Phosphatase 91 44 - 121 IU/L   AST 21 0 - 40 IU/L   ALT 14 0 - 44 IU/L  Testosterone, Free, Total, SHBG     Status: None   Collection Time: 07/06/20  8:24 AM  Result Value Ref Range   Testosterone 340 264 - 916 ng/dL    Comment: Adult male reference interval is based on a population of healthy nonobese males (BMI <30) between 48 and 33 years old. [place], [place] [phone]. PMID: [id number].    Testosterone, Free 7.6 6.6 - 18.1 pg/mL   Sex Hormone Binding 22.3 19.3 - 76.4 nmol/L     Assessment & Plan:   1. Hypogonadism  -Testosterone replacement helped improve his testosterone from 74 ->286 -> 782 -> 509 > 978 > 659-> 437 >494> 327-> 506-> 640 -> 673 -> 529 -> 446 -> 340   No adverse events noted. 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   -It is possible that it is multifactorial including exposure to heavy opioids, multiple back surgeries, prior inguinal surgery, or idiopathic.  -Prolactin level is favorable at 6.1, he will not need pituitary/sella imaging for now.  -He has benefited from testosterone replacement therapy, and he wishes to continue treatment.    -He is advised to continue testosterone 100 mg IM weekly for target testosterone level 300-600 mcg/dL.   -He we will return in  80-months with total and free testosterone measurements.  -His previsit labs show normal PSA, CBC, CMP.    - I advised patient to maintain close follow up with Dettinger, Fransisca Kaufmann, MD for primary care needs.   I spent 25 minutes in the care of the patient today including review of labs from Thyroid Function, CMP, and other relevant labs ; imaging/biopsy records (current and previous including abstractions from other facilities); face-to-face time discussing  his lab results and symptoms, medications doses, his options of short and long term treatment based on the latest standards of care / guidelines;   and documenting the encounter.  Jeff Stewart  participated in the discussions, expressed understanding, and voiced agreement with the above plans.  All questions were answered to his satisfaction. he is encouraged to contact clinic should he have any questions or concerns prior to his return visit.   Follow up plan: Return in about 4 months (around 11/09/2020) for Fasting Labs  in AM B4 8.  Glade Lloyd, MD Phone: [phone]  Fax: [phone]   This note was partially dictated with voice recognition software. Similar sounding words can be transcribed inadequately or may not  be corrected upon review.  07/10/2020, 11:02 AM

## 2020-07-25 DIAGNOSIS — R52 Pain, unspecified: Secondary | ICD-10-CM | POA: Diagnosis not present

## 2020-07-25 DIAGNOSIS — U071 COVID-19: Secondary | ICD-10-CM | POA: Diagnosis not present

## 2020-07-25 DIAGNOSIS — R0989 Other specified symptoms and signs involving the circulatory and respiratory systems: Secondary | ICD-10-CM | POA: Diagnosis not present

## 2020-07-30 ENCOUNTER — Ambulatory Visit: Payer: Medicare Other | Admitting: Family Medicine

## 2020-08-27 DIAGNOSIS — Z978 Presence of other specified devices: Secondary | ICD-10-CM | POA: Diagnosis not present

## 2020-08-27 DIAGNOSIS — M961 Postlaminectomy syndrome, not elsewhere classified: Secondary | ICD-10-CM | POA: Diagnosis not present

## 2020-08-27 DIAGNOSIS — G894 Chronic pain syndrome: Secondary | ICD-10-CM | POA: Diagnosis not present

## 2020-08-27 DIAGNOSIS — M5136 Other intervertebral disc degeneration, lumbar region: Secondary | ICD-10-CM | POA: Diagnosis not present

## 2020-08-27 DIAGNOSIS — M5030 Other cervical disc degeneration, unspecified cervical region: Secondary | ICD-10-CM | POA: Diagnosis not present

## 2020-09-08 ENCOUNTER — Encounter: Payer: Self-pay | Admitting: Family Medicine

## 2020-09-08 ENCOUNTER — Other Ambulatory Visit: Payer: Self-pay

## 2020-09-08 ENCOUNTER — Ambulatory Visit (INDEPENDENT_AMBULATORY_CARE_PROVIDER_SITE_OTHER): Payer: Medicare Other | Admitting: Family Medicine

## 2020-09-08 VITALS — BP 137/76 | HR 67 | Ht 73.0 in | Wt 259.0 lb

## 2020-09-08 DIAGNOSIS — M1811 Unilateral primary osteoarthritis of first carpometacarpal joint, right hand: Secondary | ICD-10-CM

## 2020-09-08 NOTE — Progress Notes (Signed)
BP 137/76   Pulse 67   Ht 6\' 1"  (1.854 m)   Wt 259 lb (117.5 kg)   SpO2 99%   BMI 34.17 kg/m    Subjective:   Patient ID: Jeff Stewart, male    DOB: 1955/05/29, 65 y.o.   MRN: [id number]  HPI: Jeff Stewart is a 65 y.o. male presenting on 09/08/2020 for thumb pain (Right. Started about 11m ago. Denies injury.)   HPI Right thumb pain Patient is coming in complaining of right thumb pain that is been bothering him over the past 6 months but has gradually been worsening.  He says that he gets swelling in there and it hurts mainly in the lower joint but then sometimes hurts all the way around the thumb when the swelling gets really bad.  He has been taking Tylenol and using CBD oil on there and they do help some but not much.  He says it has been worsening over the past month.  Relevant past medical, surgical, family and social history reviewed and updated as indicated. Interim medical history since our last visit reviewed. Allergies and medications reviewed and updated.  Review of Systems  Constitutional:  Negative for chills and fever.  Respiratory:  Negative for shortness of breath and wheezing.   Cardiovascular:  Negative for chest pain and leg swelling.  Musculoskeletal:  Positive for arthralgias and joint swelling.  Skin:  Negative for rash.  All other systems reviewed and are negative.  Per HPI unless specifically indicated above   Allergies as of 09/08/2020       Reactions   Nalbuphine Nausea And Vomiting, Rash, Shortness Of Breath   Nubain [nalbuphine Hcl] Rash        Medication List        Accurate as of September 08, 2020 11:22 AM. If you have any questions, ask your nurse or doctor.          acetaminophen 500 MG tablet Commonly known as: TYLENOL Take 500 mg by mouth every 6 (six) hours as needed.   cetirizine 10 MG tablet Commonly known as: ZYRTEC Take 1 tablet (10 mg total) by mouth daily.   fluticasone 50 MCG/ACT nasal spray Commonly known as:  FLONASE Place 2 sprays into both nostrils daily.   furosemide 40 MG tablet Commonly known as: LASIX Take 1 tablet (40 mg total) by mouth every morning.   hydrochlorothiazide 25 MG tablet Commonly known as: HYDRODIURIL Take 1 tablet (25 mg total) by mouth daily.   losartan 50 MG tablet Commonly known as: COZAAR Take 1 tablet (50 mg total) by mouth 2 (two) times daily.   omeprazole 20 MG capsule Commonly known as: PRILOSEC Take 1 capsule (20 mg total) by mouth 2 (two) times daily before a meal.   sertraline 100 MG tablet Commonly known as: ZOLOFT Take 1 tablet (100 mg total) by mouth daily.   SYRINGE 3CC/21GX1-1/4" 21G X 1-1/4" 3 ML Misc 1 each by Does not apply route once a week.   testosterone cypionate 200 MG/ML injection Commonly known as: DEPOTESTOSTERONE CYPIONATE INJECT 0.5 MLS INTRAMUSCULARY ONCE A WEEK   Turmeric 500 MG Caps Take by mouth.         Objective:   BP 137/76   Pulse 67   Ht 6\' 1"  (1.854 m)   Wt 259 lb (117.5 kg)   SpO2 99%   BMI 34.17 kg/m   Wt Readings from Last 3 Encounters:  09/08/20 259 lb (117.5 kg)  07/10/20 262  lb (118.8 kg)  03/11/20 258 lb (117 kg)    Physical Exam Vitals and nursing note reviewed.  Constitutional:      General: He is not in acute distress.    Appearance: He is well-developed. He is not diaphoretic.  Eyes:     General: No scleral icterus. Neck:     Thyroid: No thyromegaly.  Musculoskeletal:        General: Normal range of motion.     Right hand: Swelling and tenderness (Overlying carpal medical carpal joint of the thumb) present. No deformity. Normal range of motion. Normal strength. Normal sensation. There is no disruption of two-point discrimination. Normal capillary refill.  Skin:    General: Skin is warm and dry.     Findings: No rash.  Neurological:     Mental Status: He is alert and oriented to person, place, and time.      Assessment & Plan:   Problem List Items Addressed This Visit    None Visit Diagnoses     Arthritis of carpometacarpal (CMC) joint of right thumb    -  Primary   Relevant Orders   Ambulatory referral to Orthopedic Surgery       Recommended Voltaren gel and referred to hand specialist. Follow up plan: Return if symptoms worsen or fail to improve.  Counseling provided for all of the vaccine components Orders Placed This Encounter  Procedures   Ambulatory referral to [place], MD [facility] 09/08/2020, 11:22 AM

## 2020-10-08 ENCOUNTER — Encounter: Payer: Self-pay | Admitting: Family Medicine

## 2020-10-08 ENCOUNTER — Other Ambulatory Visit: Payer: Self-pay

## 2020-10-08 ENCOUNTER — Ambulatory Visit (INDEPENDENT_AMBULATORY_CARE_PROVIDER_SITE_OTHER): Payer: Medicare Other | Admitting: Family Medicine

## 2020-10-08 VITALS — BP 132/79 | HR 74 | Ht 73.0 in | Wt 258.0 lb

## 2020-10-08 DIAGNOSIS — F32A Depression, unspecified: Secondary | ICD-10-CM

## 2020-10-08 DIAGNOSIS — G4733 Obstructive sleep apnea (adult) (pediatric): Secondary | ICD-10-CM | POA: Diagnosis not present

## 2020-10-08 DIAGNOSIS — K219 Gastro-esophageal reflux disease without esophagitis: Secondary | ICD-10-CM | POA: Diagnosis not present

## 2020-10-08 DIAGNOSIS — R5383 Other fatigue: Secondary | ICD-10-CM | POA: Diagnosis not present

## 2020-10-08 DIAGNOSIS — Z23 Encounter for immunization: Secondary | ICD-10-CM | POA: Diagnosis not present

## 2020-10-08 DIAGNOSIS — F419 Anxiety disorder, unspecified: Secondary | ICD-10-CM

## 2020-10-08 DIAGNOSIS — I10 Essential (primary) hypertension: Secondary | ICD-10-CM

## 2020-10-08 NOTE — Progress Notes (Signed)
BP 132/79   Pulse 74   Ht _0  (1.854 m)   Wt 258 lb (117 kg)   SpO2 95%   BMI 34.04 kg/m    Subjective:   Patient ID: Jeff Stewart, male    DOB: 1955-09-19, 65 y.o.   MRN: [id number]  HPI: Jeff Stewart is a 65 y.o. male presenting on 10/08/2020 for Medical Management of Chronic Issues, Gastroesophageal Reflux, and Hypertension   HPI Hypertension Patient is currently on losartan and hctz and furosemide, and their blood pressure today is 132/79. Patient denies any lightheadedness or dizziness. Patient denies headaches, blurred vision, chest pains, shortness of breath, or weakness. Denies any side effects from medication and is content with current medication.   GERD Patient is currently on omeprazole.  She denies any major symptoms or abdominal pain or belching or burping. She denies any blood in her stool or lightheadedness or dizziness.   Anxiety and depression recheck Patient currently takes sertraline for anxiety and depression and feels patient is says has been down a lot of energy and having fatigue and that is what is bothering him.  He says he is ready to go try again with a sleep apnea step and thinks that is affecting him in many ways.. Depression screen [facility] 2/9 10/08/2020 09/08/2020 07/07/2020 08/09/2019 03/29/2018  Decreased Interest 1 0 0 0 1  Down, Depressed, Hopeless 0 1 3 0 0  PHQ - 2 Score _1 0 1  Altered sleeping 1 2 - 3 -  Tired, decreased energy 3 3 - 3 -  Change in appetite 0 0 - 0 -  Feeling bad or failure about yourself  0 0 - 0 -  Trouble concentrating 1 0 - 0 -  Moving slowly or fidgety/restless 2 0 - 0 -  Suicidal thoughts 0 0 - 0 -  PHQ-9 Score 8 6 - 6 -  Difficult doing work/chores - - - Not difficult at all -  Some recent data might be hidden    He is more tired and fatigued and has history of OSA and not on machine  Relevant past medical, surgical, family and social history reviewed and updated as indicated. Interim medical history since  our last visit reviewed. Allergies and medications reviewed and updated.  Review of Systems  Constitutional:  Negative for chills and fever.  Respiratory:  Negative for shortness of breath and wheezing.   Cardiovascular:  Negative for chest pain and leg swelling.  Musculoskeletal:  Negative for back pain and gait problem.  Skin:  Negative for rash.  Neurological:  Negative for dizziness, weakness and light-headedness.  All other systems reviewed and are negative.  Per HPI unless specifically indicated above   Allergies as of 10/08/2020       Reactions   Nalbuphine Nausea And Vomiting, Rash, Shortness Of Breath   Nubain [nalbuphine Hcl] Rash        Medication List        Accurate as of October 08, 2020  9:42 AM. If you have any questions, ask your nurse or doctor.          acetaminophen 500 MG tablet Commonly known as: TYLENOL Take 500 mg by mouth every 6 (six) hours as needed.   cetirizine 10 MG tablet Commonly known as: ZYRTEC Take 1 tablet (10 mg total) by mouth daily.   fluticasone 50 MCG/ACT nasal spray Commonly known as: FLONASE Place 2 sprays into both nostrils daily.   furosemide 40 MG  tablet Commonly known as: LASIX Take 1 tablet (40 mg total) by mouth every morning.   hydrochlorothiazide 25 MG tablet Commonly known as: HYDRODIURIL Take 1 tablet (25 mg total) by mouth daily.   losartan 50 MG tablet Commonly known as: COZAAR Take 1 tablet (50 mg total) by mouth 2 (two) times daily.   omeprazole 20 MG capsule Commonly known as: PRILOSEC Take 1 capsule (20 mg total) by mouth 2 (two) times daily before a meal.   sertraline 100 MG tablet Commonly known as: ZOLOFT Take 1 tablet (100 mg total) by mouth daily.   SYRINGE 3CC/21GX1-1/4" 21G X 1-1/4" 3 ML Misc 1 each by Does not apply route once a week.   testosterone cypionate 200 MG/ML injection Commonly known as: DEPOTESTOSTERONE CYPIONATE INJECT 0.5 MLS INTRAMUSCULARY ONCE A WEEK   Turmeric 500 MG  Caps Take by mouth.         Objective:   BP 132/79   Pulse 74   Ht _0  (1.854 m)   Wt 258 lb (117 kg)   SpO2 95%   BMI 34.04 kg/m   Wt Readings from Last 3 Encounters:  10/08/20 258 lb (117 kg)  09/08/20 259 lb (117.5 kg)  07/10/20 262 lb (118.8 kg)    Physical Exam Vitals and nursing note reviewed.  Constitutional:      General: He is not in acute distress.    Appearance: He is well-developed. He is not diaphoretic.  Eyes:     General: No scleral icterus.    Conjunctiva/sclera: Conjunctivae normal.  Neck:     Thyroid: No thyromegaly.  Cardiovascular:     Rate and Rhythm: Normal rate and regular rhythm.     Heart sounds: Normal heart sounds. No murmur heard. Pulmonary:     Effort: Pulmonary effort is normal. No respiratory distress.     Breath sounds: Normal breath sounds. No wheezing.  Musculoskeletal:        General: Normal range of motion.     Cervical back: Neck supple.  Lymphadenopathy:     Cervical: No cervical adenopathy.  Skin:    General: Skin is warm and dry.     Findings: No rash.  Neurological:     Mental Status: He is alert and oriented to person, place, and time.     Coordination: Coordination normal.  Psychiatric:        Behavior: Behavior normal.    Results for orders placed or performed in visit on 03/11/20  Comprehensive metabolic panel  Result Value Ref Range   Glucose 93 65 - 99 mg/dL   BUN 19 8 - 27 mg/dL   Creatinine, Ser 1.13 0.76 - 1.27 mg/dL   eGFR 71 >59 mL/min/1.73   BUN/Creatinine Ratio 17 10 - 24   Sodium 139 134 - 144 mmol/L   Potassium 4.8 3.5 - 5.2 mmol/L   Chloride 99 96 - 106 mmol/L   CO2 29 20 - 29 mmol/L   Calcium 9.4 8.6 - 10.2 mg/dL   Total Protein 6.9 6.0 - 8.5 g/dL   Albumin 4.5 3.8 - 4.8 g/dL   Globulin, Total 2.4 1.5 - 4.5 g/dL   Albumin/Globulin Ratio 1.9 1.2 - 2.2   Bilirubin Total 0.5 0.0 - 1.2 mg/dL   Alkaline Phosphatase 91 44 - 121 IU/L   AST 21 0 - 40 IU/L   ALT 14 0 - 44 IU/L  Testosterone,  Free, Total, SHBG  Result Value Ref Range   Testosterone 340 264 - 916 ng/dL  Testosterone, Free 7.6 6.6 - 18.1 pg/mL   Sex Hormone Binding 22.3 19.3 - 76.4 nmol/L    Assessment & Plan:   Problem List Items Addressed This Visit       Cardiovascular and Mediastinum   Essential hypertension, benign - Primary   Relevant Orders   CBC with Differential/Platelet   CMP14+EGFR   Lipid panel   TSH     Respiratory   OSA (obstructive sleep apnea)   Relevant Orders   Ambulatory referral to Sleep Studies     Digestive   GERD (gastroesophageal reflux disease)     Other   Anxiety and depression   Fatigue   Relevant Orders   TSH   Other Visit Diagnoses     Need for shingles vaccine       Relevant Orders   Varicella-zoster vaccine IM (Shingrix) (Completed)       Continue current medication, no changes, will keep close eye on blood pressure but really think his most important thing is he needs to go get his sleep apnea treated and did a referral for that. Follow up plan: Return in about 6 months (around 04/10/2021), or if symptoms worsen or fail to improve, for Hypertension GERD anxiety.  Counseling provided for all of the vaccine components Orders Placed This Encounter  Procedures   Varicella-zoster vaccine IM (Shingrix)   CBC with Differential/Platelet   CMP14+EGFR   Lipid panel   TSH   Ambulatory referral to Sleep Studies    Caryl Pina, MD [facility] 10/08/2020, 9:42 AM

## 2020-10-08 NOTE — Progress Notes (Signed)
BP 132/79   Pulse 74   Ht $R'6\' 1"'nI$  (1.854 m)   Wt 258 lb (117 kg)   SpO2 95%   BMI 34.04 kg/m    Subjective:   Patient ID: Jeff Stewart, male    DOB: August 21, 1955, 65 y.o.   MRN: [id number]  HPI: Jeff Stewart is a 65 y.o. male presenting on 10/08/2020 for Medical Management of Chronic Issues, Gastroesophageal Reflux, and Hypertension   HPI  HTN: [name] is on furosemide (as needed), hydrochlorothiazide, and losartan. He is taking them daily as prescribed and has no complaints of side effects. He mentioned that during the week he has been experiencing episodes of dizziness and nausea. He checks his blood pressure when he has these episodes and they have been high (systolic around 672C and diastolic up to 947 sometimes). He does not report any low BP readings.    Sleep Apnea: Jeff Stewart has a history of sleep apnea. He had a sleep study a few years ago and was prescribed a CPAP machine. He cannot tolerate the machine and does not use it. He stated that he feels tired everyday even when he wakes up. He finds himself falling asleep when he sits down in his recliner during the day. He is willing to try another sleep study and is interested in discussing other options to the CPAP machine he currently has.   Questions about thyroid: Jeff Stewart would like to know if thyroid issues could be causing lethargy. He does not have a history of thyroid problems. He is not sensitive to cold and does not report any abnormal muscle aches. He would like to have his TSH levels checked today.   Review of Systems  Constitutional:  Positive for fatigue.  Respiratory: Negative.    Cardiovascular: Negative.   Gastrointestinal: Negative.    Per HPI unless specifically indicated above   Allergies as of 10/08/2020       Reactions   Nalbuphine Nausea And Vomiting, Rash, Shortness Of Breath   Nubain [nalbuphine Hcl] Rash        Medication List        Accurate as of October 08, 2020  9:39 AM. If you have any  questions, ask your nurse or doctor.          acetaminophen 500 MG tablet Commonly known as: TYLENOL Take 500 mg by mouth every 6 (six) hours as needed.   cetirizine 10 MG tablet Commonly known as: ZYRTEC Take 1 tablet (10 mg total) by mouth daily.   fluticasone 50 MCG/ACT nasal spray Commonly known as: FLONASE Place 2 sprays into both nostrils daily.   furosemide 40 MG tablet Commonly known as: LASIX Take 1 tablet (40 mg total) by mouth every morning.   hydrochlorothiazide 25 MG tablet Commonly known as: HYDRODIURIL Take 1 tablet (25 mg total) by mouth daily.   losartan 50 MG tablet Commonly known as: COZAAR Take 1 tablet (50 mg total) by mouth 2 (two) times daily.   omeprazole 20 MG capsule Commonly known as: PRILOSEC Take 1 capsule (20 mg total) by mouth 2 (two) times daily before a meal.   sertraline 100 MG tablet Commonly known as: ZOLOFT Take 1 tablet (100 mg total) by mouth daily.   SYRINGE 3CC/21GX1-1/4" 21G X 1-1/4" 3 ML Misc 1 each by Does not apply route once a week.   testosterone cypionate 200 MG/ML injection Commonly known as: DEPOTESTOSTERONE CYPIONATE INJECT 0.5 MLS INTRAMUSCULARY ONCE A WEEK   Turmeric 500 MG Caps Take  by mouth.         Objective:   BP 132/79   Pulse 74   Ht $R'6\' 1"'wF$  (1.854 m)   Wt 258 lb (117 kg)   SpO2 95%   BMI 34.04 kg/m   Wt Readings from Last 3 Encounters:  10/08/20 258 lb (117 kg)  09/08/20 259 lb (117.5 kg)  07/10/20 262 lb (118.8 kg)    Physical Exam Constitutional:      Appearance: Normal appearance.  Cardiovascular:     Rate and Rhythm: Normal rate and regular rhythm.     Pulses: Normal pulses.  Pulmonary:     Effort: Pulmonary effort is normal.     Breath sounds: Normal breath sounds.  Neurological:     Mental Status: He is alert.    Results for orders placed or performed in visit on 03/11/20  Comprehensive metabolic panel  Result Value Ref Range   Glucose 93 65 - 99 mg/dL   BUN 19 8 - 27  mg/dL   Creatinine, Ser 1.13 0.76 - 1.27 mg/dL   eGFR 71 >59 mL/min/1.73   BUN/Creatinine Ratio 17 10 - 24   Sodium 139 134 - 144 mmol/L   Potassium 4.8 3.5 - 5.2 mmol/L   Chloride 99 96 - 106 mmol/L   CO2 29 20 - 29 mmol/L   Calcium 9.4 8.6 - 10.2 mg/dL   Total Protein 6.9 6.0 - 8.5 g/dL   Albumin 4.5 3.8 - 4.8 g/dL   Globulin, Total 2.4 1.5 - 4.5 g/dL   Albumin/Globulin Ratio 1.9 1.2 - 2.2   Bilirubin Total 0.5 0.0 - 1.2 mg/dL   Alkaline Phosphatase 91 44 - 121 IU/L   AST 21 0 - 40 IU/L   ALT 14 0 - 44 IU/L  Testosterone, Free, Total, SHBG  Result Value Ref Range   Testosterone 340 264 - 916 ng/dL   Testosterone, Free 7.6 6.6 - 18.1 pg/mL   Sex Hormone Binding 22.3 19.3 - 76.4 nmol/L    Assessment & Plan:   HTN: Continue to take hydrochlorothiazide and losartan daily and furosemide as needed. Since he has been having episodes of high blood pressure, advised to take BP daily for the next few weeks and record. If there are a lot of readings over 150/90 please let us know. Also advised Pace to stay hydrated in the summer heat since he is taking diuretics and dehydration can cause dizziness episodes.  Sleep Apnea:Jeff Stewart has a diagnosis of obstructive sleep apnea but is not able to tolerate his CPAP machine. Referral was placed for another sleep study and to discuss other options to treat his sleep apnea.   Questions about thyroid: Jeff Stewart is likely feeing tired when he wakes up and throughout the day due to his sleep apnea. He does not have other symptoms of hypothyroidism - I.e cold intolerance and muscle aches. Will check his thyroid today with his other blood work.  General: Jeff Stewart was due and received Shingrix shot today.

## 2020-10-09 LAB — CBC WITH DIFFERENTIAL/PLATELET
Basophils Absolute: 0 10*3/uL (ref 0.0–0.2)
Basos: 1 %
EOS (ABSOLUTE): 0.1 10*3/uL (ref 0.0–0.4)
Eos: 2 %
Hematocrit: 43 % (ref 37.5–51.0)
Hemoglobin: 14.2 g/dL (ref 13.0–17.7)
Immature Grans (Abs): 0 10*3/uL (ref 0.0–0.1)
Immature Granulocytes: 0 %
Lymphocytes Absolute: 1.5 10*3/uL (ref 0.7–3.1)
Lymphs: 27 %
MCH: 27 pg (ref 26.6–33.0)
MCHC: 33 g/dL (ref 31.5–35.7)
MCV: 82 fL (ref 79–97)
Monocytes Absolute: 0.5 10*3/uL (ref 0.1–0.9)
Monocytes: 8 %
Neutrophils Absolute: 3.4 10*3/uL (ref 1.4–7.0)
Neutrophils: 62 %
Platelets: 177 10*3/uL (ref 150–450)
RBC: 5.26 x10E6/uL (ref 4.14–5.80)
RDW: 13.5 % (ref 11.6–15.4)
WBC: 5.6 10*3/uL (ref 3.4–10.8)

## 2020-10-09 LAB — CMP14+EGFR
ALT: 13 IU/L (ref 0–44)
AST: 18 IU/L (ref 0–40)
Albumin/Globulin Ratio: 2.3 — ABNORMAL HIGH (ref 1.2–2.2)
Albumin: 4.6 g/dL (ref 3.8–4.8)
Alkaline Phosphatase: 93 IU/L (ref 44–121)
BUN/Creatinine Ratio: 15 (ref 10–24)
BUN: 15 mg/dL (ref 8–27)
Bilirubin Total: 0.6 mg/dL (ref 0.0–1.2)
CO2: 28 mmol/L (ref 20–29)
Calcium: 9.2 mg/dL (ref 8.6–10.2)
Chloride: 96 mmol/L (ref 96–106)
Creatinine, Ser: 0.97 mg/dL (ref 0.76–1.27)
Globulin, Total: 2 g/dL (ref 1.5–4.5)
Glucose: 84 mg/dL (ref 65–99)
Potassium: 4 mmol/L (ref 3.5–5.2)
Sodium: 140 mmol/L (ref 134–144)
Total Protein: 6.6 g/dL (ref 6.0–8.5)
eGFR: 86 mL/min/{1.73_m2} (ref >59)

## 2020-10-09 LAB — LIPID PANEL
Chol/HDL Ratio: 3.5 ratio (ref 0.0–5.0)
Cholesterol, Total: 146 mg/dL (ref 100–199)
HDL: 42 mg/dL (ref >39)
LDL Chol Calc (NIH): 86 mg/dL (ref 0–99)
Triglycerides: 93 mg/dL (ref 0–149)
VLDL Cholesterol Cal: 18 mg/dL (ref 5–40)

## 2020-10-09 LAB — TSH: TSH: 2.88 u[IU]/mL (ref 0.450–4.500)

## 2020-10-14 DIAGNOSIS — G4733 Obstructive sleep apnea (adult) (pediatric): Secondary | ICD-10-CM | POA: Diagnosis not present

## 2020-10-15 DIAGNOSIS — G4733 Obstructive sleep apnea (adult) (pediatric): Secondary | ICD-10-CM | POA: Diagnosis not present

## 2020-10-28 DIAGNOSIS — M5136 Other intervertebral disc degeneration, lumbar region: Secondary | ICD-10-CM | POA: Diagnosis not present

## 2020-10-28 DIAGNOSIS — Z978 Presence of other specified devices: Secondary | ICD-10-CM | POA: Diagnosis not present

## 2020-10-28 DIAGNOSIS — G894 Chronic pain syndrome: Secondary | ICD-10-CM | POA: Diagnosis not present

## 2020-10-28 DIAGNOSIS — M961 Postlaminectomy syndrome, not elsewhere classified: Secondary | ICD-10-CM | POA: Diagnosis not present

## 2020-10-28 DIAGNOSIS — M5030 Other cervical disc degeneration, unspecified cervical region: Secondary | ICD-10-CM | POA: Diagnosis not present

## 2020-10-29 DIAGNOSIS — M13841 Other specified arthritis, right hand: Secondary | ICD-10-CM | POA: Diagnosis not present

## 2020-10-29 DIAGNOSIS — M7712 Lateral epicondylitis, left elbow: Secondary | ICD-10-CM | POA: Diagnosis not present

## 2020-11-05 ENCOUNTER — Other Ambulatory Visit: Payer: Medicare Other

## 2020-11-05 ENCOUNTER — Other Ambulatory Visit: Payer: Self-pay

## 2020-11-05 ENCOUNTER — Other Ambulatory Visit: Payer: Self-pay | Admitting: "Endocrinology

## 2020-11-05 DIAGNOSIS — E291 Testicular hypofunction: Secondary | ICD-10-CM

## 2020-11-06 ENCOUNTER — Other Ambulatory Visit: Payer: Medicare Other

## 2020-11-11 LAB — TESTOSTERONE,FREE AND TOTAL
Testosterone, Free: 7.8 pg/mL (ref 6.6–18.1)
Testosterone: 488 ng/dL (ref 264–916)

## 2020-11-11 LAB — PSA: Prostate Specific Ag, Serum: 1 ng/mL (ref 0.0–4.0)

## 2020-11-12 ENCOUNTER — Other Ambulatory Visit: Payer: Self-pay

## 2020-11-12 ENCOUNTER — Encounter: Payer: Self-pay | Admitting: "Endocrinology

## 2020-11-12 ENCOUNTER — Ambulatory Visit (INDEPENDENT_AMBULATORY_CARE_PROVIDER_SITE_OTHER): Payer: Medicare Other | Admitting: "Endocrinology

## 2020-11-12 VITALS — BP 118/62 | HR 68 | Ht 73.0 in | Wt 257.2 lb

## 2020-11-12 DIAGNOSIS — E291 Testicular hypofunction: Secondary | ICD-10-CM

## 2020-11-12 DIAGNOSIS — Z789 Other specified health status: Secondary | ICD-10-CM | POA: Diagnosis not present

## 2020-11-12 MED ORDER — TESTOSTERONE CYPIONATE 200 MG/ML IM SOLN: INTRAMUSCULAR | 0 refills | Status: DC

## 2020-11-12 NOTE — Progress Notes (Signed)
11/12/2020                       Endocrinology follow-up note   Subjective:    Patient ID: Jeff Stewart, male    DOB: 12-04-1955, PCP Dettinger, Fransisca Kaufmann, MD   Past Medical History:  Diagnosis Date  . Anxiety   . Carpal tunnel syndrome, bilateral   . Cataract    removed years ago  . Chronic back pain    internal morphine pump  . DDD (degenerative disc disease)    neck, lumbar  . Depression   . Dyslipidemia    diet controlled  . GERD (gastroesophageal reflux disease)    past hx- had nissen fundiplication   . Hypertension    borderline  . Sleep apnea    wears C-PAP  . Status post insertion of spinal cord stimulator    Past Surgical History:  Procedure Laterality Date  . BACK SURGERY     x 6  . CARPAL TUNNEL RELEASE     bilateral  . COLONOSCOPY    . ELBOW SURGERY    . INGUINAL HERNIA REPAIR Right   . INTERNAL MORPHINE PUMP     . KNEE ARTHROSCOPY    . NASAL SEPTUM SURGERY     x 6  . neck fusion     . NISSEN FUNDOPLICATION    . SPINAL CORD STIMULATOR INSERTION    . STOMACH SURGERY     Nissen Fundiplication  . TOTAL KNEE ARTHROPLASTY Left 04/24/2018   Procedure: TOTAL KNEE ARTHROPLASTY;  Surgeon: Renette Butters, MD;  Location: [facility];  Service: Orthopedics;  Laterality: Left;  . UPPER GASTROINTESTINAL ENDOSCOPY     Social History   Socioeconomic History  . Marital status: Married    Spouse name: Not on file  . Number of children: 3  . Years of education: Not on file  . Highest education level: Not on file  Occupational History  . Occupation: [employer]: Retired  Tobacco Use  . Smoking status: Never  . Smokeless tobacco: Never  Vaping Use  . Vaping Use: Never used  Substance and Sexual Activity  . Alcohol use: No  . Drug use: No  . Sexual activity: Not Currently    Birth control/protection: Post-menopausal    Comment: married for 1972  Other Topics Concern  . Not on file  Social History Narrative   Lives with wife     Social Determinants of Health   Financial Resource Strain: Not on file  Food Insecurity: Not on file  Transportation Needs: Not on file  Physical Activity: Not on file  Stress: Not on file  Social Connections: Not on file   Outpatient Encounter Medications as of 11/12/2020  Medication Sig  . acetaminophen (TYLENOL) 500 MG tablet Take 500 mg by mouth every 6 (six) hours as needed.  . cetirizine (ZYRTEC) 10 MG tablet Take 1 tablet (10 mg total) by mouth daily.  . fluticasone (FLONASE) 50 MCG/ACT nasal spray Place 2 sprays into both nostrils daily.  . furosemide (LASIX) 40 MG tablet Take 1 tablet (40 mg total) by mouth every morning.  . hydrochlorothiazide (HYDRODIURIL) 25 MG tablet Take 1 tablet (25 mg total) by mouth daily.  Marland Kitchen losartan (COZAAR) 50 MG tablet Take 1 tablet (50 mg total) by mouth 2 (two) times daily.  Marland Kitchen omeprazole (PRILOSEC) 20 MG capsule Take 1 capsule (20 mg total) by mouth 2 (two) times daily before a meal.  .  sertraline (ZOLOFT) 100 MG tablet Take 1 tablet (100 mg total) by mouth daily.  . Syringe/Needle, Disp, (SYRINGE 3CC/21GX1-1/4") 21G X 1-1/4" 3 ML MISC 1 each by Does not apply route once a week.  . testosterone cypionate (DEPOTESTOSTERONE CYPIONATE) 200 MG/ML injection INJECT 0.5 MLS INTRAMUSCULARY ONCE A WEEK  . Turmeric 500 MG CAPS Take by mouth.  . [DISCONTINUED] testosterone cypionate (DEPOTESTOSTERONE CYPIONATE) 200 MG/ML injection INJECT 0.5 MLS INTRAMUSCULARY ONCE A WEEK   No facility-administered encounter medications on file as of 11/12/2020.   ALLERGIES: Allergies  Allergen Reactions  . Nalbuphine Nausea And Vomiting, Rash and Shortness Of Breath  . Nubain [Nalbuphine Hcl] Rash   VACCINATION STATUS: Immunization History  Administered Date(s) Administered  . Fluad Quad(high Dose 65+) 12/21/2018  . Influenza Split 01/19/2014  . Influenza,inj,Quad PF,6+ Mos 01/14/2015, 02/02/2016, 02/08/2018  . Influenza-Unspecified 02/19/2014, 02/24/2020  .  Moderna Sars-Covid-2 Vaccination 05/12/2019, 05/23/2019, 03/10/2020  . Pneumococcal Conjugate-13 08/09/2019  . Pneumococcal Polysaccharide-23 04/23/2020  . Tdap 12/11/2014  . Zoster Recombinat (Shingrix) 04/23/2020, 10/08/2020    HPI Jeff Stewart is a 65 year old gentleman with a medical history as above.  He is being seen in follow-up for hypogonadism on testosterone replacement therapy.   -He remains on testosterone cypionate 100 mg IM every 7 days.   He continues to feel better, no new complaints today.     -He reports better energy level, better mobility. He was known to have low testosterone for at least since age 89.  His previsit labs show total testosterone of 340-remaining stable.  No evidence of adverse effects from testosterone treatment.  He wishes to be continued on testosterone placement therapy. He fathers 3 grown children. He denies history of head injury , has had nasal septal surgery multiple times. He denies injury to the testicles, exposure to chemotherapy, exposure to radiation to the genitals. However, due to chronic back injury and surgery he has exposure to heavy opioid therapy for pain control. He is currently on morphine pump. He has been off and  on  opioids for pain control for the last 10-15 years.  He struggles with sleep apnea, even changing CPAP machines did not help much.  Review of Systems Limited as above.  Objective:    BP 118/62   Pulse 68   Ht _0  (1.854 m)   Wt 257 lb 3.2 oz (116.7 kg)   BMI 33.93 kg/m   Wt Readings from Last 3 Encounters:  11/12/20 257 lb 3.2 oz (116.7 kg)  10/08/20 258 lb (117 kg)  09/08/20 259 lb (117.5 kg)      From previous exam.  Genitourinary system: He has bilaterally shrunk testicles, right testes 12 mL, left testes 15 mL. No scrotal mass, no varicocele,  no inguinal lymphadenopathy.    Recent Results (from the past 2160 hour(s))  CBC with Differential/Platelet     Status: None   Collection Time: 10/08/20   9:44 AM  Result Value Ref Range   WBC 5.6 3.4 - 10.8 x10E3/uL   RBC 5.26 4.14 - 5.80 x10E6/uL   Hemoglobin 14.2 13.0 - 17.7 g/dL   Hematocrit 43.0 37.5 - 51.0 %   MCV 82 79 - 97 fL   MCH 27.0 26.6 - 33.0 pg   MCHC 33.0 31.5 - 35.7 g/dL   RDW 13.5 11.6 - 15.4 %   Platelets 177 150 - 450 x10E3/uL   Neutrophils 62 Not Estab. %   Lymphs 27 Not Estab. %   Monocytes 8 Not Estab. %  Eos 2 Not Estab. %   Basos 1 Not Estab. %   Neutrophils Absolute 3.4 1.4 - 7.0 x10E3/uL   Lymphocytes Absolute 1.5 0.7 - 3.1 x10E3/uL   Monocytes Absolute 0.5 0.1 - 0.9 x10E3/uL   EOS (ABSOLUTE) 0.1 0.0 - 0.4 x10E3/uL   Basophils Absolute 0.0 0.0 - 0.2 x10E3/uL   Immature Granulocytes 0 Not Estab. %   Immature Grans (Abs) 0.0 0.0 - 0.1 x10E3/uL  CMP14+EGFR     Status: Abnormal   Collection Time: 10/08/20  9:44 AM  Result Value Ref Range   Glucose 84 65 - 99 mg/dL   BUN 15 8 - 27 mg/dL   Creatinine, Ser 0.97 0.76 - 1.27 mg/dL   eGFR 86 >59 mL/min/1.73   BUN/Creatinine Ratio 15 10 - 24   Sodium 140 134 - 144 mmol/L   Potassium 4.0 3.5 - 5.2 mmol/L   Chloride 96 96 - 106 mmol/L   CO2 28 20 - 29 mmol/L   Calcium 9.2 8.6 - 10.2 mg/dL   Total Protein 6.6 6.0 - 8.5 g/dL   Albumin 4.6 3.8 - 4.8 g/dL   Globulin, Total 2.0 1.5 - 4.5 g/dL   Albumin/Globulin Ratio 2.3 (H) 1.2 - 2.2   Bilirubin Total 0.6 0.0 - 1.2 mg/dL   Alkaline Phosphatase 93 44 - 121 IU/L   AST 18 0 - 40 IU/L   ALT 13 0 - 44 IU/L  Lipid panel     Status: None   Collection Time: 10/08/20  9:44 AM  Result Value Ref Range   Cholesterol, Total 146 100 - 199 mg/dL   Triglycerides 93 0 - 149 mg/dL   HDL 42 >39 mg/dL   VLDL Cholesterol Cal 18 5 - 40 mg/dL   LDL Chol Calc (NIH) 86 0 - 99 mg/dL   Chol/HDL Ratio 3.5 0.0 - 5.0 ratio    Comment:                                   T. Chol/HDL Ratio                                             Men  Women                               1/2 Avg.Risk  3.4    3.3                                    Avg.Risk  5.0    4.4                                2X Avg.Risk  9.6    7.1                                3X Avg.Risk 23.4   11.0   TSH     Status: None   Collection Time: 10/08/20  9:44 AM  Result Value Ref Range   TSH 2.880 0.450 - 4.500 uIU/mL  Testosterone,Free and Total  Status: None   Collection Time: 11/06/20  8:40 AM  Result Value Ref Range   Testosterone 488 264 - 916 ng/dL    Comment: Adult male reference interval is based on a population of healthy nonobese males (BMI <30) between 61 and 36 years old. [place], [place] [phone]. PMID: [id number].    Testosterone, Free 7.8 6.6 - 18.1 pg/mL  PSA     Status: None   Collection Time: 11/06/20  8:40 AM  Result Value Ref Range   Prostate Specific Ag, Serum 1.0 0.0 - 4.0 ng/mL    Comment: Roche ECLIA methodology. According to the American Urological Association, Serum PSA should decrease and remain at undetectable levels after radical prostatectomy. The AUA defines biochemical recurrence as an initial PSA value 0.2 ng/mL or greater followed by a subsequent confirmatory PSA value 0.2 ng/mL or greater. Values obtained with different assay methods or kits cannot be used interchangeably. Results cannot be interpreted as absolute evidence of the presence or absence of malignant disease.      Assessment & Plan:   1. Hypogonadism  2.  Obesity/sleep apnea  -Testosterone replacement helped improve his testosterone from 74 ->286 -> 782 -> 509 > 978 > 659-> 437 >494> 327-> 506-> 640 -> 673 -> 529 -> 446 -> 340 -> 488   No adverse events noted. 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   -It is possible that it is multifactorial including exposure to heavy opioids, multiple back surgeries, prior inguinal surgery, or idiopathic.  -Prolactin level is favorable at 6.1, he will not need pituitary/sella imaging for now.  -He has benefited from  testosterone replacement therapy and he wishes to be continued on treatment.      -He is advised to continue testosterone 100 mg IM weekly for target testosterone level 300-600 mcg/dL.   -He we will return in 64-month with total and free testosterone measurements.   Regarding his sleep apnea: He would benefit the most from weight loss. - he acknowledges that there is a room for improvement in his food and drink choices. - Suggestion is made for him to avoid simple carbohydrates  from his diet including Cakes, Sweet Desserts, Ice Cream, Soda (diet and regular), Sweet Tea, Candies, Chips, Cookies, Store Bought Juices, Alcohol in Excess of  1-2 drinks a day, Artificial Sweeteners,  Coffee Creamer, and "Sugar-free" Products, Lemonade. This will help patient to have more stable blood glucose profile and potentially avoid unintended weight gain.   - I advised patient to maintain close follow up with Dettinger, JFransisca Kaufmann MD for primary care needs.   I spent 30 minutes in the care of the patient today including review of labs from Thyroid Function, CMP, and other relevant labs ; imaging/biopsy records (current and previous including abstractions from other facilities); face-to-face time discussing  his lab results and symptoms, medications doses, his options of short and long term treatment based on the latest standards of care / guidelines;   and documenting the encounter.  TLevonne SpillerDalton  participated in the discussions, expressed understanding, and voiced agreement with the above plans.  All questions were answered to his satisfaction. he is encouraged to contact clinic should he have any questions or concerns prior to his return visit.   Follow up plan: Return in about 4 months (around 03/14/2021) for Fasting Labs  in AM B4 8.  GGlade Lloyd MD Phone: [phone] Fax: [phone]  This note  was partially dictated with voice recognition software. Similar sounding words can be transcribed  inadequately or may not  be corrected upon review.  11/12/2020, 10:30 AM

## 2020-11-13 DIAGNOSIS — Z6833 Body mass index (BMI) 33.0-33.9, adult: Secondary | ICD-10-CM | POA: Insufficient documentation

## 2020-11-13 DIAGNOSIS — G4733 Obstructive sleep apnea (adult) (pediatric): Secondary | ICD-10-CM | POA: Diagnosis not present

## 2020-11-27 DIAGNOSIS — M13841 Other specified arthritis, right hand: Secondary | ICD-10-CM | POA: Diagnosis not present

## 2020-12-22 ENCOUNTER — Ambulatory Visit (INDEPENDENT_AMBULATORY_CARE_PROVIDER_SITE_OTHER): Payer: Medicare Other | Admitting: *Deleted

## 2020-12-22 ENCOUNTER — Other Ambulatory Visit: Payer: Self-pay

## 2020-12-22 DIAGNOSIS — Z23 Encounter for immunization: Secondary | ICD-10-CM | POA: Diagnosis not present

## 2020-12-22 NOTE — Progress Notes (Signed)
Patient in today for flu vaccine. Tolerated well.

## 2020-12-24 DIAGNOSIS — L718 Other rosacea: Secondary | ICD-10-CM | POA: Diagnosis not present

## 2020-12-24 DIAGNOSIS — L570 Actinic keratosis: Secondary | ICD-10-CM | POA: Diagnosis not present

## 2020-12-24 DIAGNOSIS — C4442 Squamous cell carcinoma of skin of scalp and neck: Secondary | ICD-10-CM | POA: Diagnosis not present

## 2020-12-24 DIAGNOSIS — K130 Diseases of lips: Secondary | ICD-10-CM | POA: Diagnosis not present

## 2020-12-24 DIAGNOSIS — D485 Neoplasm of uncertain behavior of skin: Secondary | ICD-10-CM | POA: Diagnosis not present

## 2020-12-30 DIAGNOSIS — M5030 Other cervical disc degeneration, unspecified cervical region: Secondary | ICD-10-CM | POA: Diagnosis not present

## 2020-12-30 DIAGNOSIS — G894 Chronic pain syndrome: Secondary | ICD-10-CM | POA: Diagnosis not present

## 2020-12-30 DIAGNOSIS — Z5181 Encounter for therapeutic drug level monitoring: Secondary | ICD-10-CM | POA: Diagnosis not present

## 2020-12-30 DIAGNOSIS — Z79899 Other long term (current) drug therapy: Secondary | ICD-10-CM | POA: Diagnosis not present

## 2020-12-30 DIAGNOSIS — M5136 Other intervertebral disc degeneration, lumbar region: Secondary | ICD-10-CM | POA: Diagnosis not present

## 2020-12-30 DIAGNOSIS — M961 Postlaminectomy syndrome, not elsewhere classified: Secondary | ICD-10-CM | POA: Diagnosis not present

## 2021-01-21 ENCOUNTER — Encounter: Payer: Self-pay | Admitting: Family Medicine

## 2021-01-21 ENCOUNTER — Ambulatory Visit (INDEPENDENT_AMBULATORY_CARE_PROVIDER_SITE_OTHER): Payer: Medicare Other | Admitting: Family Medicine

## 2021-01-21 DIAGNOSIS — Z01818 Encounter for other preprocedural examination: Secondary | ICD-10-CM | POA: Diagnosis not present

## 2021-01-21 NOTE — Progress Notes (Signed)
Virtual Visit via Telephone Note  I connected with Jeff Stewart on 01/21/21 at 10:31 AM by telephone and verified that I am speaking with the correct person using two identifiers. Jeff Stewart is currently located at home and nobody is currently with him during this visit. The provider, Loman Brooklyn, FNP is located in their office at time of visit.  I discussed the limitations, risks, security and privacy concerns of performing an evaluation and management service by telephone and the availability of in person appointments. I also discussed with the patient that there may be a patient responsible charge related to this service. The patient expressed understanding and agreed to proceed.  Subjective: PCP: Dettinger, Fransisca Kaufmann, MD  Chief Complaint  Patient presents with   COVID test   Patient is in need of a COVID test prior to an upcoming surgery. Denies any symptoms.   ROS: Per HPI  Current Outpatient Medications:    acetaminophen (TYLENOL) 500 MG tablet, Take 500 mg by mouth every 6 (six) hours as needed., Disp: , Rfl:    cetirizine (ZYRTEC) 10 MG tablet, Take 1 tablet (10 mg total) by mouth daily., Disp: 90 tablet, Rfl: 3   fluticasone (FLONASE) 50 MCG/ACT nasal spray, Place 2 sprays into both nostrils daily., Disp: 16 g, Rfl: 11   furosemide (LASIX) 40 MG tablet, Take 1 tablet (40 mg total) by mouth every morning., Disp: 90 tablet, Rfl: 3   hydrochlorothiazide (HYDRODIURIL) 25 MG tablet, Take 1 tablet (25 mg total) by mouth daily., Disp: 90 tablet, Rfl: 3   losartan (COZAAR) 50 MG tablet, Take 1 tablet (50 mg total) by mouth 2 (two) times daily., Disp: 180 tablet, Rfl: 3   omeprazole (PRILOSEC) 20 MG capsule, Take 1 capsule (20 mg total) by mouth 2 (two) times daily before a meal., Disp: 180 capsule, Rfl: 3   sertraline (ZOLOFT) 100 MG tablet, Take 1 tablet (100 mg total) by mouth daily., Disp: 90 tablet, Rfl: 3   Syringe/Needle, Disp, (SYRINGE 3CC/21GX1-1/4") 21G X 1-1/4"  3 ML MISC, 1 each by Does not apply route once a week., Disp: 100 each, Rfl: 0   testosterone cypionate (DEPOTESTOSTERONE CYPIONATE) 200 MG/ML injection, INJECT 0.5 MLS INTRAMUSCULARY ONCE A WEEK, Disp: 10 mL, Rfl: 0   Turmeric 500 MG CAPS, Take by mouth., Disp: , Rfl:   Allergies  Allergen Reactions   Nalbuphine Nausea And Vomiting, Rash and Shortness Of Breath   Nubain [Nalbuphine Hcl] Rash   Past Medical History:  Diagnosis Date   Anxiety    Carpal tunnel syndrome, bilateral    Cataract    removed years ago   Chronic back pain    internal morphine pump   DDD (degenerative disc disease)    neck, lumbar   Depression    Dyslipidemia    diet controlled   GERD (gastroesophageal reflux disease)    past hx- had nissen fundiplication    Hypertension    borderline   Sleep apnea    wears C-PAP   Status post insertion of spinal cord stimulator     Observations/Objective: A&O  No respiratory distress or wheezing audible over the phone Mood, judgement, and thought processes all WNL  Assessment and Plan: 1. Pre-op testing - Novel Coronavirus, NAA ([facility])   Follow Up Instructions:  I discussed the assessment and treatment plan with the patient. The patient was provided an opportunity to ask questions and all were answered. The patient agreed with the plan and demonstrated an  understanding of the instructions.   The patient was advised to call back or seek an in-person evaluation if the symptoms worsen or if the condition fails to improve as anticipated.  The above assessment and management plan was discussed with the patient. The patient verbalized understanding of and has agreed to the management plan. Patient is aware to call the clinic if symptoms persist or worsen. Patient is aware when to return to the clinic for a follow-up visit. Patient educated on when it is appropriate to go to the emergency department.   Time call ended: 10:36 AM  I provided 5 minutes of  non-face-to-face time during this encounter.  Hendricks Limes, MSN, APRN, FNP-C [facility] 01/21/21

## 2021-01-22 LAB — SARS-COV-2, NAA 2 DAY TAT

## 2021-01-22 LAB — NOVEL CORONAVIRUS, NAA: SARS-CoV-2, NAA: NOT DETECTED

## 2021-01-27 DIAGNOSIS — G8918 Other acute postprocedural pain: Secondary | ICD-10-CM | POA: Diagnosis not present

## 2021-01-27 DIAGNOSIS — M13841 Other specified arthritis, right hand: Secondary | ICD-10-CM | POA: Diagnosis not present

## 2021-01-27 DIAGNOSIS — M1811 Unilateral primary osteoarthritis of first carpometacarpal joint, right hand: Secondary | ICD-10-CM | POA: Diagnosis not present

## 2021-02-02 DIAGNOSIS — S0512XA Contusion of eyeball and orbital tissues, left eye, initial encounter: Secondary | ICD-10-CM | POA: Diagnosis not present

## 2021-02-09 DIAGNOSIS — M13841 Other specified arthritis, right hand: Secondary | ICD-10-CM | POA: Diagnosis not present

## 2021-02-25 DIAGNOSIS — M13841 Other specified arthritis, right hand: Secondary | ICD-10-CM | POA: Diagnosis not present

## 2021-02-25 DIAGNOSIS — M25641 Stiffness of right hand, not elsewhere classified: Secondary | ICD-10-CM | POA: Diagnosis not present

## 2021-03-03 DIAGNOSIS — Z978 Presence of other specified devices: Secondary | ICD-10-CM | POA: Diagnosis not present

## 2021-03-03 DIAGNOSIS — Z79899 Other long term (current) drug therapy: Secondary | ICD-10-CM | POA: Diagnosis not present

## 2021-03-03 DIAGNOSIS — G894 Chronic pain syndrome: Secondary | ICD-10-CM | POA: Diagnosis not present

## 2021-03-03 DIAGNOSIS — M5136 Other intervertebral disc degeneration, lumbar region: Secondary | ICD-10-CM | POA: Diagnosis not present

## 2021-03-03 DIAGNOSIS — M5030 Other cervical disc degeneration, unspecified cervical region: Secondary | ICD-10-CM | POA: Diagnosis not present

## 2021-03-03 DIAGNOSIS — M961 Postlaminectomy syndrome, not elsewhere classified: Secondary | ICD-10-CM | POA: Diagnosis not present

## 2021-03-03 DIAGNOSIS — Z5181 Encounter for therapeutic drug level monitoring: Secondary | ICD-10-CM | POA: Diagnosis not present

## 2021-03-09 DIAGNOSIS — D044 Carcinoma in situ of skin of scalp and neck: Secondary | ICD-10-CM | POA: Diagnosis not present

## 2021-03-12 ENCOUNTER — Telehealth: Payer: Self-pay | Admitting: Family Medicine

## 2021-03-12 NOTE — Telephone Encounter (Signed)
Left message to call back and schedule AWV

## 2021-03-16 ENCOUNTER — Other Ambulatory Visit: Payer: Medicare Other

## 2021-03-17 ENCOUNTER — Telehealth: Payer: Self-pay | Admitting: Family Medicine

## 2021-03-17 DIAGNOSIS — M25541 Pain in joints of right hand: Secondary | ICD-10-CM | POA: Diagnosis not present

## 2021-03-17 DIAGNOSIS — R531 Weakness: Secondary | ICD-10-CM | POA: Diagnosis not present

## 2021-03-17 DIAGNOSIS — M25531 Pain in right wrist: Secondary | ICD-10-CM | POA: Diagnosis not present

## 2021-03-17 DIAGNOSIS — M25441 Effusion, right hand: Secondary | ICD-10-CM | POA: Diagnosis not present

## 2021-03-17 DIAGNOSIS — M25641 Stiffness of right hand, not elsewhere classified: Secondary | ICD-10-CM | POA: Diagnosis not present

## 2021-03-17 DIAGNOSIS — M13841 Other specified arthritis, right hand: Secondary | ICD-10-CM | POA: Diagnosis not present

## 2021-03-18 LAB — TESTOSTERONE, FREE, TOTAL, SHBG
Sex Hormone Binding: 20.4 nmol/L (ref 19.3–76.4)
Testosterone, Free: 8.4 pg/mL (ref 6.6–18.1)
Testosterone: 325 ng/dL (ref 264–916)

## 2021-03-23 ENCOUNTER — Other Ambulatory Visit: Payer: Self-pay | Admitting: Family Medicine

## 2021-03-23 DIAGNOSIS — K219 Gastro-esophageal reflux disease without esophagitis: Secondary | ICD-10-CM

## 2021-03-23 DIAGNOSIS — M13841 Other specified arthritis, right hand: Secondary | ICD-10-CM | POA: Diagnosis not present

## 2021-03-23 DIAGNOSIS — R531 Weakness: Secondary | ICD-10-CM | POA: Diagnosis not present

## 2021-03-23 DIAGNOSIS — M25641 Stiffness of right hand, not elsewhere classified: Secondary | ICD-10-CM | POA: Diagnosis not present

## 2021-03-23 DIAGNOSIS — M25441 Effusion, right hand: Secondary | ICD-10-CM | POA: Diagnosis not present

## 2021-03-23 DIAGNOSIS — M25531 Pain in right wrist: Secondary | ICD-10-CM | POA: Diagnosis not present

## 2021-03-23 DIAGNOSIS — M25541 Pain in joints of right hand: Secondary | ICD-10-CM | POA: Diagnosis not present

## 2021-03-25 ENCOUNTER — Ambulatory Visit: Payer: Medicare Other | Admitting: "Endocrinology

## 2021-03-25 ENCOUNTER — Encounter: Payer: Self-pay | Admitting: "Endocrinology

## 2021-03-25 ENCOUNTER — Other Ambulatory Visit: Payer: Self-pay

## 2021-03-25 VITALS — BP 136/88 | HR 72 | Ht 73.0 in | Wt 264.1 lb

## 2021-03-25 DIAGNOSIS — E291 Testicular hypofunction: Secondary | ICD-10-CM

## 2021-03-25 DIAGNOSIS — M13841 Other specified arthritis, right hand: Secondary | ICD-10-CM | POA: Diagnosis not present

## 2021-03-25 MED ORDER — TESTOSTERONE CYPIONATE 200 MG/ML IM SOLN: INTRAMUSCULAR | 0 refills | Status: DC

## 2021-03-25 NOTE — Progress Notes (Signed)
03/25/2021                     Endocrinology follow-up note   Subjective:    Patient ID: Jeff Stewart, male    DOB: 1955/05/20, PCP Dettinger, Fransisca Kaufmann, MD   Past Medical History:  Diagnosis Date   Anxiety    Carpal tunnel syndrome, bilateral    Cataract    removed years ago   Chronic back pain    internal morphine pump   DDD (degenerative disc disease)    neck, lumbar   Depression    Dyslipidemia    diet controlled   GERD (gastroesophageal reflux disease)    past hx- had nissen fundiplication    Hypertension    borderline   Sleep apnea    wears C-PAP   Status post insertion of spinal cord stimulator    Past Surgical History:  Procedure Laterality Date   BACK SURGERY     x 6   CARPAL TUNNEL RELEASE     bilateral   COLONOSCOPY     ELBOW SURGERY     INGUINAL HERNIA REPAIR Right    INTERNAL MORPHINE PUMP      KNEE ARTHROSCOPY     NASAL SEPTUM SURGERY     x 6   neck fusion      NISSEN FUNDOPLICATION     SKIN CANCER EXCISION     SPINAL CORD STIMULATOR INSERTION     STOMACH SURGERY     Nissen Fundiplication   thumb surgery     TOTAL KNEE ARTHROPLASTY Left 04/24/2018   Procedure: TOTAL KNEE ARTHROPLASTY;  Surgeon: Renette Butters, MD;  Location: [facility];  Service: Orthopedics;  Laterality: Left;   UPPER GASTROINTESTINAL ENDOSCOPY     Social History   Socioeconomic History   Marital status: Married    Spouse name: Not on file   Number of children: 3   Years of education: Not on file   Highest education level: Not on file  Occupational History   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  Tobacco Use   Smoking status: Never   Smokeless tobacco: Never  Vaping Use   Vaping Use: Never used  Substance and Sexual Activity   Alcohol use: No   Drug use: No   Sexual activity: Not Currently    Birth control/protection: Post-menopausal    Comment: married for 1972  Other Topics Concern   Not on file  Social History Narrative   Lives with wife     Social Determinants of Health   Financial Resource Strain: Not on file  Food Insecurity: Not on file  Transportation Needs: Not on file  Physical Activity: Not on file  Stress: Not on file  Social Connections: Not on file   Outpatient Encounter Medications as of 03/25/2021  Medication Sig   acetaminophen (TYLENOL) 500 MG tablet Take 500 mg by mouth every 6 (six) hours as needed.   cetirizine (ZYRTEC) 10 MG tablet Take 1 tablet (10 mg total) by mouth daily.   fluticasone (FLONASE) 50 MCG/ACT nasal spray Place 2 sprays into both nostrils daily.   furosemide (LASIX) 40 MG tablet Take 1 tablet (40 mg total) by mouth every morning.   hydrochlorothiazide (HYDRODIURIL) 25 MG tablet Take 1 tablet (25 mg total) by mouth daily.   losartan (COZAAR) 50 MG tablet Take 1 tablet (50 mg total) by mouth 2 (two) times daily.   omeprazole (PRILOSEC) 20 MG capsule Take 1 capsule 2  times daily before a meal.   sertraline (ZOLOFT) 100 MG tablet Take 1 tablet (100 mg total) by mouth daily.   Syringe/Needle, Disp, (SYRINGE 3CC/21GX1-1/4") 21G X 1-1/4" 3 ML MISC 1 each by Does not apply route once a week.   testosterone cypionate (DEPOTESTOSTERONE CYPIONATE) 200 MG/ML injection INJECT 0.5 MLS INTRAMUSCULARY ONCE A WEEK   Turmeric 500 MG CAPS Take by mouth.   [DISCONTINUED] testosterone cypionate (DEPOTESTOSTERONE CYPIONATE) 200 MG/ML injection INJECT 0.5 MLS INTRAMUSCULARY ONCE A WEEK   No facility-administered encounter medications on file as of 03/25/2021.   ALLERGIES: Allergies  Allergen Reactions   Nalbuphine Nausea And Vomiting, Rash and Shortness Of Breath   Nubain [Nalbuphine Hcl] Rash   VACCINATION STATUS: Immunization History  Administered Date(s) Administered   Fluad Quad(high Dose 65+) 12/21/2018, 12/22/2020   Influenza Split 01/19/2014   Influenza,inj,Quad PF,6+ Mos 01/14/2015, 02/02/2016, 02/08/2018   Influenza-Unspecified 02/19/2014, 02/24/2020   Moderna Sars-Covid-2 Vaccination  05/12/2019, 05/23/2019, 03/10/2020   Pneumococcal Conjugate-13 08/09/2019   Pneumococcal Polysaccharide-23 04/23/2020   Tdap 12/11/2014   Zoster Recombinat (Shingrix) 04/23/2020, 10/08/2020    HPI Jeff Stewart is a 66 year old gentleman with a medical history as above.  He is being seen in follow-up for hypogonadism on testosterone replacement therapy.   -He remains on testosterone cypionate 100 mg IM every 7 days.  His previsit labs show total testosterone of 325.  He continues to feel better.  He has no new complaints.  He underwent hand surgery for arthritic changes.    -He reports better energy level, better mobility. He was known to have low testosterone for at least since age 48.  His previsit labs show total testosterone of 340-remaining stable.  No evidence of adverse effects from testosterone treatment.  He wishes to be continued on testosterone placement therapy. He fathers 3 grown children. He denies history of head injury , has had nasal septal surgery multiple times. He denies injury to the testicles, exposure to chemotherapy, exposure to radiation to the genitals. However, due to chronic back injury and surgery he has exposure to heavy opioid therapy for pain control. He is currently on morphine pump. He has been off and  on  opioids for pain control for the last 10-15 years.  He struggles with sleep apnea, even changing CPAP machines did not help much.  Review of Systems Limited as above.  Objective:    BP 136/88    Pulse 72    Ht 6\' 1"  (1.854 m)    Wt 264 lb 1.6 oz (119.8 kg)    BMI 34.84 kg/m   Wt Readings from Last 3 Encounters:  03/25/21 264 lb 1.6 oz (119.8 kg)  11/12/20 257 lb 3.2 oz (116.7 kg)  10/08/20 258 lb (117 kg)      From previous exam.  Genitourinary system: He has bilaterally shrunk testicles, right testes 12 mL, left testes 15 mL. No scrotal mass, no varicocele,  no inguinal lymphadenopathy.    Recent Results (from the past 2160 hour(s))  Novel  Coronavirus, NAA ([facility])     Status: None   Collection Time: 01/21/21 11:06 AM   Specimen: Nasopharyngeal(NP) swabs in vial transport medium  Result Value Ref Range   SARS-CoV-2, NAA Not Detected Not Detected    Comment: This nucleic acid amplification test was developed and its performance characteristics determined by [employer]. Nucleic acid amplification tests include RT-PCR and TMA. This test has not been FDA cleared or approved. This test has been authorized by FDA under an  Emergency Use Authorization (EUA). This test is only authorized for the duration of time the declaration that circumstances exist justifying the authorization of the emergency use of in vitro diagnostic tests for detection of SARS-CoV-2 virus and/or diagnosis of COVID-19 infection under section 564(b)(1) of the Act, 21 U.S.C. 962EZM-6(Q) (1), unless the authorization is terminated or revoked sooner. When diagnostic testing is negative, the possibility of a false negative result should be considered in the context of a patient's recent exposures and the presence of clinical signs and symptoms consistent with COVID-19. An individual without symptoms of COVID-19 and who is not shedding SARS-CoV-2 virus wo uld expect to have a negative (not detected) result in this assay.   SARS-COV-2, NAA 2 DAY TAT     Status: None   Collection Time: 01/21/21 11:06 AM  Result Value Ref Range   SARS-CoV-2, NAA 2 DAY TAT Performed   Testosterone, Free, Total, SHBG     Status: None   Collection Time: 03/16/21  8:20 AM  Result Value Ref Range   Testosterone 325 264 - 916 ng/dL    Comment: Adult male reference interval is based on a population of healthy nonobese males (BMI <30) between 73 and 5 years old. [place], [place] [phone]. PMID: 81275170.    Testosterone, Free 8.4 6.6 - 18.1 pg/mL   Sex Hormone Binding 20.4 19.3 - 76.4 nmol/L     Assessment & Plan:   1. Hypogonadism  2.  Obesity/sleep  apnea  -Testosterone replacement helped improve his testosterone from 74 ->286 -> 782 -> 509 > 978 > 659-> 437 >494> 327-> 506-> 640 -> 673 -> 529 -> 446 -> 340 -> 488 ->325   No adverse events noted. 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   -It is possible that it is multifactorial including exposure to heavy opioids, multiple back surgeries, prior inguinal surgery, or idiopathic.  -Prolactin level is favorable at 6.1, he will not need pituitary/sella imaging for now.  -He has benefited from testosterone replacement therapy and he wishes to be continued on treatment.     -He is advised to continue testosterone 100 mg IM weekly for target testosterone level 300-600 mcg/dL.   -He we will return in 51-months with total and free testosterone measurements.   Regarding his sleep apnea: He would benefit the most from weight loss. - he acknowledges that there is a room for improvement in his food and drink choices. - Suggestion is made for him to avoid simple carbohydrates  from his diet including Cakes, Sweet Desserts, Ice Cream, Soda (diet and regular), Sweet Tea, Candies, Chips, Cookies, Store Bought Juices, Alcohol in Excess of  1-2 drinks a day, Artificial Sweeteners,  Coffee Creamer, and "Sugar-free" Products, Lemonade. This will help patient to have more stable blood glucose profile and potentially avoid unintended weight gain.  - I advised patient to maintain close follow up with Dettinger, Fransisca Kaufmann, MD for primary care needs.   I spent 20 minutes in the care of the patient today including review of labs from Thyroid Function, CMP, and other relevant labs ; imaging/biopsy records (current and previous including abstractions from other facilities); face-to-face time discussing  his lab results and symptoms, medications doses, his options of short and long term treatment based on the latest standards of care /  guidelines;   and documenting the encounter.  Levonne Spiller Pringle  participated in the discussions, expressed understanding, and voiced agreement with  the above plans.  All questions were answered to his satisfaction. he is encouraged to contact clinic should he have any questions or concerns prior to his return visit.  Follow up plan: Return in about 4 months (around 07/23/2021) for Fasting Labs  in AM B4 8.  Glade Lloyd, MD Phone: [phone]  Fax: [phone]   This note was partially dictated with voice recognition software. Similar sounding words can be transcribed inadequately or may not  be corrected upon review.  03/25/2021, 12:18 PM

## 2021-03-29 DIAGNOSIS — M25641 Stiffness of right hand, not elsewhere classified: Secondary | ICD-10-CM | POA: Diagnosis not present

## 2021-03-29 DIAGNOSIS — M25531 Pain in right wrist: Secondary | ICD-10-CM | POA: Diagnosis not present

## 2021-03-29 DIAGNOSIS — M25541 Pain in joints of right hand: Secondary | ICD-10-CM | POA: Diagnosis not present

## 2021-03-29 DIAGNOSIS — M13841 Other specified arthritis, right hand: Secondary | ICD-10-CM | POA: Diagnosis not present

## 2021-03-29 DIAGNOSIS — M25441 Effusion, right hand: Secondary | ICD-10-CM | POA: Diagnosis not present

## 2021-03-29 DIAGNOSIS — R531 Weakness: Secondary | ICD-10-CM | POA: Diagnosis not present

## 2021-03-30 DIAGNOSIS — M25541 Pain in joints of right hand: Secondary | ICD-10-CM | POA: Diagnosis not present

## 2021-03-30 DIAGNOSIS — R531 Weakness: Secondary | ICD-10-CM | POA: Diagnosis not present

## 2021-03-30 DIAGNOSIS — M25531 Pain in right wrist: Secondary | ICD-10-CM | POA: Diagnosis not present

## 2021-03-30 DIAGNOSIS — M13841 Other specified arthritis, right hand: Secondary | ICD-10-CM | POA: Diagnosis not present

## 2021-03-30 DIAGNOSIS — M25641 Stiffness of right hand, not elsewhere classified: Secondary | ICD-10-CM | POA: Diagnosis not present

## 2021-03-30 DIAGNOSIS — M25441 Effusion, right hand: Secondary | ICD-10-CM | POA: Diagnosis not present

## 2021-04-07 DIAGNOSIS — M25541 Pain in joints of right hand: Secondary | ICD-10-CM | POA: Diagnosis not present

## 2021-04-07 DIAGNOSIS — M13841 Other specified arthritis, right hand: Secondary | ICD-10-CM | POA: Diagnosis not present

## 2021-04-07 DIAGNOSIS — M25641 Stiffness of right hand, not elsewhere classified: Secondary | ICD-10-CM | POA: Diagnosis not present

## 2021-04-07 DIAGNOSIS — M25441 Effusion, right hand: Secondary | ICD-10-CM | POA: Diagnosis not present

## 2021-04-07 DIAGNOSIS — M25531 Pain in right wrist: Secondary | ICD-10-CM | POA: Diagnosis not present

## 2021-04-07 DIAGNOSIS — R531 Weakness: Secondary | ICD-10-CM | POA: Diagnosis not present

## 2021-04-12 ENCOUNTER — Encounter: Payer: Self-pay | Admitting: Family Medicine

## 2021-04-12 ENCOUNTER — Ambulatory Visit (INDEPENDENT_AMBULATORY_CARE_PROVIDER_SITE_OTHER): Payer: Medicare Other | Admitting: Family Medicine

## 2021-04-12 VITALS — BP 122/75 | HR 64 | Ht 73.0 in | Wt 260.0 lb

## 2021-04-12 DIAGNOSIS — E291 Testicular hypofunction: Secondary | ICD-10-CM

## 2021-04-12 DIAGNOSIS — I10 Essential (primary) hypertension: Secondary | ICD-10-CM

## 2021-04-12 DIAGNOSIS — R0981 Nasal congestion: Secondary | ICD-10-CM | POA: Diagnosis not present

## 2021-04-12 DIAGNOSIS — F32A Depression, unspecified: Secondary | ICD-10-CM

## 2021-04-12 DIAGNOSIS — F419 Anxiety disorder, unspecified: Secondary | ICD-10-CM

## 2021-04-12 MED ORDER — SERTRALINE HCL 100 MG PO TABS: 150.0000 mg | ORAL_TABLET | Freq: Every day | ORAL | 3 refills | Status: DC

## 2021-04-12 MED ORDER — FUROSEMIDE 40 MG PO TABS: 40.0000 mg | ORAL_TABLET | Freq: Every morning | ORAL | 3 refills | Status: DC

## 2021-04-12 MED ORDER — CETIRIZINE HCL 10 MG PO TABS: 10.0000 mg | ORAL_TABLET | Freq: Every day | ORAL | 3 refills | Status: DC

## 2021-04-12 MED ORDER — HYDROCHLOROTHIAZIDE 25 MG PO TABS: 25.0000 mg | ORAL_TABLET | Freq: Every day | ORAL | 3 refills | Status: DC

## 2021-04-12 MED ORDER — LOSARTAN POTASSIUM 50 MG PO TABS: 50.0000 mg | ORAL_TABLET | Freq: Two times a day (BID) | ORAL | 3 refills | Status: DC

## 2021-04-12 NOTE — Progress Notes (Signed)
BP 122/75    Pulse 64    Ht 6' 1" (1.854 m)    Wt 260 lb (117.9 kg)    SpO2 97%    BMI 34.30 kg/m    Subjective:   Patient ID: Jeff Stewart, male    DOB: Dec 17, 1955, 66 y.o.   MRN: [id number]  HPI: Jeff Stewart is a 66 y.o. male presenting on 04/12/2021 for Medical Management of Chronic Issues, Hypertension, Gastroesophageal Reflux, and Anxiety   HPI Hypertension Patient is currently on losartan and hydrochlorothiazide and furosemide, and their blood pressure today is 122/75. Patient denies any lightheadedness or dizziness. Patient denies headaches, blurred vision, chest pains, shortness of breath, or weakness. Denies any side effects from medication and is content with current medication.   Testosterone deficiency Patient is coming in for recheck of testosterone deficiency.  Seems like his been stable, will recheck blood counts.  He is currently taking testosterone injections and denies any major issues with it.  Anxiety depression recheck Patient is coming in for anxiety depression recheck.  Currently takes Zoloft 100 mg daily.  He says its not working as well as it has been for and he is having a little more anxiety and anxiousness.  He would like to see about either increasing or changing. Depression screen [facility] 2/9 04/12/2021 10/08/2020 09/08/2020 07/07/2020 08/09/2019  Decreased Interest 1 1 0 0 0  Down, Depressed, Hopeless 0 0 1 3 0  PHQ - 2 Score _0 0  Altered sleeping _1 - 3  Tired, decreased energy _2 - 3  Change in appetite 1 0 0 - 0  Feeling bad or failure about yourself  0 0 0 - 0  Trouble concentrating 1 1 0 - 0  Moving slowly or fidgety/restless 1 2 0 - 0  Suicidal thoughts 0 0 0 - 0  PHQ-9 Score _3 - 6  Difficult doing work/chores - - - - Not difficult at all  Some recent data might be hidden     Relevant past medical, surgical, family and social history reviewed and updated as indicated. Interim medical history since our last visit  reviewed. Allergies and medications reviewed and updated.  Review of Systems  Constitutional:  Negative for chills and fever.  Eyes:  Negative for visual disturbance.  Respiratory:  Negative for shortness of breath and wheezing.   Cardiovascular:  Negative for chest pain and leg swelling.  Musculoskeletal:  Positive for arthralgias. Negative for back pain and gait problem.  Skin:  Negative for rash.  Neurological:  Negative for dizziness, weakness and light-headedness.  All other systems reviewed and are negative.  Per HPI unless specifically indicated above   Allergies as of 04/12/2021       Reactions   Nalbuphine Nausea And Vomiting, Rash, Shortness Of Breath   Nubain [nalbuphine Hcl] Rash        Medication List        Accurate as of April 12, 2021  8:21 AM. If you have any questions, ask your nurse or doctor.          acetaminophen 500 MG tablet Commonly known as: TYLENOL Take 500 mg by mouth every 6 (six) hours as needed.   cetirizine 10 MG tablet Commonly known as: ZYRTEC Take 1 tablet (10 mg total) by mouth daily.   fluticasone 50 MCG/ACT nasal spray Commonly known as: FLONASE Place 2 sprays into both nostrils daily.   furosemide 40 MG  tablet Commonly known as: LASIX Take 1 tablet (40 mg total) by mouth every morning.   hydrochlorothiazide 25 MG tablet Commonly known as: HYDRODIURIL Take 1 tablet (25 mg total) by mouth daily.   losartan 50 MG tablet Commonly known as: COZAAR Take 1 tablet (50 mg total) by mouth 2 (two) times daily.   omeprazole 20 MG capsule Commonly known as: PRILOSEC Take 1 capsule 2 times daily before a meal.   sertraline 100 MG tablet Commonly known as: ZOLOFT Take 1.5 tablets (150 mg total) by mouth daily. What changed: how much to take Changed by: Fransisca Kaufmann Dettinger, MD   SYRINGE 3CC/21GX1-1/4" 21G X 1-1/4" 3 ML Misc 1 each by Does not apply route once a week.   testosterone cypionate 200 MG/ML injection Commonly  known as: DEPOTESTOSTERONE CYPIONATE INJECT 0.5 MLS INTRAMUSCULARY ONCE A WEEK   Turmeric 500 MG Caps Take by mouth.         Objective:   BP 122/75    Pulse 64    Ht 6' 1" (1.854 m)    Wt 260 lb (117.9 kg)    SpO2 97%    BMI 34.30 kg/m   Wt Readings from Last 3 Encounters:  04/12/21 260 lb (117.9 kg)  03/25/21 264 lb 1.6 oz (119.8 kg)  11/12/20 257 lb 3.2 oz (116.7 kg)    Physical Exam Vitals and nursing note reviewed.  Constitutional:      General: He is not in acute distress.    Appearance: He is well-developed. He is not diaphoretic.  Eyes:     General: No scleral icterus.    Conjunctiva/sclera: Conjunctivae normal.  Neck:     Thyroid: No thyromegaly.  Cardiovascular:     Rate and Rhythm: Normal rate and regular rhythm.     Heart sounds: Normal heart sounds. No murmur heard. Pulmonary:     Effort: Pulmonary effort is normal. No respiratory distress.     Breath sounds: Normal breath sounds. No wheezing.  Abdominal:     General: Abdomen is flat. Bowel sounds are normal. There is no distension.     Tenderness: There is no abdominal tenderness. There is no guarding or rebound.     Comments: Morphine pump in the subcutaneous area and right upper quadrant  Musculoskeletal:        General: Normal range of motion.     Cervical back: Neck supple.  Lymphadenopathy:     Cervical: No cervical adenopathy.  Skin:    General: Skin is warm and dry.     Findings: No rash.  Neurological:     Mental Status: He is alert and oriented to person, place, and time.     Coordination: Coordination normal.  Psychiatric:        Behavior: Behavior normal.      Assessment & Plan:   Problem List Items Addressed This Visit       Cardiovascular and Mediastinum   Essential hypertension, benign   Relevant Medications   furosemide (LASIX) 40 MG tablet   hydrochlorothiazide (HYDRODIURIL) 25 MG tablet   losartan (COZAAR) 50 MG tablet   Other Relevant Orders   CBC with  Differential/Platelet   CMP14+EGFR   Lipid panel     Endocrine   Hypogonadism in male - Primary   Relevant Orders   Testosterone,Free and Total     Other   Anxiety and depression   Relevant Medications   sertraline (ZOLOFT) 100 MG tablet   Other Relevant Orders   CBC with  Differential/Platelet   CMP14+EGFR   Other Visit Diagnoses     Sinus congestion       Relevant Medications   cetirizine (ZYRTEC) 10 MG tablet     Will increase Zoloft to 150 mg.  Otherwise continue current medicine. Will check blood work today.  No other changes Follow up plan: Return in about 6 months (around 10/10/2021), or if symptoms worsen or fail to improve, for Hypertension anxiety and depression.  Counseling provided for all of the vaccine components Orders Placed This Encounter  Procedures   CBC with Differential/Platelet   CMP14+EGFR   Lipid panel   Testosterone,Free and Total    Caryl Pina, MD [facility] 04/12/2021, 8:21 AM

## 2021-04-13 DIAGNOSIS — M25541 Pain in joints of right hand: Secondary | ICD-10-CM | POA: Diagnosis not present

## 2021-04-13 DIAGNOSIS — R531 Weakness: Secondary | ICD-10-CM | POA: Diagnosis not present

## 2021-04-13 DIAGNOSIS — M25441 Effusion, right hand: Secondary | ICD-10-CM | POA: Diagnosis not present

## 2021-04-13 DIAGNOSIS — M13841 Other specified arthritis, right hand: Secondary | ICD-10-CM | POA: Diagnosis not present

## 2021-04-13 DIAGNOSIS — M25531 Pain in right wrist: Secondary | ICD-10-CM | POA: Diagnosis not present

## 2021-04-13 DIAGNOSIS — M25641 Stiffness of right hand, not elsewhere classified: Secondary | ICD-10-CM | POA: Diagnosis not present

## 2021-04-16 LAB — CBC WITH DIFFERENTIAL/PLATELET
Basophils Absolute: 0 10*3/uL (ref 0.0–0.2)
Basos: 1 %
EOS (ABSOLUTE): 0.2 10*3/uL (ref 0.0–0.4)
Eos: 4 %
Hematocrit: 42.3 % (ref 37.5–51.0)
Hemoglobin: 13.8 g/dL (ref 13.0–17.7)
Immature Grans (Abs): 0 10*3/uL (ref 0.0–0.1)
Immature Granulocytes: 0 %
Lymphocytes Absolute: 1.3 10*3/uL (ref 0.7–3.1)
Lymphs: 32 %
MCH: 28.3 pg (ref 26.6–33.0)
MCHC: 32.6 g/dL (ref 31.5–35.7)
MCV: 87 fL (ref 79–97)
Monocytes Absolute: 0.4 10*3/uL (ref 0.1–0.9)
Monocytes: 11 %
Neutrophils Absolute: 2.2 10*3/uL (ref 1.4–7.0)
Neutrophils: 52 %
Platelets: 158 10*3/uL (ref 150–450)
RBC: 4.87 x10E6/uL (ref 4.14–5.80)
RDW: 14.5 % (ref 11.6–15.4)
WBC: 4.2 10*3/uL (ref 3.4–10.8)

## 2021-04-16 LAB — CMP14+EGFR
ALT: 23 IU/L (ref 0–44)
AST: 23 IU/L (ref 0–40)
Albumin/Globulin Ratio: 1.8 (ref 1.2–2.2)
Albumin: 4.2 g/dL (ref 3.8–4.8)
Alkaline Phosphatase: 88 IU/L (ref 44–121)
BUN/Creatinine Ratio: 21 (ref 10–24)
BUN: 22 mg/dL (ref 8–27)
Bilirubin Total: 0.4 mg/dL (ref 0.0–1.2)
CO2: 31 mmol/L — ABNORMAL HIGH (ref 20–29)
Calcium: 9.3 mg/dL (ref 8.6–10.2)
Chloride: 100 mmol/L (ref 96–106)
Creatinine, Ser: 1.03 mg/dL (ref 0.76–1.27)
Globulin, Total: 2.4 g/dL (ref 1.5–4.5)
Glucose: 93 mg/dL (ref 70–99)
Potassium: 4.1 mmol/L (ref 3.5–5.2)
Sodium: 142 mmol/L (ref 134–144)
Total Protein: 6.6 g/dL (ref 6.0–8.5)
eGFR: 80 mL/min/{1.73_m2} (ref >59)

## 2021-04-16 LAB — LIPID PANEL
Chol/HDL Ratio: 3.9 ratio (ref 0.0–5.0)
Cholesterol, Total: 179 mg/dL (ref 100–199)
HDL: 46 mg/dL (ref >39)
LDL Chol Calc (NIH): 111 mg/dL — ABNORMAL HIGH (ref 0–99)
Triglycerides: 121 mg/dL (ref 0–149)
VLDL Cholesterol Cal: 22 mg/dL (ref 5–40)

## 2021-04-16 LAB — TESTOSTERONE,FREE AND TOTAL
Testosterone, Free: 1.2 pg/mL — ABNORMAL LOW (ref 6.6–18.1)
Testosterone: 27 ng/dL — ABNORMAL LOW (ref 264–916)

## 2021-04-20 DIAGNOSIS — Z85828 Personal history of other malignant neoplasm of skin: Secondary | ICD-10-CM | POA: Diagnosis not present

## 2021-04-20 DIAGNOSIS — L814 Other melanin hyperpigmentation: Secondary | ICD-10-CM | POA: Diagnosis not present

## 2021-04-20 DIAGNOSIS — K130 Diseases of lips: Secondary | ICD-10-CM | POA: Diagnosis not present

## 2021-04-20 DIAGNOSIS — L821 Other seborrheic keratosis: Secondary | ICD-10-CM | POA: Diagnosis not present

## 2021-04-20 DIAGNOSIS — L570 Actinic keratosis: Secondary | ICD-10-CM | POA: Diagnosis not present

## 2021-04-20 DIAGNOSIS — Z08 Encounter for follow-up examination after completed treatment for malignant neoplasm: Secondary | ICD-10-CM | POA: Diagnosis not present

## 2021-04-20 DIAGNOSIS — D225 Melanocytic nevi of trunk: Secondary | ICD-10-CM | POA: Diagnosis not present

## 2021-04-22 DIAGNOSIS — M13841 Other specified arthritis, right hand: Secondary | ICD-10-CM | POA: Diagnosis not present

## 2021-04-29 DIAGNOSIS — M961 Postlaminectomy syndrome, not elsewhere classified: Secondary | ICD-10-CM | POA: Diagnosis not present

## 2021-04-29 DIAGNOSIS — M5030 Other cervical disc degeneration, unspecified cervical region: Secondary | ICD-10-CM | POA: Diagnosis not present

## 2021-04-29 DIAGNOSIS — M5136 Other intervertebral disc degeneration, lumbar region: Secondary | ICD-10-CM | POA: Diagnosis not present

## 2021-04-29 DIAGNOSIS — G894 Chronic pain syndrome: Secondary | ICD-10-CM | POA: Diagnosis not present

## 2021-05-20 DIAGNOSIS — Z4789 Encounter for other orthopedic aftercare: Secondary | ICD-10-CM | POA: Diagnosis not present

## 2021-05-20 DIAGNOSIS — M79645 Pain in left finger(s): Secondary | ICD-10-CM | POA: Diagnosis not present

## 2021-05-20 DIAGNOSIS — M13841 Other specified arthritis, right hand: Secondary | ICD-10-CM | POA: Diagnosis not present

## 2021-05-20 DIAGNOSIS — M25562 Pain in left knee: Secondary | ICD-10-CM | POA: Diagnosis not present

## 2021-06-17 ENCOUNTER — Ambulatory Visit (INDEPENDENT_AMBULATORY_CARE_PROVIDER_SITE_OTHER): Payer: Medicare Other | Admitting: Nurse Practitioner

## 2021-06-17 ENCOUNTER — Encounter: Payer: Self-pay | Admitting: Nurse Practitioner

## 2021-06-17 VITALS — BP 116/73 | HR 73 | Temp 99.4°F | Resp 20 | Ht 73.0 in | Wt 261.0 lb

## 2021-06-17 DIAGNOSIS — J0100 Acute maxillary sinusitis, unspecified: Secondary | ICD-10-CM | POA: Diagnosis not present

## 2021-06-17 MED ORDER — AZITHROMYCIN 250 MG PO TABS: ORAL_TABLET | ORAL | 0 refills | Status: DC

## 2021-06-17 MED ORDER — BENZONATATE 100 MG PO CAPS: 100.0000 mg | ORAL_CAPSULE | Freq: Three times a day (TID) | ORAL | 0 refills | Status: DC | PRN

## 2021-06-17 NOTE — Progress Notes (Signed)
? ?Subjective:  ? ? Patient ID: Jeff Stewart, male    DOB: [birth year], 66 y.o.   MRN: [id number] ? ?Chief Complaint: Cough and Nasal Congestion ? ? ?Cough ?This is a new problem. The current episode started in the past 7 days. The problem has been gradually worsening. The problem occurs constantly. The cough is Productive of purulent sputum. Associated symptoms include ear congestion, headaches, myalgias, nasal congestion, rhinorrhea, a sore throat and shortness of breath. Pertinent negatives include no chills or fever. Nothing aggravates the symptoms. He has tried OTC cough suppressant for the symptoms. The treatment provided mild relief.  ? ? ? ? ?Review of Systems  ?Constitutional:  Negative for chills, fatigue and fever.  ?HENT:  Positive for rhinorrhea and sore throat.   ?Respiratory:  Positive for cough and shortness of breath.   ?Musculoskeletal:  Positive for myalgias.  ?Neurological:  Positive for headaches.  ? ?   ?Objective:  ? Physical Exam ?Vitals reviewed.  ?Constitutional:   ?   Appearance: Normal appearance.  ?HENT:  ?   Right Ear: Tympanic membrane normal.  ?   Left Ear: Tympanic membrane normal.  ?   Nose: Congestion and rhinorrhea present.  ?   Right Sinus: Maxillary sinus tenderness present. No frontal sinus tenderness.  ?   Left Sinus: Maxillary sinus tenderness present. No frontal sinus tenderness.  ?   Mouth/Throat:  ?   Mouth: Mucous membranes are moist.  ?Eyes:  ?   Extraocular Movements: Extraocular movements intact.  ?   Pupils: Pupils are equal, round, and reactive to light.  ?Musculoskeletal:  ?   Cervical back: Normal range of motion.  ?Neurological:  ?   Mental Status: He is alert.  ? ? ?BP 116/73   Pulse 73   Temp 99.4 ?F (37.4 ?C) (Temporal)   Resp 20   Ht '6\' 1"'$  (1.854 m)   Wt 261 lb (118.4 kg)   SpO2 96%   BMI 34.43 kg/m?  ? ? ? ?   ?Assessment & Plan:  ?Jeff Stewart in today with chief complaint of Cough and Nasal Congestion ? ? ?1. Acute non-recurrent maxillary  sinusitis ?1. Take meds as prescribed ?2. Use a cool mist humidifier especially during the winter months and when heat has been humid. ?3. Use saline nose sprays frequently ?4. Saline irrigations of the nose can be very helpful if done frequently. ? * 4X daily for 1 week* ? * Use of a nettie pot can be helpful with this. Follow directions with this* ?5. Drink plenty of fluids ?6. Keep thermostat turn down low ?7.For any cough or congestion- tessalon perles ?8. For fever or aces or pains- take tylenol or ibuprofen appropriate for age and weight. ? * for fevers greater than 101 orally you may alternate ibuprofen and tylenol every  3 hours. ?  ?Meds ordered this encounter  ?Medications  ? azithromycin (ZITHROMAX Z-PAK) 250 MG tablet  ?  Sig: As directed  ?  Dispense:  6 tablet  ?  Refill:  0  ?  Order Specific Question:   Supervising Provider  ?  Answer:   Caryl Pina A [[id number]]  ? benzonatate (TESSALON PERLES) 100 MG capsule  ?  Sig: Take 1 capsule (100 mg total) by mouth 3 (three) times daily as needed for cough.  ?  Dispense:  20 capsule  ?  Refill:  0  ?  Order Specific Question:   Supervising Provider  ?  Answer:   DETTINGER,  JOSHUA A [[id number]]  ? ? ? ? ? ?The above assessment and management plan was discussed with the patient. The patient verbalized understanding of and has agreed to the management plan. Patient is aware to call the clinic if symptoms persist or worsen. Patient is aware when to return to the clinic for a follow-up visit. Patient educated on when it is appropriate to go to the emergency department.  ? ?Mary-Margaret Hassell Done, FNP ? ? ? ?

## 2021-06-17 NOTE — Patient Instructions (Signed)

## 2021-06-30 DIAGNOSIS — M5030 Other cervical disc degeneration, unspecified cervical region: Secondary | ICD-10-CM | POA: Diagnosis not present

## 2021-06-30 DIAGNOSIS — M5136 Other intervertebral disc degeneration, lumbar region: Secondary | ICD-10-CM | POA: Diagnosis not present

## 2021-06-30 DIAGNOSIS — M961 Postlaminectomy syndrome, not elsewhere classified: Secondary | ICD-10-CM | POA: Diagnosis not present

## 2021-06-30 DIAGNOSIS — Z978 Presence of other specified devices: Secondary | ICD-10-CM | POA: Diagnosis not present

## 2021-07-16 ENCOUNTER — Other Ambulatory Visit: Payer: Medicare Other

## 2021-07-16 ENCOUNTER — Telehealth: Payer: Self-pay | Admitting: Family Medicine

## 2021-07-16 DIAGNOSIS — I10 Essential (primary) hypertension: Secondary | ICD-10-CM | POA: Diagnosis not present

## 2021-07-16 DIAGNOSIS — M549 Dorsalgia, unspecified: Secondary | ICD-10-CM | POA: Diagnosis not present

## 2021-07-16 DIAGNOSIS — G4733 Obstructive sleep apnea (adult) (pediatric): Secondary | ICD-10-CM | POA: Diagnosis not present

## 2021-07-16 NOTE — Telephone Encounter (Signed)
Left message for patient to call back and schedule Medicare Annual Wellness Visit (AWV) to be completed by video or phone.  ? ?Last AWV: 07/07/20 ? ?Please schedule at anytime with [place] ? ?45 minute appointment ? ?Any questions, please contact me at [phone]  ?  ?  ?

## 2021-07-18 LAB — PSA: Prostate Specific Ag, Serum: 1.2 ng/mL (ref 0.0–4.0)

## 2021-07-18 LAB — TESTOSTERONE, FREE, TOTAL, SHBG
Sex Hormone Binding: 20.2 nmol/L (ref 19.3–76.4)
Testosterone, Free: 18.4 pg/mL — ABNORMAL HIGH (ref 6.6–18.1)
Testosterone: 700 ng/dL (ref 264–916)

## 2021-07-18 LAB — CBC WITH DIFFERENTIAL/PLATELET
Basophils Absolute: 0 10*3/uL (ref 0.0–0.2)
Basos: 1 %
EOS (ABSOLUTE): 0.2 10*3/uL (ref 0.0–0.4)
Eos: 5 %
Hematocrit: 43.3 % (ref 37.5–51.0)
Hemoglobin: 14 g/dL (ref 13.0–17.7)
Immature Grans (Abs): 0 10*3/uL (ref 0.0–0.1)
Immature Granulocytes: 0 %
Lymphocytes Absolute: 1.4 10*3/uL (ref 0.7–3.1)
Lymphs: 34 %
MCH: 28.1 pg (ref 26.6–33.0)
MCHC: 32.3 g/dL (ref 31.5–35.7)
MCV: 87 fL (ref 79–97)
Monocytes Absolute: 0.5 10*3/uL (ref 0.1–0.9)
Monocytes: 11 %
Neutrophils Absolute: 2 10*3/uL (ref 1.4–7.0)
Neutrophils: 49 %
Platelets: 140 10*3/uL — ABNORMAL LOW (ref 150–450)
RBC: 4.99 x10E6/uL (ref 4.14–5.80)
RDW: 11.9 % (ref 11.6–15.4)
WBC: 4.1 10*3/uL (ref 3.4–10.8)

## 2021-07-18 LAB — COMPREHENSIVE METABOLIC PANEL
ALT: 17 IU/L (ref 0–44)
AST: 20 IU/L (ref 0–40)
Albumin/Globulin Ratio: 2.1 (ref 1.2–2.2)
Albumin: 4.6 g/dL (ref 3.8–4.8)
Alkaline Phosphatase: 91 IU/L (ref 44–121)
BUN/Creatinine Ratio: 20 (ref 10–24)
BUN: 19 mg/dL (ref 8–27)
Bilirubin Total: 0.5 mg/dL (ref 0.0–1.2)
CO2: 29 mmol/L (ref 20–29)
Calcium: 9 mg/dL (ref 8.6–10.2)
Chloride: 96 mmol/L (ref 96–106)
Creatinine, Ser: 0.97 mg/dL (ref 0.76–1.27)
Globulin, Total: 2.2 g/dL (ref 1.5–4.5)
Glucose: 86 mg/dL (ref 70–99)
Potassium: 4 mmol/L (ref 3.5–5.2)
Sodium: 138 mmol/L (ref 134–144)
Total Protein: 6.8 g/dL (ref 6.0–8.5)
eGFR: 85 mL/min/{1.73_m2} (ref >59)

## 2021-07-22 ENCOUNTER — Ambulatory Visit (INDEPENDENT_AMBULATORY_CARE_PROVIDER_SITE_OTHER): Payer: Medicare Other

## 2021-07-22 VITALS — Wt 261.0 lb

## 2021-07-22 DIAGNOSIS — Z0000 Encounter for general adult medical examination without abnormal findings: Secondary | ICD-10-CM | POA: Diagnosis not present

## 2021-07-22 NOTE — Patient Instructions (Addendum)
Jeff Stewart , ?Thank you for taking time to come for your Medicare Wellness Visit. I appreciate your ongoing commitment to your health goals. Please review the following plan we discussed and let me know if I can assist you in the future.  ? ?Screening recommendations/referrals: ?Colonoscopy: Done 01/08/2019 - Repeat in 10 years per Dr Fuller Plan ?Recommended yearly ophthalmology/optometry visit for glaucoma screening and checkup ?Recommended yearly dental visit for hygiene and checkup ? ?Vaccinations: ?Influenza vaccine: Done 12/22/2020 - Repeat annually  ?Pneumococcal vaccine: Done 08/09/2019 & 04/23/2020    ?Tdap vaccine: Done 12/11/2014 - Repeat in 10 years ?Shingles vaccine: Done 04/23/2020 & 10/08/2020    ?Covid-19: Done  05/12/2019, 05/23/2019, & 03/10/2020 ? ?Advanced directives: Please bring a copy of your health care power of attorney and living will to the office to be added to your chart at your convenience.  ? ?Conditions/risks identified: Aim for 30 minutes of exercise or brisk walking, 6-8 glasses of water, and 5 servings of fruits and vegetables each day. Make an appointment with sleep apnea doctor to get a new CPAP or discuss alternatives. ? ?Next appointment: Follow up in one year for your annual wellness visit.  ? ?Preventive Care 68 Years and Older, Male ? ?Preventive care refers to lifestyle choices and visits with your health care provider that can promote health and wellness. ?What does preventive care include? ?A yearly physical exam. This is also called an annual well check. ?Dental exams once or twice a year. ?Routine eye exams. Ask your health care provider how often you should have your eyes checked. ?Personal lifestyle choices, including: ?Daily care of your teeth and gums. ?Regular physical activity. ?Eating a healthy diet. ?Avoiding tobacco and drug use. ?Limiting alcohol use. ?Practicing safe sex. ?Taking low doses of aspirin every day. ?Taking vitamin and mineral supplements as recommended by your  health care provider. ?What happens during an annual well check? ?The services and screenings done by your health care provider during your annual well check will depend on your age, overall health, lifestyle risk factors, and family history of disease. ?Counseling  ?Your health care provider may ask you questions about your: ?Alcohol use. ?Tobacco use. ?Drug use. ?Emotional well-being. ?Home and relationship well-being. ?Sexual activity. ?Eating habits. ?History of falls. ?Memory and ability to understand (cognition). ?Work and work Statistician. ?Screening  ?You may have the following tests or measurements: ?Height, weight, and BMI. ?Blood pressure. ?Lipid and cholesterol levels. These may be checked every 5 years, or more frequently if you are over 40 years old. ?Skin check. ?Lung cancer screening. You may have this screening every year starting at age 64 if you have a 30-pack-year history of smoking and currently smoke or have quit within the past 15 years. ?Fecal occult blood test (FOBT) of the stool. You may have this test every year starting at age 57. ?Flexible sigmoidoscopy or colonoscopy. You may have a sigmoidoscopy every 5 years or a colonoscopy every 10 years starting at age 7. ?Prostate cancer screening. Recommendations will vary depending on your family history and other risks. ?Hepatitis C blood test. ?Hepatitis B blood test. ?Sexually transmitted disease (STD) testing. ?Diabetes screening. This is done by checking your blood sugar (glucose) after you have not eaten for a while (fasting). You may have this done every 1-3 years. ?Abdominal aortic aneurysm (AAA) screening. You may need this if you are a current or former smoker. ?Osteoporosis. You may be screened starting at age 54 if you are at high risk. ?Talk with  your health care provider about your test results, treatment options, and if necessary, the need for more tests. ?Vaccines  ?Your health care provider may recommend certain vaccines, such  as: ?Influenza vaccine. This is recommended every year. ?Tetanus, diphtheria, and acellular pertussis (Tdap, Td) vaccine. You may need a Td booster every 10 years. ?Zoster vaccine. You may need this after age 39. ?Pneumococcal 13-valent conjugate (PCV13) vaccine. One dose is recommended after age 59. ?Pneumococcal polysaccharide (PPSV23) vaccine. One dose is recommended after age 42. ?Talk to your health care provider about which screenings and vaccines you need and how often you need them. ?This information is not intended to replace advice given to you by your health care provider. Make sure you discuss any questions you have with your health care provider. ?Document Released: 04/03/2015 Document Revised: 11/25/2015 Document Reviewed: 01/06/2015 ?Elsevier Interactive Patient Education ? 2017 [place]. ? ?Fall Prevention in the Home ?Falls can cause injuries. They can happen to people of all ages. There are many things you can do to make your home safe and to help prevent falls. ?What can I do on the outside of my home? ?Regularly fix the edges of walkways and driveways and fix any cracks. ?Remove anything that might make you trip as you walk through a door, such as a raised step or threshold. ?Trim any bushes or trees on the path to your home. ?Use bright outdoor lighting. ?Clear any walking paths of anything that might make someone trip, such as rocks or tools. ?Regularly check to see if handrails are loose or broken. Make sure that both sides of any steps have handrails. ?Any raised decks and porches should have guardrails on the edges. ?Have any leaves, snow, or ice cleared regularly. ?Use sand or salt on walking paths during winter. ?Clean up any spills in your garage right away. This includes oil or grease spills. ?What can I do in the bathroom? ?Use night lights. ?Install grab bars by the toilet and in the tub and shower. Do not use towel bars as grab bars. ?Use non-skid mats or decals in the tub or  shower. ?If you need to sit down in the shower, use a plastic, non-slip stool. ?Keep the floor dry. Clean up any water that spills on the floor as soon as it happens. ?Remove soap buildup in the tub or shower regularly. ?Attach bath mats securely with double-sided non-slip rug tape. ?Do not have throw rugs and other things on the floor that can make you trip. ?What can I do in the bedroom? ?Use night lights. ?Make sure that you have a light by your bed that is easy to reach. ?Do not use any sheets or blankets that are too big for your bed. They should not hang down onto the floor. ?Have a firm chair that has side arms. You can use this for support while you get dressed. ?Do not have throw rugs and other things on the floor that can make you trip. ?What can I do in the kitchen? ?Clean up any spills right away. ?Avoid walking on wet floors. ?Keep items that you use a lot in easy-to-reach places. ?If you need to reach something above you, use a strong step stool that has a grab bar. ?Keep electrical cords out of the way. ?Do not use floor polish or wax that makes floors slippery. If you must use wax, use non-skid floor wax. ?Do not have throw rugs and other things on the floor that can make you trip. ?  What can I do with my stairs? ?Do not leave any items on the stairs. ?Make sure that there are handrails on both sides of the stairs and use them. Fix handrails that are broken or loose. Make sure that handrails are as long as the stairways. ?Check any carpeting to make sure that it is firmly attached to the stairs. Fix any carpet that is loose or worn. ?Avoid having throw rugs at the top or bottom of the stairs. If you do have throw rugs, attach them to the floor with carpet tape. ?Make sure that you have a light switch at the top of the stairs and the bottom of the stairs. If you do not have them, ask someone to add them for you. ?What else can I do to help prevent falls? ?Wear shoes that: ?Do not have high heels. ?Have  rubber bottoms. ?Are comfortable and fit you well. ?Are closed at the toe. Do not wear sandals. ?If you use a stepladder: ?Make sure that it is fully opened. Do not climb a closed stepladder. ?Make sure th

## 2021-07-22 NOTE — Progress Notes (Signed)
? ?Subjective:  ? Jeff Stewart is a 66 y.o. male who presents for Medicare Annual/Subsequent preventive examination. ? ?Virtual Visit via Telephone Note ? ?I connected with  Jeff Stewart on 07/22/21 at  3:30 PM EDT by telephone and verified that I am speaking with the correct person using two identifiers. ? ?Location: ?Patient: Home ?Provider: [facility] ?Persons participating in the virtual visit: patient/Nurse Health Advisor ?  ?I discussed the limitations, risks, security and privacy concerns of performing an evaluation and management service by telephone and the availability of in person appointments. The patient expressed understanding and agreed to proceed. ? ?Interactive audio and video telecommunications were attempted between this nurse and patient, however failed, due to patient having technical difficulties OR patient did not have access to video capability.  We continued and completed visit with audio only. ? ?Some vital signs may be absent or patient reported.  ? ?Jeff Coia Dionne Ano, LPN  ? ?Review of Systems    ? ?Cardiac Risk Factors include: advanced age (>63mn, >>60women);male gender;hypertension;Other (see comment);obesity (BMI >30kg/m2);sedentary lifestyle, Risk factor comments: OSA - not using CPAP ? ?   ?Objective:  ?  ?Today's Vitals  ? 07/22/21 1533  ?Weight: 261 lb (118.4 kg)  ? ?Body mass index is 34.43 kg/m?. ? ? ?  07/22/2021  ?  3:45 PM 07/07/2020  ?  2:33 PM 05/09/2018  ?  1:39 PM 04/24/2018  ?  6:20 PM 04/24/2018  ? 10:33 AM 04/17/2018  ?  9:09 AM 11/22/2016  ?  8:19 AM  ?Advanced Directives  ?Does Patient Have a Medical Advance Directive? Yes No No No No No No  ?Type of [place]        ?Copy of [place]? No - copy requested        ?Would patient like information on creating a medical advance directive?  No - Patient declined  No - Patient declined No - Patient declined No - Patient declined   ? ? ?Current Medications  (verified) ?Outpatient Encounter Medications as of 07/22/2021  ?Medication Sig  ? acetaminophen (TYLENOL) 500 MG tablet Take 500 mg by mouth every 6 (six) hours as needed.  ? cetirizine (ZYRTEC) 10 MG tablet Take 1 tablet (10 mg total) by mouth daily.  ? fluticasone (FLONASE) 50 MCG/ACT nasal spray Place 2 sprays into both nostrils daily.  ? furosemide (LASIX) 40 MG tablet Take 1 tablet (40 mg total) by mouth every morning.  ? hydrochlorothiazide (HYDRODIURIL) 25 MG tablet Take 1 tablet (25 mg total) by mouth daily.  ? losartan (COZAAR) 50 MG tablet Take 1 tablet (50 mg total) by mouth 2 (two) times daily.  ? omeprazole (PRILOSEC) 20 MG capsule Take 1 capsule 2 times daily before a meal.  ? sertraline (ZOLOFT) 100 MG tablet Take 1.5 tablets (150 mg total) by mouth daily.  ? Syringe/Needle, Disp, (SYRINGE 3CC/21GX1-1/4") 21G X 1-1/4" 3 ML MISC 1 each by Does not apply route once a week.  ? testosterone cypionate (DEPOTESTOSTERONE CYPIONATE) 200 MG/ML injection INJECT 0.5 MLS INTRAMUSCULARY ONCE A WEEK  ? Turmeric 500 MG CAPS Take by mouth.  ? [DISCONTINUED] azithromycin (ZITHROMAX Z-PAK) 250 MG tablet As directed (Patient not taking: Reported on 07/22/2021)  ? [DISCONTINUED] benzonatate (TESSALON PERLES) 100 MG capsule Take 1 capsule (100 mg total) by mouth 3 (three) times daily as needed for cough. (Patient not taking: Reported on 07/22/2021)  ? ?No facility-administered encounter medications on file  as of 07/22/2021.  ? ? ?Allergies (verified) ?Nalbuphine and Nubain [nalbuphine hcl]  ? ?History: ?Past Medical History:  ?Diagnosis Date  ? Anxiety   ? Carpal tunnel syndrome, bilateral   ? Cataract   ? removed years ago  ? Chronic back pain   ? internal morphine pump  ? DDD (degenerative disc disease)   ? neck, lumbar  ? Depression   ? Dyslipidemia   ? diet controlled  ? GERD (gastroesophageal reflux disease)   ? past hx- had nissen fundiplication   ? Hypertension   ? borderline  ? Sleep apnea   ? wears C-PAP  ? Status post  insertion of spinal cord stimulator   ? ?Past Surgical History:  ?Procedure Laterality Date  ? BACK SURGERY    ? x 6  ? CARPAL TUNNEL RELEASE    ? bilateral  ? COLONOSCOPY    ? ELBOW SURGERY    ? INGUINAL HERNIA REPAIR Right   ? INTERNAL MORPHINE PUMP     ? KNEE ARTHROSCOPY    ? NASAL SEPTUM SURGERY    ? x 6  ? neck fusion     ? NISSEN FUNDOPLICATION    ? SKIN CANCER EXCISION    ? SPINAL CORD STIMULATOR INSERTION    ? STOMACH SURGERY    ? Nissen Fundiplication  ? thumb surgery    ? TOTAL KNEE ARTHROPLASTY Left 04/24/2018  ? Procedure: TOTAL KNEE ARTHROPLASTY;  Surgeon: Renette Butters, MD;  Location: [facility];  Service: Orthopedics;  Laterality: Left;  ? UPPER GASTROINTESTINAL ENDOSCOPY    ? ?Family History  ?Problem Relation Age of Onset  ? CAD Father 61  ? Emphysema Father   ? Stroke Mother 38  ? CAD Brother 42  ?     CABG  ? Alcohol abuse Brother   ? Hypertension Brother   ? Stroke Brother   ? CAD Brother   ? Cirrhosis Brother   ? Alcohol abuse Brother   ? Hypertension Brother   ? CAD Sister   ? Hypertension Sister   ? Hyperlipidemia Sister   ? Cancer Sister   ?     melanoma  ? Cancer Sister   ? Lung cancer Sister   ? Cancer Brother   ? Lung cancer Brother   ? Cancer Brother   ? Lung cancer Brother   ? Colon cancer Neg Hx   ? Colon polyps Neg Hx   ? Esophageal cancer Neg Hx   ? Rectal cancer Neg Hx   ? Stomach cancer Neg Hx   ? ?Social History  ? ?Socioeconomic History  ? Marital status: Married  ?  Spouse name: Not on file  ? Number of children: 3  ? Years of education: Not on file  ? Highest education level: Not on file  ?Occupational History  ? 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  Comment: Retired  ?Tobacco Use  ? Smoking status: Never  ? Smokeless tobacco: Never  ?Vaping Use  ? Vaping Use: Never used  ?Substance and Sexual Activity  ? Alcohol use: No  ? Drug use: No  ? Sexual activity: Not Currently  ?  Birth control/protection: Post-menopausal  ?  Comment: married for 1972  ?Other Topics Concern  ? Not on file   ?Social History Narrative  ? Lives with wife   ? Children live nearby  ? ?Social Determinants of Health  ? ?Financial Resource Strain: Low Risk   ? Difficulty of Paying Living Expenses: Not hard  at all  ?Food Insecurity: No Food Insecurity  ? Worried About Charity fundraiser in the Last Year: Never true  ? Ran Out of Food in the Last Year: Never true  ?Transportation Needs: No Transportation Needs  ? Lack of Transportation (Medical): No  ? Lack of Transportation (Non-Medical): No  ?Physical Activity: Inactive  ? Days of Exercise per Week: 0 days  ? Minutes of Exercise per Session: 0 min  ?Stress: No Stress Concern Present  ? Feeling of Stress : Only a little  ?Social Connections: Socially Integrated  ? Frequency of Communication with Friends and Family: More than three times a week  ? Frequency of Social Gatherings with Friends and Family: More than three times a week  ? Attends Religious Services: More than 4 times per year  ? Active Member of Clubs or Organizations: Yes  ? Attends Archivist Meetings: More than 4 times per year  ? Marital Status: Married  ? ? ?Tobacco Counseling ?Counseling given: Not Answered ? ? ?Clinical Intake: ? ?Pre-visit preparation completed: Yes ? ?Pain : No/denies pain ? ?  ? ?BMI - recorded: 34.43 ?Nutritional Status: BMI > 30  Obese ?Nutritional Risks: None ?Diabetes: No ? ?How often do you need to have someone help you when you read instructions, pamphlets, or other written materials from your doctor or pharmacy?: 1 - Never ? ?Diabetic?no ? ?Interpreter Needed?: No ? ?Information entered by :: Matty Vanroekel, LPN ? ? ?Activities of Daily Living ? ?  07/22/2021  ?  3:33 PM  ?In your present state of health, do you have any difficulty performing the following activities:  ?Hearing? 0  ?Vision? 0  ?Difficulty concentrating or making decisions? 0  ?Walking or climbing stairs? 1  ?Comment hurts knees  ?Dressing or bathing? 0  ?Doing errands, shopping? 0  ?Preparing Food and eating ?  N  ?Using the Toilet? N  ?In the past six months, have you accidently leaked urine? N  ?Do you have problems with loss of bowel control? N  ?Managing your Medications? N  ?Managing your Finances? N  ?Rozetta Nunnery

## 2021-07-23 ENCOUNTER — Encounter: Payer: Self-pay | Admitting: "Endocrinology

## 2021-07-23 ENCOUNTER — Ambulatory Visit: Payer: Medicare Other | Admitting: "Endocrinology

## 2021-07-23 VITALS — BP 126/72 | HR 72 | Ht 73.0 in | Wt 262.6 lb

## 2021-07-23 DIAGNOSIS — E291 Testicular hypofunction: Secondary | ICD-10-CM

## 2021-07-23 MED ORDER — TESTOSTERONE CYPIONATE 200 MG/ML IM SOLN: INTRAMUSCULAR | 0 refills | Status: DC

## 2021-07-23 NOTE — Progress Notes (Signed)
?07/23/2021 ?                ?    ?Endocrinology follow-up note ? ? ?Subjective:  ? ? Patient ID: Jeff Stewart, male    DOB: 02-22-1956, PCP Dettinger, Jeff Kaufmann, MD ? ? ?Past Medical History:  ?Diagnosis Date  ? Anxiety   ? Carpal tunnel syndrome, bilateral   ? Cataract   ? removed years ago  ? Chronic back pain   ? internal morphine pump  ? DDD (degenerative disc disease)   ? neck, lumbar  ? Depression   ? Dyslipidemia   ? diet controlled  ? GERD (gastroesophageal reflux disease)   ? past hx- had nissen fundiplication   ? Hypertension   ? borderline  ? Sleep apnea   ? wears C-PAP  ? Status post insertion of spinal cord stimulator   ? ?Past Surgical History:  ?Procedure Laterality Date  ? BACK SURGERY    ? x 6  ? CARPAL TUNNEL RELEASE    ? bilateral  ? COLONOSCOPY    ? ELBOW SURGERY    ? INGUINAL HERNIA REPAIR Right   ? INTERNAL MORPHINE PUMP     ? KNEE ARTHROSCOPY    ? NASAL SEPTUM SURGERY    ? x 6  ? neck fusion     ? NISSEN FUNDOPLICATION    ? SKIN CANCER EXCISION    ? SPINAL CORD STIMULATOR INSERTION    ? STOMACH SURGERY    ? Nissen Fundiplication  ? thumb surgery    ? TOTAL KNEE ARTHROPLASTY Left 04/24/2018  ? Procedure: TOTAL KNEE ARTHROPLASTY;  Surgeon: Renette Butters, MD;  Location: [facility];  Service: Orthopedics;  Laterality: Left;  ? UPPER GASTROINTESTINAL ENDOSCOPY    ? ?Social History  ? ?Socioeconomic History  ? Marital status: Married  ?  Spouse name: Not on file  ? Number of children: 3  ? Years of education: Not on file  ? Highest education level: Not on file  ?Occupational History  ? Occupation: [employer]  ?  Comment: Retired  ?Tobacco Use  ? Smoking status: Never  ? Smokeless tobacco: Never  ?Vaping Use  ? Vaping Use: Never used  ?Substance and Sexual Activity  ? Alcohol use: No  ? Drug use: No  ? Sexual activity: Not Currently  ?  Birth control/protection: Post-menopausal  ?  Comment: married for 1972  ?Other Topics Concern  ? Not on file  ?Social History Narrative  ? Lives with wife    ? Children live nearby  ? ?Social Determinants of Health  ? ?Financial Resource Strain: Low Risk   ? Difficulty of Paying Living Expenses: Not hard at all  ?Food Insecurity: No Food Insecurity  ? Worried About Charity fundraiser in the Last Year: Never true  ? Ran Out of Food in the Last Year: Never true  ?Transportation Needs: No Transportation Needs  ? Lack of Transportation (Medical): No  ? Lack of Transportation (Non-Medical): No  ?Physical Activity: Inactive  ? Days of Exercise per Week: 0 days  ? Minutes of Exercise per Session: 0 min  ?Stress: No Stress Concern Present  ? Feeling of Stress : Only a little  ?Social Connections: Socially Integrated  ? Frequency of Communication with Friends and Family: More than three times a week  ? Frequency of Social Gatherings with Friends and Family: More than three times a week  ? Attends Religious Services: More than 4 times per year  ?  Active Member of Clubs or Organizations: Yes  ? Attends Archivist Meetings: More than 4 times per year  ? Marital Status: Married  ? ?Outpatient Encounter Medications as of 07/23/2021  ?Medication Sig  ? acetaminophen (TYLENOL) 500 MG tablet Take 500 mg by mouth every 6 (six) hours as needed.  ? cetirizine (ZYRTEC) 10 MG tablet Take 1 tablet (10 mg total) by mouth daily.  ? fluticasone (FLONASE) 50 MCG/ACT nasal spray Place 2 sprays into both nostrils daily.  ? furosemide (LASIX) 40 MG tablet Take 1 tablet (40 mg total) by mouth every morning.  ? hydrochlorothiazide (HYDRODIURIL) 25 MG tablet Take 1 tablet (25 mg total) by mouth daily.  ? losartan (COZAAR) 50 MG tablet Take 1 tablet (50 mg total) by mouth 2 (two) times daily.  ? omeprazole (PRILOSEC) 20 MG capsule Take 1 capsule 2 times daily before a meal.  ? sertraline (ZOLOFT) 100 MG tablet Take 1.5 tablets (150 mg total) by mouth daily.  ? Syringe/Needle, Disp, (SYRINGE 3CC/21GX1-1/4") 21G X 1-1/4" 3 ML MISC 1 each by Does not apply route once a week.  ? testosterone  cypionate (DEPOTESTOSTERONE CYPIONATE) 200 MG/ML injection INJECT 0.5 MLS INTRAMUSCULARY EVERY OTHER WEEK  ? Turmeric 500 MG CAPS Take by mouth.  ? [DISCONTINUED] testosterone cypionate (DEPOTESTOSTERONE CYPIONATE) 200 MG/ML injection INJECT 0.5 MLS INTRAMUSCULARY ONCE A WEEK  ? ?No facility-administered encounter medications on file as of 07/23/2021.  ? ?ALLERGIES: ?Allergies  ?Allergen Reactions  ? Nalbuphine Nausea And Vomiting, Rash and Shortness Of Breath  ? Nubain [Nalbuphine Hcl] Rash  ? ?VACCINATION STATUS: ?Immunization History  ?Administered Date(s) Administered  ? Fluad Quad(high Dose 65+) 12/21/2018, 12/22/2020  ? Influenza Split 01/19/2014  ? Influenza,inj,Quad PF,6+ Mos 01/14/2015, 02/02/2016, 02/08/2018  ? Influenza-Unspecified 02/19/2014, 02/24/2020  ? Moderna Sars-Covid-2 Vaccination 05/12/2019, 05/23/2019, 03/10/2020  ? Pneumococcal Conjugate-13 08/09/2019  ? Pneumococcal Polysaccharide-23 04/23/2020  ? Tdap 12/11/2014  ? Zoster Recombinat (Shingrix) 04/23/2020, 10/08/2020  ? ? ?HPI ?[name] is a 66 year old gentleman with a medical history as above.  He is being seen in follow-up for hypogonadism on testosterone replacement therapy.  ? ?-He remains on testosterone cypionate 100 mg IM every 7 days.  His previsit labs show total testosterone of 700 increasing from 325.  He continues to feel better.  He has no new complaints.  He underwent hand surgery for arthritic changes.  ?  ?-He reports better energy level, better mobility. ?He was known to have low testosterone for at least since age 66.  His previsit labs show total testosterone of 340-remaining stable.  ?No evidence of adverse effects from testosterone treatment.  He wishes to be continued on testosterone placement therapy. ?He fathers 3 grown children. He denies history of head injury , has had nasal septal surgery multiple times. He denies injury to the testicles, exposure to chemotherapy, exposure to radiation to the genitals. However,  due to chronic back injury and surgery he has exposure to heavy opioid therapy for pain control. He is currently on morphine pump. He has been off and  on  opioids for pain control for the last 10-15 years.  ?He struggles with sleep apnea, even changing CPAP machines did not help much. ? ?Review of Systems ?Limited as above. ? ?Objective:  ?  ?BP 126/72   Pulse 72   Ht '6\' 1"'$  (1.854 m)   Wt 262 lb 9.6 oz (119.1 kg)   BMI 34.65 kg/m?   ?Wt Readings from Last 3 Encounters:  ?07/23/21 262  lb 9.6 oz (119.1 kg)  ?07/22/21 261 lb (118.4 kg)  ?06/17/21 261 lb (118.4 kg)  ?  ? ? ?From previous exam. ? ?Genitourinary system: He has bilaterally shrunk testicles, right testes 12 mL, left testes 15 mL. No scrotal mass, no varicocele,  no inguinal lymphadenopathy. ? ? ? ?Recent Results (from the past 2160 hour(s))  ?Testosterone, Free, Total, SHBG     Status: Abnormal  ? Collection Time: 07/16/21  8:12 AM  ?Result Value Ref Range  ? Testosterone 700 264 - 916 ng/dL  ?  Comment: Adult male reference interval is based on a population of ?healthy nonobese males (BMI <30) between 41 and 53 years old. ?Reader, [place] 2017,102;1161-1173. PMID: 12751700. ?  ? Testosterone, Free 18.4 (H) 6.6 - 18.1 pg/mL  ? Sex Hormone Binding 20.2 19.3 - 76.4 nmol/L  ?CBC with Differential/Platelet     Status: Abnormal  ? Collection Time: 07/16/21  8:12 AM  ?Result Value Ref Range  ? WBC 4.1 3.4 - 10.8 x10E3/uL  ? RBC 4.99 4.14 - 5.80 x10E6/uL  ? Hemoglobin 14.0 13.0 - 17.7 g/dL  ? Hematocrit 43.3 37.5 - 51.0 %  ? MCV 87 79 - 97 fL  ? MCH 28.1 26.6 - 33.0 pg  ? MCHC 32.3 31.5 - 35.7 g/dL  ? RDW 11.9 11.6 - 15.4 %  ? Platelets 140 (L) 150 - 450 x10E3/uL  ? Neutrophils 49 Not Estab. %  ? Lymphs 34 Not Estab. %  ? Monocytes 11 Not Estab. %  ? Eos 5 Not Estab. %  ? Basos 1 Not Estab. %  ? Neutrophils Absolute 2.0 1.4 - 7.0 x10E3/uL  ? Lymphocytes Absolute 1.4 0.7 - 3.1 x10E3/uL  ? Monocytes Absolute 0.5 0.1 - 0.9 x10E3/uL  ? EOS (ABSOLUTE) 0.2  0.0 - 0.4 x10E3/uL  ? Basophils Absolute 0.0 0.0 - 0.2 x10E3/uL  ? Immature Granulocytes 0 Not Estab. %  ? Immature Grans (Abs) 0.0 0.0 - 0.1 x10E3/uL  ?Comprehensive metabolic panel     Status: None  ? Collection T

## 2021-08-09 DIAGNOSIS — M7052 Other bursitis of knee, left knee: Secondary | ICD-10-CM | POA: Diagnosis not present

## 2021-08-31 DIAGNOSIS — S301XXD Contusion of abdominal wall, subsequent encounter: Secondary | ICD-10-CM | POA: Diagnosis not present

## 2021-08-31 DIAGNOSIS — M542 Cervicalgia: Secondary | ICD-10-CM | POA: Diagnosis not present

## 2021-08-31 DIAGNOSIS — Z978 Presence of other specified devices: Secondary | ICD-10-CM | POA: Diagnosis not present

## 2021-08-31 DIAGNOSIS — M5136 Other intervertebral disc degeneration, lumbar region: Secondary | ICD-10-CM | POA: Diagnosis not present

## 2021-08-31 DIAGNOSIS — M5410 Radiculopathy, site unspecified: Secondary | ICD-10-CM | POA: Diagnosis not present

## 2021-08-31 DIAGNOSIS — Z5181 Encounter for therapeutic drug level monitoring: Secondary | ICD-10-CM | POA: Diagnosis not present

## 2021-08-31 DIAGNOSIS — G894 Chronic pain syndrome: Secondary | ICD-10-CM | POA: Diagnosis not present

## 2021-08-31 DIAGNOSIS — M961 Postlaminectomy syndrome, not elsewhere classified: Secondary | ICD-10-CM | POA: Diagnosis not present

## 2021-08-31 DIAGNOSIS — M5030 Other cervical disc degeneration, unspecified cervical region: Secondary | ICD-10-CM | POA: Diagnosis not present

## 2021-08-31 DIAGNOSIS — Z79899 Other long term (current) drug therapy: Secondary | ICD-10-CM | POA: Diagnosis not present

## 2021-09-10 ENCOUNTER — Ambulatory Visit (INDEPENDENT_AMBULATORY_CARE_PROVIDER_SITE_OTHER): Payer: Medicare Other | Admitting: Family Medicine

## 2021-09-10 ENCOUNTER — Encounter: Payer: Self-pay | Admitting: Family Medicine

## 2021-09-10 VITALS — BP 122/70 | HR 58 | Temp 97.3°F | Resp 20 | Ht 73.0 in | Wt 253.0 lb

## 2021-09-10 DIAGNOSIS — R5382 Chronic fatigue, unspecified: Secondary | ICD-10-CM

## 2021-09-10 DIAGNOSIS — K219 Gastro-esophageal reflux disease without esophagitis: Secondary | ICD-10-CM | POA: Diagnosis not present

## 2021-09-10 DIAGNOSIS — G4733 Obstructive sleep apnea (adult) (pediatric): Secondary | ICD-10-CM | POA: Diagnosis not present

## 2021-09-10 DIAGNOSIS — R6889 Other general symptoms and signs: Secondary | ICD-10-CM | POA: Diagnosis not present

## 2021-09-10 DIAGNOSIS — G479 Sleep disorder, unspecified: Secondary | ICD-10-CM

## 2021-09-10 DIAGNOSIS — R5381 Other malaise: Secondary | ICD-10-CM

## 2021-09-10 MED ORDER — PANTOPRAZOLE SODIUM 40 MG PO TBEC: 40.0000 mg | DELAYED_RELEASE_TABLET | Freq: Every day | ORAL | 3 refills | Status: DC

## 2021-09-11 LAB — ANEMIA PROFILE B
Basophils Absolute: 0 10*3/uL (ref 0.0–0.2)
Basos: 1 %
EOS (ABSOLUTE): 0.2 10*3/uL (ref 0.0–0.4)
Eos: 4 %
Ferritin: 67 ng/mL (ref 30–400)
Folate: 12.4 ng/mL (ref >3.0)
Hematocrit: 44.6 % (ref 37.5–51.0)
Hemoglobin: 14.7 g/dL (ref 13.0–17.7)
Immature Grans (Abs): 0 10*3/uL (ref 0.0–0.1)
Immature Granulocytes: 0 %
Iron Saturation: 31 % (ref 15–55)
Iron: 125 ug/dL (ref 38–169)
Lymphocytes Absolute: 1.4 10*3/uL (ref 0.7–3.1)
Lymphs: 31 %
MCH: 27.8 pg (ref 26.6–33.0)
MCHC: 33 g/dL (ref 31.5–35.7)
MCV: 85 fL (ref 79–97)
Monocytes Absolute: 0.4 10*3/uL (ref 0.1–0.9)
Monocytes: 9 %
Neutrophils Absolute: 2.5 10*3/uL (ref 1.4–7.0)
Neutrophils: 55 %
Platelets: 159 10*3/uL (ref 150–450)
RBC: 5.28 x10E6/uL (ref 4.14–5.80)
RDW: 13.4 % (ref 11.6–15.4)
Retic Ct Pct: 0.9 % (ref 0.6–2.6)
Total Iron Binding Capacity: 407 ug/dL (ref 250–450)
UIBC: 282 ug/dL (ref 111–343)
Vitamin B-12: 275 pg/mL (ref 232–1245)
WBC: 4.5 10*3/uL (ref 3.4–10.8)

## 2021-09-11 LAB — CMP14+EGFR
ALT: 21 IU/L (ref 0–44)
AST: 22 IU/L (ref 0–40)
Albumin/Globulin Ratio: 2 (ref 1.2–2.2)
Albumin: 4.5 g/dL (ref 3.8–4.8)
Alkaline Phosphatase: 93 IU/L (ref 44–121)
BUN/Creatinine Ratio: 18 (ref 10–24)
BUN: 16 mg/dL (ref 8–27)
Bilirubin Total: 0.5 mg/dL (ref 0.0–1.2)
CO2: 23 mmol/L (ref 20–29)
Calcium: 9.2 mg/dL (ref 8.6–10.2)
Chloride: 99 mmol/L (ref 96–106)
Creatinine, Ser: 0.91 mg/dL (ref 0.76–1.27)
Globulin, Total: 2.2 g/dL (ref 1.5–4.5)
Glucose: 92 mg/dL (ref 70–99)
Potassium: 3.7 mmol/L (ref 3.5–5.2)
Sodium: 140 mmol/L (ref 134–144)
Total Protein: 6.7 g/dL (ref 6.0–8.5)
eGFR: 92 mL/min/{1.73_m2} (ref >59)

## 2021-09-11 LAB — THYROID PANEL WITH TSH
Free Thyroxine Index: 2 (ref 1.2–4.9)
T3 Uptake Ratio: 23 % — ABNORMAL LOW (ref 24–39)
T4, Total: 8.5 ug/dL (ref 4.5–12.0)
TSH: 3.13 u[IU]/mL (ref 0.450–4.500)

## 2021-09-11 LAB — VITAMIN D 25 HYDROXY (VIT D DEFICIENCY, FRACTURES): Vit D, 25-Hydroxy: 19.1 ng/mL — ABNORMAL LOW (ref 30.0–100.0)

## 2021-09-15 NOTE — Progress Notes (Signed)
Calling back about lab results. Please call back. [phone]

## 2021-10-08 ENCOUNTER — Encounter: Payer: Self-pay | Admitting: Family Medicine

## 2021-10-08 ENCOUNTER — Ambulatory Visit (INDEPENDENT_AMBULATORY_CARE_PROVIDER_SITE_OTHER): Payer: Medicare Other | Admitting: Family Medicine

## 2021-10-08 VITALS — BP 136/81 | HR 63 | Temp 98.0°F | Ht 73.0 in | Wt 258.0 lb

## 2021-10-08 DIAGNOSIS — K219 Gastro-esophageal reflux disease without esophagitis: Secondary | ICD-10-CM | POA: Diagnosis not present

## 2021-10-08 DIAGNOSIS — F32A Depression, unspecified: Secondary | ICD-10-CM

## 2021-10-08 DIAGNOSIS — F419 Anxiety disorder, unspecified: Secondary | ICD-10-CM

## 2021-10-08 DIAGNOSIS — I10 Essential (primary) hypertension: Secondary | ICD-10-CM

## 2021-10-08 NOTE — Progress Notes (Signed)
BP 136/81   Pulse 63   Temp 98 F (36.7 C)   Ht '6\' 1"'$  (1.854 m)   Wt 258 lb (117 kg)   SpO2 98%   BMI 34.04 kg/m    Subjective:   Patient ID: Jeff Stewart, male    DOB: [birth year], 66 y.o.   MRN: [id number]  HPI: Jeff Stewart is a 66 y.o. male presenting on 10/08/2021 for No chief complaint on file.   HPI Anxiety and depression recheck Patient is currently taking sertraline for anxiety and depression.  Coming in for recheck today.  Patient feels like he is doing well with anxiety and depression.  The sertraline is helping.    10/08/2021    8:03 AM 09/10/2021    8:28 AM 07/22/2021    3:46 PM 06/17/2021    9:19 AM 04/12/2021    8:06 AM  Depression screen PHQ 2/9  Decreased Interest 0 '1 1 1 1  '$ Down, Depressed, Hopeless 0 1 0 0 0  PHQ - 2 Score 0 '2 1 1 1  '$ Altered sleeping 0 '2 3 3 2  '$ Tired, decreased energy 0 '2 3 3 3  '$ Change in appetite 0 1 0 0 1  Feeling bad or failure about yourself  0 0 0 0 0  Trouble concentrating 0 1 0 0 1  Moving slowly or fidgety/restless 0 1 0 0 1  Suicidal thoughts 0 0 0 0 0  PHQ-9 Score 0 '9 7 7 9  '$ Difficult doing work/chores Not difficult at all Somewhat difficult Somewhat difficult Not difficult at all     Hypertension Patient is currently on losartan hydrochlorothiazide, and their blood pressure today is 136/81. Patient denies any lightheadedness or dizziness. Patient denies headaches, blurred vision, chest pains, shortness of breath, or weakness. Denies any side effects from medication and is content with current medication.   GERD Patient is currently on pantoprazole.  She denies any major symptoms or abdominal pain or belching or burping. She denies any blood in her stool or lightheadedness or dizziness.   Relevant past medical, surgical, family and social history reviewed and updated as indicated. Interim medical history since our last visit reviewed. Allergies and medications reviewed and updated.  Review of Systems  Constitutional:   Negative for chills and fever.  Eyes:  Negative for visual disturbance.  Respiratory:  Negative for shortness of breath and wheezing.   Cardiovascular:  Negative for chest pain and leg swelling.  Musculoskeletal:  Negative for back pain and gait problem.  Skin:  Negative for rash.  Neurological:  Positive for dizziness. Negative for weakness and light-headedness.  All other systems reviewed and are negative.   Per HPI unless specifically indicated above   Allergies as of 10/08/2021       Reactions   Nalbuphine Nausea And Vomiting, Rash, Shortness Of Breath   Nubain [nalbuphine Hcl] Rash        Medication List        Accurate as of October 08, 2021  8:19 AM. If you have any questions, ask your nurse or doctor.          acetaminophen 500 MG tablet Commonly known as: TYLENOL Take 500 mg by mouth every 6 (six) hours as needed.   cetirizine 10 MG tablet Commonly known as: ZYRTEC Take 1 tablet (10 mg total) by mouth daily.   cholecalciferol 25 MCG (1000 UNIT) tablet Commonly known as: VITAMIN D3 Take 1,000 Units by mouth daily.   fluticasone 50 MCG/ACT  nasal spray Commonly known as: FLONASE Place 2 sprays into both nostrils daily.   furosemide 40 MG tablet Commonly known as: LASIX Take 1 tablet (40 mg total) by mouth every morning.   hydrochlorothiazide 25 MG tablet Commonly known as: HYDRODIURIL Take 1 tablet (25 mg total) by mouth daily.   losartan 50 MG tablet Commonly known as: COZAAR Take 1 tablet (50 mg total) by mouth 2 (two) times daily.   pantoprazole 40 MG tablet Commonly known as: PROTONIX Take 1 tablet (40 mg total) by mouth daily.   sertraline 100 MG tablet Commonly known as: ZOLOFT Take 1.5 tablets (150 mg total) by mouth daily.   SYRINGE 3CC/21GX1-1/4" 21G X 1-1/4" 3 ML Misc 1 each by Does not apply route once a week.   testosterone cypionate 200 MG/ML injection Commonly known as: DEPOTESTOSTERONE CYPIONATE INJECT 0.5 MLS INTRAMUSCULARY  EVERY OTHER WEEK   Turmeric 500 MG Caps Take by mouth.         Objective:   BP 136/81   Pulse 63   Temp 98 F (36.7 C)   Ht '6\' 1"'$  (1.854 m)   Wt 258 lb (117 kg)   SpO2 98%   BMI 34.04 kg/m   Wt Readings from Last 3 Encounters:  10/08/21 258 lb (117 kg)  09/10/21 253 lb (114.8 kg)  07/23/21 262 lb 9.6 oz (119.1 kg)    Physical Exam Vitals and nursing note reviewed.  Constitutional:      General: He is not in acute distress.    Appearance: He is well-developed. He is not diaphoretic.  Eyes:     General: No scleral icterus.    Conjunctiva/sclera: Conjunctivae normal.  Neck:     Thyroid: No thyromegaly.  Cardiovascular:     Rate and Rhythm: Normal rate and regular rhythm.     Heart sounds: Normal heart sounds. No murmur heard. Pulmonary:     Effort: Pulmonary effort is normal. No respiratory distress.     Breath sounds: Normal breath sounds. No wheezing.  Musculoskeletal:        General: No swelling. Normal range of motion.     Cervical back: Neck supple.  Lymphadenopathy:     Cervical: No cervical adenopathy.  Skin:    General: Skin is warm and dry.     Findings: No rash.  Neurological:     Mental Status: He is alert and oriented to person, place, and time.     Coordination: Coordination normal.  Psychiatric:        Behavior: Behavior normal.       Assessment & Plan:   Problem List Items Addressed This Visit       Cardiovascular and Mediastinum   Essential hypertension, benign - Primary   Relevant Orders   Lipid panel     Digestive   GERD (gastroesophageal reflux disease)     Other   Anxiety and depression    Continue current medicine, discussed possibly going to a neurologist for his recurrent vertigo but he declined today. Follow up plan: Return in about 6 months (around 04/10/2022), or if symptoms worsen or fail to improve, for Physical and hypertension.  Counseling provided for all of the vaccine components Orders Placed This Encounter   Procedures   Lipid panel    Caryl Pina, MD [facility] 10/08/2021, 8:19 AM

## 2021-10-09 LAB — LIPID PANEL
Chol/HDL Ratio: 3.7 ratio (ref 0.0–5.0)
Cholesterol, Total: 166 mg/dL (ref 100–199)
HDL: 45 mg/dL (ref >39)
LDL Chol Calc (NIH): 102 mg/dL — ABNORMAL HIGH (ref 0–99)
Triglycerides: 101 mg/dL (ref 0–149)
VLDL Cholesterol Cal: 19 mg/dL (ref 5–40)

## 2021-10-19 DIAGNOSIS — L814 Other melanin hyperpigmentation: Secondary | ICD-10-CM | POA: Diagnosis not present

## 2021-10-19 DIAGNOSIS — L570 Actinic keratosis: Secondary | ICD-10-CM | POA: Diagnosis not present

## 2021-10-19 DIAGNOSIS — K130 Diseases of lips: Secondary | ICD-10-CM | POA: Diagnosis not present

## 2021-10-19 DIAGNOSIS — L821 Other seborrheic keratosis: Secondary | ICD-10-CM | POA: Diagnosis not present

## 2021-10-27 DIAGNOSIS — M961 Postlaminectomy syndrome, not elsewhere classified: Secondary | ICD-10-CM | POA: Diagnosis not present

## 2021-10-27 DIAGNOSIS — G894 Chronic pain syndrome: Secondary | ICD-10-CM | POA: Diagnosis not present

## 2021-10-27 DIAGNOSIS — M5030 Other cervical disc degeneration, unspecified cervical region: Secondary | ICD-10-CM | POA: Diagnosis not present

## 2021-10-27 DIAGNOSIS — M5136 Other intervertebral disc degeneration, lumbar region: Secondary | ICD-10-CM | POA: Diagnosis not present

## 2021-10-27 DIAGNOSIS — R079 Chest pain, unspecified: Secondary | ICD-10-CM | POA: Diagnosis not present

## 2021-11-24 ENCOUNTER — Ambulatory Visit: Payer: Medicare Other | Admitting: "Endocrinology

## 2021-11-25 ENCOUNTER — Other Ambulatory Visit: Payer: Medicare Other

## 2021-12-01 LAB — TESTOSTERONE, FREE, TOTAL, SHBG
Sex Hormone Binding: 22.2 nmol/L (ref 19.3–76.4)
Testosterone, Free: 2.2 pg/mL — ABNORMAL LOW (ref 6.6–18.1)
Testosterone: 135 ng/dL — ABNORMAL LOW (ref 264–916)

## 2021-12-01 LAB — COMPREHENSIVE METABOLIC PANEL
ALT: 19 IU/L (ref 0–44)
AST: 16 IU/L (ref 0–40)
Albumin/Globulin Ratio: 2.1 (ref 1.2–2.2)
Albumin: 4.7 g/dL (ref 3.9–4.9)
Alkaline Phosphatase: 96 IU/L (ref 44–121)
BUN/Creatinine Ratio: 24 (ref 10–24)
BUN: 22 mg/dL (ref 8–27)
Bilirubin Total: 0.5 mg/dL (ref 0.0–1.2)
CO2: 27 mmol/L (ref 20–29)
Calcium: 9.5 mg/dL (ref 8.6–10.2)
Chloride: 98 mmol/L (ref 96–106)
Creatinine, Ser: 0.92 mg/dL (ref 0.76–1.27)
Globulin, Total: 2.2 g/dL (ref 1.5–4.5)
Glucose: 92 mg/dL (ref 70–99)
Potassium: 4.3 mmol/L (ref 3.5–5.2)
Sodium: 141 mmol/L (ref 134–144)
Total Protein: 6.9 g/dL (ref 6.0–8.5)
eGFR: 91 mL/min/{1.73_m2} (ref >59)

## 2021-12-07 ENCOUNTER — Ambulatory Visit: Payer: Medicare Other | Admitting: "Endocrinology

## 2021-12-07 ENCOUNTER — Encounter: Payer: Self-pay | Admitting: "Endocrinology

## 2021-12-07 VITALS — BP 112/66 | HR 60 | Ht 73.0 in | Wt 260.2 lb

## 2021-12-07 DIAGNOSIS — E559 Vitamin D deficiency, unspecified: Secondary | ICD-10-CM | POA: Diagnosis not present

## 2021-12-07 DIAGNOSIS — E291 Testicular hypofunction: Secondary | ICD-10-CM

## 2021-12-07 MED ORDER — TESTOSTERONE CYPIONATE 200 MG/ML IM SOLN: INTRAMUSCULAR | 0 refills | Status: DC

## 2021-12-07 NOTE — Progress Notes (Signed)
12/07/2021                     Endocrinology follow-up note   Subjective:    Patient ID: Jeff Jeff Stewart, male    DOB: [birth year], PCP Jeff Jeff Stewart, Jeff Kaufmann, MD   Past Medical History:  Diagnosis Date   Anxiety    Carpal tunnel syndrome, bilateral    Cataract    removed years ago   Chronic back pain    internal morphine pump   DDD (degenerative disc disease)    neck, lumbar   Depression    Dyslipidemia    diet controlled   GERD (gastroesophageal reflux disease)    past hx- had nissen fundiplication    Hypertension    borderline   Sleep apnea    wears C-PAP   Status post insertion of spinal cord stimulator    Past Surgical History:  Procedure Laterality Date   BACK SURGERY     x 6   CARPAL TUNNEL RELEASE     bilateral   COLONOSCOPY     ELBOW SURGERY     INGUINAL HERNIA REPAIR Right    INTERNAL MORPHINE PUMP      KNEE ARTHROSCOPY     NASAL SEPTUM SURGERY     x 6   neck fusion      NISSEN FUNDOPLICATION     SKIN CANCER EXCISION     SPINAL CORD STIMULATOR INSERTION     STOMACH SURGERY     Nissen Fundiplication   thumb surgery     TOTAL KNEE ARTHROPLASTY Left 04/24/2018   Procedure: TOTAL KNEE ARTHROPLASTY;  Surgeon: Renette Butters, MD;  Location: [facility];  Service: Orthopedics;  Laterality: Left;   UPPER GASTROINTESTINAL ENDOSCOPY     Social History   Socioeconomic History   Marital status: Married    Spouse name: Not on file   Number of children: 3   Years of education: Not on file   Highest education level: Not on file  Occupational History   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  Tobacco Use   Smoking status: Never   Smokeless tobacco: Never  Vaping Use   Vaping Use: Never used  Substance and Sexual Activity   Alcohol use: No   Drug use: No   Sexual activity: Not Currently    Birth control/protection: Post-menopausal    Comment: married for 1972  Other Topics Concern   Not on file  Social History Narrative   Lives with wife     Children live nearby   Social Determinants of Health   Financial Resource Strain: [place]  (07/22/2021)   Overall Financial Resource Strain (CARDIA)    Difficulty of Paying Living Expenses: Not hard at all  Food Insecurity: No [place] (07/22/2021)   Hunger Vital Sign    Worried About Running Out of Food in the Last Year: Never true    [place] in the Last Year: Never true  Transportation Needs: No Transportation Needs (07/22/2021)   PRAPARE - Hydrologist (Medical): No    Lack of Transportation (Non-Medical): No  Physical Activity: Inactive (07/22/2021)   Exercise Vital Sign    Days of Exercise per Week: 0 days    Minutes of Exercise per Session: 0 min  Stress: No Stress Concern Present (07/22/2021)   [place]    Feeling of Stress : Only Jeff Stewart little  Social Connections: Socially Integrated (07/22/2021)   Social Connection and Isolation Panel [NHANES]    Frequency of Communication with Friends and Family: More than three times Jeff Stewart week    Frequency of Social Gatherings with Friends and Family: More than three times Jeff Stewart week    Attends Religious Services: More than 4 times per year    Active Member of [employer] or Organizations: Yes    Attends Archivist Meetings: More than 4 times per year    Marital Status: Married   Outpatient Encounter Medications as of 12/07/2021  Medication Sig   Cholecalciferol (VITAMIN D3) 125 MCG (5000 UT) CAPS Take 5,000 Units by mouth daily with breakfast.   acetaminophen (TYLENOL) 500 MG tablet Take 500 mg by mouth every 6 (six) hours as needed.   cetirizine (ZYRTEC) 10 MG tablet Take 1 tablet (10 mg total) by mouth daily.   fluticasone (FLONASE) 50 MCG/ACT nasal spray Place 2 sprays into both nostrils daily.   furosemide (LASIX) 40 MG tablet Take 1 tablet (40 mg total) by mouth every morning.   hydrochlorothiazide (HYDRODIURIL) 25 MG tablet Take 1  tablet (25 mg total) by mouth daily.   losartan (COZAAR) 50 MG tablet Take 1 tablet (50 mg total) by mouth 2 (two) times daily.   pantoprazole (PROTONIX) 40 MG tablet Take 1 tablet (40 mg total) by mouth daily.   sertraline (ZOLOFT) 100 MG tablet Take 1.5 tablets (150 mg total) by mouth daily.   Syringe/Needle, Disp, (SYRINGE 3CC/21GX1-1/4") 21G X 1-1/4" 3 ML MISC 1 each by Does not apply route once Jeff Stewart week.   testosterone cypionate (DEPOTESTOSTERONE CYPIONATE) 200 MG/ML injection INJECT 0.5 MLS INTRAMUSCULARY EVERY 10 DAYS   Turmeric 500 MG CAPS Take by mouth.   [DISCONTINUED] cholecalciferol (VITAMIN D3) 25 MCG (1000 UNIT) tablet Take 1,000 Units by mouth daily.   [DISCONTINUED] testosterone cypionate (DEPOTESTOSTERONE CYPIONATE) 200 MG/ML injection INJECT 0.5 MLS INTRAMUSCULARY EVERY OTHER WEEK   No facility-administered encounter medications on file as of 12/07/2021.   ALLERGIES: Allergies  Allergen Reactions   Nalbuphine Nausea And Vomiting, Rash and Shortness Of Breath   Nubain [Nalbuphine Hcl] Rash   VACCINATION STATUS: Immunization History  Administered Date(s) Administered   Fluad Quad(high Dose 65+) 12/21/2018, 12/22/2020   Influenza Split 01/19/2014   Influenza,inj,Quad PF,6+ Mos 01/14/2015, 02/02/2016, 02/08/2018   Influenza-Unspecified 02/19/2014, 02/24/2020   Moderna Sars-Covid-2 Vaccination 05/12/2019, 05/23/2019, 03/10/2020   Pneumococcal Conjugate-13 08/09/2019   Pneumococcal Polysaccharide-23 04/23/2020   Tdap 12/11/2014   Zoster Recombinat (Shingrix) 04/23/2020, 10/08/2020    HPI Jeff Jeff Stewart is Jeff Stewart 67 year old gentleman with Jeff Stewart medical history as above.  He is being seen in follow-up for hypogonadism on testosterone replacement therapy.   -He remains on testosterone cypionate 100 mg IM every 14 days.  His previsit labs show total testosterone of  135 dropping from 700, 10 days after his last injection.    He reports some fatigue.  No other major interval medical  history.  -He reports better energy level, better mobility. He was known to have low testosterone for at least since age 66.   No evidence of adverse effects from testosterone treatment.  He wishes to be continued on testosterone placement therapy. He fathers 3 grown children. He denies history of head injury , has had nasal septal surgery multiple times. He denies injury to the testicles, exposure to chemotherapy, exposure to radiation to the genitals. However, due to chronic back injury and surgery he has exposure to heavy opioid therapy for pain  control. He is currently on morphine pump. He has been off and  on  opioids for pain control for the last 10-15 years.  He struggles with sleep apnea, even changing CPAP machines did not help much.  Review of Systems Limited as above.  Objective:    BP 112/66   Pulse 60   Ht 6' 1" (1.854 m)   Wt 260 lb 3.2 oz (118 kg)   BMI 34.33 kg/m   Wt Readings from Last 3 Encounters:  12/07/21 260 lb 3.2 oz (118 kg)  10/08/21 258 lb (117 kg)  09/10/21 253 lb (114.8 kg)      From previous exam.  Genitourinary system: He has bilaterally shrunk testicles, right testes 12 mL, left testes 15 mL. No scrotal mass, no varicocele,  no inguinal lymphadenopathy.    Recent Results (from the past 2160 hour(s))  CMP14+EGFR     Status: None   Collection Time: 09/10/21  8:51 AM  Result Value Ref Range   Glucose 92 70 - 99 mg/dL   BUN 16 8 - 27 mg/dL   Creatinine, Ser 0.91 0.76 - 1.27 mg/dL   eGFR 92 >59 mL/min/1.73   BUN/Creatinine Ratio 18 10 - 24   Sodium 140 134 - 144 mmol/L   Potassium 3.7 3.5 - 5.2 mmol/L   Chloride 99 96 - 106 mmol/L   CO2 23 20 - 29 mmol/L   Calcium 9.2 8.6 - 10.2 mg/dL   Total Protein 6.7 6.0 - 8.5 g/dL   Albumin 4.5 3.8 - 4.8 g/dL   Globulin, Total 2.2 1.5 - 4.5 g/dL   Albumin/Globulin Ratio 2.0 1.2 - 2.2   Bilirubin Total 0.5 0.0 - 1.2 mg/dL   Alkaline Phosphatase 93 44 - 121 IU/L   AST 22 0 - 40 IU/L   ALT 21 0 - 44  IU/L  Thyroid Panel With TSH     Status: Abnormal   Collection Time: 09/10/21  8:51 AM  Result Value Ref Range   TSH 3.130 0.450 - 4.500 uIU/mL   T4, Total 8.5 4.5 - 12.0 ug/dL   T3 Uptake Ratio 23 (L) 24 - 39 %   Free Thyroxine Index 2.0 1.2 - 4.9  VITAMIN D 25 Hydroxy (Vit-D Deficiency, Fractures)     Status: Abnormal   Collection Time: 09/10/21  8:51 AM  Result Value Ref Range   Vit D, 25-Hydroxy 19.1 (L) 30.0 - 100.0 ng/mL    Comment: Vitamin D deficiency has been defined by the Institute of Medicine and an Endocrine Society practice guideline as Jeff Stewart level of serum 25-OH vitamin D less than 20 ng/mL (1,2). The Endocrine Society went on to further define vitamin D insufficiency as Jeff Stewart level between 21 and 29 ng/mL (2). 1. IOM (Institute of Medicine). 2010. Dietary reference    intakes for calcium and D. Ray: The    [employer]. 2. Holick MF, Binkley Murray, Bischoff-Ferrari HA, et al.    Evaluation, treatment, and prevention of vitamin D    deficiency: an Endocrine Society clinical practice    guideline. JCEM. 2011 Jul; 96(7):1911-30.   Anemia Profile B     Status: None   Collection Time: 09/10/21  8:51 AM  Result Value Ref Range   Total Iron Binding Capacity 407 250 - 450 ug/dL   UIBC 282 111 - 343 ug/dL   Iron 125 38 - 169 ug/dL   Iron Saturation 31 15 - 55 %   Ferritin 67 30 - 400 ng/mL  Vitamin B-12 275 232 - 1,245 pg/mL   Folate 12.4 >3.0 ng/mL    Comment: Jeff Stewart serum folate concentration of less than 3.1 ng/mL is considered to represent clinical deficiency.    WBC 4.5 3.4 - 10.8 x10E3/uL   RBC 5.28 4.14 - 5.80 x10E6/uL   Hemoglobin 14.7 13.0 - 17.7 g/dL   Hematocrit 44.6 37.5 - 51.0 %   MCV 85 79 - 97 fL   MCH 27.8 26.6 - 33.0 pg   MCHC 33.0 31.5 - 35.7 g/dL   RDW 13.4 11.6 - 15.4 %   Platelets 159 150 - 450 x10E3/uL   Neutrophils 55 Not Estab. %   Lymphs 31 Not Estab. %   Monocytes 9 Not Estab. %   Eos 4 Not Estab. %   Basos 1 Not Estab. %    Neutrophils Absolute 2.5 1.4 - 7.0 x10E3/uL   Lymphocytes Absolute 1.4 0.7 - 3.1 x10E3/uL   Monocytes Absolute 0.4 0.1 - 0.9 x10E3/uL   EOS (ABSOLUTE) 0.2 0.0 - 0.4 x10E3/uL   Basophils Absolute 0.0 0.0 - 0.2 x10E3/uL   Immature Granulocytes 0 Not Estab. %   Immature Grans (Abs) 0.0 0.0 - 0.1 x10E3/uL   Retic Ct Pct 0.9 0.6 - 2.6 %  Lipid panel     Status: Abnormal   Collection Time: 10/08/21  9:08 AM  Result Value Ref Range   Cholesterol, Total 166 100 - 199 mg/dL   Triglycerides 101 0 - 149 mg/dL   HDL 45 >39 mg/dL   VLDL Cholesterol Cal 19 5 - 40 mg/dL   LDL Chol Calc (NIH) 102 (H) 0 - 99 mg/dL   Chol/HDL Ratio 3.7 0.0 - 5.0 ratio    Comment:                                   T. Chol/HDL Ratio                                             Men  Women                               1/2 Avg.Risk  3.4    3.3                                   Avg.Risk  5.0    4.4                                2X Avg.Risk  9.6    7.1                                3X Avg.Risk 23.4   11.0   Testosterone, Free, Total, SHBG     Status: Abnormal   Collection Time: 11/25/21  8:24 AM  Result Value Ref Range   Testosterone 135 (L) 264 - 916 ng/dL    Comment: Adult male reference interval is based on Jeff Stewart population of healthy nonobese males (BMI <30) between 84 and 56 years old. [place], [place] [phone]. PMID: [id number].  Testosterone, Free 2.2 (L) 6.6 - 18.1 pg/mL   Sex Hormone Binding 22.2 19.3 - 76.4 nmol/L  Comprehensive metabolic panel     Status: None   Collection Time: 11/25/21  8:24 AM  Result Value Ref Range   Glucose 92 70 - 99 mg/dL   BUN 22 8 - 27 mg/dL   Creatinine, Ser 0.92 0.76 - 1.27 mg/dL   eGFR 91 >59 mL/min/1.73   BUN/Creatinine Ratio 24 10 - 24   Sodium 141 134 - 144 mmol/L   Potassium 4.3 3.5 - 5.2 mmol/L   Chloride 98 96 - 106 mmol/L   CO2 27 20 - 29 mmol/L   Calcium 9.5 8.6 - 10.2 mg/dL   Total Protein 6.9 6.0 - 8.5 g/dL   Albumin 4.7 3.9 - 4.9 g/dL    Globulin, Total 2.2 1.5 - 4.5 g/dL   Albumin/Globulin Ratio 2.1 1.2 - 2.2   Bilirubin Total 0.5 0.0 - 1.2 mg/dL   Alkaline Phosphatase 96 44 - 121 IU/L   AST 16 0 - 40 IU/L   ALT 19 0 - 44 IU/L     Assessment & Plan:   1. Hypogonadism  2.  Obesity/sleep apnea  3.  Vitamin D deficiency.  -Testosterone replacement helped improve his testosterone from 74 ->286 -> 782 -> 509 > 978 > 659-> 437 >494> 327-> 506-> 640 -> 673 -> 529 -> 446 -> 340 -> 488 ->325 -> 700 -> 135   No adverse events noted. -Gonadotropins levels indicate Jeff Stewart component of secondary hypogonadism given his low normal LH of 1.6 (normal 1.5-9.3) .   -It is possible that it is multifactorial including exposure to heavy opioids, multiple back surgeries, prior inguinal surgery, or idiopathic.  -Prolactin level is favorable at 6.1, he will not need pituitary/sella imaging for now.  -He has benefited from testosterone replacement therapy and he wishes to be continued on treatment.     -He is advised to crease his testosterone to 100 mg IM every 10 days to give him Jeff Stewart total of 300 mg monthly.   target testosterone level 300-600 mcg/dL.   -He we will return in 4-months with total and free testosterone measurements.    Regarding his sleep apnea: He would benefit the most from weight loss. - he acknowledges that there is Jeff Stewart room for improvement in his food and drink choices. - Suggestion is made for him to avoid simple carbohydrates  from his diet including Cakes, Sweet Desserts, Ice Cream, Soda (diet and regular), Sweet Tea, Candies, Chips, Cookies, Store Bought Juices, Alcohol in Excess of  1-2 drinks Jeff Stewart day, Artificial Sweeteners,  Coffee Creamer, and "Sugar-free" Products, Lemonade. This will help patient to have more stable blood glucose profile and potentially avoid unintended weight gain.  He would benefit from continued vitamin D supplement at Jeff Stewart higher dose.  I discussed increasing vitamin D3 to 5000 units daily.  - I advised  patient to maintain close follow up with Jeff Jeff Stewart, Jeff A, MD for primary care needs.    I spent 22 minutes in the care of the patient today including review of labs from Thyroid Function, CMP, and other relevant labs ; imaging/biopsy records (current and previous including abstractions from other facilities); face-to-face time discussing  his lab results and symptoms, medications doses, his options of short and long term treatment based on the latest standards of care / guidelines;   and documenting the encounter.  Jahbari O Vondra  participated in the discussions, expressed understanding, and voiced agreement   with the above plans.  All questions were answered to his satisfaction. he is encouraged to contact clinic should he have any questions or concerns prior to his return visit.   Follow up plan: Return in about 4 months (around 04/08/2022) for Fasting Labs  in AM B4 8.  Gebre Nida, MD Phone: [phone]  Fax: [phone]   This note was partially dictated with voice recognition software. Similar sounding words can be transcribed inadequately or may not  be corrected upon review.  12/07/2021, 7:07 PM 

## 2021-12-21 ENCOUNTER — Other Ambulatory Visit: Payer: Self-pay | Admitting: Family Medicine

## 2021-12-21 DIAGNOSIS — K219 Gastro-esophageal reflux disease without esophagitis: Secondary | ICD-10-CM

## 2021-12-22 DIAGNOSIS — M5136 Other intervertebral disc degeneration, lumbar region: Secondary | ICD-10-CM | POA: Diagnosis not present

## 2021-12-22 DIAGNOSIS — M961 Postlaminectomy syndrome, not elsewhere classified: Secondary | ICD-10-CM | POA: Diagnosis not present

## 2021-12-22 DIAGNOSIS — M542 Cervicalgia: Secondary | ICD-10-CM | POA: Diagnosis not present

## 2021-12-22 DIAGNOSIS — G894 Chronic pain syndrome: Secondary | ICD-10-CM | POA: Diagnosis not present

## 2022-01-07 ENCOUNTER — Ambulatory Visit (INDEPENDENT_AMBULATORY_CARE_PROVIDER_SITE_OTHER): Payer: Medicare Other

## 2022-01-07 DIAGNOSIS — Z23 Encounter for immunization: Secondary | ICD-10-CM | POA: Diagnosis not present

## 2022-02-23 DIAGNOSIS — G894 Chronic pain syndrome: Secondary | ICD-10-CM | POA: Diagnosis not present

## 2022-02-23 DIAGNOSIS — M5136 Other intervertebral disc degeneration, lumbar region: Secondary | ICD-10-CM | POA: Diagnosis not present

## 2022-02-23 DIAGNOSIS — Z5181 Encounter for therapeutic drug level monitoring: Secondary | ICD-10-CM | POA: Diagnosis not present

## 2022-02-23 DIAGNOSIS — Z79899 Other long term (current) drug therapy: Secondary | ICD-10-CM | POA: Diagnosis not present

## 2022-02-23 DIAGNOSIS — M5030 Other cervical disc degeneration, unspecified cervical region: Secondary | ICD-10-CM | POA: Diagnosis not present

## 2022-02-23 DIAGNOSIS — M961 Postlaminectomy syndrome, not elsewhere classified: Secondary | ICD-10-CM | POA: Diagnosis not present

## 2022-04-06 ENCOUNTER — Other Ambulatory Visit: Payer: Medicare Other

## 2022-04-06 DIAGNOSIS — E559 Vitamin D deficiency, unspecified: Secondary | ICD-10-CM | POA: Diagnosis not present

## 2022-04-11 ENCOUNTER — Ambulatory Visit: Payer: Medicare Other | Admitting: "Endocrinology

## 2022-04-15 LAB — TESTOSTERONE, FREE, TOTAL, SHBG
Sex Hormone Binding: 21.4 nmol/L (ref 19.3–76.4)
Testosterone, Free: 4.8 pg/mL — ABNORMAL LOW (ref 6.6–18.1)
Testosterone: 228 ng/dL — ABNORMAL LOW (ref 264–916)

## 2022-04-15 LAB — VITAMIN D 25 HYDROXY (VIT D DEFICIENCY, FRACTURES): Vit D, 25-Hydroxy: 51.4 ng/mL (ref 30.0–100.0)

## 2022-04-15 LAB — PSA: Prostate Specific Ag, Serum: 1 ng/mL (ref 0.0–4.0)

## 2022-04-24 DIAGNOSIS — R059 Cough, unspecified: Secondary | ICD-10-CM | POA: Diagnosis not present

## 2022-04-24 DIAGNOSIS — U071 COVID-19: Secondary | ICD-10-CM | POA: Diagnosis not present

## 2022-04-24 DIAGNOSIS — R0981 Nasal congestion: Secondary | ICD-10-CM | POA: Diagnosis not present

## 2022-05-02 ENCOUNTER — Other Ambulatory Visit: Payer: Self-pay | Admitting: Family Medicine

## 2022-05-02 DIAGNOSIS — I10 Essential (primary) hypertension: Secondary | ICD-10-CM

## 2022-05-02 DIAGNOSIS — G894 Chronic pain syndrome: Secondary | ICD-10-CM | POA: Diagnosis not present

## 2022-05-02 DIAGNOSIS — M961 Postlaminectomy syndrome, not elsewhere classified: Secondary | ICD-10-CM | POA: Diagnosis not present

## 2022-05-10 ENCOUNTER — Ambulatory Visit: Payer: Medicare Other

## 2022-05-10 ENCOUNTER — Encounter: Payer: Self-pay | Admitting: Family Medicine

## 2022-05-10 ENCOUNTER — Ambulatory Visit (INDEPENDENT_AMBULATORY_CARE_PROVIDER_SITE_OTHER): Payer: Medicare Other | Admitting: Family Medicine

## 2022-05-10 VITALS — BP 127/76 | HR 63 | Temp 98.3°F | Ht 73.0 in | Wt 264.1 lb

## 2022-05-10 DIAGNOSIS — U099 Post covid-19 condition, unspecified: Secondary | ICD-10-CM

## 2022-05-10 DIAGNOSIS — R053 Chronic cough: Secondary | ICD-10-CM | POA: Diagnosis not present

## 2022-05-10 DIAGNOSIS — B9689 Other specified bacterial agents as the cause of diseases classified elsewhere: Secondary | ICD-10-CM | POA: Diagnosis not present

## 2022-05-10 DIAGNOSIS — J019 Acute sinusitis, unspecified: Secondary | ICD-10-CM

## 2022-05-10 MED ORDER — BENZONATATE 100 MG PO CAPS: 100.0000 mg | ORAL_CAPSULE | Freq: Three times a day (TID) | ORAL | 0 refills | Status: DC | PRN

## 2022-05-10 MED ORDER — AMOXICILLIN-POT CLAVULANATE 875-125 MG PO TABS: 1.0000 | ORAL_TABLET | Freq: Two times a day (BID) | ORAL | 0 refills | Status: DC

## 2022-05-10 NOTE — Progress Notes (Signed)
Subjective: CC: Cough PCP: Dettinger, Fransisca Kaufmann, MD [id number] Jeff Stewart is a 67 y.o. male presenting to clinic today for:  1.  Cough Patient reports ongoing cough that seems to be related to postnasal drainage.  He has got some thick mucus that is discolored happening.  This is the first day that he has not felt any better since he had COVID that was diagnosed 04/24/2022.  He is status post treatment Paxlovid.  Ongoing sinus pressure, no fevers.  No hemoptysis.  No shortness of breath or wheezing.  Compliant with Zyrtec and nasal spray   ROS: Per HPI  Allergies  Allergen Reactions   Nalbuphine Nausea And Vomiting, Rash and Shortness Of Breath   Nubain [Nalbuphine Hcl] Rash   Past Medical History:  Diagnosis Date   Anxiety    Carpal tunnel syndrome, bilateral    Cataract    removed years ago   Chronic back pain    internal morphine pump   DDD (degenerative disc disease)    neck, lumbar   Depression    Dyslipidemia    diet controlled   GERD (gastroesophageal reflux disease)    past hx- had nissen fundiplication    Hypertension    borderline   Sleep apnea    wears C-PAP   Status post insertion of spinal cord stimulator     Current Outpatient Medications:    acetaminophen (TYLENOL) 500 MG tablet, Take 500 mg by mouth every 6 (six) hours as needed., Disp: , Rfl:    cetirizine (ZYRTEC) 10 MG tablet, Take 1 tablet (10 mg total) by mouth daily., Disp: 90 tablet, Rfl: 3   Cholecalciferol (VITAMIN D3) 125 MCG (5000 UT) CAPS, Take 5,000 Units by mouth daily with breakfast., Disp: , Rfl:    fluticasone (FLONASE) 50 MCG/ACT nasal spray, Place 2 sprays into both nostrils daily., Disp: 16 g, Rfl: 11   furosemide (LASIX) 40 MG tablet, Take 1 tablet (40 mg total) by mouth every morning. (NEEDS TO BE SEEN BEFORE NEXT REFILL), Disp: 30 tablet, Rfl: 0   hydrochlorothiazide (HYDRODIURIL) 25 MG tablet, Take 1 tablet (25 mg total) by mouth daily. (NEEDS TO BE SEEN BEFORE NEXT REFILL), Disp: 30  tablet, Rfl: 0   losartan (COZAAR) 50 MG tablet, Take 1 tablet (50 mg total) by mouth 2 (two) times daily., Disp: 180 tablet, Rfl: 3   pantoprazole (PROTONIX) 40 MG tablet, TAKE 1 TABLET DAILY, Disp: 30 tablet, Rfl: 2   sertraline (ZOLOFT) 100 MG tablet, Take 1.5 tablets (150 mg total) by mouth daily., Disp: 135 tablet, Rfl: 3   Syringe/Needle, Disp, (SYRINGE 3CC/21GX1-1/4") 21G X 1-1/4" 3 ML MISC, 1 each by Does not apply route once a week., Disp: 100 each, Rfl: 0   testosterone cypionate (DEPOTESTOSTERONE CYPIONATE) 200 MG/ML injection, INJECT 0.5 MLS INTRAMUSCULARY EVERY 10 DAYS, Disp: 10 mL, Rfl: 0   Turmeric 500 MG CAPS, Take by mouth., Disp: , Rfl:  Social History   Socioeconomic History   Marital status: Married    Spouse name: Not on file   Number of children: 3   Years of education: Not on file   Highest education level: Not on file  Occupational History   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  Tobacco Use   Smoking status: Never   Smokeless tobacco: Never  Vaping Use   Vaping Use: Never used  Substance and Sexual Activity   Alcohol use: No   Drug use: No   Sexual activity: Not Currently  Birth control/protection: Post-menopausal    Comment: married for 1972  Other Topics Concern   Not on file  Social History Narrative   Lives with wife    Children live nearby   Social Determinants of Health   Financial Resource Strain: Low Risk  (07/22/2021)   Overall Financial Resource Strain (CARDIA)    Difficulty of Paying Living Expenses: Not hard at all  Food Insecurity: No Food Insecurity (07/22/2021)   Hunger Vital Sign    Worried About Running Out of Food in the Last Year: Never true    [place] in the Last Year: Never true  Transportation Needs: No Transportation Needs (07/22/2021)   PRAPARE - Hydrologist (Medical): No    Lack of Transportation (Non-Medical): No  Physical Activity: Inactive (07/22/2021)   Exercise Vital Sign     Days of Exercise per Week: 0 days    Minutes of Exercise per Session: 0 min  Stress: No Stress Concern Present (07/22/2021)   [place]    Feeling of Stress : Only a little  Social Connections: Socially Integrated (07/22/2021)   Social Connection and Isolation Panel [NHANES]    Frequency of Communication with Friends and Family: More than three times a week    Frequency of Social Gatherings with Friends and Family: More than three times a week    Attends Religious Services: More than 4 times per year    Active Member of [employer] or Organizations: Yes    Attends Music therapist: More than 4 times per year    Marital Status: Married  Human resources officer Violence: Not At Risk (07/22/2021)   Humiliation, Afraid, Rape, and Kick questionnaire    Fear of Current or Ex-Partner: No    Emotionally Abused: No    Physically Abused: No    Sexually Abused: No   Family History  Problem Relation Age of Onset   CAD Father 100   Emphysema Father    Stroke Mother 53   CAD Brother 63       CABG   Alcohol abuse Brother    Hypertension Brother    Stroke Brother    CAD Brother    Cirrhosis Brother    Alcohol abuse Brother    Hypertension Brother    CAD Sister    Hypertension Sister    Hyperlipidemia Sister    Cancer Sister        melanoma   Cancer Sister    Lung cancer Sister    Cancer Brother    Lung cancer Brother    Cancer Brother    Lung cancer Brother    Colon cancer Neg Hx    Colon polyps Neg Hx    Esophageal cancer Neg Hx    Rectal cancer Neg Hx    Stomach cancer Neg Hx     Objective: Office vital signs reviewed. BP 127/76   Pulse 63   Temp 98.3 F (36.8 C) (Temporal)   Ht 6' 1"$  (1.854 m)   Wt 264 lb 2 oz (119.8 kg)   SpO2 96%   BMI 34.85 kg/m   Physical Examination:  General: Awake, alert, well nourished, No acute distress HEENT: Normal.  No facial or sinus tenderness    Neck: No masses  palpated. No lymphadenopathy    Ears: Tympanic membranes intact, normal light reflex, no erythema, no bulging    Eyes: PERRLA, extraocular membranes intact, sclera white  Nose: nasal turbinates moist, opaque nasal discharge    Throat: moist mucus membranes, no erythema, no tonsillar exudate.  Airway is patent Cardio: regular rate and rhythm, S1S2 heard, no murmurs appreciated Pulm: clear to auscultation bilaterally, no wheezes, rhonchi or rales; normal work of breathing on room air    Assessment/ Plan: 67 y.o. 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  Start Augmentin.  Tessalon Perles sent if needed for cough.  I canceled chest x-ray as he really had an unremarkable lung exam today.  Home care instructions reviewed and reasons for reevaluation discussed.  Continue current regimen with allergy medications.  No orders of the defined types were placed in this encounter.  No orders of the defined types were placed in this encounter.    Janora Norlander, DO [place] [phone]

## 2022-05-10 NOTE — Patient Instructions (Signed)

## 2022-05-19 ENCOUNTER — Encounter: Payer: Self-pay | Admitting: "Endocrinology

## 2022-05-19 ENCOUNTER — Ambulatory Visit: Payer: Medicare Other | Admitting: "Endocrinology

## 2022-05-19 VITALS — BP 114/72 | HR 92 | Ht 73.0 in | Wt 261.8 lb

## 2022-05-19 DIAGNOSIS — E559 Vitamin D deficiency, unspecified: Secondary | ICD-10-CM | POA: Diagnosis not present

## 2022-05-19 DIAGNOSIS — E291 Testicular hypofunction: Secondary | ICD-10-CM

## 2022-05-19 MED ORDER — TESTOSTERONE CYPIONATE 200 MG/ML IM SOLN: INTRAMUSCULAR | 1 refills | Status: DC

## 2022-05-19 NOTE — Progress Notes (Signed)
05/19/2022                     Endocrinology follow-up note   Subjective:    Patient ID: Jeff Stewart, male    DOB: 30-Nov-1955, PCP Jeff Stewart, Jeff Kaufmann, MD   Past Medical History:  Diagnosis Date   Anxiety    Carpal tunnel syndrome, bilateral    Cataract    removed years ago   Chronic back pain    internal morphine pump   DDD (degenerative disc disease)    neck, lumbar   Depression    Dyslipidemia    diet controlled   GERD (gastroesophageal reflux disease)    past hx- had nissen fundiplication    Hypertension    borderline   Sleep apnea    wears C-PAP   Status post insertion of spinal cord stimulator    Past Surgical History:  Procedure Laterality Date   BACK SURGERY     x 6   CARPAL TUNNEL RELEASE     bilateral   COLONOSCOPY     ELBOW SURGERY     INGUINAL HERNIA REPAIR Right    INTERNAL MORPHINE PUMP      KNEE ARTHROSCOPY     NASAL SEPTUM SURGERY     x 6   neck fusion      NISSEN FUNDOPLICATION     SKIN CANCER EXCISION     SPINAL CORD STIMULATOR INSERTION     STOMACH SURGERY     Nissen Fundiplication   thumb surgery     TOTAL KNEE ARTHROPLASTY Left 04/24/2018   Procedure: TOTAL KNEE ARTHROPLASTY;  Surgeon: Renette Butters, MD;  Location: [facility];  Service: Orthopedics;  Laterality: Left;   UPPER GASTROINTESTINAL ENDOSCOPY     Social History   Socioeconomic History   Marital status: Married    Spouse name: Not on file   Number of children: 3   Years of education: Not on file   Highest education level: Not on file  Occupational History   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  Tobacco Use   Smoking status: Never   Smokeless tobacco: Never  Vaping Use   Vaping Use: Never used  Substance and Sexual Activity   Alcohol use: No   Drug use: No   Sexual activity: Not Currently    Birth control/protection: Post-menopausal    Comment: married for 1972  Other Topics Concern   Not on file  Social History Narrative   Lives with wife     Children live nearby   Social Determinants of Health   Financial Resource Strain: [place]  (07/22/2021)   Overall Financial Resource Strain (CARDIA)    Difficulty of Paying Living Expenses: Not hard at all  Food Insecurity: No [place] (07/22/2021)   Hunger Vital Sign    Worried About Running Out of Food in the Last Year: Never true    Jeff Stewart in the Last Year: Never true  Transportation Needs: No Transportation Needs (07/22/2021)   PRAPARE - Hydrologist (Medical): No    Lack of Transportation (Non-Medical): No  Physical Activity: Inactive (07/22/2021)   Exercise Vital Sign    Days of Exercise per Week: 0 days    Minutes of Exercise per Session: 0 min  Stress: No Stress Concern Present (07/22/2021)   [place]    Feeling of Stress : Only a little  Social Connections: Socially Integrated (07/22/2021)   Social Connection and Isolation Panel [NHANES]    Frequency of Communication with Friends and Family: More than three times a week    Frequency of Social Gatherings with Friends and Family: More than three times a week    Attends Religious Services: More than 4 times per year    Active Member of [employer] or Organizations: Yes    Attends Archivist Meetings: More than 4 times per year    Marital Status: Married   Outpatient Encounter Medications as of 05/19/2022  Medication Sig   acetaminophen (TYLENOL) 500 MG tablet Take 500 mg by mouth every 6 (six) hours as needed.   amoxicillin-clavulanate (AUGMENTIN) 875-125 MG tablet Take 1 tablet by mouth 2 (two) times daily.   benzonatate (TESSALON PERLES) 100 MG capsule Take 1 capsule (100 mg total) by mouth 3 (three) times daily as needed for cough.   cetirizine (ZYRTEC) 10 MG tablet Take 1 tablet (10 mg total) by mouth daily.   Cholecalciferol (VITAMIN D3) 125 MCG (5000 UT) CAPS Take 5,000 Units by mouth daily with breakfast.    fluticasone (FLONASE) 50 MCG/ACT nasal spray Place 2 sprays into both nostrils daily.   furosemide (LASIX) 40 MG tablet Take 1 tablet (40 mg total) by mouth every morning. (NEEDS TO BE SEEN BEFORE NEXT REFILL)   hydrochlorothiazide (HYDRODIURIL) 25 MG tablet Take 1 tablet (25 mg total) by mouth daily. (NEEDS TO BE SEEN BEFORE NEXT REFILL)   losartan (COZAAR) 50 MG tablet Take 1 tablet (50 mg total) by mouth 2 (two) times daily.   pantoprazole (PROTONIX) 40 MG tablet TAKE 1 TABLET DAILY   sertraline (ZOLOFT) 100 MG tablet Take 1.5 tablets (150 mg total) by mouth daily.   Syringe/Needle, Disp, (SYRINGE 3CC/21GX1-1/4") 21G X 1-1/4" 3 ML MISC 1 each by Does not apply route once a week.   testosterone cypionate (DEPOTESTOSTERONE CYPIONATE) 200 MG/ML injection INJECT 0.5 MLS INTRAMUSCULARY EVERY 7 DAYS   Turmeric 500 MG CAPS Take by mouth.   [DISCONTINUED] testosterone cypionate (DEPOTESTOSTERONE CYPIONATE) 200 MG/ML injection INJECT 0.5 MLS INTRAMUSCULARY EVERY 10 DAYS   No facility-administered encounter medications on file as of 05/19/2022.   ALLERGIES: Allergies  Allergen Reactions   Nalbuphine Nausea And Vomiting, Rash and Shortness Of Breath   Nubain [Nalbuphine Hcl] Rash   VACCINATION STATUS: Immunization History  Administered Date(s) Administered   Fluad Quad(high Dose 65+) 12/21/2018, 12/22/2020, 01/07/2022   Influenza Split 01/19/2014   Influenza,inj,Quad PF,6+ Mos 01/14/2015, 02/02/2016, 02/08/2018   Influenza-Unspecified 02/19/2014, 02/24/2020   Moderna Sars-Covid-2 Vaccination 05/12/2019, 05/23/2019, 03/10/2020   Pneumococcal Conjugate-13 08/09/2019   Pneumococcal Polysaccharide-23 04/23/2020   Tdap 12/11/2014   Zoster Recombinat (Shingrix) 04/23/2020, 10/08/2020    HPI Jeff Stewart is a 67 year old gentleman with a medical history as above.  He is being seen in follow-up for hypogonadism on testosterone replacement therapy.   -He remains on testosterone cypionate .  His  previsit labs show total testosterone of  228 on testosterone 100 mg every 10 days.    He reports some fatigue.  No other major interval medical history.  -He reports better energy level, better mobility. He was known to have low testosterone for at least since age 67.   No evidence of adverse effects from testosterone treatment.  He wishes to be continued on testosterone placement therapy. He fathers 3 grown children. He denies history of head injury , has had nasal septal surgery multiple times. He denies injury to the testicles,  exposure to chemotherapy, exposure to radiation to the genitals. However, due to chronic back injury and surgery he has exposure to heavy opioid therapy for pain control. He is currently on morphine pump. He has been off and  on  opioids for pain control for the last 10-15 years.  He struggles with sleep apnea, even changing CPAP machines did not help much.  Review of Systems Limited as above.  Objective:    BP 114/72   Pulse 92   Ht '6\' 1"'$  (1.854 m)   Wt 261 lb 12.8 oz (118.8 kg)   BMI 34.54 kg/m   Wt Readings from Last 3 Encounters:  05/19/22 261 lb 12.8 oz (118.8 kg)  05/10/22 264 lb 2 oz (119.8 kg)  12/07/21 260 lb 3.2 oz (118 kg)      From previous exam.  Genitourinary system: He has bilaterally shrunk testicles, right testes 12 mL, left testes 15 mL. No scrotal mass, no varicocele,  no inguinal lymphadenopathy.    Recent Results (from the past 2160 hour(s))  Testosterone, Free, Total, SHBG     Status: Abnormal   Collection Time: 04/06/22  9:55 AM  Result Value Ref Range   Testosterone 228 (L) 264 - 916 ng/dL    Comment: Adult male reference interval is based on a population of healthy nonobese males (BMI <30) between 71 and 41 years old. [place], [place] [phone]. PMID: [id number].    Testosterone, Free 4.8 (L) 6.6 - 18.1 pg/mL   Sex Hormone Binding 21.4 19.3 - 76.4 nmol/L  VITAMIN D 25 Hydroxy (Vit-D Deficiency, Fractures)      Status: None   Collection Time: 04/06/22  9:55 AM  Result Value Ref Range   Vit D, 25-Hydroxy 51.4 30.0 - 100.0 ng/mL    Comment: Vitamin D deficiency has been defined by the [place] practice guideline as a level of serum 25-OH vitamin D less than 20 ng/mL (1,2). The Endocrine Society went on to further define vitamin D insufficiency as a level between 21 and 29 ng/mL (2). 1. IOM (Institute of Medicine). 2010. Dietary reference    intakes for calcium and D. [place]: The    [employer]. 2. Holick MF, Binkley [place], Bischoff-Ferrari HA, et al.    Evaluation, treatment, and prevention of vitamin D    deficiency: an Endocrine Society clinical practice    guideline. JCEM. 2011 Jul; 96(7):1911-30.   PSA     Status: None   Collection Time: 04/06/22  9:55 AM  Result Value Ref Range   Prostate Specific Ag, Serum 1.0 0.0 - 4.0 ng/mL    Comment: Roche ECLIA methodology. According to the American Urological Association, Serum PSA should decrease and remain at undetectable levels after radical prostatectomy. The AUA defines biochemical recurrence as an initial PSA value 0.2 ng/mL or greater followed by a subsequent confirmatory PSA value 0.2 ng/mL or greater. Values obtained with different assay methods or kits cannot be used interchangeably. Results cannot be interpreted as absolute evidence of the presence or absence of malignant disease.      Assessment & Plan:   1. Hypogonadism  2.  Obesity/sleep apnea  3.  Vitamin D deficiency.  -Testosterone replacement helped improve his testosterone from 74 ->286 -> 782 -> 509 > 978 > 659-> 437 >494> 327-> 506-> 640 -> 673 -> 529 -> 446 -> 340 -> 488 ->325 -> 700 -> 135 -> 228   No adverse events noted. -Gonadotropins levels indicate a component  of secondary hypogonadism given his low normal LH of 1.6 (normal 1.5-9.3) .   -It is possible that it is multifactorial including exposure to heavy  opioids, multiple back surgeries, prior inguinal surgery, or idiopathic.  -Prolactin level is favorable at 6.1, he will not need pituitary/sella imaging for now.  -He has benefited from testosterone replacement therapy and he wishes to be continued on treatment.     -He is advised to crease his testosterone to 100 mg IM every 7 days to give him a total of 400 mg monthly.   target testosterone level 300-600 mcg/dL.   -He we will return in 96-month with total and free testosterone measurements.    Regarding his sleep apnea: He would benefit the most from weight loss. - he acknowledges that there is a room for improvement in his food and drink choices. - Suggestion is made for him to avoid simple carbohydrates  from his diet including Cakes, Sweet Desserts, Ice Cream, Soda (diet and regular), Sweet Tea, Candies, Chips, Cookies, Store Bought Juices, Alcohol in Excess of  1-2 drinks a day, Artificial Sweeteners,  Coffee Creamer, and "Sugar-free" Products, Lemonade. This will help patient to have more stable blood glucose profile and potentially avoid unintended weight gain.  He would benefit from continued vitamin D supplement at a higher dose.  I discussed increasing vitamin D3 to 5000 units daily.  - I advised patient to maintain close follow up with Jeff Stewart, JFransisca Kaufmann MD for primary care needs.   I spent  21  minutes in the care of the patient today including review of labs from Thyroid Function, CMP, and other relevant labs ; imaging/biopsy records (current and previous including abstractions from other facilities); face-to-face time discussing  his lab results and symptoms, medications doses, his options of short and long term treatment based on the latest standards of care / guidelines;   and documenting the encounter.  TLevonne SpillerDalton  participated in the discussions, expressed understanding, and voiced agreement with the above plans.  All questions were answered to his satisfaction. he is  encouraged to contact clinic should he have any questions or concerns prior to his return visit.   Follow up plan: Return in about 4 months (around 09/17/2022) for Fasting Labs  in AM B4 8.  GGlade Lloyd MD Phone: [phone] Fax: [phone]  This note was partially dictated with voice recognition software. Similar sounding words can be transcribed inadequately or may not  be corrected upon review.  05/19/2022, 12:56 PM

## 2022-06-02 ENCOUNTER — Other Ambulatory Visit: Payer: Self-pay | Admitting: Family Medicine

## 2022-06-02 DIAGNOSIS — I10 Essential (primary) hypertension: Secondary | ICD-10-CM

## 2022-06-02 DIAGNOSIS — K219 Gastro-esophageal reflux disease without esophagitis: Secondary | ICD-10-CM

## 2022-06-10 ENCOUNTER — Other Ambulatory Visit: Payer: Self-pay | Admitting: Family Medicine

## 2022-06-10 DIAGNOSIS — K219 Gastro-esophageal reflux disease without esophagitis: Secondary | ICD-10-CM

## 2022-06-20 ENCOUNTER — Other Ambulatory Visit: Payer: Self-pay | Admitting: Family Medicine

## 2022-06-20 DIAGNOSIS — F32A Anxiety disorder, unspecified: Secondary | ICD-10-CM

## 2022-06-21 ENCOUNTER — Other Ambulatory Visit: Payer: Self-pay | Admitting: Family Medicine

## 2022-06-21 DIAGNOSIS — K219 Gastro-esophageal reflux disease without esophagitis: Secondary | ICD-10-CM

## 2022-06-29 DIAGNOSIS — M961 Postlaminectomy syndrome, not elsewhere classified: Secondary | ICD-10-CM | POA: Diagnosis not present

## 2022-06-29 DIAGNOSIS — M5030 Other cervical disc degeneration, unspecified cervical region: Secondary | ICD-10-CM | POA: Diagnosis not present

## 2022-06-29 DIAGNOSIS — Z978 Presence of other specified devices: Secondary | ICD-10-CM | POA: Diagnosis not present

## 2022-06-29 DIAGNOSIS — G894 Chronic pain syndrome: Secondary | ICD-10-CM | POA: Diagnosis not present

## 2022-06-29 DIAGNOSIS — M5136 Other intervertebral disc degeneration, lumbar region: Secondary | ICD-10-CM | POA: Diagnosis not present

## 2022-07-04 ENCOUNTER — Telehealth: Payer: Self-pay | Admitting: Family Medicine

## 2022-07-04 DIAGNOSIS — I10 Essential (primary) hypertension: Secondary | ICD-10-CM

## 2022-07-05 MED ORDER — FUROSEMIDE 40 MG PO TABS: 40.0000 mg | ORAL_TABLET | Freq: Every morning | ORAL | 0 refills | Status: DC

## 2022-07-05 MED ORDER — HYDROCHLOROTHIAZIDE 25 MG PO TABS: ORAL_TABLET | ORAL | 0 refills | Status: DC

## 2022-07-05 MED ORDER — LOSARTAN POTASSIUM 50 MG PO TABS: 50.0000 mg | ORAL_TABLET | Freq: Two times a day (BID) | ORAL | 3 refills | Status: DC

## 2022-07-05 NOTE — Telephone Encounter (Signed)
LMOVM refills sent to pharmacy until appt

## 2022-07-05 NOTE — Addendum Note (Signed)
Addended by: Julious Payer D on: 07/05/2022 09:51 AM   Modules accepted: Orders

## 2022-07-05 NOTE — Telephone Encounter (Signed)
Appt was made, refills sent

## 2022-07-13 ENCOUNTER — Telehealth: Payer: Self-pay | Admitting: Family Medicine

## 2022-07-13 NOTE — Telephone Encounter (Signed)
Called patient to schedule Medicare Annual Wellness Visit (AWV).  Left message for patient to call back and schedule Medicare Annual Wellness Visit (AWV).  Last date of AWV: 07/22/2021   Please schedule an appointment at any time with Vernona Rieger, [facility]. .  If any questions, please contact me at [phone].  Thank you,  Judeth Cornfield,  AMB Clinical Support [facility] AWV Program Direct Dial ??[phone]

## 2022-07-14 ENCOUNTER — Telehealth: Payer: Self-pay | Admitting: Family Medicine

## 2022-07-14 NOTE — Telephone Encounter (Signed)
Contacted Jeff Stewart to schedule their annual wellness visit. Appointment made for 08/04/2022.  Thank you,  Jeff Stewart,  AMB Clinical Support [facility] AWV Program Direct Dial ??[phone]

## 2022-07-15 ENCOUNTER — Other Ambulatory Visit: Payer: Self-pay | Admitting: Family Medicine

## 2022-07-15 DIAGNOSIS — K219 Gastro-esophageal reflux disease without esophagitis: Secondary | ICD-10-CM

## 2022-07-15 MED ORDER — PANTOPRAZOLE SODIUM 40 MG PO TBEC: 40.0000 mg | DELAYED_RELEASE_TABLET | Freq: Every day | ORAL | 0 refills | Status: DC

## 2022-08-03 ENCOUNTER — Other Ambulatory Visit: Payer: Self-pay | Admitting: Family Medicine

## 2022-08-03 DIAGNOSIS — F419 Anxiety disorder, unspecified: Secondary | ICD-10-CM

## 2022-08-04 ENCOUNTER — Other Ambulatory Visit: Payer: Self-pay | Admitting: Family Medicine

## 2022-08-04 ENCOUNTER — Ambulatory Visit (INDEPENDENT_AMBULATORY_CARE_PROVIDER_SITE_OTHER): Payer: Medicare Other

## 2022-08-04 VITALS — Ht 73.0 in | Wt 258.0 lb

## 2022-08-04 DIAGNOSIS — Z0000 Encounter for general adult medical examination without abnormal findings: Secondary | ICD-10-CM

## 2022-08-04 DIAGNOSIS — K219 Gastro-esophageal reflux disease without esophagitis: Secondary | ICD-10-CM

## 2022-08-04 NOTE — Progress Notes (Signed)
Subjective:   Jeff Stewart is a 67 y.o. male who presents for Medicare Annual/Subsequent preventive examination. I connected with  Jeff Stewart on 08/04/22 by a audio enabled telemedicine application and verified that I am speaking with the correct person using two identifiers.  Patient Location: Home  Provider Location: Home Office  I discussed the limitations of evaluation and management by telemedicine. The patient expressed understanding and agreed to proceed.  Review of Systems     Cardiac Risk Factors include: advanced age (>69men, >20 women);hypertension;male gender;dyslipidemia     Objective:    Today's Vitals   08/04/22 0820  Weight: 258 lb (117 kg)  Height: 6\' 1"  (1.854 m)   Body mass index is 34.04 kg/m.     08/04/2022    8:23 AM 07/22/2021    3:45 PM 07/07/2020    2:33 PM 05/09/2018    1:39 PM 04/24/2018    6:20 PM 04/24/2018   10:33 AM 04/17/2018    9:09 AM  Advanced Directives  Does Patient Have a Medical Advance Directive? Yes Yes No No No No No  Type of Estate agent of Agency;Living will Healthcare Power of [place];Living will       Copy of Healthcare Power of Attorney in Chart? No - copy requested No - copy requested       Would patient like information on creating a medical advance directive?   No - Patient declined  No - Patient declined No - Patient declined No - Patient declined    Current Medications (verified) Outpatient Encounter Medications as of 08/04/2022  Medication Sig   acetaminophen (TYLENOL) 500 MG tablet Take 500 mg by mouth every 6 (six) hours as needed.   cetirizine (ZYRTEC) 10 MG tablet Take 1 tablet (10 mg total) by mouth daily.   Cholecalciferol (VITAMIN D3) 125 MCG (5000 UT) CAPS Take 5,000 Units by mouth daily with breakfast.   fluticasone (FLONASE) 50 MCG/ACT nasal spray Place 2 sprays into both nostrils daily.   furosemide (LASIX) 40 MG tablet Take 1 tablet (40 mg total) by mouth every morning.    hydrochlorothiazide (HYDRODIURIL) 25 MG tablet TAKE 1 TABLET DAILY.   losartan (COZAAR) 50 MG tablet Take 1 tablet (50 mg total) by mouth 2 (two) times daily.   pantoprazole (PROTONIX) 40 MG tablet Take 1 tablet (40 mg total) by mouth daily. (NEEDS TO BE SEEN BEFORE NEXT REFILL)   sertraline (ZOLOFT) 100 MG tablet Take 1.5 tablets (150 mg total) by mouth daily.   Syringe/Needle, Disp, (SYRINGE 3CC/21GX1-1/4") 21G X 1-1/4" 3 ML MISC 1 each by Does not apply route once a week.   testosterone cypionate (DEPOTESTOSTERONE CYPIONATE) 200 MG/ML injection INJECT 0.5 MLS INTRAMUSCULARY EVERY 7 DAYS   Turmeric 500 MG CAPS Take by mouth.   amoxicillin-clavulanate (AUGMENTIN) 875-125 MG tablet Take 1 tablet by mouth 2 (two) times daily. (Patient not taking: Reported on 08/04/2022)   benzonatate (TESSALON PERLES) 100 MG capsule Take 1 capsule (100 mg total) by mouth 3 (three) times daily as needed for cough. (Patient not taking: Reported on 08/04/2022)   No facility-administered encounter medications on file as of 08/04/2022.    Allergies (verified) Nalbuphine and Nubain [nalbuphine hcl]   History: Past Medical History:  Diagnosis Date   Anxiety    Carpal tunnel syndrome, bilateral    Cataract    removed years ago   Chronic back pain    internal morphine pump   DDD (degenerative disc disease)  neck, lumbar   Depression    Dyslipidemia    diet controlled   GERD (gastroesophageal reflux disease)    past hx- had nissen fundiplication    Hypertension    borderline   Sleep apnea    wears C-PAP   Status post insertion of spinal cord stimulator    Past Surgical History:  Procedure Laterality Date   BACK SURGERY     x 6   CARPAL TUNNEL RELEASE     bilateral   COLONOSCOPY     ELBOW SURGERY     INGUINAL HERNIA REPAIR Right    INTERNAL MORPHINE PUMP      KNEE ARTHROSCOPY     NASAL SEPTUM SURGERY     x 6   neck fusion      NISSEN FUNDOPLICATION     SKIN CANCER EXCISION     SPINAL CORD  STIMULATOR INSERTION     STOMACH SURGERY     Nissen Fundiplication   thumb surgery     TOTAL KNEE ARTHROPLASTY Left 04/24/2018   Procedure: TOTAL KNEE ARTHROPLASTY;  Surgeon: Sheral Apley, MD;  Location: [facility];  Service: Orthopedics;  Laterality: Left;   UPPER GASTROINTESTINAL ENDOSCOPY     Family History  Problem Relation Age of Onset   CAD Father 47   Emphysema Father    Stroke Mother 6   CAD Brother 16       CABG   Alcohol abuse Brother    Hypertension Brother    Stroke Brother    CAD Brother    Cirrhosis Brother    Alcohol abuse Brother    Hypertension Brother    CAD Sister    Hypertension Sister    Hyperlipidemia Sister    Cancer Sister        melanoma   Cancer Sister    Lung cancer Sister    Cancer Brother    Lung cancer Brother    Cancer Brother    Lung cancer Brother    Colon cancer Neg Hx    Colon polyps Neg Hx    Esophageal cancer Neg Hx    Rectal cancer Neg Hx    Stomach cancer Neg Hx    Social History   Socioeconomic History   Marital status: Married    Spouse name: Not on file   Number of children: 3   Years of education: Not on file   Highest education level: Not on file  Occupational History   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  Tobacco Use   Smoking status: Never   Smokeless tobacco: Never  Vaping Use   Vaping Use: Never used  Substance and Sexual Activity   Alcohol use: No   Drug use: No   Sexual activity: Not Currently    Birth control/protection: Post-menopausal    Comment: married for 1972  Other Topics Concern   Not on file  Social History Narrative   Lives with wife    Children live nearby   Social Determinants of Health   Financial Resource Strain: Low Risk  (08/04/2022)   Overall Financial Resource Strain (CARDIA)    Difficulty of Paying Living Expenses: Not hard at all  Food Insecurity: No Food Insecurity (08/04/2022)   Hunger Vital Sign    Worried About Running Out of Food in the Last Year: Never  true    Ran Out of Food in the Last Year: Never true  Transportation Needs: No Transportation Needs (08/04/2022)   PRAPARE -  Administrator, Civil Service (Medical): No    Lack of Transportation (Non-Medical): No  Physical Activity: Insufficiently Active (08/04/2022)   Exercise Vital Sign    Days of Exercise per Week: 3 days    Minutes of Exercise per Session: 30 min  Stress: No Stress Concern Present (08/04/2022)   [employer] of Occupational Health - Occupational Stress Questionnaire    Feeling of Stress : Not at all  Social Connections: Moderately Integrated (08/04/2022)   Social Connection and Isolation Panel [NHANES]    Frequency of Communication with Friends and Family: More than three times a week    Frequency of Social Gatherings with Friends and Family: More than three times a week    Attends Religious Services: More than 4 times per year    Active Member of [employer] or Organizations: No    Attends Engineer, structural: Never    Marital Status: Married    Tobacco Counseling Counseling given: Not Answered   Clinical Intake:  Pre-visit preparation completed: Yes  Pain : No/denies pain     Nutritional Risks: None Diabetes: No  How often do you need to have someone help you when you read instructions, pamphlets, or other written materials from your doctor or pharmacy?: 1 - Never  Diabetic?no   Interpreter Needed?: No  Information entered by :: Renie Ora, LPN   Activities of Daily Living    08/04/2022    8:23 AM  In your present state of health, do you have any difficulty performing the following activities:  Hearing? 0  Vision? 0  Difficulty concentrating or making decisions? 0  Walking or climbing stairs? 0  Dressing or bathing? 0  Doing errands, shopping? 0  Preparing Food and eating ? N  Using the Toilet? N  In the past six months, have you accidently leaked urine? N  Do you have problems with loss of bowel control? N   Managing your Medications? N  Managing your Finances? N  Housekeeping or managing your Housekeeping? N    Patient Care Team: Dettinger, Elige Radon, MD as PCP - General (Family Medicine) Cannon Kettle, MD as Referring Physician (Pain Medicine) Smitty Cords, OD (Optometry) Bradly Bienenstock, MD as Consulting Physician (Orthopedic Surgery) [facility] (Orthopedic Surgery) Meryl Dare, MD as Consulting Physician (Gastroenterology) Roma Kayser, MD as Consulting Physician (Endocrinology)  Indicate any recent Medical Services you may have received from other than [facility] providers in the past year (date may be approximate).     Assessment:   This is a routine wellness examination for Matheson.  Hearing/Vision screen Vision Screening - Comments:: Wears rx glasses - up to date with routine eye exams with  Dr.Martin   Dietary issues and exercise activities discussed: Current Exercise Habits: Home exercise routine, Type of exercise: walking, Time (Minutes): 30, Frequency (Times/Week): 3, Weekly Exercise (Minutes/Week): 90, Intensity: Mild, Exercise limited by: None identified   Goals Addressed             This Visit's Progress    Increase physical activity   On track    Try to walk every day and drink 3 bottles of water daily       Depression Screen    08/04/2022    8:22 AM 05/10/2022   12:17 PM 10/08/2021    8:03 AM 09/10/2021    8:28 AM 07/22/2021    3:46 PM 06/17/2021    9:19 AM 04/12/2021    8:06 AM  PHQ 2/9 Scores  PHQ - 2 Score 0 2 0 2 1 1 1   PHQ- 9 Score  11 0 9 7 7 9     Fall Risk    08/04/2022    8:20 AM 05/10/2022   12:17 PM 10/08/2021    8:02 AM 09/10/2021    8:28 AM 07/22/2021    3:38 PM  Fall Risk   Falls in the past year? 0 0 1 1 0  Number falls in past yr: 0  1 1 0  Injury with Fall? 0  0 0 0  Risk for fall due to : No Fall Risks  Impaired balance/gait History of fall(s) Orthopedic patient  Follow up Falls prevention  discussed  Falls evaluation completed Education provided Falls prevention discussed    FALL RISK PREVENTION PERTAINING TO THE HOME:  Any stairs in or around the home? No  If so, are there any without handrails? No  Home free of loose throw rugs in walkways, pet beds, electrical cords, etc? Yes  Adequate lighting in your home to reduce risk of falls? Yes   ASSISTIVE DEVICES UTILIZED TO PREVENT FALLS:  Life alert? No  Use of a cane, walker or w/c? No  Grab bars in the bathroom? Yes  Shower chair or bench in shower? No  Elevated toilet seat or a handicapped toilet? No       11/22/2016    8:39 AM  MMSE - Mini Mental State Exam  Orientation to time 5  Orientation to Place 5  Registration 3  Attention/ Calculation 5  Recall 2  Language- name 2 objects 2  Language- repeat 1  Language- follow 3 step command 3  Language- read & follow direction 1  Write a sentence 1  Copy design 1  Total score 29        08/04/2022    8:23 AM 07/22/2021    3:43 PM 07/07/2020    2:34 PM  6CIT Screen  What Year? 0 points 0 points 0 points  What month? 0 points 0 points 0 points  What time? 0 points 0 points 0 points  Count back from 20 0 points 0 points 0 points  Months in reverse 0 points 0 points 0 points  Repeat phrase 0 points 2 points 0 points  Total Score 0 points 2 points 0 points    Immunizations Immunization History  Administered Date(s) Administered   Fluad Quad(high Dose 65+) 12/21/2018, 12/22/2020, 01/07/2022   Influenza Split 01/19/2014   Influenza,inj,Quad PF,6+ Mos 01/14/2015, 02/02/2016, 02/08/2018   Influenza-Unspecified 02/19/2014, 02/24/2020   Moderna Sars-Covid-2 Vaccination 05/12/2019, 05/23/2019, 03/10/2020   Pneumococcal Conjugate-13 08/09/2019   Pneumococcal Polysaccharide-23 04/23/2020   Tdap 12/11/2014   Zoster Recombinat (Shingrix) 04/23/2020, 10/08/2020    TDAP status: Up to date  Flu Vaccine status: Up to date  Pneumococcal vaccine status: Up to  date  Covid-19 vaccine status: Completed vaccines  Qualifies for Shingles Vaccine? Yes   Zostavax completed Yes   Shingrix Completed?: Yes  Screening Tests Health Maintenance  Topic Date Due   COVID-19 Vaccine (4 - 2023-24 season) 11/19/2021   INFLUENZA VACCINE  10/20/2022   Medicare Annual Wellness (AWV)  08/04/2023   DTaP/Tdap/Td (2 - Td or Tdap) 12/10/2024   COLONOSCOPY (Pts 45-29yrs Insurance coverage will need to be confirmed)  01/07/2029   Pneumonia Vaccine 67+ Years old  Completed   Hepatitis C Screening  Completed   Zoster Vaccines- Shingrix  Completed   HPV VACCINES  Aged Out    Health Maintenance  Health Maintenance Due  Topic Date Due   COVID-19 Vaccine (4 - 2023-24 season) 11/19/2021    Colorectal cancer screening: Type of screening: Colonoscopy. Completed 01/08/2019. Repeat every 10 years  Lung Cancer Screening: (Low Dose CT Chest recommended if Age 29-80 years, 30 pack-year currently smoking OR have quit w/in 15years.) does not qualify.   Lung Cancer Screening Referral: n/a  Additional Screening:  Hepatitis C Screening: does not qualify; Completed 11/22/2016  Vision Screening: Recommended annual ophthalmology exams for early detection of glaucoma and other disorders of the eye. Is the patient up to date with their annual eye exam?  Yes  Who is the provider or what is the name of the office in which the patient attends annual eye exams? Dr.Martin  If pt is not established with a provider, would they like to be referred to a provider to establish care? No .   Dental Screening: Recommended annual dental exams for proper oral hygiene  Community Resource Referral / Chronic Care Management: CRR required this visit?  No   CCM required this visit?  No      Plan:     I have personally reviewed and noted the following in the patient's chart:   Medical and social history Use of alcohol, tobacco or illicit drugs  Current medications and supplements  including opioid prescriptions. Patient is not currently taking opioid prescriptions. Functional ability and status Nutritional status Physical activity Advanced directives List of other physicians Hospitalizations, surgeries, and ER visits in previous 12 months Vitals Screenings to include cognitive, depression, and falls Referrals and appointments  In addition, I have reviewed and discussed with patient certain preventive protocols, quality metrics, and best practice recommendations. A written personalized care plan for preventive services as well as general preventive health recommendations were provided to patient.     Lorrene Reid, LPN   1/61/0960   Nurse Notes: none

## 2022-08-04 NOTE — Patient Instructions (Signed)
[name] , Thank you for taking time to come for your Medicare Wellness Visit. I appreciate your ongoing commitment to your health goals. Please review the following plan we discussed and let me know if I can assist you in the future.   These are the goals we discussed:  Goals      Increase physical activity     Try to walk every day and drink 3 bottles of water daily        This is a list of the screening recommended for you and due dates:  Health Maintenance  Topic Date Due   COVID-19 Vaccine (4 - 2023-24 season) 11/19/2021   Flu Shot  10/20/2022   Medicare Annual Wellness Visit  08/04/2023   DTaP/Tdap/Td vaccine (2 - Td or Tdap) 12/10/2024   Colon Cancer Screening  01/07/2029   Pneumonia Vaccine  Completed   Hepatitis C Screening: USPSTF Recommendation to screen - Ages 8-79 yo.  Completed   Zoster (Shingles) Vaccine  Completed   HPV Vaccine  Aged Out    Advanced directives: Advance directive discussed with you today. I have provided a copy for you to complete at home and have notarized. Once this is complete please bring a copy in to our office so we can scan it into your chart.   Conditions/risks identified: Aim for 30 minutes of exercise or brisk walking, 6-8 glasses of water, and 5 servings of fruits and vegetables each day.   Next appointment: Follow up in one year for your annual wellness visit.   Preventive Care 69 Years and Older, Male  Preventive care refers to lifestyle choices and visits with your health care provider that can promote health and wellness. What does preventive care include? A yearly physical exam. This is also called an annual well check. Dental exams once or twice a year. Routine eye exams. Ask your health care provider how often you should have your eyes checked. Personal lifestyle choices, including: Daily care of your teeth and gums. Regular physical activity. Eating a healthy diet. Avoiding tobacco and drug use. Limiting alcohol  use. Practicing safe sex. Taking low doses of aspirin every day. Taking vitamin and mineral supplements as recommended by your health care provider. What happens during an annual well check? The services and screenings done by your health care provider during your annual well check will depend on your age, overall health, lifestyle risk factors, and family history of disease. Counseling  Your health care provider may ask you questions about your: Alcohol use. Tobacco use. Drug use. Emotional well-being. Home and relationship well-being. Sexual activity. Eating habits. History of falls. Memory and ability to understand (cognition). Work and work Astronomer. Screening  You may have the following tests or measurements: Height, weight, and BMI. Blood pressure. Lipid and cholesterol levels. These may be checked every 5 years, or more frequently if you are over 51 years old. Skin check. Lung cancer screening. You may have this screening every year starting at age 6 if you have a 30-pack-year history of smoking and currently smoke or have quit within the past 15 years. Fecal occult blood test (FOBT) of the stool. You may have this test every year starting at age 59. Flexible sigmoidoscopy or colonoscopy. You may have a sigmoidoscopy every 5 years or a colonoscopy every 10 years starting at age 56. Prostate cancer screening. Recommendations will vary depending on your family history and other risks. Hepatitis C blood test. Hepatitis B blood test. Sexually transmitted disease (STD) testing.  Diabetes screening. This is done by checking your blood sugar (glucose) after you have not eaten for a while (fasting). You may have this done every 1-3 years. Abdominal aortic aneurysm (AAA) screening. You may need this if you are a current or former smoker. Osteoporosis. You may be screened starting at age 50 if you are at high risk. Talk with your health care provider about your test results,  treatment options, and if necessary, the need for more tests. Vaccines  Your health care provider may recommend certain vaccines, such as: Influenza vaccine. This is recommended every year. Tetanus, diphtheria, and acellular pertussis (Tdap, Td) vaccine. You may need a Td booster every 10 years. Zoster vaccine. You may need this after age 69. Pneumococcal 13-valent conjugate (PCV13) vaccine. One dose is recommended after age 69. Pneumococcal polysaccharide (PPSV23) vaccine. One dose is recommended after age 69. Talk to your health care provider about which screenings and vaccines you need and how often you need them. This information is not intended to replace advice given to you by your health care provider. Make sure you discuss any questions you have with your health care provider. Document Released: 04/03/2015 Document Revised: 11/25/2015 Document Reviewed: 01/06/2015 Elsevier Interactive Patient Education  2017 [place] Prevention in the Home Falls can cause injuries. They can happen to people of all ages. There are many things you can do to make your home safe and to help prevent falls. What can I do on the outside of my home? Regularly fix the edges of walkways and driveways and fix any cracks. Remove anything that might make you trip as you walk through a door, such as a raised step or threshold. Trim any bushes or trees on the path to your home. Use bright outdoor lighting. Clear any walking paths of anything that might make someone trip, such as rocks or tools. Regularly check to see if handrails are loose or broken. Make sure that both sides of any steps have handrails. Any raised decks and porches should have guardrails on the edges. Have any leaves, snow, or ice cleared regularly. Use sand or salt on walking paths during winter. Clean up any spills in your garage right away. This includes oil or grease spills. What can I do in the bathroom? Use night  lights. Install grab bars by the toilet and in the tub and shower. Do not use towel bars as grab bars. Use non-skid mats or decals in the tub or shower. If you need to sit down in the shower, use a plastic, non-slip stool. Keep the floor dry. Clean up any water that spills on the floor as soon as it happens. Remove soap buildup in the tub or shower regularly. Attach bath mats securely with double-sided non-slip rug tape. Do not have throw rugs and other things on the floor that can make you trip. What can I do in the bedroom? Use night lights. Make sure that you have a light by your bed that is easy to reach. Do not use any sheets or blankets that are too big for your bed. They should not hang down onto the floor. Have a firm chair that has side arms. You can use this for support while you get dressed. Do not have throw rugs and other things on the floor that can make you trip. What can I do in the kitchen? Clean up any spills right away. Avoid walking on wet floors. Keep items that you use a lot in easy-to-reach  places. If you need to reach something above you, use a strong step stool that has a grab bar. Keep electrical cords out of the way. Do not use floor polish or wax that makes floors slippery. If you must use wax, use non-skid floor wax. Do not have throw rugs and other things on the floor that can make you trip. What can I do with my stairs? Do not leave any items on the stairs. Make sure that there are handrails on both sides of the stairs and use them. Fix handrails that are broken or loose. Make sure that handrails are as long as the stairways. Check any carpeting to make sure that it is firmly attached to the stairs. Fix any carpet that is loose or worn. Avoid having throw rugs at the top or bottom of the stairs. If you do have throw rugs, attach them to the floor with carpet tape. Make sure that you have a light switch at the top of the stairs and the bottom of the stairs. If  you do not have them, ask someone to add them for you. What else can I do to help prevent falls? Wear shoes that: Do not have high heels. Have rubber bottoms. Are comfortable and fit you well. Are closed at the toe. Do not wear sandals. If you use a stepladder: Make sure that it is fully opened. Do not climb a closed stepladder. Make sure that both sides of the stepladder are locked into place. Ask someone to hold it for you, if possible. Clearly mark and make sure that you can see: Any grab bars or handrails. First and last steps. Where the edge of each step is. Use tools that help you move around (mobility aids) if they are needed. These include: Canes. Walkers. Scooters. Crutches. Turn on the lights when you go into a dark area. Replace any light bulbs as soon as they burn out. Set up your furniture so you have a clear path. Avoid moving your furniture around. If any of your floors are uneven, fix them. If there are any pets around you, be aware of where they are. Review your medicines with your doctor. Some medicines can make you feel dizzy. This can increase your chance of falling. Ask your doctor what other things that you can do to help prevent falls. This information is not intended to replace advice given to you by your health care provider. Make sure you discuss any questions you have with your health care provider. Document Released: 01/01/2009 Document Revised: 08/13/2015 Document Reviewed: 04/11/2014 Elsevier Interactive Patient Education  2017 [employer].

## 2022-08-31 ENCOUNTER — Encounter: Payer: Self-pay | Admitting: Family Medicine

## 2022-08-31 ENCOUNTER — Ambulatory Visit (INDEPENDENT_AMBULATORY_CARE_PROVIDER_SITE_OTHER): Payer: Medicare Other | Admitting: Family Medicine

## 2022-08-31 VITALS — BP 128/81 | HR 70 | Ht 73.0 in | Wt 257.0 lb

## 2022-08-31 DIAGNOSIS — Z79899 Other long term (current) drug therapy: Secondary | ICD-10-CM | POA: Diagnosis not present

## 2022-08-31 DIAGNOSIS — G894 Chronic pain syndrome: Secondary | ICD-10-CM | POA: Diagnosis not present

## 2022-08-31 DIAGNOSIS — Z978 Presence of other specified devices: Secondary | ICD-10-CM | POA: Diagnosis not present

## 2022-08-31 DIAGNOSIS — M5136 Other intervertebral disc degeneration, lumbar region: Secondary | ICD-10-CM | POA: Diagnosis not present

## 2022-08-31 DIAGNOSIS — K219 Gastro-esophageal reflux disease without esophagitis: Secondary | ICD-10-CM

## 2022-08-31 DIAGNOSIS — F419 Anxiety disorder, unspecified: Secondary | ICD-10-CM

## 2022-08-31 DIAGNOSIS — Z5181 Encounter for therapeutic drug level monitoring: Secondary | ICD-10-CM | POA: Diagnosis not present

## 2022-08-31 DIAGNOSIS — F32A Depression, unspecified: Secondary | ICD-10-CM

## 2022-08-31 DIAGNOSIS — I10 Essential (primary) hypertension: Secondary | ICD-10-CM

## 2022-08-31 DIAGNOSIS — G4733 Obstructive sleep apnea (adult) (pediatric): Secondary | ICD-10-CM

## 2022-08-31 DIAGNOSIS — M961 Postlaminectomy syndrome, not elsewhere classified: Secondary | ICD-10-CM | POA: Diagnosis not present

## 2022-08-31 MED ORDER — HYDROCHLOROTHIAZIDE 25 MG PO TABS: ORAL_TABLET | ORAL | 0 refills | Status: DC

## 2022-08-31 MED ORDER — FUROSEMIDE 40 MG PO TABS: 40.0000 mg | ORAL_TABLET | Freq: Every morning | ORAL | 0 refills | Status: DC

## 2022-08-31 MED ORDER — HYDROCHLOROTHIAZIDE 25 MG PO TABS: 25.0000 mg | ORAL_TABLET | Freq: Every day | ORAL | 3 refills | Status: DC

## 2022-08-31 MED ORDER — FUROSEMIDE 40 MG PO TABS: 40.0000 mg | ORAL_TABLET | Freq: Every morning | ORAL | 3 refills | Status: DC

## 2022-08-31 MED ORDER — PANTOPRAZOLE SODIUM 40 MG PO TBEC: 40.0000 mg | DELAYED_RELEASE_TABLET | Freq: Every day | ORAL | 0 refills | Status: DC

## 2022-08-31 MED ORDER — PANTOPRAZOLE SODIUM 40 MG PO TBEC: 40.0000 mg | DELAYED_RELEASE_TABLET | Freq: Every day | ORAL | 3 refills | Status: DC

## 2022-08-31 MED ORDER — SERTRALINE HCL 100 MG PO TABS: 150.0000 mg | ORAL_TABLET | Freq: Every day | ORAL | 3 refills | Status: DC

## 2022-08-31 MED ORDER — SERTRALINE HCL 100 MG PO TABS: 150.0000 mg | ORAL_TABLET | Freq: Every day | ORAL | 0 refills | Status: DC

## 2022-08-31 NOTE — Progress Notes (Signed)
BP 128/81   Pulse 70   Ht 6\' 1"  (1.854 m)   Wt 257 lb (116.6 kg)   SpO2 96%   BMI 33.91 kg/m    Subjective:   Patient ID: Jeff Stewart, male    DOB: Jun 21, 1955, 67 y.o.   MRN: [id number]  HPI: Jeff Stewart is a 67 y.o. male presenting on 08/31/2022 for Medical Management of Chronic Issues, Hypertension, and Gastroesophageal Reflux   HPI Hypertension Patient is currently on losartan and hydrochlorothiazide and furosemide, and their blood pressure today is 128/81. Patient denies any lightheadedness or dizziness. Patient denies headaches, blurred vision, chest pains, shortness of breath, or weakness. Denies any side effects from medication and is content with current medication.   GERD Patient is currently on pantoprazole.  She denies any major symptoms or abdominal pain or belching or burping. She denies any blood in her stool or lightheadedness or dizziness.   Sleep apnea Patient has known sleep apnea and is not under treatment for it and would like to get a referral because been 5 years since he has been seen anywhere.  Anxiety depression recheck Patient is coming in for anxiety and depression recheck and is currently using Zoloft.  His biggest issue is he feels a lot of fatigue but he does admit that he had the sleep apnea and has not been treating it for least 5 years any muscular type of again.    08/31/2022    1:16 PM 08/04/2022    8:22 AM 05/10/2022   12:17 PM 10/08/2021    8:03 AM 09/10/2021    8:28 AM  Depression screen PHQ 2/9  Decreased Interest 1 0 1 0 1  Down, Depressed, Hopeless 2 0 1 0 1  PHQ - 2 Score 3 0 2 0 2  Altered sleeping 3  3 0 2  Tired, decreased energy 3  3 0 2  Change in appetite 0  2 0 1  Feeling bad or failure about yourself  0  0 0 0  Trouble concentrating 0  1 0 1  Moving slowly or fidgety/restless 0  0 0 1  Suicidal thoughts 0  0 0 0  PHQ-9 Score 9  11 0 9  Difficult doing work/chores Somewhat difficult  Somewhat difficult Not difficult  at all Somewhat difficult     Relevant past medical, surgical, family and social history reviewed and updated as indicated. Interim medical history since our last visit reviewed. Allergies and medications reviewed and updated.  Review of Systems  Constitutional:  Negative for chills and fever.  Eyes:  Negative for visual disturbance.  Respiratory:  Negative for shortness of breath and wheezing.   Cardiovascular:  Negative for chest pain and leg swelling.  Musculoskeletal:  Negative for back pain and gait problem.  Skin:  Negative for rash.  Neurological:  Negative for dizziness, weakness and light-headedness.  All other systems reviewed and are negative.   Per HPI unless specifically indicated above   Allergies as of 08/31/2022       Reactions   Nalbuphine Nausea And Vomiting, Rash, Shortness Of Breath   Nubain [nalbuphine Hcl] Rash        Medication List        Accurate as of August 31, 2022  1:42 PM. If you have any questions, ask your nurse or doctor.          STOP taking these medications    amoxicillin-clavulanate 875-125 MG tablet Commonly known as: AUGMENTIN Stopped by:  Elige Radon Carly Applegate, MD       TAKE these medications    acetaminophen 500 MG tablet Commonly known as: TYLENOL Take 500 mg by mouth every 6 (six) hours as needed.   benzonatate 100 MG capsule Commonly known as: Tessalon Perles Take 1 capsule (100 mg total) by mouth 3 (three) times daily as needed for cough.   cetirizine 10 MG tablet Commonly known as: ZYRTEC Take 1 tablet (10 mg total) by mouth daily.   fluticasone 50 MCG/ACT nasal spray Commonly known as: FLONASE Place 2 sprays into both nostrils daily.   furosemide 40 MG tablet Commonly known as: LASIX Take 1 tablet (40 mg total) by mouth every morning.   hydrochlorothiazide 25 MG tablet Commonly known as: HYDRODIURIL Take 1 tablet (25 mg total) by mouth daily. TAKE 1 TABLET DAILY. What changed:  how much to take how to  take this when to take this Changed by: Nils Pyle, MD   losartan 50 MG tablet Commonly known as: COZAAR Take 1 tablet (50 mg total) by mouth 2 (two) times daily.   pantoprazole 40 MG tablet Commonly known as: PROTONIX Take 1 tablet (40 mg total) by mouth daily.   sertraline 100 MG tablet Commonly known as: ZOLOFT Take 1.5 tablets (150 mg total) by mouth daily.   SYRINGE 3CC/21GX1-1/4" 21G X 1-1/4" 3 ML Misc 1 each by Does not apply route once a week.   testosterone cypionate 200 MG/ML injection Commonly known as: DEPOTESTOSTERONE CYPIONATE INJECT 0.5 MLS INTRAMUSCULARY EVERY 7 DAYS   Turmeric 500 MG Caps Take by mouth.   Vitamin D3 125 MCG (5000 UT) Caps Take 5,000 Units by mouth daily with breakfast.         Objective:   BP 128/81   Pulse 70   Ht 6\' 1"  (1.854 m)   Wt 257 lb (116.6 kg)   SpO2 96%   BMI 33.91 kg/m   Wt Readings from Last 3 Encounters:  08/31/22 257 lb (116.6 kg)  08/04/22 258 lb (117 kg)  05/19/22 261 lb 12.8 oz (118.8 kg)    Physical Exam Vitals and nursing note reviewed.  Constitutional:      General: He is not in acute distress.    Appearance: He is well-developed. He is not diaphoretic.  Eyes:     General: No scleral icterus.    Conjunctiva/sclera: Conjunctivae normal.  Neck:     Thyroid: No thyromegaly.  Cardiovascular:     Rate and Rhythm: Normal rate and regular rhythm.     Heart sounds: Normal heart sounds. No murmur heard. Pulmonary:     Effort: Pulmonary effort is normal. No respiratory distress.     Breath sounds: Normal breath sounds. No wheezing.  Musculoskeletal:        General: No swelling. Normal range of motion.     Cervical back: Neck supple.  Lymphadenopathy:     Cervical: No cervical adenopathy.  Skin:    General: Skin is warm and dry.     Findings: No rash.  Neurological:     Mental Status: He is alert and oriented to person, place, and time.     Coordination: Coordination normal.  Psychiatric:         Behavior: Behavior normal.       Assessment & Plan:   Problem List Items Addressed This Visit       Cardiovascular and Mediastinum   Essential hypertension, benign - Primary   Relevant Medications   hydrochlorothiazide (HYDRODIURIL) 25 MG  tablet   furosemide (LASIX) 40 MG tablet   Other Relevant Orders   CBC with Differential/Platelet   CMP14+EGFR   Lipid panel   TSH     Respiratory   OSA (obstructive sleep apnea)   Relevant Orders   Ambulatory referral to Neurology     Digestive   GERD (gastroesophageal reflux disease)   Relevant Medications   pantoprazole (PROTONIX) 40 MG tablet   Other Relevant Orders   CBC with Differential/Platelet   CMP14+EGFR   Lipid panel   TSH     Other   Anxiety and depression   Relevant Medications   sertraline (ZOLOFT) 100 MG tablet   Other Relevant Orders   CBC with Differential/Platelet   CMP14+EGFR   Lipid panel   TSH    Continue current medicine, did a new referral to the sleep apnea doctor. Follow up plan: Return in about 6 months (around 03/02/2023), or if symptoms worsen or fail to improve, for Hypertension and anxiety and GERD.  Counseling provided for all of the vaccine components Orders Placed This Encounter  Procedures   CBC with Differential/Platelet   CMP14+EGFR   Lipid panel   TSH   Ambulatory referral to Neurology    Arville Care, MD [facility] 08/31/2022, 1:42 PM

## 2022-09-01 LAB — CBC WITH DIFFERENTIAL/PLATELET
Basophils Absolute: 0.1 10*3/uL (ref 0.0–0.2)
Basos: 1 %
EOS (ABSOLUTE): 0.1 10*3/uL (ref 0.0–0.4)
Eos: 2 %
Hematocrit: 42.6 % (ref 37.5–51.0)
Hemoglobin: 14.1 g/dL (ref 13.0–17.7)
Immature Grans (Abs): 0 10*3/uL (ref 0.0–0.1)
Immature Granulocytes: 0 %
Lymphocytes Absolute: 1.1 10*3/uL (ref 0.7–3.1)
Lymphs: 22 %
MCH: 28.7 pg (ref 26.6–33.0)
MCHC: 33.1 g/dL (ref 31.5–35.7)
MCV: 87 fL (ref 79–97)
Monocytes Absolute: 0.6 10*3/uL (ref 0.1–0.9)
Monocytes: 11 %
Neutrophils Absolute: 3.1 10*3/uL (ref 1.4–7.0)
Neutrophils: 64 %
Platelets: 174 10*3/uL (ref 150–450)
RBC: 4.92 x10E6/uL (ref 4.14–5.80)
RDW: 13 % (ref 11.6–15.4)
WBC: 4.8 10*3/uL (ref 3.4–10.8)

## 2022-09-01 LAB — CMP14+EGFR
ALT: 16 IU/L (ref 0–44)
AST: 17 IU/L (ref 0–40)
Albumin/Globulin Ratio: 1.9
Albumin: 4.6 g/dL (ref 3.9–4.9)
Alkaline Phosphatase: 94 IU/L (ref 44–121)
BUN/Creatinine Ratio: 17 (ref 10–24)
BUN: 17 mg/dL (ref 8–27)
Bilirubin Total: 0.4 mg/dL (ref 0.0–1.2)
CO2: 29 mmol/L (ref 20–29)
Calcium: 9.8 mg/dL (ref 8.6–10.2)
Chloride: 97 mmol/L (ref 96–106)
Creatinine, Ser: 1.02 mg/dL (ref 0.76–1.27)
Globulin, Total: 2.4 g/dL (ref 1.5–4.5)
Glucose: 81 mg/dL (ref 70–99)
Potassium: 4.3 mmol/L (ref 3.5–5.2)
Sodium: 139 mmol/L (ref 134–144)
Total Protein: 7 g/dL (ref 6.0–8.5)
eGFR: 80 mL/min/{1.73_m2} (ref >59)

## 2022-09-01 LAB — LIPID PANEL
Chol/HDL Ratio: 3.8 ratio (ref 0.0–5.0)
Cholesterol, Total: 175 mg/dL (ref 100–199)
HDL: 46 mg/dL (ref >39)
LDL Chol Calc (NIH): 108 mg/dL — ABNORMAL HIGH (ref 0–99)
Triglycerides: 115 mg/dL (ref 0–149)
VLDL Cholesterol Cal: 21 mg/dL (ref 5–40)

## 2022-09-01 LAB — TSH: TSH: 3.2 u[IU]/mL (ref 0.450–4.500)

## 2022-09-06 DIAGNOSIS — D225 Melanocytic nevi of trunk: Secondary | ICD-10-CM | POA: Diagnosis not present

## 2022-09-06 DIAGNOSIS — L309 Dermatitis, unspecified: Secondary | ICD-10-CM | POA: Diagnosis not present

## 2022-09-06 DIAGNOSIS — L718 Other rosacea: Secondary | ICD-10-CM | POA: Diagnosis not present

## 2022-09-06 DIAGNOSIS — L814 Other melanin hyperpigmentation: Secondary | ICD-10-CM | POA: Diagnosis not present

## 2022-09-06 DIAGNOSIS — L821 Other seborrheic keratosis: Secondary | ICD-10-CM | POA: Diagnosis not present

## 2022-09-06 DIAGNOSIS — L570 Actinic keratosis: Secondary | ICD-10-CM | POA: Diagnosis not present

## 2022-09-07 DIAGNOSIS — M5416 Radiculopathy, lumbar region: Secondary | ICD-10-CM | POA: Diagnosis not present

## 2022-09-08 ENCOUNTER — Other Ambulatory Visit: Payer: Self-pay | Admitting: Orthopaedic Surgery

## 2022-09-08 DIAGNOSIS — M5416 Radiculopathy, lumbar region: Secondary | ICD-10-CM

## 2022-09-14 ENCOUNTER — Other Ambulatory Visit: Payer: Medicare Other

## 2022-09-18 LAB — CBC WITH DIFFERENTIAL/PLATELET
Basophils Absolute: 0 10*3/uL (ref 0.0–0.2)
Basos: 1 %
EOS (ABSOLUTE): 0.1 10*3/uL (ref 0.0–0.4)
Eos: 3 %
Hematocrit: 41.7 % (ref 37.5–51.0)
Hemoglobin: 13.4 g/dL (ref 13.0–17.7)
Immature Grans (Abs): 0 10*3/uL (ref 0.0–0.1)
Immature Granulocytes: 0 %
Lymphocytes Absolute: 1.5 10*3/uL (ref 0.7–3.1)
Lymphs: 32 %
MCH: 28 pg (ref 26.6–33.0)
MCHC: 32.1 g/dL (ref 31.5–35.7)
MCV: 87 fL (ref 79–97)
Monocytes Absolute: 0.5 10*3/uL (ref 0.1–0.9)
Monocytes: 11 %
Neutrophils Absolute: 2.4 10*3/uL (ref 1.4–7.0)
Neutrophils: 53 %
Platelets: 178 10*3/uL (ref 150–450)
RBC: 4.78 x10E6/uL (ref 4.14–5.80)
RDW: 12.8 % (ref 11.6–15.4)
WBC: 4.5 10*3/uL (ref 3.4–10.8)

## 2022-09-18 LAB — TESTOSTERONE, FREE, TOTAL, SHBG
Sex Hormone Binding: 20.2 nmol/L (ref 19.3–76.4)
Testosterone, Free: 12.7 pg/mL (ref 6.6–18.1)
Testosterone: 624 ng/dL (ref 264–916)

## 2022-09-20 ENCOUNTER — Ambulatory Visit: Payer: Medicare Other | Admitting: "Endocrinology

## 2022-09-20 ENCOUNTER — Encounter: Payer: Self-pay | Admitting: "Endocrinology

## 2022-09-20 VITALS — BP 116/78 | HR 64 | Ht 73.0 in | Wt 261.8 lb

## 2022-09-20 DIAGNOSIS — E559 Vitamin D deficiency, unspecified: Secondary | ICD-10-CM | POA: Diagnosis not present

## 2022-09-20 DIAGNOSIS — E291 Testicular hypofunction: Secondary | ICD-10-CM

## 2022-09-20 NOTE — Progress Notes (Signed)
09/20/2022                     Endocrinology follow-up note   Subjective:    Patient ID: Jeff Stewart, male    DOB: 1955-08-01, PCP Stewart, Jeff Radon, MD   Past Medical History:  Diagnosis Date   Anxiety    Carpal tunnel syndrome, bilateral    Cataract    removed years ago   Chronic back pain    internal morphine pump   DDD (degenerative disc disease)    neck, lumbar   Depression    Dyslipidemia    diet controlled   GERD (gastroesophageal reflux disease)    past hx- had nissen fundiplication    Hypertension    borderline   Sleep apnea    wears C-PAP   Status post insertion of spinal cord stimulator    Past Surgical History:  Procedure Laterality Date   BACK SURGERY     x 6   CARPAL TUNNEL RELEASE     bilateral   COLONOSCOPY     ELBOW SURGERY     INGUINAL HERNIA REPAIR Right    INTERNAL MORPHINE PUMP      KNEE ARTHROSCOPY     NASAL SEPTUM SURGERY     x 6   neck fusion      NISSEN FUNDOPLICATION     SKIN CANCER EXCISION     SPINAL CORD STIMULATOR INSERTION     STOMACH SURGERY     Nissen Fundiplication   thumb surgery     TOTAL KNEE ARTHROPLASTY Left 04/24/2018   Procedure: TOTAL KNEE ARTHROPLASTY;  Surgeon: Jeff Apley, MD;  Location: [facility];  Service: Orthopedics;  Laterality: Left;   UPPER GASTROINTESTINAL ENDOSCOPY     Social History   Socioeconomic History   Marital status: Married    Spouse name: Not on file   Number of children: 3   Years of education: Not on file   Highest education level: Not on file  Occupational History   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  Tobacco Use   Smoking status: Never   Smokeless tobacco: Never  Vaping Use   Vaping Use: Never used  Substance and Sexual Activity   Alcohol use: No   Drug use: No   Sexual activity: Not Currently    Birth control/protection: Post-menopausal    Comment: married for 1972  Other Topics Concern   Not on file  Social History Narrative   Lives with wife     Children live nearby   Social Determinants of Health   Stewart Resource Strain: Low Risk  (08/04/2022)   Overall Stewart Resource Strain (CARDIA)    Difficulty of Paying Living Expenses: Not hard at all  Food Insecurity: No Food Insecurity (08/04/2022)   Hunger Vital Sign    Worried About Running Out of Food in the Last Year: Never true    Ran Out of Food in the Last Year: Never true  Transportation Needs: No Transportation Needs (08/04/2022)   PRAPARE - Administrator, Civil Service (Medical): No    Lack of Transportation (Non-Medical): No  Physical Activity: Insufficiently Active (08/04/2022)   Exercise Vital Sign    Days of Exercise per Week: 3 days    Minutes of Exercise per Session: 30 min  Stress: No Stress Concern Present (08/04/2022)   Jeff Stewart of Occupational Health - Occupational Stress Questionnaire    Feeling of Stress : Not at all  Social Connections: Moderately Integrated (08/04/2022)   Social Connection and Isolation Panel [NHANES]    Frequency of Communication with Friends and Family: More than three times a week    Frequency of Social Gatherings with Friends and Family: More than three times a week    Attends Religious Services: More than 4 times per year    Active Member of Jeff Stewart or Organizations: No    Attends Banker Meetings: Never    Marital Status: Married   Outpatient Encounter Medications as of 09/20/2022  Medication Sig   Cholecalciferol (VITAMIN D) 50 MCG (2000 UT) CAPS Take 2,000 Units by mouth daily with lunch.   acetaminophen (TYLENOL) 500 MG tablet Take 500 mg by mouth every 6 (six) hours as needed.   benzonatate (TESSALON PERLES) 100 MG capsule Take 1 capsule (100 mg total) by mouth 3 (three) times daily as needed for cough.   cetirizine (ZYRTEC) 10 MG tablet Take 1 tablet (10 mg total) by mouth daily.   fluticasone (FLONASE) 50 MCG/ACT nasal spray Place 2 sprays into both nostrils daily.   furosemide (LASIX) 40 MG  tablet Take 1 tablet (40 mg total) by mouth every morning.   hydrochlorothiazide (HYDRODIURIL) 25 MG tablet Take 1 tablet (25 mg total) by mouth daily. TAKE 1 TABLET DAILY.   losartan (COZAAR) 50 MG tablet Take 1 tablet (50 mg total) by mouth 2 (two) times daily.   pantoprazole (PROTONIX) 40 MG tablet Take 1 tablet (40 mg total) by mouth daily.   sertraline (ZOLOFT) 100 MG tablet Take 1.5 tablets (150 mg total) by mouth daily.   Syringe/Needle, Disp, (SYRINGE 3CC/21GX1-1/4") 21G X 1-1/4" 3 ML MISC 1 each by Does not apply route once a week.   testosterone cypionate (DEPOTESTOSTERONE CYPIONATE) 200 MG/ML injection INJECT 0.5 MLS INTRAMUSCULARY EVERY 7 DAYS   Turmeric 500 MG CAPS Take by mouth.   [DISCONTINUED] Cholecalciferol (VITAMIN D3) 125 MCG (5000 UT) CAPS Take 5,000 Units by mouth daily with breakfast.   No facility-administered encounter medications on file as of 09/20/2022.   ALLERGIES: Allergies  Allergen Reactions   Nalbuphine Nausea And Vomiting, Rash and Shortness Of Breath   Nubain [Nalbuphine Hcl] Rash   VACCINATION STATUS: Immunization History  Administered Date(s) Administered   Fluad Quad(high Dose 65+) 12/21/2018, 12/22/2020, 01/07/2022   Influenza Split 01/19/2014   Influenza,inj,Quad PF,6+ Mos 01/14/2015, 02/02/2016, 02/08/2018   Influenza-Unspecified 02/19/2014, 02/24/2020   Moderna Sars-Covid-2 Vaccination 05/12/2019, 05/23/2019, 03/10/2020   Pneumococcal Conjugate-13 08/09/2019   Pneumococcal Polysaccharide-23 04/23/2020   Tdap 12/11/2014   Zoster Recombinant(Shingrix) 04/23/2020, 10/08/2020    HPI [name] is a 67 year old gentleman with a medical history as above.  He is being seen in follow-up for hypogonadism on testosterone replacement therapy.   -He remains on testosterone cypionate .  He is currently on testosterone 100 mg IM every 7 days.  His previsit labs show total testosterone 624.    No other major interval medical history.  -He reports better  energy level, better mobility. He was known to have low testosterone for at least since age 64.   No evidence of adverse effects from testosterone treatment.  He wishes to be continued on testosterone placement therapy. He fathers 3 grown children. He denies history of head injury , has had nasal septal surgery multiple times. He denies injury to the testicles, exposure to chemotherapy, exposure to radiation to the genitals. However, due to chronic back injury and surgery he has exposure to heavy opioid therapy for pain control.  He is currently on morphine pump. He has been off and  on  opioids for pain control for the last 10-15 years.  He struggles with sleep apnea, even changing CPAP machines did not help much.  Review of Systems Limited as above.  Objective:    BP 116/78   Pulse 64   Ht 6\' 1"  (1.854 m)   Wt 261 lb 12.8 oz (118.8 kg)   BMI 34.54 kg/m   Wt Readings from Last 3 Encounters:  09/20/22 261 lb 12.8 oz (118.8 kg)  08/31/22 257 lb (116.6 kg)  08/04/22 258 lb (117 kg)      From previous exam.  Genitourinary system: He has bilaterally shrunk testicles, right testes 12 mL, left testes 15 mL. No scrotal mass, no varicocele,  no inguinal lymphadenopathy.    Recent Results (from the past 2160 hour(s))  CBC with Differential/Platelet     Status: None   Collection Time: 08/31/22  1:49 PM  Result Value Ref Range   WBC 4.8 3.4 - 10.8 x10E3/uL   RBC 4.92 4.14 - 5.80 x10E6/uL   Hemoglobin 14.1 13.0 - 17.7 g/dL   Hematocrit 09.8 11.9 - 51.0 %   MCV 87 79 - 97 fL   MCH 28.7 26.6 - 33.0 pg   MCHC 33.1 31.5 - 35.7 g/dL   RDW 14.7 82.9 - 56.2 %   Platelets 174 150 - 450 x10E3/uL   Neutrophils 64 Not Estab. %   Lymphs 22 Not Estab. %   Monocytes 11 Not Estab. %   Eos 2 Not Estab. %   Basos 1 Not Estab. %   Neutrophils Absolute 3.1 1.4 - 7.0 x10E3/uL   Lymphocytes Absolute 1.1 0.7 - 3.1 x10E3/uL   Monocytes Absolute 0.6 0.1 - 0.9 x10E3/uL   EOS (ABSOLUTE) 0.1 0.0 - 0.4  x10E3/uL   Basophils Absolute 0.1 0.0 - 0.2 x10E3/uL   Immature Granulocytes 0 Not Estab. %   Immature Grans (Abs) 0.0 0.0 - 0.1 x10E3/uL  CMP14+EGFR     Status: None   Collection Time: 08/31/22  1:49 PM  Result Value Ref Range   Glucose 81 70 - 99 mg/dL   BUN 17 8 - 27 mg/dL   Creatinine, Ser 1.30 0.76 - 1.27 mg/dL   eGFR 80 >86 VH/QIO/9.62   BUN/Creatinine Ratio 17 10 - 24   Sodium 139 134 - 144 mmol/L   Potassium 4.3 3.5 - 5.2 mmol/L   Chloride 97 96 - 106 mmol/L   CO2 29 20 - 29 mmol/L   Calcium 9.8 8.6 - 10.2 mg/dL   Total Protein 7.0 6.0 - 8.5 g/dL   Albumin 4.6 3.9 - 4.9 g/dL   Globulin, Total 2.4 1.5 - 4.5 g/dL   Albumin/Globulin Ratio 1.9    Bilirubin Total 0.4 0.0 - 1.2 mg/dL   Alkaline Phosphatase 94 44 - 121 IU/L   AST 17 0 - 40 IU/L   ALT 16 0 - 44 IU/L  Lipid panel     Status: Abnormal   Collection Time: 08/31/22  1:49 PM  Result Value Ref Range   Cholesterol, Total 175 100 - 199 mg/dL   Triglycerides 952 0 - 149 mg/dL   HDL 46 >84 mg/dL   VLDL Cholesterol Cal 21 5 - 40 mg/dL   LDL Chol Calc (NIH) 132 (H) 0 - 99 mg/dL   Chol/HDL Ratio 3.8 0.0 - 5.0 ratio    Comment:  T. Chol/HDL Ratio                                             Men  Women                               1/2 Avg.Risk  3.4    3.3                                   Avg.Risk  5.0    4.4                                2X Avg.Risk  9.6    7.1                                3X Avg.Risk 23.4   11.0   TSH     Status: None   Collection Time: 08/31/22  1:49 PM  Result Value Ref Range   TSH 3.200 0.450 - 4.500 uIU/mL  Testosterone, Free, Total, SHBG     Status: None   Collection Time: 09/14/22  8:14 AM  Result Value Ref Range   Testosterone 624 264 - 916 ng/dL    Comment: Adult male reference interval is based on a population of healthy nonobese males (BMI <30) between 25 and 69 years old. Travison, et.al. JCEM [phone]. PMID: 82956213.    Testosterone,  Free 12.7 6.6 - 18.1 pg/mL   Sex Hormone Binding 20.2 19.3 - 76.4 nmol/L  CBC with Differential/Platelet     Status: None   Collection Time: 09/14/22  8:14 AM  Result Value Ref Range   WBC 4.5 3.4 - 10.8 x10E3/uL   RBC 4.78 4.14 - 5.80 x10E6/uL   Hemoglobin 13.4 13.0 - 17.7 g/dL   Hematocrit 08.6 57.8 - 51.0 %   MCV 87 79 - 97 fL   MCH 28.0 26.6 - 33.0 pg   MCHC 32.1 31.5 - 35.7 g/dL   RDW 46.9 62.9 - 52.8 %   Platelets 178 150 - 450 x10E3/uL   Neutrophils 53 Not Estab. %   Lymphs 32 Not Estab. %   Monocytes 11 Not Estab. %   Eos 3 Not Estab. %   Basos 1 Not Estab. %   Neutrophils Absolute 2.4 1.4 - 7.0 x10E3/uL   Lymphocytes Absolute 1.5 0.7 - 3.1 x10E3/uL   Monocytes Absolute 0.5 0.1 - 0.9 x10E3/uL   EOS (ABSOLUTE) 0.1 0.0 - 0.4 x10E3/uL   Basophils Absolute 0.0 0.0 - 0.2 x10E3/uL   Immature Granulocytes 0 Not Estab. %   Immature Grans (Abs) 0.0 0.0 - 0.1 x10E3/uL     Assessment & Plan:   1. Hypogonadism  2.  Obesity/sleep apnea  3.  Vitamin D deficiency.  -Testosterone replacement helped improve his testosterone from 74 ->286 -> 782 -> 509 > 978 > 659-> 437 >494> 327-> 506-> 640 -> 673 -> 529 -> 446 -> 340 -> 488 ->325 -> 700 -> 135 -> 228 -> 624   No adverse events noted. 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   -  It is possible that it is multifactorial including exposure to heavy opioids, multiple back surgeries, prior inguinal surgery, or idiopathic.  -Prolactin level is favorable at 6.1, he will not need pituitary/sella imaging for now.  -He has benefited from testosterone replacement therapy and he wishes to be continued on treatment.     -He is advised to continue  his testosterone  100 mg IM every 7 days to give him a total of 400 mg monthly.   Target testosterone level 300-600 mcg/dL.   -He we will return in 10-months with total and free testosterone measurements.    Regarding his  sleep apnea: He would benefit the most from weight loss.  - he acknowledges that there is a room for improvement in his food and drink choices. - Suggestion is made for him to avoid simple carbohydrates  from his diet including Cakes, Sweet Desserts, Ice Cream, Soda (diet and regular), Sweet Tea, Candies, Chips, Cookies, Store Bought Juices, Alcohol in Excess of  1-2 drinks a day, Artificial Sweeteners,  Coffee Creamer, and "Sugar-free" Products, Lemonade. This will help patient  avoid unintended weight gain.  He would benefit from continued vitamin D supplement with maintenance dose of 2000 units daily.    - I advised patient to maintain close follow up with Stewart, Jeff Radon, MD for primary care needs.   I spent  21  minutes in the care of the patient today including review of labs from Thyroid Function, CMP, and other relevant labs ; imaging/biopsy records (current and previous including abstractions from other facilities); face-to-face time discussing  his lab results and symptoms, medications doses, his options of short and long term treatment based on the latest standards of care / guidelines;   and documenting the encounter.  Vilma Prader Chaviano  participated in the discussions, expressed understanding, and voiced agreement with the above plans.  All questions were answered to his satisfaction. he is encouraged to contact clinic should he have any questions or concerns prior to his return visit.   Follow up plan: Return in about 4 months (around 01/21/2023) for Fasting Labs  in AM B4 8.  Marquis Lunch, MD Phone: [phone]  Fax: [phone]   This note was partially dictated with voice recognition software. Similar sounding words can be transcribed inadequately or may not  be corrected upon review.  09/20/2022, 10:11 AM

## 2022-09-25 ENCOUNTER — Ambulatory Visit
Admission: RE | Admit: 2022-09-25 | Discharge: 2022-09-25 | Disposition: A | Discharge status: Home / Self Care | Payer: Medicare Other | Source: Ambulatory Visit | Attending: Orthopaedic Surgery | Admitting: Orthopaedic Surgery

## 2022-09-25 DIAGNOSIS — M5416 Radiculopathy, lumbar region: Secondary | ICD-10-CM

## 2022-10-06 ENCOUNTER — Encounter: Payer: Self-pay | Admitting: Neurology

## 2022-10-06 ENCOUNTER — Ambulatory Visit: Payer: Medicare Other | Admitting: Neurology

## 2022-10-06 VITALS — BP 121/71 | HR 70 | Ht 73.0 in | Wt 257.0 lb

## 2022-10-06 DIAGNOSIS — Z9289 Personal history of other medical treatment: Secondary | ICD-10-CM | POA: Diagnosis not present

## 2022-10-06 DIAGNOSIS — Z789 Other specified health status: Secondary | ICD-10-CM | POA: Diagnosis not present

## 2022-10-06 DIAGNOSIS — Z9889 Other specified postprocedural states: Secondary | ICD-10-CM | POA: Diagnosis not present

## 2022-10-06 NOTE — Progress Notes (Addendum)
SLEEP MEDICINE CLINIC    Provider:  Melvyn Novas, MD  Primary Care Physician:  Dettinger, Elige Radon, MD [address] [place] [address]     Referring Provider: Dettinger, Elige Radon, Md [address] [place],  [place] [address]          Chief Complaint according to patient   Patient presents with:     New Patient (Initial Visit)           HISTORY OF PRESENT ILLNESS:  Jeff Stewart is a 67 y.o. male patient who is seen upon referral on 10/06/2022 from Dr.Dettinger  for a Sleep consultation. This patient has been  dx with OSA many years ago but was never able to use an tolerate PAP therapy, had several nasal surgeries.  He has not told us before , but he has undergone a HST ( not mentioned in his referral ) and he has seen ENT for inspire - and was deemed "not a good candidate" after an upper airway endoscopy. He has in the past undergone a UPPP in [place] , Dr  Lovett Calender.   HST result was not available for Dr. Jenne Pane either. Patient doesn't know who did this test. Who referred for this test?  None is mentioned in EPIC?    Chief concern according to patient :  I need to find a way to treat my apnea.    The patient had the first sleep study in the year 2000 no results in EPIC none documented but he has pulmonary care, orthopedist is Dr Sharolyn Douglas, did his 5th and 6 th back surgery ,  had many neck surgeries. 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 Had Nissen fundoplication , this patient also has a morphine pump implanted, he has a spinal cord stimulator implanted which has been inactive for the last 2 or 3 years, he is s/p UPPP and 6 nasal plasties, he had a cervical anterior fusion..     Family medical Verne Spurr history: Daughter on CPAP with OSA,  Social history:  Patient is retired from [employer],  and lives in a household with spouse,  , 3 adult children  The patient currently works part time.  Tobacco use; none .  ETOH use ; not now  ,  Caffeine intake in form of Coffee( 1 cup of coffee) Soda( 3-4 / d) Tea (  rare ) , no energy drinks      Sleep habits are as follows: The patient's dinner time is between 5.30 PM. He often sleeps in a recliner before bedtime- 1-2 hours . The patient goes to bed at 10.30 PM and continues to sleep for intervals of 2 hours, wakes for 3-4 bathroom breaks.   The preferred sleep position is elevated , with the support of 2 pillows. Dreams are reportedly frequent/vivid. Often nightmares an acting out dreams.   The patient wakes up spontaneously, 5  AM is the usual rise time. He reports not feeling refreshed or restored in AM, with symptoms such as dry mouth, morning headaches, neck pain. and residual fatigue.  Naps are taken frequently, lasting from 20 to 30 minutes in AM and after lunch.   Review of Systems: Out of a complete 14 system review, the patient complains of only the following symptoms, and all other reviewed systems are negative.:   He falls asleep driving and his wife now does all driving.    Chronic pain, chronic neck,  back and hip pain,  Vertigo on meds.  Fatigue, sleepiness , snoring, fragmented sleep, Insomnia, REM BD Nocturia yes   How likely are you to doze in the following situations: 0 = not likely, 1 = slight chance, 2 = moderate chance, 3 = high chance   Sitting and Reading? Watching Television? Sitting inactive in a public place (theater or meeting)? As a passenger in a car for an hour without a break? Lying down in the afternoon when circumstances permit? Sitting and talking to someone? Sitting quietly after lunch without alcohol? In a car, while stopped for a few minutes in traffic?   Total = 23/ 24 points   FSS endorsed at 60/ 63 points.   Social History   Socioeconomic History   Marital status: Married    Spouse name: Not on file   Number of children: 3   Years of education: Not on file   Highest education level: Not on file  Occupational History    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  Tobacco Use   Smoking status: Never   Smokeless tobacco: Never  Vaping Use   Vaping status: Never Used  Substance and Sexual Activity   Alcohol use: No   Drug use: No   Sexual activity: Not Currently    Birth control/protection: Post-menopausal    Comment: married for 1972  Other Topics Concern   Not on file  Social History Narrative   Lives with wife    Children live nearby   Social Determinants of Health   Financial Resource Strain: Low Risk  (08/04/2022)   Overall Financial Resource Strain (CARDIA)    Difficulty of Paying Living Expenses: Not hard at all  Food Insecurity: No Food Insecurity (08/04/2022)   Hunger Vital Sign    Worried About Running Out of Food in the Last Year: Never true    Ran Out of Food in the Last Year: Never true  Transportation Needs: No Transportation Needs (08/04/2022)   PRAPARE - Administrator, Civil Service (Medical): No    Lack of Transportation (Non-Medical): No  Physical Activity: Insufficiently Active (08/04/2022)   Exercise Vital Sign    Days of Exercise per Week: 3 days    Minutes of Exercise per Session: 30 min  Stress: No Stress Concern Present (08/04/2022)   [employer] of Occupational Health - Occupational Stress Questionnaire    Feeling of Stress : Not at all  Social Connections: Moderately Integrated (08/04/2022)   Social Connection and Isolation Panel [NHANES]    Frequency of Communication with Friends and Family: More than three times a week    Frequency of Social Gatherings with Friends and Family: More than three times a week    Attends Religious Services: More than 4 times per year    Active Member of [employer] or Organizations: No    Attends Banker Meetings: Never    Marital Status: Married    Family History  Problem Relation Age of Onset   CAD Father 48   Emphysema Father    Stroke Mother 38   CAD Brother 52       CABG   Alcohol abuse Brother     Hypertension Brother    Stroke Brother    CAD Brother    Cirrhosis Brother    Alcohol abuse Brother    Hypertension Brother    CAD Sister    Hypertension Sister    Hyperlipidemia Sister    Cancer Sister  melanoma   Cancer Sister    Lung cancer Sister    Cancer Brother    Lung cancer Brother    Cancer Brother    Lung cancer Brother    Colon cancer Neg Hx    Colon polyps Neg Hx    Esophageal cancer Neg Hx    Rectal cancer Neg Hx    Stomach cancer Neg Hx     Past Medical History:  Diagnosis Date   Anxiety    Carpal tunnel syndrome, bilateral    Cataract    removed years ago   Chronic back pain    internal morphine pump   DDD (degenerative disc disease)    neck, lumbar   Depression    Dyslipidemia    diet controlled   GERD (gastroesophageal reflux disease)    past hx- had nissen fundiplication    Hypertension    borderline   Sleep apnea    wears C-PAP   Status post insertion of spinal cord stimulator     Past Surgical History:  Procedure Laterality Date   BACK SURGERY     x 6   CARPAL TUNNEL RELEASE     bilateral   COLONOSCOPY     ELBOW SURGERY     INGUINAL HERNIA REPAIR Right    INTERNAL MORPHINE PUMP      KNEE ARTHROSCOPY     NASAL SEPTUM SURGERY     x 6   neck fusion      NISSEN FUNDOPLICATION     SKIN CANCER EXCISION     SPINAL CORD STIMULATOR INSERTION     STOMACH SURGERY     Nissen Fundiplication   thumb surgery     TOTAL KNEE ARTHROPLASTY Left 04/24/2018   Procedure: TOTAL KNEE ARTHROPLASTY;  Surgeon: Sheral Apley, MD;  Location: [facility];  Service: Orthopedics;  Laterality: Left;   UPPER GASTROINTESTINAL ENDOSCOPY       Current Outpatient Medications on File Prior to Visit  Medication Sig Dispense Refill   acetaminophen (TYLENOL) 500 MG tablet Take 500 mg by mouth every 6 (six) hours as needed.     cetirizine (ZYRTEC) 10 MG tablet Take 1 tablet (10 mg total) by mouth daily. 90 tablet 3   Cholecalciferol (VITAMIN D) 50 MCG  (2000 UT) CAPS Take 2,000 Units by mouth daily with lunch.     fluticasone (FLONASE) 50 MCG/ACT nasal spray Place 2 sprays into both nostrils daily. 16 g 11   furosemide (LASIX) 40 MG tablet Take 1 tablet (40 mg total) by mouth every morning. 90 tablet 3   hydrochlorothiazide (HYDRODIURIL) 25 MG tablet Take 1 tablet (25 mg total) by mouth daily. TAKE 1 TABLET DAILY. 90 tablet 3   losartan (COZAAR) 50 MG tablet Take 1 tablet (50 mg total) by mouth 2 (two) times daily. 180 tablet 3   pantoprazole (PROTONIX) 40 MG tablet Take 1 tablet (40 mg total) by mouth daily. 90 tablet 3   sertraline (ZOLOFT) 100 MG tablet Take 1.5 tablets (150 mg total) by mouth daily. 135 tablet 3   Syringe/Needle, Disp, (SYRINGE 3CC/21GX1-1/4") 21G X 1-1/4" 3 ML MISC 1 each by Does not apply route once a week. 100 each 0   testosterone cypionate (DEPOTESTOSTERONE CYPIONATE) 200 MG/ML injection INJECT 0.5 MLS INTRAMUSCULARY EVERY 7 DAYS 10 mL 1   Turmeric 500 MG CAPS Take by mouth.     benzonatate (TESSALON PERLES) 100 MG capsule Take 1 capsule (100 mg total) by mouth 3 (three) times daily as  needed for cough. (Patient not taking: Reported on 10/06/2022) 20 capsule 0   No current facility-administered medications on file prior to visit.    Allergies  Allergen Reactions   Nalbuphine Nausea And Vomiting, Rash and Shortness Of Breath   Nubain [Nalbuphine Hcl] Rash     DIAGNOSTIC DATA (LABS, IMAGING, TESTING) - I reviewed patient records, labs, notes, testing and imaging myself where available.  Lab Results  Component Value Date   WBC 4.5 09/14/2022   HGB 13.4 09/14/2022   HCT 41.7 09/14/2022   MCV 87 09/14/2022   PLT 178 09/14/2022      Component Value Date/Time   NA 139 08/31/2022 1349   K 4.3 08/31/2022 1349   CL 97 08/31/2022 1349   CO2 29 08/31/2022 1349   GLUCOSE 81 08/31/2022 1349   GLUCOSE 82 04/17/2018 0933   BUN 17 08/31/2022 1349   CREATININE 1.02 08/31/2022 1349   CALCIUM 9.8 08/31/2022 1349    PROT 7.0 08/31/2022 1349   ALBUMIN 4.6 08/31/2022 1349   AST 17 08/31/2022 1349   ALT 16 08/31/2022 1349   ALKPHOS 94 08/31/2022 1349   BILITOT 0.4 08/31/2022 1349   GFRNONAA 82 10/30/2019 0836   GFRAA 95 10/30/2019 0836   Lab Results  Component Value Date   CHOL 175 08/31/2022   HDL 46 08/31/2022   LDLCALC 108 (H) 08/31/2022   TRIG 115 08/31/2022   CHOLHDL 3.8 08/31/2022   No results found for: "HGBA1C" Lab Results  Component Value Date   VITAMINB12 275 09/10/2021   Lab Results  Component Value Date   TSH 3.200 08/31/2022    PHYSICAL EXAM:  Today's Vitals   10/06/22 0951  BP: 121/71  Pulse: 70  Weight: 257 lb (116.6 kg)  Height: 6\' 1"  (1.854 m)   Body mass index is 33.91 kg/m.   Wt Readings from Last 3 Encounters:  10/06/22 257 lb (116.6 kg)  09/20/22 261 lb 12.8 oz (118.8 kg)  08/31/22 257 lb (116.6 kg)     Ht Readings from Last 3 Encounters:  10/06/22 6\' 1"  (1.854 m)  09/20/22 6\' 1"  (1.854 m)  08/31/22 6\' 1"  (1.854 m)      General: The patient is awake, alert and appears not in acute distress. The patient is well groomed. Head: Normocephalic, atraumatic.  Neck is supple. Mallampati  UPPP ,  neck circumference:19.5 inches . Nasal airflow not fully patent.  left is occluded . Retrognathia is not seen.  Dental status:  dentures  Cardiovascular:  Regular rate and cardiac rhythm by pulse,  without distended neck veins. Respiratory: Lungs are clear to auscultation.  Skin:  Without evidence of ankle edema, or rash. Trunk: The patient's posture is erect.   NEUROLOGIC EXAM: The patient is awake and alert, oriented to place and time.   Memory subjective described as intact.  Attention span & concentration ability appears limited - he can't remember dates and places of medical procedures.  Speech is fluent,  without  dysarthria, dysphonia or aphasia.  Mood and affect are appropriate.   Cranial nerves: no loss of smell or taste reported  Pupils are equal  and briskly reactive to light. Funduscopic exam deferred..  Extraocular movements in vertical and horizontal planes were intact and without nystagmus. No Diplopia. Visual fields by finger perimetry are intact. Hearing was intact to soft voice and finger rubbing.    Facial sensation intact to fine touch.  Facial motor strength is symmetric and tongue and uvula move midline.  Neck  ROM : rotation, tilt and flexion extension were normal for age and shoulder shrug was symmetrical.    Motor exam:  Symmetric bulk, tone and ROM.   Normal tone without cog wheeling, symmetric grip strength .      ASSESSMENT AND PLAN 67 y.o. year old male  here for SLEEP APNEA evlauation , status post nasal septoplasty,  UPPP, cervical fusion, back surgery dr Noel Gerold, chronic pain in therapy at [facility] . All specialists through [facility] , Primary Care through [facility] Dr Dettinger.   I have only the patients verbal report as to having had a BIPAP machine and tried several masks in the year 2014 ?   1) I have no access to previous sleep studies - I am confused as to hearing abut a recent HST and having no results to look at, and I would not have taken this patient on if he has had a recent sleep study elsewhere.   2) I will order an HST - I am not going to spent more time on detective work. He is not an inspire candidate by his own recollection after seeing ENT Dr Jenne Pane.    3) I also can't offer in lab studies before OCTOBER 2024.      I plan to follow up either personally or through our NP within 5 months.      After spending a total time of  35  minutes face to face and additional time for physical and neurologic examination, review of laboratory studies,  personal review of imaging studies, reports and results of other testing and review of referral information / records as far as provided in visit,   Electronically signed by: Melvyn Novas, MD 10/06/2022 10:04 AM  [facility] and  [facility] Board certified by The [employer] of Sleep Medicine and Diplomate of the [employer] of Sleep Medicine. Board certified In Neurology through the ABPN, Fellow of the [employer] of Neurology.

## 2022-10-25 ENCOUNTER — Ambulatory Visit (INDEPENDENT_AMBULATORY_CARE_PROVIDER_SITE_OTHER): Payer: Medicare Other | Admitting: Neurology

## 2022-10-25 DIAGNOSIS — Z789 Other specified health status: Secondary | ICD-10-CM

## 2022-10-25 DIAGNOSIS — G4733 Obstructive sleep apnea (adult) (pediatric): Secondary | ICD-10-CM

## 2022-10-25 DIAGNOSIS — Z9889 Other specified postprocedural states: Secondary | ICD-10-CM

## 2022-10-25 DIAGNOSIS — G4731 Primary central sleep apnea: Secondary | ICD-10-CM

## 2022-10-25 DIAGNOSIS — Z9289 Personal history of other medical treatment: Secondary | ICD-10-CM

## 2022-10-26 ENCOUNTER — Telehealth: Payer: Self-pay | Admitting: Neurology

## 2022-10-26 DIAGNOSIS — G4719 Other hypersomnia: Secondary | ICD-10-CM

## 2022-10-26 DIAGNOSIS — G4733 Obstructive sleep apnea (adult) (pediatric): Secondary | ICD-10-CM

## 2022-10-26 DIAGNOSIS — G4731 Primary central sleep apnea: Secondary | ICD-10-CM

## 2022-10-26 DIAGNOSIS — F1190 Opioid use, unspecified, uncomplicated: Secondary | ICD-10-CM

## 2022-10-26 DIAGNOSIS — Z789 Other specified health status: Secondary | ICD-10-CM

## 2022-10-26 NOTE — Procedures (Signed)
[facility]  HOME SLEEP TEST (Watch PAT) REPORT  STUDY DATE: 10/25/2018  DOB: Jun 12, 1955  MRN: [id number]  ORDERING CLINICIAN: Dr. Vickey Huger, Porfirio Mylar - study interpreted on behalf of Dr. Vickey Huger   REFERRING CLINICIAN: Dettinger, Elige Radon, MD   CLINICAL INFORMATION/HISTORY: 67 year old male with underlying medical history of hypertension, chronic back pain, status post spinal cord stimulator placement, on chronic narcotic pain medication, hyperlipidemia, reflux disease, status post multiple nasal surgeries, and obesity, who was previously diagnosed with obstructive sleep apnea and tried positive airway pressure (PAP) therapy.  He is currently not on APAP machine.  He presents for reevaluation.  Epworth sleepiness score: 23/24.  BMI: 34.2 kg/m  FINDINGS:   Sleep Summary:   Total Recording Time (hours, min): 8 hours, 8 min  Total Sleep Time (hours, min):  7 hours, 15 min  Percent REM (%):    19.2%   Respiratory Indices:   Calculated pAHI (per hour):  68.4/hour      (AHI 52.8/h, utilizing the 4% desaturation criteria for hypopneas per Medicare guidelines)   REM pAHI:    78/hour       NREM pAHI: 66.3/hour  Central pAHI: 33.8/hour  Oxygen Saturation Statistics:    Oxygen Saturation (%) Mean: 94%   Minimum oxygen saturation (%):                 82%   O2 Saturation Range (%): 82 - 100%    O2 Saturation (minutes) <=88%: 1.4 min  Pulse Rate Statistics:   Pulse Mean (bpm):    58/min    Pulse Range (41 - 110/min)   IMPRESSION:   Primary Central Sleep Apnea  OSA (obstructive sleep apnea), severe  RECOMMENDATION:  This home sleep test demonstrates severe obstructive sleep apnea with a total AHI of 68.4/hour and O2 nadir of 82%.  Snoring ranged from mild to louder.  There was a significant central apnea component, in the severe range with a central apnea index of 33.8/h.  Utilizing the 4% desaturation criteria for hypopneas per Medicare guidelines,  his central apnea index was more than 50% of the respiratory events.  As such, he has primary central sleep apnea and treatment with a positive airway pressure device may be his best treatment option, however AutoPap and standard CPAP therapy may not be appropriate to address his central sleep apnea.  For that reason, I recommend a designated CPAP/BiPAP titration study in-lab. Alternative treatment options are limited secondary to the severity of the patient's sleep disordered breathing, but also secondary to the central respiratory events.  An implantable hypoglossal nerve stimulator is not approved for or effective in central sleep apnea. Concomitant weight loss is recommended, where clinically appropriate.  Please note, that untreated obstructive sleep apnea may carry additional perioperative morbidity. Patients with significant obstructive sleep apnea should receive perioperative PAP therapy and the surgeons and particularly the anesthesiologist should be informed of the diagnosis and the severity of the sleep disordered breathing. The patient should be cautioned not to drive, work at heights, or operate dangerous or heavy equipment when tired or sleepy. Review and reiteration of good sleep hygiene measures should be pursued with any patient. Other causes of the patient's symptoms, including circadian rhythm disturbances, an underlying mood disorder, medication effect and/or an underlying medical problem cannot be ruled out based on this test. Clinical correlation is recommended.  The patient and his referring provider will be notified of the test results. The patient will be seen in follow up in sleep clinic  at [facility].  I certify that I have reviewed the raw data recording prior to the issuance of this report in accordance with the standards of the American Academy of Sleep Medicine (AASM).  INTERPRETING PHYSICIAN:   Huston Foley, MD, PhD (on behalf of Dr. Melvyn Novas) Medical Director, Alric Quan  Sleep at [facility]) Diplomat, ABPN (Neurology and Sleep)   [facility] [address], [address] [place], [place] [address] [phone]

## 2022-10-26 NOTE — Telephone Encounter (Signed)
This patient saw Dr. Vickey Huger for sleep evaluation.  He had a home sleep test on 10/25/2022.  I read the study on Dr. Oliva Bustard behalf:  Please inform patient that his home sleep test showed central sleep apnea as well as obstructive sleep apnea.  Standard AutoPap therapy or CPAP therapy will not typically suffice as treatment, I would therefore recommend that he come in for designated titration study in the sleep lab, I have placed an order for titration and we will seek insurance approval for this.  We will call him once we can proceed with in lab testing to optimize treatment settings for him.  Alternative treatment options are very limited due to the severity of his sleep apnea and the fact that he has central sleep apnea. Of note, the risk for central sleep apnea is increased in patients who take narcotic pain medications.   I would recommend that he continue to work on weight loss and that he work closely with his pain management specialist on limiting his narcotic pain medications as much as possible.

## 2022-10-26 NOTE — Progress Notes (Signed)
See procedure note.

## 2022-10-27 NOTE — Telephone Encounter (Signed)
Called the patient and reviewed the sleep study results. I advised pt that Dr. Frances Furbish reviewed their sleep study results (in Dr Dohmeier's absence and found that pt had a combination of obstructive and central sleep apnea and recommends that pt be treated with a cpap. Dr. Frances Furbish recommends that pt return for a repeat sleep study in order to properly titrate the cpap and ensure a good mask fit. Pt is agreeable to returning for a titration study. I advised pt that our sleep lab will file with pt's insurance and call pt to schedule the sleep study when we hear back from the pt's insurance regarding coverage of this sleep study. Pt verbalized understanding of results. Pt had no questions at this time but was encouraged to call back if questions arise.

## 2022-11-02 DIAGNOSIS — Z978 Presence of other specified devices: Secondary | ICD-10-CM | POA: Diagnosis not present

## 2022-11-02 DIAGNOSIS — R079 Chest pain, unspecified: Secondary | ICD-10-CM | POA: Diagnosis not present

## 2022-11-02 DIAGNOSIS — M961 Postlaminectomy syndrome, not elsewhere classified: Secondary | ICD-10-CM | POA: Diagnosis not present

## 2022-11-02 DIAGNOSIS — G894 Chronic pain syndrome: Secondary | ICD-10-CM | POA: Diagnosis not present

## 2022-11-02 DIAGNOSIS — M542 Cervicalgia: Secondary | ICD-10-CM | POA: Diagnosis not present

## 2022-11-02 DIAGNOSIS — M5136 Other intervertebral disc degeneration, lumbar region: Secondary | ICD-10-CM | POA: Diagnosis not present

## 2022-11-07 ENCOUNTER — Telehealth: Payer: Self-pay | Admitting: Neurology

## 2022-11-07 NOTE — Telephone Encounter (Signed)
CPAP- [employer] medicare no auth req.  Sent mychart and left voicemail.

## 2022-11-08 NOTE — Telephone Encounter (Signed)
Patient returned my call.  CPAP- [employer] medicare no auth req   Patient is scheduled at [facility] for 12/18/22 at 9 pm.  Mailed packet to the patient.

## 2022-12-02 DIAGNOSIS — L570 Actinic keratosis: Secondary | ICD-10-CM | POA: Diagnosis not present

## 2022-12-02 DIAGNOSIS — L820 Inflamed seborrheic keratosis: Secondary | ICD-10-CM | POA: Diagnosis not present

## 2022-12-02 DIAGNOSIS — L578 Other skin changes due to chronic exposure to nonionizing radiation: Secondary | ICD-10-CM | POA: Diagnosis not present

## 2022-12-02 DIAGNOSIS — L718 Other rosacea: Secondary | ICD-10-CM | POA: Diagnosis not present

## 2022-12-02 DIAGNOSIS — L538 Other specified erythematous conditions: Secondary | ICD-10-CM | POA: Diagnosis not present

## 2022-12-02 DIAGNOSIS — D1801 Hemangioma of skin and subcutaneous tissue: Secondary | ICD-10-CM | POA: Diagnosis not present

## 2022-12-18 ENCOUNTER — Ambulatory Visit (INDEPENDENT_AMBULATORY_CARE_PROVIDER_SITE_OTHER): Payer: Medicare Other | Admitting: Neurology

## 2022-12-18 DIAGNOSIS — F1190 Opioid use, unspecified, uncomplicated: Secondary | ICD-10-CM

## 2022-12-18 DIAGNOSIS — G4731 Primary central sleep apnea: Secondary | ICD-10-CM | POA: Diagnosis not present

## 2022-12-18 DIAGNOSIS — Z789 Other specified health status: Secondary | ICD-10-CM

## 2022-12-18 DIAGNOSIS — G4719 Other hypersomnia: Secondary | ICD-10-CM

## 2022-12-18 DIAGNOSIS — G4733 Obstructive sleep apnea (adult) (pediatric): Secondary | ICD-10-CM

## 2022-12-26 NOTE — Procedures (Signed)
[facility] PAP TITRATION INTERPRETATION REPORT   STUDY DATE: 12/18/2022      PATIENT NAME:  Jeff Stewart         DATE OF BIRTH:  02/01/1956  PATIENT ID:  [id number]    TYPE OF STUDY:  BiPAP  READING PHYSICIAN: Melvyn Novas, MD Dr Dettinger is PCP and referred.  Dr Frances Furbish read the preceding baseline study and ordered the return to ful night PAP titration. SCORING TECHNICIAN: Domingo Cocking, RPSGT   HISTORY: Jeff Stewart is a 67 year-old patient who had years ago been diagnosed with OSA, was unable to tolerate CPAP and had undergone several ENT surgeries. 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  HST was recently done somewhere ( not included in his referral ) and he had seen ENT Dr Jenne Pane but failed the endoscopy for [facility] -   He reports excessive daytime sleepiness and is willing to try other modalities than CPAP if this should still not be tolerated well.  The Epworth Sleepiness Scale was 23 /24 (scores above or equal to 10 are suggestive of hypersomnolence).  DESCRIPTION: A sleep technologist was in attendance for the duration of the recording.  Data collection, scoring, video monitoring, and reporting were performed in compliance with the AASM Manual for the Scoring of Sleep and Associated Events; (Hypopnea is scored based on the criteria listed in Section VIII D. 1b in the AASM Manual V2.6 using a 4% oxygen desaturation rule or Hypopnea is scored based on the criteria listed in Section VIII D. 1a in the AASM Manual V2.6 using 3% oxygen desaturation and /or arousal rule).  A physician certified by the American Board of Sleep Medicine reviewed each epoch of the study.  ADDITIONAL INFORMATION:  Height: 73.0 in Weight: 257 lb (BMI 33) Neck Size: 19.5 in    MEDICATIONS: Tylenol, Zyrtec, Vitamin D, Flonase, Lasix, Hydrodiuril, Cozaar, Protonix, Zoloft, Depotestosterone Cypionate,  Turmeric, Tessalon perles.   SLEEP CONTINUITY AND SLEEP ARCHITECTURE:  Lights off was at 21:38: and lights on 05:23: (7.7 hours in bed). Total sleep time was 439.5 minutes with a normal sleep efficiency at 94.8%.  Sleep latency was decreased at 4.5 minutes.  The study started on a FFM ( the patient was offered 2 options per tech, Evora or Simplus -both FFM and chose the Wilson model in S/W.  CPAP started at 5 cm water pressure and was explored up to 14cm water, at no time reaching an AHI lower than 35/h. Mr. Brynda Greathouse changed to BiPAP at 14/ 10 cm water and titrated from there to 18/14 cm water, with a still high residual AHI of 8.21/h by CMS criteria . All recorded sleep was in supine.    Of the total sleep time, the percentage of stage N1 sleep was 4.8%, stage N2 sleep was 68.9%, stage N3 sleep was 7.3%, and REM sleep was 19.0%. There were 4 Stage R periods observed on this study night, 13 awakenings (i.e. transitions to Stage W from any sleep stage), and 91.0 total stage transitions.  Wake after sleep onset (WASO) time accounted for 19 minutes.  AROUSALS: There were 129 arousals in total, for an arousal index of 15.8 arousals/hour.  Of these, 83 were identified as respiratory-related arousals (11.3 /h), 0 were PLM-related arousals (0.0 /h), and 36 were non-specific arousals (4.9 /h)  RESPIRATORY MONITORING:  Based on CMS criteria (using a 4% oxygen desaturation rule for scoring hypopneas),  there were 214 apneas (109 obstructive; 84 central; 21 mixed), and 72 hypopneas.   The CMS based scoring showed an Apnea index of 29.2 /h. Hypopnea index was 9.8/h. The apnea-hypopnea index was 39.0/h overall  with 39.0 in supine, 0.0 non-supine; 16.5 REM, 16.5 supine REM. There were 0 respiratory effort-related arousals (RERAs).   Based on AASM criteria (using a 3% oxygen desaturation and /or arousal rule for scoring hypopneas), there were 214 apneas (109 obstructive; 84 central; 21 mixed), and 136 hypopneas. Apnea  index was 29.2. Hypopnea index was 18.6. The apnea-hypopnea index was 47.8 overall (47.8 supine, 0.0 non-supine; 21.6 REM, 21.6 supine REM). There were 0 respiratory effort-related arousals (RERAs).   BODY POSITION: Duration of total sleep and percent of total sleep in their respective position is as follows: supine 439 minutes (100.0%), non-supine 0.0 minutes (0.0%); right 00 minutes (0.0%), left 00 minutes (0.0%), and prone 00 minutes (0.0%). Total supine REM sleep time was 83 minutes (100.0% of total REM sleep). EKG : The electrocardiogram documented normal sinus rhythm.  The average heart rate during sleep was 52 bpm.  The maximum heart rate during sleep was 79 bpm and minimum heart rate was 43 bpm    OXIMETRY: Total sleep time spent at, or below 88% was 1.1 minutes, or 0.3% of total sleep time.Respiratory events were associated with oxyhemoglobin desaturations (nadir during sleep was sat 02 of  87%,  from a mean of 97%. LIMB MOVEMENTS: There were 0 periodic limb movements of sleep (0.0/h), of which 0 (0.0/h) were associated with an arousal.  IMPRESSION:  The patient slept actually well through this turbulent night of increasing pressures on CPAP and BiPAP and did best at the highest explored BiPAP pressure of 18/ 14 cm water.  I would like to order the [facility] for him in s/w as used that night of the test. He may benefit from the maximum setting of BiPAP available: 20 / 16 cm water.  There was no hypoxia present.  The Patient reached 34% REM sleep under the last explored BiPAP pressure with 98% sleep efficiency.    RECOMMENDATIONS: I would like to order the [facility] for him in s/w as used that night of the test. He may benefit from the maximum setting of BiPAP available: 20 / 16 cm water with easy breathe function and heated humidity.     Melvyn Novas, MD            [facility] CPAP/BiPAP/ BiPAP ST  Report     General Information  Name:  Irvine, Glorioso BMI: 33 Physician: ,   ID: [id number] Height: 73 in Technician: Domingo Cocking  Sex: Male Weight: 257 lb Record: [id number]  Age: 89 [Aug 18, 1955] Date: 12/18/2022 Scorer: Domingo Cocking   [facility] CPAP Report     General Information  Name: Zakaria, Sedor BMI: 33 Physician: ,   ID: [id number] Height: 73 in Technician: Domingo Cocking  Sex: Male Weight: 257 lb Record: [id number]  Age: 42 [1955-07-28] Date: 12/18/2022 Scorer: Domingo Cocking     Pressure Settings Phase BASELINE THERAPY THERAPY THERAPY THERAPY THERAPY THERAPY THERAPY   Protocol - - - - - - - -   Max Pressure - - - - - - - -   IPAP 05 05 07 09 11 12 14 14    EPAP 05 05 07 09 11 12 14 10    PS - - - - - - - -  Device - - - - - - - -   Mask - - - - - - - -   Backup Rate - - - - - - - -   Humidity - - - - - - - -  Time TRT 29.16m 3.30m 30.43m 31.30m 17.35m 31.46m 14.68m 28.9m   TST 25.65m 3.54m 30.41m 31.26m 17.53m 31.50m 14.71m 28.51m   Event Epoch 10 69 75 136 198 232 294 323  Sleep Stage % Wake 0.0 0.0 0.0 0.0 0.0 0.0 0.0 0.0   % REM 0.0 0.0 0.0 51.6 97.1 0.0 0.0 0.0   % N1 10.0 0.0 0.0 0.0 2.9 9.7 0.0 0.0   % N2 90.0 100.0 100.0 48.4 0.0 62.9 93.1 100.0   % N3 0.0 0.0 0.0 0.0 0.0 27.4 6.9 0.0  Respiratory Total Events 41 4 44 30 10 38 16 33   Obs. Apn. 18 4 31 12 4 7 5 18    Mixed Apn. 3 0 1 4 1 3 3 5    Cen. Apn. 20 0 7 7 1 27 7 9    Obs. Hyp. 0 0 5 7 4 1 1 1    Cen. Hyp. 0 0 0 0 0 0 0 0   AHI 98.40 80.00 86.56 58.06 35.29 73.55 66.21 69.47   Supine AHI 98.40 80.00 86.56 58.06 35.29 73.55 66.21 69.47   Prone AHI 0.00 0.00 0.00 0.00 0.00 0.00 0.00 0.00   Side AHI 0.00 0.00 0.00 0.00 0.00 0.00 0.00 0.00  Respiratory (4%) Obs. Hyp. (4%) 0.00 0.00 5.00 7.00 3.00 1.00 0.00 0.00   Cen. Hyp. (4%) 0.00 0.00 0.00 0.00 0.00 0.00 0.00 0.00   AHI (4%) 98.40 80.00 86.56 58.06 31.76 73.55 62.07 67.37   Supine AHI (4%) 98.40 80.00 86.56 58.06 31.76 73.55 62.07 67.37    Prone AHI (4%) 0.00 0.00 0.00 0.00 0.00 0.00 0.00 0.00   Side AHI (4%) 0.00 0.00 0.00 0.00 0.00 0.00 0.00 0.00  Desat Profile <= 90% 2.55m 0.38m 0.57m 0.75m 0.64m 0.106m 0.50m 0.68m   <= 80% 1.42m 0.17m 0.30m 0.75m 0.64m 0.39m 0.73m 0.65m   <= 70% 1.59m 0.46m 0.34m 0.47m 0.55m 0.33m 0.44m 0.1m   <= 60% 1.57m 0.2m 0.29m 0.51m 0.34m 0.28m 0.5m 0.29m  Arousal Index Apnea 16.8 60.0 11.8 9.7 10.6 13.5 33.1 18.9   Hypopnea 0.0 0.0 0.0 1.9 0.0 1.9 0.0 2.1   LM 0.0 0.0 0.0 0.0 0.0 0.0 0.0 2.1   Spontaneous 0.0 20.0 2.0 9.7 3.5 0.0 12.4 6.3   Pressure Settings Phase THERAPY THERAPY THERAPY THERAPY THERAPY   Protocol - - - - -   Max Pressure - - - - -   IPAP 14 15 16 17 18    EPAP 10 11 12 13 14    PS - - - - -   Device - - - - -   Mask - - - - -   Backup Rate 10 10 10 10 10    Humidity - - - - -  Time TRT 33.21m 28.84m 48.9m 73.27m 96.98m   TST 33.79m 23.70m 40.62m 68.48m 94.45m   Event Epoch 380 446 502 598 744  Sleep Stage % Wake 0.0 17.9 16.7 6.2 1.6   % REM 0.0 0.0 0.0 27.0 34.4   % N1 0.0 28.3 7.5 7.3 0.5   % N2 100.0 71.7 90.0 62.0 45.0   % N3 0.0 0.0 2.5 3.6 20.1  Respiratory Total Events 32 23 28 23 28    Obs. Apn. 6 1  1 0 2   Mixed Apn. 0 1 0 0 0   Cen. Apn. 6 0 0 0 0   Obs. Hyp. 20 21 27 23 26    Cen. Hyp. 0 0 0 0 0   AHI 58.18 60.00 42.00 20.15 17.78   Supine AHI 58.18 60.00 42.00 20.15 17.78   Prone AHI 0.00 0.00 0.00 0.00 0.00   Side AHI 0.00 0.00 0.00 0.00 0.00  Respiratory (4%) Obs. Hyp. (4%) 10.00 13.00 13.00 9.00 11.00   Cen. Hyp. (4%) 0.00 0.00 0.00 0.00 0.00   AHI (4%) 40.00 39.13 21.00 7.88 8.25   Supine AHI (4%) 40.00 39.13 21.00 7.88 8.25   Prone AHI (4%) 0.00 0.00 0.00 0.00 0.00   Side AHI (4%) 0.00 0.00 0.00 0.00 0.00  Desat Profile <= 90% 0.76m 0.43m 5.57m 0.29m 0.34m   <= 80% 0.28m 0.20m 4.23m 0.27m 0.36m   <= 70% 0.61m 0.64m 4.110m 0.59m 0.40m   <= 60% 0.22m 0.29m 4.73m 0.53m 0.58m  Arousal Index Apnea 12.7 5.2 0.0 0.0 0.0   Hypopnea 5.5 33.9 4.5 2.6 0.6   LM 0.0 0.0 1.5 2.6 0.6   Spontaneous 5.5 15.7 3.0  6.1 2.5    Recommended Settings IPAP: N/A cmH20 EPAP: N/A cmH2O AHI: N/A AHI (4%): N/A   Pressure IPAP/EPAP 00 05 07 09 14 / 10 14 / 10 11 15  / 11   O2 Vol 0.0 0.0 0.0 0.0 0.0 0.0 0.0 0.0  Time TRT 0.91m 32.10m 30.17m 31.50m 33.1m 28.40m 17.70m 28.44m   TST 0.32m 28.89m 30.73m 31.88m 33.62m 28.12m 17.60m 23.64m  Sleep Stage % Wake 0.0 0.0 0.0 0.0 0.0 0.0 0.0 17.9   % REM 0.0 0.0 0.0 51.6 0.0 0.0 97.1 0.0   % N1 0.0 8.9 0.0 0.0 0.0 0.0 2.9 28.3   % N2 0.0 91.1 100.0 48.4 100.0 100.0 0.0 71.7   % N3 0.0 0.0 0.0 0.0 0.0 0.0 0.0 0.0  Respiratory Total Events 0 45 44 30 32 33 10 23   Obs. Apn. 0 22 31 12 6 18 4 1    Mixed Apn. 0 3 1 4  0 5 1 1    Cen. Apn. 0 20 7 7 6 9 1  0   Hypopneas 0 0 5 7 20 1 4 21    AHI 0.00 96.43 86.56 58.06 58.18 69.47 35.29 60.00   Supine AHI 0.00 96.43 86.56 58.06 58.18 69.47 35.29 60.00   Prone AHI 0.00 0.00 0.00 0.00 0.00 0.00 0.00 0.00   Side AHI 0.00 0.00 0.00 0.00 0.00 0.00 0.00 0.00  Respiratory (4%) Hypopneas (4%) 0.00 0.00 5.00 7.00 10.00 0.00 3.00 13.00   AHI (4%) 0.00 96.43 86.56 58.06 40.00 67.37 31.76 39.13   Supine AHI (4%) 0.00 96.43 86.56 58.06 40.00 67.37 31.76 39.13   Prone AHI (4%) 0.00 0.00 0.00 0.00 0.00 0.00 0.00 0.00   Side AHI (4%) 0.00 0.00 0.00 0.00 0.00 0.00 0.00 0.00  Desat Profile <= 90% 0.71m 2.68m 0.35m 0.31m 0.58m 0.42m 0.41m 0.45m   <= 80% 0.15m 1.64m 0.19m 0.77m 0.23m 0.61m 0.88m 0.65m   <= 70% 0.74m 1.64m 0.90m 0.39m 0.62m 0.62m 0.34m 0.67m   <= 60% 0.74m 1.54m 0.7m 0.63m 0.65m 0.23m 0.50m 0.39m  Arousal Index Apnea 0.0 21.4 11.8 9.7 12.7 18.9 10.6 5.2   Hypopnea 0.0 0.0 0.0 1.9 5.5 2.1 0.0 33.9   LM 0.0 0.0 0.0 0.0 0.0 2.1 0.0 0.0   Spontaneous 0.0 2.1 2.0 9.7 5.5 6.3 3.5 15.7  Pressure IPAP/EPAP 12 16 / 12 17 / 13 14 18  / 14   O2 Vol 0.0 0.0 0.0 0.0 0.0  Time TRT 31.49m 48.68m 73.58m 14.12m 96.20m   TST 31.81m 40.1m 68.33m 14.28m 94.62m  Sleep Stage % Wake 0.0 16.7 6.2 0.0 1.6   % REM 0.0 0.0 27.0 0.0 34.4   % N1 9.7 7.5 7.3 0.0 0.5   % N2 62.9 90.0 62.0 93.1 45.0   %  N3 27.4 2.5 3.6 6.9 20.1  Respiratory Total Events 38 28 23 16 28    Obs. Apn. 7 1 0 5 2   Mixed Apn. 3 0 0 3 0   Cen. Apn. 27 0 0 7 0   Hypopneas 1 27 23 1 26    AHI 73.55 42.00 20.15 66.21 17.78   Supine AHI 73.55 42.00 20.15 66.21 17.78   Prone AHI 0.00 0.00 0.00 0.00 0.00   Side AHI 0.00 0.00 0.00 0.00 0.00  Respiratory (4%) Hypopneas (4%) 1.00 13.00 9.00 0.00 11.00   AHI (4%) 73.55 21.00 7.88 62.07 8.25   Supine AHI (4%) 73.55 21.00 7.88 62.07 8.25   Prone AHI (4%) 0.00 0.00 0.00 0.00 0.00   Side AHI (4%) 0.00 0.00 0.00 0.00 0.00  Desat Profile <= 90% 0.68m 5.24m 0.54m 0.53m 0.23m   <= 80% 0.6m 4.67m 0.82m 0.91m 0.22m   <= 70% 0.56m 4.68m 0.80m 0.48m 0.55m   <= 60% 0.71m 4.30m 0.36m 0.16m 0.62m  Arousal Index Apnea 13.5 0.0 0.0 33.1 0.0   Hypopnea 1.9 4.5 2.6 0.0 0.6   LM 0.0 1.5 2.6 0.0 0.6   Spontaneous 0.0 3.0 6.1 12.4 2.5   [facility] PAP Titration Study  Summary     General Information  Name: Rajan, Burgard BMI: 33.91 Physician: Melvyn Novas, MD  ID: [id number] Height: 73.0 in Technician: Domingo Cocking, RPSGT  Sex: Male Weight: 257.0 lb Record: [id number]  Age: 26 [05/09/1955] Date: 12/18/2022     Medical & Medication History    Jeff Stewart is a 67 y.o. male patient who is seen upon referral on 10/06/2022 from Dr.Dettinger for a Sleep consultation. This patient has been dx with OSA many years ago but was never able to use an tolerate PAP therapy, had several nasal surgeries. He has not told us before , but he has undergone a HST ( not mentioned in his referral ) and he has seen ENT for inspire - and was deemed "not a good candidate" after an upper airway endoscopy. He has in the past undergone a UPPP in [place] , Dr Lovett Calender. HST result was not available for Dr. Jenne Pane either. Patient doesn't know who did this test. Who referred for this test? None is mentioned in EPIC? Chief concern according to patient : I need to find a way to treat  my apnea. The patient had the first sleep study in the year 2000 no results in EPIC none documented but he has pulmonary care, orthopedist is Dr Sharolyn Douglas, did his 5th and 6 th back surgery , had many neck surgeries. 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 Had Nissen fundoplication , this patient also has a morphine pump implanted, he has a spinal cord stimulator implanted which has been inactive for the last 2 or 3 years, he is s/p UPPP and 6 nasal plasties, he had a cervical anterior fusion  Tylenol, Zyrtec, Vitamin D, Flonase, Lasix, Hydrodiuril, Cozaar, Protonix, Zoloft, Depotestosterone Cypionate,  Turmeric, Tessalon perles   Sleep Disorder      Comments   Patient arrived for a CPAP titration polysomnogram. Procedure explained and all questions answered. Patient has not used any PAP therapy in a few years, as he did not tolerate it well. Patient was shown CPAP at 5 cm/H2O with a medium Simplus full face mask and an s/w Evora full face mask. CPAP started at 5 with the s/w Evora full face mask and pressure was increased to BiPAP ST 18/14 cm/H2O, BUR 10, in an effort to control respiratory events and abolish snoring. No obvious cardiac arrhythmias noted. Patient slept supine. Mild PLMS without significant sleep disruption was observed. Patient had one restroom visit.    CPAP start time: 10:10:04 PM CPAP end time: 05:23:45 AM   Time Total Supine Side Prone Upright  Recording (TRT) 7h 14.7m 7h 14.12m 0h 0.27m 0h 0.88m 0h 0.26m  Sleep (TST) 6h 55.74m 6h 55.69m 0h 0.41m 0h 0.12m 0h 0.76m   Latency N1 N2 N3 REM Onset Per. Slp. Eff.  Actual 1h 21.44m 0h 0.36m 1h 41.90m 0h 47.38m 0h 0.37m 0h 0.45m 95.62%   Stg Dur Wake N1 N2 N3 REM  Total 19.0 18.5 280.5 32.0 83.5  Supine 19.0 18.5 280.5 32.0 83.5  Side 0.0 0.0 0.0 0.0 0.0  Prone 0.0 0.0 0.0 0.0 0.0  Upright 0.0 0.0 0.0 0.0 0.0   Stg % Wake N1 N2 N3 REM  Total 4.4 4.5 67.6 7.7 20.1  Supine 4.4 4.5 67.6 7.7 20.1  Side 0.0 0.0  0.0 0.0 0.0  Prone 0.0 0.0 0.0 0.0 0.0  Upright 0.0 0.0 0.0 0.0 0.0     Apnea Summary Sub Supine Side Prone Upright  Total 173 Total 173 173 0 0 0    REM 11 11 0 0 0    NREM 162 162 0 0 0  Obs 91 REM 6 6 0 0 0    NREM 85 85 0 0 0  Mix 18 REM 2 2 0 0 0    NREM 16 16 0 0 0  Cen 64 REM 3 3 0 0 0    NREM 61 61 0 0 0   Rera Summary Sub Supine Side Prone Upright  Total 0 Total 0 0 0 0 0    REM 0 0 0 0 0    NREM 0 0 0 0 0   Hypopnea Summary Sub Supine Side Prone Upright  Total 136 Total 136 136 0 0 0    REM 19 19 0 0 0    NREM 117 117 0 0 0   4% Hypopnea Summary Sub Supine Side Prone Upright  Total (4%) 72 Total 72 72 0 0 0    REM 12 12 0 0 0    NREM 60 60 0 0 0     AHI Total Obs Mix Cen  44.67 Apnea 25.01 13.16 2.60 9.25   Hypopnea 19.66 -- -- --  35.42 Hypopnea (4%) 10.41 -- -- --    Total Supine Side Prone Upright  Position AHI 44.67 44.67 0.00 0.00 0.00  REM AHI 21.56   NREM AHI 50.57   Position RDI 44.67 44.67 0.00 0.00 0.00  REM RDI 21.56   NREM RDI 50.57    4% Hypopnea Total Supine Side Prone Upright  Position AHI (4%) 35.42 35.42 0.00 0.00 0.00  REM AHI (4%) 16.53   NREM AHI (4%) 40.24   Position RDI (4%) 35.42 35.42 0.00 0.00 0.00  REM  RDI (4%) 16.53   NREM RDI (4%) 40.24    Desaturation Information  <100% <90% <80% <70% <60% <50% <40%  Supine 283 7 0 0 0 0 0  Side 0 0 0 0 0 0 0  Prone 0 0 0 0 0 0 0  Upright 0 0 0 0 0 0 0  Total 283 7 0 0 0 0 0  Desaturation threshold setting: 3% Minimum desaturation setting: 10 seconds SaO2 nadir: 87%The longest event was a 53 sec obstructive Hypopnea with a minimum SaO2 of 95%. The lowest SaO2 was 87% associated with a 36 sec obstructive Apnea. EKG Rates EKG Avg Max Min  Awake 54 73 45  Asleep 51 79 43  EKG Events: Bradycardia in NSR  Awakening/Arousal Information # of Awakenings 13  Wake after sleep onset 19.20m  Wake after persistent sleep 19.20m   Arousal Assoc. Arousals Index  Apneas 50 7.2  Hypopneas 26  3.8  Leg Movements 6 0.9  Snore 0.0 0.0  PTT Arousals 0 0.0  Spontaneous 36 5.2  Total 116 16.8  PLMS LMs Index  Total LMs during PLMS 0 0.0       LM LMs Index  w/ Microarousal 6 0.9  w/ Awakening 2 0.3  w/ Resp Event 0 0.0  Spontaneous 35 5.1  Total 41 5.9

## 2022-12-27 ENCOUNTER — Telehealth: Payer: Self-pay | Admitting: Neurology

## 2022-12-27 NOTE — Telephone Encounter (Signed)
-----   Message from Jeff Stewart sent at 12/27/2022  2:13 PM EDT ----- The patient slept actually well through this turbulent night of increasing pressures on CPAP and BiPAP and did best at the highest explored BiPAP pressure of 18/ 14 cm water.  I would like to order the [facility] for him in size s/w as used during the sleep test.  He may benefit from the maximum setting of BiPAP available: 20 / 16 cm water.

## 2022-12-27 NOTE — Telephone Encounter (Signed)
I called pt's wife. I advised pt that Dr. Vickey Huger reviewed their sleep study results and found that pt was set up with a BiPAP and while there was reduction in apnea events it wasn't completely treated. Dr. Vickey Huger recommends that pt starts a BiPAP at maximum pressure 20/16 cm water pressure. I reviewed PAP compliance expectations with the pt's wife. Pt's wife is agreeable to pt starting a BiPAP. I advised pt that an order will be sent to a DME, [facility], and [facility] will call the pt within about one week after they file with the pt's insurance. [facility] will show the pt how to use the machine, fit for masks, and troubleshoot the CPAP if needed. A follow up appt was made for insurance purposes with Dr. Vickey Huger on 03/29/2023 at 9:30 am. Pt verbalized understanding to arrive 15 minutes early and bring their CPAP. Pt verbalized understanding of results. Pt had no questions at this time but was encouraged to call back if questions arise. I have sent the order to [facility] and have received confirmation that they have received the order.

## 2023-01-04 DIAGNOSIS — M51362 Other intervertebral disc degeneration, lumbar region with discogenic back pain and lower extremity pain: Secondary | ICD-10-CM | POA: Diagnosis not present

## 2023-01-04 DIAGNOSIS — G894 Chronic pain syndrome: Secondary | ICD-10-CM | POA: Diagnosis not present

## 2023-01-04 DIAGNOSIS — M5030 Other cervical disc degeneration, unspecified cervical region: Secondary | ICD-10-CM | POA: Diagnosis not present

## 2023-01-04 DIAGNOSIS — M961 Postlaminectomy syndrome, not elsewhere classified: Secondary | ICD-10-CM | POA: Diagnosis not present

## 2023-01-11 ENCOUNTER — Other Ambulatory Visit: Payer: Self-pay | Admitting: "Endocrinology

## 2023-01-11 ENCOUNTER — Telehealth: Payer: Self-pay

## 2023-01-11 DIAGNOSIS — E291 Testicular hypofunction: Secondary | ICD-10-CM

## 2023-01-11 NOTE — Telephone Encounter (Signed)
Pt called requesting a refill for testosterone.

## 2023-01-12 ENCOUNTER — Other Ambulatory Visit: Payer: Self-pay | Admitting: "Endocrinology

## 2023-01-12 DIAGNOSIS — E291 Testicular hypofunction: Secondary | ICD-10-CM

## 2023-01-12 MED ORDER — TESTOSTERONE CYPIONATE 200 MG/ML IM SOLN
INTRAMUSCULAR | 0 refills | Status: DC
Start: 2023-01-12 — End: 2023-05-16

## 2023-01-13 ENCOUNTER — Other Ambulatory Visit (HOSPITAL_COMMUNITY): Payer: Self-pay | Admitting: Orthopaedic Surgery

## 2023-01-13 DIAGNOSIS — M5416 Radiculopathy, lumbar region: Secondary | ICD-10-CM

## 2023-01-16 ENCOUNTER — Other Ambulatory Visit: Payer: Medicare Other

## 2023-01-18 ENCOUNTER — Other Ambulatory Visit: Payer: Medicare Other

## 2023-01-18 DIAGNOSIS — I10 Essential (primary) hypertension: Secondary | ICD-10-CM | POA: Diagnosis not present

## 2023-01-21 LAB — LIPID PANEL
Chol/HDL Ratio: 3.7 {ratio} (ref 0.0–5.0)
Cholesterol, Total: 166 mg/dL (ref 100–199)
HDL: 45 mg/dL (ref >39)
LDL Chol Calc (NIH): 100 mg/dL — ABNORMAL HIGH (ref 0–99)
Triglycerides: 117 mg/dL (ref 0–149)
VLDL Cholesterol Cal: 21 mg/dL (ref 5–40)

## 2023-01-21 LAB — TESTOSTERONE, FREE, TOTAL, SHBG
Sex Hormone Binding: 23 nmol/L (ref 19.3–76.4)
Testosterone, Free: 10.7 pg/mL (ref 6.6–18.1)
Testosterone: 475 ng/dL (ref 264–916)

## 2023-01-26 ENCOUNTER — Ambulatory Visit: Payer: Medicare Other | Admitting: "Endocrinology

## 2023-01-26 ENCOUNTER — Encounter: Payer: Self-pay | Admitting: "Endocrinology

## 2023-01-26 VITALS — BP 138/82 | HR 60 | Ht 73.0 in | Wt 262.8 lb

## 2023-01-26 DIAGNOSIS — E559 Vitamin D deficiency, unspecified: Secondary | ICD-10-CM

## 2023-01-26 DIAGNOSIS — E291 Testicular hypofunction: Secondary | ICD-10-CM

## 2023-01-26 NOTE — Progress Notes (Signed)
01/26/2023                   Endocrinology follow-up note  Subjective:    Patient ID: Jeff Stewart, male    DOB: [birth year], PCP Jeff Stewart   Past Medical History:  Diagnosis Date   Anxiety    Carpal tunnel syndrome, bilateral    Cataract    removed years ago   Chronic back pain    internal morphine pump   DDD (degenerative disc disease)    neck, lumbar   Depression    Dyslipidemia    diet controlled   GERD (gastroesophageal reflux disease)    past hx- had nissen fundiplication    Hypertension    borderline   Sleep apnea    wears C-PAP   Status post insertion of spinal cord stimulator    Past Surgical History:  Procedure Laterality Date   BACK SURGERY     x 6   CARPAL TUNNEL RELEASE     bilateral   COLONOSCOPY     ELBOW SURGERY     INGUINAL HERNIA REPAIR Right    INTERNAL MORPHINE PUMP      KNEE ARTHROSCOPY     NASAL SEPTUM SURGERY     x 6   neck fusion      NISSEN FUNDOPLICATION     SKIN CANCER EXCISION     SPINAL CORD STIMULATOR INSERTION     STOMACH SURGERY     Nissen Fundiplication   thumb surgery     TOTAL KNEE ARTHROPLASTY Left 04/24/2018   Procedure: TOTAL KNEE ARTHROPLASTY;  Surgeon: Jeff Apley, Stewart;  Location: [facility];  Service: Orthopedics;  Laterality: Left;   UPPER GASTROINTESTINAL ENDOSCOPY     Social History   Socioeconomic History   Marital status: Married    Spouse name: Not on file   Number of children: 3   Years of education: Not on file   Highest education level: Not on file  Occupational History   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  Tobacco Use   Smoking status: Never   Smokeless tobacco: Never  Vaping Use   Vaping status: Never Used  Substance and Sexual Activity   Alcohol use: No   Drug use: No   Sexual activity: Not Currently    Birth control/protection: Post-menopausal    Comment: married for 1972  Other Topics Concern   Not on file  Social History Narrative   Lives with wife     Children live nearby   Social Determinants of Health   Financial Resource Strain: Low Risk  (08/04/2022)   Overall Financial Resource Strain (CARDIA)    Difficulty of Paying Living Expenses: Not hard at all  Food Insecurity: No Food Insecurity (08/04/2022)   Hunger Vital Sign    Worried About Running Out of Food in the Last Year: Never true    Ran Out of Food in the Last Year: Never true  Transportation Needs: No Transportation Needs (08/04/2022)   PRAPARE - Administrator, Civil Service (Medical): No    Lack of Transportation (Non-Medical): No  Physical Activity: Insufficiently Active (08/04/2022)   Exercise Vital Sign    Days of Exercise per Week: 3 days    Minutes of Exercise per Session: 30 min  Stress: No Stress Concern Present (08/04/2022)   [facility] [employer] - Occupational Stress Questionnaire    Feeling of Stress : Not at all  Social Connections:  Moderately Integrated (08/04/2022)   Social Connection and Isolation Panel [NHANES]    Frequency of Communication with Friends and Family: More than three times a week    Frequency of Social Gatherings with Friends and Family: More than three times a week    Attends Religious Services: More than 4 times per year    Active Member of [employer] or Organizations: No    Attends Banker Meetings: Never    Marital Status: Married   Outpatient Encounter Medications as of 01/26/2023  Medication Sig   acetaminophen (TYLENOL) 500 MG tablet Take 500 mg by mouth every 6 (six) hours as needed.   benzonatate (TESSALON PERLES) 100 MG capsule Take 1 capsule (100 mg total) by mouth 3 (three) times daily as needed for cough. (Patient not taking: Reported on 10/06/2022)   cetirizine (ZYRTEC) 10 MG tablet Take 1 tablet (10 mg total) by mouth daily.   Cholecalciferol (VITAMIN D) 50 MCG (2000 UT) CAPS Take 2,000 Units by mouth daily with lunch.   fluticasone (FLONASE) 50 MCG/ACT nasal spray Place 2 sprays into both  nostrils daily.   furosemide (LASIX) 40 MG tablet Take 1 tablet (40 mg total) by mouth every morning.   hydrochlorothiazide (HYDRODIURIL) 25 MG tablet Take 1 tablet (25 mg total) by mouth daily. TAKE 1 TABLET DAILY.   losartan (COZAAR) 50 MG tablet Take 1 tablet (50 mg total) by mouth 2 (two) times daily.   pantoprazole (PROTONIX) 40 MG tablet Take 1 tablet (40 mg total) by mouth daily.   sertraline (ZOLOFT) 100 MG tablet Take 1.5 tablets (150 mg total) by mouth daily.   Syringe/Needle, Disp, (SYRINGE 3CC/21GX1-1/4") 21G X 1-1/4" 3 ML MISC 1 each by Does not apply route once a week.   testosterone cypionate (DEPOTESTOSTERONE CYPIONATE) 200 MG/ML injection INJECT 0.5 MLS INTRAMUSCULARY EVERY 7 DAYS   Turmeric 500 MG CAPS Take by mouth.   No facility-administered encounter medications on file as of 01/26/2023.   ALLERGIES: Allergies  Allergen Reactions   Nalbuphine Nausea And Vomiting, Rash and Shortness Of Breath   Nubain [Nalbuphine Hcl] Rash   VACCINATION STATUS: Immunization History  Administered Date(s) Administered   Fluad Quad(high Dose 65+) 12/21/2018, 12/22/2020, 01/07/2022   Influenza Split 01/19/2014   Influenza,inj,Quad PF,6+ Mos 01/14/2015, 02/02/2016, 02/08/2018   Influenza-Unspecified 02/19/2014, 02/24/2020   Moderna Sars-Covid-2 Vaccination 05/12/2019, 05/23/2019, 03/10/2020   Pneumococcal Conjugate-13 08/09/2019   Pneumococcal Polysaccharide-23 04/23/2020   Tdap 12/11/2014   Zoster Recombinant(Shingrix) 04/23/2020, 10/08/2020    HPI Jeff Stewart is a 67 year old gentleman with a medical history as above.  He is being seen in follow-up for hypogonadism on testosterone replacement therapy.   -He remains on testosterone cypionate .  He is currently on testosterone 100 mg IM every 7 days.  His previsit labs show total testosterone acceptable at 475.    No other major interval medical history.  He has no new complaints today.  -He reports better energy level, better  mobility. He was known to have low testosterone for at least since age 60.   No evidence of adverse effects from testosterone treatment.  He wishes to be continued on testosterone placement therapy. He fathers 3 grown children. He denies history of head injury , has had nasal septal surgery multiple times. He denies injury to the testicles, exposure to chemotherapy, exposure to radiation to the genitals. However, due to chronic back injury and surgery he has exposure to heavy opioid therapy for pain control. He is currently on morphine pump.  He has been off and  on  opioids for pain control for the last 10-15 years.  He struggles with sleep apnea, even changing CPAP machines did not help much.  Review of Systems Limited as above.  Objective:    BP 138/82   Pulse 60   Ht 6\' 1"  (1.854 m)   Wt 262 lb 12.8 oz (119.2 kg)   BMI 34.67 kg/m   Wt Readings from Last 3 Encounters:  01/26/23 262 lb 12.8 oz (119.2 kg)  10/06/22 257 lb (116.6 kg)  09/20/22 261 lb 12.8 oz (118.8 kg)      From previous exam.  Genitourinary system: He has bilaterally shrunk testicles, right testes 12 mL, left testes 15 mL. No scrotal mass, no varicocele,  no inguinal lymphadenopathy.    Recent Results (from the past 2160 hour(s))  Lipid panel     Status: Abnormal   Collection Time: 01/18/23  9:05 AM  Result Value Ref Range   Cholesterol, Total 166 100 - 199 mg/dL   Triglycerides 161 0 - 149 mg/dL   HDL 45 >09 mg/dL   VLDL Cholesterol Cal 21 5 - 40 mg/dL   LDL Chol Calc (NIH) 604 (H) 0 - 99 mg/dL   Chol/HDL Ratio 3.7 0.0 - 5.0 ratio    Comment:                                   T. Chol/HDL Ratio                                             Men  Women                               1/2 Avg.Risk  3.4    3.3                                   Avg.Risk  5.0    4.4                                2X Avg.Risk  9.6    7.1                                3X Avg.Risk 23.4   11.0   Testosterone, Free, Total, SHBG      Status: None   Collection Time: 01/18/23  9:05 AM  Result Value Ref Range   Testosterone 475 264 - 916 ng/dL    Comment: Adult male reference interval is based on a population of healthy nonobese males (BMI <30) between 14 and 9 years old. Travison, et.al. JCEM [phone]. PMID: [id number].    Testosterone, Free 10.7 6.6 - 18.1 pg/mL   Sex Hormone Binding 23.0 19.3 - 76.4 nmol/L     Assessment & Plan:   1. Hypogonadism  2.  Obesity/sleep apnea  3.  Vitamin D deficiency.  -Testosterone replacement helped improve his testosterone from 74 ->286 -> 782 -> 509 > 978 > 659-> 437 >494> 327-> 506-> 640 -> 673 -> 529 -> 446 -> 340 ->  488 ->325 -> 700 -> 135 -> 228 -> 624 -> 475   No adverse events noted. 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   -It is possible that it is multifactorial including exposure to heavy opioids, multiple back surgeries, prior inguinal surgery, or idiopathic.  -Prolactin level is favorable at 6.1, he will not need pituitary/sella imaging for now.  -He has benefited from testosterone replacement therapy and he wishes to be continued on treatment.     -He is advised to continue he is testosterone at 100 mg IM every 7 days  to give him a total of 400 mg monthly.   Target testosterone level 300-600 mcg/dL.   -He we will return in 48-months with total and free testosterone measurements.    Regarding his sleep apnea: He would benefit the most from weight loss.  - he acknowledges that there is a room for improvement in his food and drink choices. - Suggestion is made for him to avoid simple carbohydrates  from his diet including Cakes, Sweet Desserts, Ice Cream, Soda (diet and regular), Sweet Tea, Candies, Chips, Cookies, Store Bought Juices, Alcohol in Excess of  1-2 drinks a day, Artificial Sweeteners,  Coffee Creamer, and "Sugar-free" Products, Lemonade. This will help patient  avoid  unintended weight gain.  He is advised to continue vitamin D supplement 2000 units daily.  He will continue to have above target LDL at 100, advised to limit intake of animal products including processed meat, cheese and full fat daily.    - I advised patient to maintain close follow up with Jeff Stewart for primary care needs.    I spent  22  minutes in the care of the patient today including review of labs from Thyroid Function, CMP, and other relevant labs ; imaging/biopsy records (current and previous including abstractions from other facilities); face-to-face time discussing  his lab results and symptoms, medications doses, his options of short and long term treatment based on the latest standards of care / guidelines;   and documenting the encounter.  Vilma Prader Leavitt  participated in the discussions, expressed understanding, and voiced agreement with the above plans.  All questions were answered to his satisfaction. he is encouraged to contact clinic should he have any questions or concerns prior to his return visit.    Follow up plan: Return in about 4 months (around 05/26/2023) for Fasting Labs  in AM B4 8.  Marquis Lunch, Stewart Phone: [phone]  Fax: [phone]   This note was partially dictated with voice recognition software. Similar sounding words can be transcribed inadequately or may not  be corrected upon review.  01/26/2023, 1:46 PM

## 2023-02-10 NOTE — Telephone Encounter (Signed)
Pt's wife,Lucy calling, [facility] said they have not received an  order for BIPAP. [facility] stated would get touch with [facility]. Would like a call back

## 2023-02-13 ENCOUNTER — Ambulatory Visit: Payer: Medicare Other | Admitting: Nurse Practitioner

## 2023-02-13 ENCOUNTER — Encounter: Payer: Self-pay | Admitting: Nurse Practitioner

## 2023-02-13 VITALS — BP 139/87 | HR 58 | Temp 98.0°F | Resp 20 | Ht 73.0 in | Wt 260.0 lb

## 2023-02-13 DIAGNOSIS — R5383 Other fatigue: Secondary | ICD-10-CM

## 2023-02-13 DIAGNOSIS — Z23 Encounter for immunization: Secondary | ICD-10-CM | POA: Diagnosis not present

## 2023-02-13 NOTE — Progress Notes (Signed)
Subjective:    Patient ID: Jeff Stewart, male    DOB: 10/16/1955, 67 y.o.   MRN: [id number]   Chief Complaint: No energy   HPI  Patient comes in today c/o fatigue for 3 months. Not much change. Last blood work was normal. Had recent sleep study and was prescribed BiPAP. He has still not gotten machine and he says he is not sleeping well.  Patient Active Problem List   Diagnosis Date Noted   History of bilevel positive airway pressure (BiPAP) therapy 10/06/2022   Intolerance of continuous positive airway pressure (CPAP) ventilation 10/06/2022   S/P UPPP (uvulopalatopharyngoplasty) 10/06/2022   Vitamin D deficiency 12/07/2021   Primary localized osteoarthritis of knee 04/24/2018   Primary osteoarthritis of left knee 04/02/2018   OSA (obstructive sleep apnea) 04/02/2018   GERD (gastroesophageal reflux disease) 02/07/2018   Hypogonadism in male 04/03/2015   Essential hypertension, benign 03/04/2015   Anxiety and depression 02/23/2015   Chronic back pain 01/14/2015   History of back surgery 07/31/2014   Fatigue 10/21/2013   Vertigo 05/20/2013   Radicular pain of lower extremity 10/29/2012       Review of Systems  Constitutional:  Negative for diaphoresis.  Eyes:  Negative for pain.  Respiratory:  Negative for shortness of breath.   Cardiovascular:  Negative for chest pain, palpitations and leg swelling.  Gastrointestinal:  Negative for abdominal pain.  Endocrine: Negative for polydipsia.  Skin:  Negative for rash.  Neurological:  Negative for dizziness, weakness and headaches.  Hematological:  Does not bruise/bleed easily.  All other systems reviewed and are negative.      Objective:   Physical Exam Vitals and nursing note reviewed.  Constitutional:      Appearance: Normal appearance. He is well-developed.  HENT:     Head: Normocephalic.     Nose: Nose normal.     Mouth/Throat:     Mouth: Mucous membranes are moist.     Pharynx: Oropharynx is clear.  Eyes:      Pupils: Pupils are equal, round, and reactive to light.  Neck:     Thyroid: No thyroid mass or thyromegaly.     Vascular: No carotid bruit or JVD.     Trachea: Phonation normal.  Cardiovascular:     Rate and Rhythm: Normal rate and regular rhythm.  Pulmonary:     Effort: Pulmonary effort is normal. No respiratory distress.     Breath sounds: Normal breath sounds.  Abdominal:     General: Bowel sounds are normal.     Palpations: Abdomen is soft.     Tenderness: There is no abdominal tenderness.  Musculoskeletal:        General: Normal range of motion.     Cervical back: Normal range of motion and neck supple.  Lymphadenopathy:     Cervical: No cervical adenopathy.  Skin:    General: Skin is warm and dry.  Neurological:     Mental Status: He is alert and oriented to person, place, and time.  Psychiatric:        Behavior: Behavior normal.        Thought Content: Thought content normal.        Judgment: Judgment normal.    BP 139/87   Pulse (!) 58   Temp 98 F (36.7 C) (Temporal)   Resp 20   Ht 6\' 1"  (1.854 m)   Wt 260 lb (117.9 kg)   SpO2 97%   BMI 34.30 kg/m  Assessment & Plan:   Jeff Stewart in today with chief complaint of No energy   1. Fatigue, unspecified type Labs pending' call about bipap machine - CBC with Differential/Platelet - Thyroid Panel With TSH    The above assessment and management plan was discussed with the patient. The patient verbalized understanding of and has agreed to the management plan. Patient is aware to call the clinic if symptoms persist or worsen. Patient is aware when to return to the clinic for a follow-up visit. Patient educated on when it is appropriate to go to the emergency department.   Mary-Margaret Daphine Deutscher, FNP

## 2023-02-13 NOTE — Telephone Encounter (Signed)
I have routed a message to the [facility] team because according to our records, it looks like they have received the order and confirmed in epic of receiving it.

## 2023-02-13 NOTE — Patient Instructions (Signed)
Fatigue If you have fatigue, you feel tired all the time and have a lack of energy or a lack of motivation. Fatigue may make it difficult to start or complete tasks because of exhaustion. Occasional or mild fatigue is often a normal response to activity or life. However, long-term (chronic) or extreme fatigue may be a symptom of a medical condition such as: Depression. Not having enough red blood cells or hemoglobin in the blood (anemia). A problem with a small gland located in the lower front part of the neck (thyroid disorder). Rheumatologic conditions. These are problems related to the body's defense system (immune system). Infections, especially certain viral infections. Fatigue can also lead to negative health outcomes over time. Follow these instructions at home: Medicines Take over-the-counter and prescription medicines only as told by your health care provider. Take a multivitamin if told by your health care provider. Do not use herbal or dietary supplements unless they are approved by your health care provider. Eating and drinking  Avoid heavy meals in the evening. Eat a well-balanced diet, which includes lean proteins, whole grains, plenty of fruits and vegetables, and low-fat dairy products. Avoid eating or drinking too many products with caffeine in them. Avoid alcohol. Drink enough fluid to keep your urine pale yellow. Activity  Exercise regularly, as told by your health care provider. Use or practice techniques to help you relax, such as yoga, tai chi, meditation, or massage therapy. Lifestyle Change situations that cause you stress. Try to keep your work and personal schedules in balance. Do not use recreational or illegal drugs. General instructions Monitor your fatigue for any changes. Go to bed and get up at the same time every day. Avoid fatigue by pacing yourself during the day and getting enough sleep at night. Maintain a healthy weight. Contact a health care  provider if: Your fatigue does not get better. You have a fever. You suddenly lose or gain weight. You have headaches. You have trouble falling asleep or sleeping through the night. You feel angry, guilty, anxious, or sad. You have swelling in your legs or another part of your body. Get help right away if: You feel confused, feel like you might faint, or faint. Your vision is blurry or you have a severe headache. You have severe pain in your abdomen, your back, or the area between your waist and hips (pelvis). You have chest pain, shortness of breath, or an irregular or fast heartbeat. You are unable to urinate, or you urinate less than normal. You have abnormal bleeding from the rectum, nose, lungs, nipples, or, if you are male, the vagina. You vomit blood. You have thoughts about hurting yourself or others. These symptoms may be an emergency. Get help right away. Call 911. Do not wait to see if the symptoms will go away. Do not drive yourself to the hospital. Get help right away if you feel like you may hurt yourself or others, or have thoughts about taking your own life. Go to your nearest emergency room or: Call 911. Call the National Suicide Prevention Lifeline at 912-762-5680 or 988. This is open 24 hours a day. Text the Crisis Text Line at 6300320540. Summary If you have fatigue, you feel tired all the time and have a lack of energy or a lack of motivation. Fatigue may make it difficult to start or complete tasks because of exhaustion. Long-term (chronic) or extreme fatigue may be a symptom of a medical condition. Exercise regularly, as told by your health care provider.  Change situations that cause you stress. Try to keep your work and personal schedules in balance. This information is not intended to replace advice given to you by your health care provider. Make sure you discuss any questions you have with your health care provider. Document Revised: 12/28/2020 Document  Reviewed: 12/28/2020 Elsevier Patient Education  2024 [employer].

## 2023-02-13 NOTE — Telephone Encounter (Signed)
The order was received by [facility] but due to insurance they have to process everything through [facility]. Per manager, [facility] is just now giving them the green light to push his order through. They will make sure everything is good to go and contact the patient to get him scheduled.

## 2023-02-14 LAB — CBC WITH DIFFERENTIAL/PLATELET
Basophils Absolute: 0 10*3/uL (ref 0.0–0.2)
Basos: 1 %
EOS (ABSOLUTE): 0.2 10*3/uL (ref 0.0–0.4)
Eos: 4 %
Hematocrit: 47.4 % (ref 37.5–51.0)
Hemoglobin: 14.9 g/dL (ref 13.0–17.7)
Immature Grans (Abs): 0 10*3/uL (ref 0.0–0.1)
Immature Granulocytes: 0 %
Lymphocytes Absolute: 1.4 10*3/uL (ref 0.7–3.1)
Lymphs: 29 %
MCH: 27.5 pg (ref 26.6–33.0)
MCHC: 31.4 g/dL — ABNORMAL LOW (ref 31.5–35.7)
MCV: 88 fL (ref 79–97)
Monocytes Absolute: 0.5 10*3/uL (ref 0.1–0.9)
Monocytes: 11 %
Neutrophils Absolute: 2.7 10*3/uL (ref 1.4–7.0)
Neutrophils: 55 %
Platelets: 186 10*3/uL (ref 150–450)
RBC: 5.42 x10E6/uL (ref 4.14–5.80)
RDW: 13.6 % (ref 11.6–15.4)
WBC: 4.9 10*3/uL (ref 3.4–10.8)

## 2023-02-14 LAB — THYROID PANEL WITH TSH
Free Thyroxine Index: 1.7 (ref 1.2–4.9)
T3 Uptake Ratio: 23 % — ABNORMAL LOW (ref 24–39)
T4, Total: 7.4 ug/dL (ref 4.5–12.0)
TSH: 2.97 u[IU]/mL (ref 0.450–4.500)

## 2023-02-19 HISTORY — PX: SPINAL CORD STIMULATOR REMOVAL: SHX2423

## 2023-02-24 DIAGNOSIS — M961 Postlaminectomy syndrome, not elsewhere classified: Secondary | ICD-10-CM | POA: Diagnosis not present

## 2023-02-24 DIAGNOSIS — I10 Essential (primary) hypertension: Secondary | ICD-10-CM | POA: Diagnosis not present

## 2023-02-24 DIAGNOSIS — Z4542 Encounter for adjustment and management of neuropacemaker (brain) (peripheral nerve) (spinal cord): Secondary | ICD-10-CM | POA: Diagnosis not present

## 2023-02-24 DIAGNOSIS — K219 Gastro-esophageal reflux disease without esophagitis: Secondary | ICD-10-CM | POA: Diagnosis not present

## 2023-02-24 DIAGNOSIS — Z4549 Encounter for adjustment and management of other implanted nervous system device: Secondary | ICD-10-CM | POA: Diagnosis not present

## 2023-02-24 DIAGNOSIS — G4730 Sleep apnea, unspecified: Secondary | ICD-10-CM | POA: Diagnosis not present

## 2023-02-24 DIAGNOSIS — M1990 Unspecified osteoarthritis, unspecified site: Secondary | ICD-10-CM | POA: Diagnosis not present

## 2023-03-03 DIAGNOSIS — M961 Postlaminectomy syndrome, not elsewhere classified: Secondary | ICD-10-CM | POA: Diagnosis not present

## 2023-03-03 DIAGNOSIS — M51362 Other intervertebral disc degeneration, lumbar region with discogenic back pain and lower extremity pain: Secondary | ICD-10-CM | POA: Diagnosis not present

## 2023-03-03 DIAGNOSIS — Z978 Presence of other specified devices: Secondary | ICD-10-CM | POA: Diagnosis not present

## 2023-03-03 DIAGNOSIS — G894 Chronic pain syndrome: Secondary | ICD-10-CM | POA: Diagnosis not present

## 2023-03-07 ENCOUNTER — Other Ambulatory Visit: Payer: Self-pay | Admitting: Orthopaedic Surgery

## 2023-03-07 DIAGNOSIS — M5416 Radiculopathy, lumbar region: Secondary | ICD-10-CM

## 2023-03-08 DIAGNOSIS — G894 Chronic pain syndrome: Secondary | ICD-10-CM | POA: Diagnosis not present

## 2023-03-08 DIAGNOSIS — M961 Postlaminectomy syndrome, not elsewhere classified: Secondary | ICD-10-CM | POA: Diagnosis not present

## 2023-03-08 DIAGNOSIS — M51362 Other intervertebral disc degeneration, lumbar region with discogenic back pain and lower extremity pain: Secondary | ICD-10-CM | POA: Diagnosis not present

## 2023-03-08 DIAGNOSIS — Z79899 Other long term (current) drug therapy: Secondary | ICD-10-CM | POA: Diagnosis not present

## 2023-03-08 DIAGNOSIS — Z5181 Encounter for therapeutic drug level monitoring: Secondary | ICD-10-CM | POA: Diagnosis not present

## 2023-03-08 DIAGNOSIS — M5030 Other cervical disc degeneration, unspecified cervical region: Secondary | ICD-10-CM | POA: Diagnosis not present

## 2023-03-20 ENCOUNTER — Encounter (HOSPITAL_BASED_OUTPATIENT_CLINIC_OR_DEPARTMENT_OTHER): Payer: Self-pay | Admitting: Emergency Medicine

## 2023-03-20 ENCOUNTER — Other Ambulatory Visit: Payer: Self-pay

## 2023-03-20 ENCOUNTER — Emergency Department (HOSPITAL_BASED_OUTPATIENT_CLINIC_OR_DEPARTMENT_OTHER)
Admission: EM | Admit: 2023-03-20 | Discharge: 2023-03-20 | Disposition: A | Discharge status: Home / Self Care | Payer: Medicare Other | Attending: Emergency Medicine | Admitting: Emergency Medicine

## 2023-03-20 DIAGNOSIS — R5383 Other fatigue: Secondary | ICD-10-CM | POA: Insufficient documentation

## 2023-03-20 DIAGNOSIS — M549 Dorsalgia, unspecified: Secondary | ICD-10-CM | POA: Diagnosis not present

## 2023-03-20 DIAGNOSIS — Z79899 Other long term (current) drug therapy: Secondary | ICD-10-CM | POA: Insufficient documentation

## 2023-03-20 DIAGNOSIS — G4733 Obstructive sleep apnea (adult) (pediatric): Secondary | ICD-10-CM | POA: Diagnosis not present

## 2023-03-20 DIAGNOSIS — I10 Essential (primary) hypertension: Secondary | ICD-10-CM | POA: Diagnosis not present

## 2023-03-20 DIAGNOSIS — R202 Paresthesia of skin: Secondary | ICD-10-CM | POA: Insufficient documentation

## 2023-03-20 DIAGNOSIS — R0789 Other chest pain: Secondary | ICD-10-CM | POA: Insufficient documentation

## 2023-03-20 DIAGNOSIS — Z20822 Contact with and (suspected) exposure to covid-19: Secondary | ICD-10-CM | POA: Insufficient documentation

## 2023-03-20 DIAGNOSIS — G8929 Other chronic pain: Secondary | ICD-10-CM | POA: Diagnosis not present

## 2023-03-20 LAB — CBC
HCT: 48.1 % (ref 39.0–52.0)
Hemoglobin: 15.8 g/dL (ref 13.0–17.0)
MCH: 28.3 pg (ref 26.0–34.0)
MCHC: 32.8 g/dL (ref 30.0–36.0)
MCV: 86 fL (ref 80.0–100.0)
Platelets: 206 10*3/uL (ref 150–400)
RBC: 5.59 MIL/uL (ref 4.22–5.81)
RDW: 13.4 % (ref 11.5–15.5)
WBC: 6.9 10*3/uL (ref 4.0–10.5)
nRBC: 0 % (ref 0.0–0.2)

## 2023-03-20 LAB — RESP PANEL BY RT-PCR (RSV, FLU A&B, COVID)  RVPGX2
Influenza A by PCR: NEGATIVE
Influenza B by PCR: NEGATIVE
Resp Syncytial Virus by PCR: NEGATIVE
SARS Coronavirus 2 by RT PCR: NEGATIVE

## 2023-03-20 LAB — BASIC METABOLIC PANEL
Anion gap: 10 (ref 5–15)
BUN: 16 mg/dL (ref 8–23)
CO2: 30 mmol/L (ref 22–32)
Calcium: 9.7 mg/dL (ref 8.9–10.3)
Chloride: 99 mmol/L (ref 98–111)
Creatinine, Ser: 0.93 mg/dL (ref 0.61–1.24)
GFR, Estimated: >60 mL/min (ref >60)
Glucose, Bld: 126 mg/dL — ABNORMAL HIGH (ref 70–99)
Potassium: 3.6 mmol/L (ref 3.5–5.1)
Sodium: 139 mmol/L (ref 135–145)

## 2023-03-20 NOTE — ED Provider Notes (Signed)
[place] EMERGENCY DEPARTMENT AT [facility] Provider Note   CSN: [id number] Arrival date & time: 03/20/23  1441     History  No chief complaint on file.   Jeff Stewart is a 67 y.o. male.  HPI 67 year old male presents with fatigue.  He has been dealing with this for 2-3 months according to him and wife.  Seems to be a little worse over the last few days.  He just feels tired all the time despite getting sleep.  Was recently put on CPAP as it was thought that his OSA was the cause.  He has chronic tingling in his hands from his neck but no new weakness or numbness.  He denies any focal weakness.  He denies headache, chest pain, shortness of breath (triage note indicates shortness of breath but he denies this), abdominal pain.  He has chronic back pain but this is baseline.  Home Medications Prior to Admission medications   Medication Sig Start Date End Date Taking? Authorizing Provider  acetaminophen (TYLENOL) 500 MG tablet Take 500 mg by mouth every 6 (six) hours as needed.    [provider]  benzonatate (TESSALON PERLES) 100 MG capsule Take 1 capsule (100 mg total) by mouth 3 (three) times daily as needed for cough. 05/10/22   Raliegh Ip, DO  cetirizine (ZYRTEC) 10 MG tablet Take 1 tablet (10 mg total) by mouth daily. 04/12/21   Dettinger, Elige Radon, MD  Cholecalciferol (VITAMIN D) 50 MCG (2000 UT) CAPS Take 2,000 Units by mouth daily with lunch.    [provider]  fluticasone (FLONASE) 50 MCG/ACT nasal spray Place 2 sprays into both nostrils daily. 04/06/20   Dettinger, Elige Radon, MD  furosemide (LASIX) 40 MG tablet Take 1 tablet (40 mg total) by mouth every morning. 08/31/22   Dettinger, Elige Radon, MD  hydrochlorothiazide (HYDRODIURIL) 25 MG tablet Take 1 tablet (25 mg total) by mouth daily. TAKE 1 TABLET DAILY. 08/31/22   Dettinger, Elige Radon, MD  losartan (COZAAR) 50 MG tablet Take 1 tablet (50 mg total) by mouth 2 (two) times daily. 07/05/22    Dettinger, Elige Radon, MD  pantoprazole (PROTONIX) 40 MG tablet Take 1 tablet (40 mg total) by mouth daily. 08/31/22   Dettinger, Elige Radon, MD  sertraline (ZOLOFT) 100 MG tablet Take 1.5 tablets (150 mg total) by mouth daily. 08/31/22   Dettinger, Elige Radon, MD  Syringe/Needle, Disp, (SYRINGE 3CC/21GX1-1/4") 21G X 1-1/4" 3 ML MISC 1 each by Does not apply route once a week. 07/10/20   Roma Kayser, MD  testosterone cypionate (DEPOTESTOSTERONE CYPIONATE) 200 MG/ML injection INJECT 0.5 MLS INTRAMUSCULARY EVERY 7 DAYS 01/12/23   Roma Kayser, MD  Turmeric 500 MG CAPS Take by mouth.    [provider]      Allergies    Nalbuphine and Nubain [nalbuphine hcl]    Review of Systems   Review of Systems  Constitutional:  Positive for fatigue.  Respiratory:  Negative for shortness of breath.   Cardiovascular:  Negative for chest pain.  Gastrointestinal:  Negative for abdominal pain.  Neurological:  Positive for numbness (chronic tingling). Negative for weakness and headaches.    Physical Exam Updated Vital Signs BP (!) 154/88 (BP Location: Right Arm)   Pulse 76   Temp 97.8 F (36.6 C) (Oral)   Resp 17   SpO2 98%  Physical Exam Vitals and nursing note reviewed.  Constitutional:      General: He is not in acute distress.  Appearance: He is well-developed. He is not ill-appearing or diaphoretic.  HENT:     Head: Normocephalic and atraumatic.  Eyes:     Extraocular Movements: Extraocular movements intact.     Pupils: Pupils are equal, round, and reactive to light.  Cardiovascular:     Rate and Rhythm: Normal rate and regular rhythm.     Heart sounds: Normal heart sounds.  Pulmonary:     Effort: Pulmonary effort is normal.     Breath sounds: Normal breath sounds.  Abdominal:     Palpations: Abdomen is soft.     Tenderness: There is no abdominal tenderness.  Skin:    General: Skin is warm and dry.  Neurological:     Mental Status: He is alert.     Comments: CN  3-12 grossly intact. 5/5 strength in all 4 extremities. Grossly normal sensation. Normal finger to nose.      ED Results / Procedures / Treatments   Labs (all labs ordered are listed, but only abnormal results are displayed) Labs Reviewed  BASIC METABOLIC PANEL - Abnormal; Notable for the following components:      Result Value   Glucose, Bld 126 (*)    All other components within normal limits  RESP PANEL BY RT-PCR (RSV, FLU A&B, COVID)  RVPGX2  CBC    EKG EKG Interpretation Date/Time:  Monday March 20 2023 15:25:02 EST Ventricular Rate:  83 PR Interval:  176 QRS Duration:  100 QT Interval:  380 QTC Calculation: 446 R Axis:   -43  Text Interpretation: Normal sinus rhythm Left axis deviation RSR' or QR pattern in V1 suggests right ventricular conduction delay Minimal voltage criteria for LVH, may be normal variant ( R in aVL ) Possible Anterior infarct , age undetermined Abnormal ECG  overall similar to 2017 Confirmed by Pricilla Loveless [phone]) on 03/20/2023 6:38:37 PM  Radiology No results found.  Procedures Procedures    Medications Ordered in ED Medications - No data to display  ED Course/ Medical Decision Making/ A&P                                 Medical Decision Making Amount and/or Complexity of Data Reviewed External Data Reviewed: notes. Labs: ordered.    Details: Normal hemoglobin, unremarkable kidney function ECG/medicine tests: ordered and independent interpretation performed.    Details: No ischemia   Unclear why patient has been fatigued.  This has been a months long process.  He was hypertensive when originally here but now his blood pressure is normal in the 120s (on my exam).  No chest pain or shortness of breath.  No clear cause but I do not know that there is much more emergency medicine workup to do for what seems like a nonemergent condition.  I think he can follow-up with his PCP.  Given return precautions.        Final Clinical  Impression(s) / ED Diagnoses Final diagnoses:  Fatigue, unspecified type    Rx / DC Orders ED Discharge Orders     None         Pricilla Loveless, MD 03/20/23 [phone]

## 2023-03-20 NOTE — Discharge Instructions (Signed)
It is unclear why he has been so tired.  Discussed with your doctor for further evaluation including looking at your meds, potential other tests, etc.  If you develop new or worsening weakness or weakness on one side of your body, trouble speaking, headache, chest pain, or any other new/concerning symptoms then return to the ER or call 911

## 2023-03-20 NOTE — ED Triage Notes (Addendum)
Reports fatigue and nausea for a year. Comes and goes. Has seen PCP for same and no conclusive findings. Denies CP, V, D. Endorses SOB. Now on GERD regimen. Has morphine pump for back pan. Hx surgery on neck and lumbar. Weekly testosterone shots.  CC today fatigue, N, and pins and needles in both hands x several days.

## 2023-03-22 ENCOUNTER — Encounter: Payer: Self-pay | Admitting: "Endocrinology

## 2023-03-24 ENCOUNTER — Telehealth: Payer: Medicare Other

## 2023-03-24 ENCOUNTER — Telehealth: Payer: Medicare Other | Admitting: Family

## 2023-03-24 DIAGNOSIS — I4510 Unspecified right bundle-branch block: Secondary | ICD-10-CM | POA: Diagnosis not present

## 2023-03-24 DIAGNOSIS — M47814 Spondylosis without myelopathy or radiculopathy, thoracic region: Secondary | ICD-10-CM | POA: Diagnosis not present

## 2023-03-24 DIAGNOSIS — Z96652 Presence of left artificial knee joint: Secondary | ICD-10-CM | POA: Diagnosis not present

## 2023-03-24 DIAGNOSIS — G894 Chronic pain syndrome: Secondary | ICD-10-CM | POA: Diagnosis not present

## 2023-03-24 DIAGNOSIS — Z978 Presence of other specified devices: Secondary | ICD-10-CM | POA: Diagnosis not present

## 2023-03-24 DIAGNOSIS — M5136 Other intervertebral disc degeneration, lumbar region with discogenic back pain only: Secondary | ICD-10-CM | POA: Diagnosis not present

## 2023-03-24 DIAGNOSIS — Z79899 Other long term (current) drug therapy: Secondary | ICD-10-CM | POA: Diagnosis not present

## 2023-03-24 DIAGNOSIS — Z885 Allergy status to narcotic agent status: Secondary | ICD-10-CM | POA: Diagnosis not present

## 2023-03-24 DIAGNOSIS — R531 Weakness: Secondary | ICD-10-CM | POA: Diagnosis not present

## 2023-03-24 DIAGNOSIS — G4733 Obstructive sleep apnea (adult) (pediatric): Secondary | ICD-10-CM | POA: Diagnosis not present

## 2023-03-24 DIAGNOSIS — M5417 Radiculopathy, lumbosacral region: Secondary | ICD-10-CM | POA: Diagnosis not present

## 2023-03-24 DIAGNOSIS — R110 Nausea: Secondary | ICD-10-CM | POA: Diagnosis not present

## 2023-03-24 DIAGNOSIS — G8929 Other chronic pain: Secondary | ICD-10-CM | POA: Diagnosis not present

## 2023-03-24 DIAGNOSIS — M5186 Other intervertebral disc disorders, lumbar region: Secondary | ICD-10-CM | POA: Diagnosis not present

## 2023-03-24 DIAGNOSIS — I10 Essential (primary) hypertension: Secondary | ICD-10-CM | POA: Diagnosis not present

## 2023-03-24 DIAGNOSIS — Z9889 Other specified postprocedural states: Secondary | ICD-10-CM | POA: Diagnosis not present

## 2023-03-24 DIAGNOSIS — Z79891 Long term (current) use of opiate analgesic: Secondary | ICD-10-CM | POA: Diagnosis not present

## 2023-03-24 DIAGNOSIS — M47816 Spondylosis without myelopathy or radiculopathy, lumbar region: Secondary | ICD-10-CM | POA: Diagnosis not present

## 2023-03-24 DIAGNOSIS — M48061 Spinal stenosis, lumbar region without neurogenic claudication: Secondary | ICD-10-CM | POA: Diagnosis not present

## 2023-03-24 DIAGNOSIS — Z9989 Dependence on other enabling machines and devices: Secondary | ICD-10-CM | POA: Diagnosis not present

## 2023-03-24 DIAGNOSIS — M5030 Other cervical disc degeneration, unspecified cervical region: Secondary | ICD-10-CM | POA: Diagnosis not present

## 2023-03-24 DIAGNOSIS — K219 Gastro-esophageal reflux disease without esophagitis: Secondary | ICD-10-CM | POA: Diagnosis not present

## 2023-03-24 DIAGNOSIS — R9431 Abnormal electrocardiogram [ECG] [EKG]: Secondary | ICD-10-CM | POA: Diagnosis not present

## 2023-03-25 DIAGNOSIS — G8929 Other chronic pain: Secondary | ICD-10-CM | POA: Diagnosis not present

## 2023-03-25 DIAGNOSIS — M791 Myalgia, unspecified site: Secondary | ICD-10-CM | POA: Diagnosis not present

## 2023-03-25 DIAGNOSIS — G479 Sleep disorder, unspecified: Secondary | ICD-10-CM | POA: Diagnosis not present

## 2023-03-25 DIAGNOSIS — G441 Vascular headache, not elsewhere classified: Secondary | ICD-10-CM | POA: Diagnosis not present

## 2023-03-25 DIAGNOSIS — I10 Essential (primary) hypertension: Secondary | ICD-10-CM | POA: Diagnosis not present

## 2023-03-25 DIAGNOSIS — G4709 Other insomnia: Secondary | ICD-10-CM | POA: Diagnosis not present

## 2023-03-25 DIAGNOSIS — G2581 Restless legs syndrome: Secondary | ICD-10-CM | POA: Diagnosis not present

## 2023-03-25 DIAGNOSIS — M545 Low back pain, unspecified: Secondary | ICD-10-CM | POA: Diagnosis not present

## 2023-03-27 ENCOUNTER — Encounter: Payer: Self-pay | Admitting: Family Medicine

## 2023-03-27 ENCOUNTER — Ambulatory Visit (INDEPENDENT_AMBULATORY_CARE_PROVIDER_SITE_OTHER): Payer: Medicare Other | Admitting: Family Medicine

## 2023-03-27 VITALS — BP 147/86 | HR 89 | Ht 73.0 in | Wt 258.0 lb

## 2023-03-27 DIAGNOSIS — G4489 Other headache syndrome: Secondary | ICD-10-CM

## 2023-03-27 DIAGNOSIS — R5383 Other fatigue: Secondary | ICD-10-CM | POA: Diagnosis not present

## 2023-03-27 NOTE — Progress Notes (Signed)
 BP (!) 147/86   Pulse 89   Ht 6' 1 (1.854 m)   Wt 258 lb (117 kg)   SpO2 96%   BMI 34.04 kg/m    Subjective:   Patient ID: Jeff Stewart, male    DOB: 1956/02/07, 67 y.o.   MRN: [id number]  HPI: Jeff Stewart is a 68 y.o. male presenting on 03/27/2023 for Fatigue (Started one month ago and not improving. )   HPI Patient is coming in today after multiple ER visits over the past month.  He was seen 1 month ago for fatigue and then has been seen in the ER over the past few weeks for fatigue and headaches and nausea but is just been worsening.  His fatigue is so bad and he does not have energy to get up and move.  He has been more feeling down and depressed.  He is also had some headaches that are bilateral in the back of his head and coming over the top.  These have been mild to moderate in severity but increasing in frequency.  He is also been having some associated nausea and just not eating as well and has been given some Zofran  which does help with that.  He was also given gabapentin  in the emergency department which made him too irritable and took it for a day or 2 and then stop taking it.  He has been having jerking legs at sleeping time 1 night but that has not been a consistent issue.  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  They removed it so that he could get MRIs.  He says he is keeping his fluids up but his appetite is definitely down.  He has not had any other changes in medicine or any other changes and anything else that he knows of over the past month.  He denies any urinary burning or pain or blood in his urine.  He denies any blood in his stools or diarrhea or constipation.  He denies any cough or congestion.  He has had some flushing and sweating but denies any fevers.  He has his chronic neck pain and back pain and feels like his neck's been a little bit worse but not  significantly.  He does admit that a couple weeks ago when he started feeling bad he stopped using his CPAP machine.  Relevant past medical, surgical, family and social history reviewed and updated as indicated. Interim medical history since our last visit reviewed. Allergies and medications reviewed and updated.  Review of Systems  Constitutional:  Positive for fatigue. Negative for chills and fever.  HENT:  Negative for congestion, ear discharge, ear pain and sore throat.   Eyes:  Negative for visual disturbance.  Respiratory:  Negative for shortness of breath and wheezing.   Cardiovascular:  Negative for chest pain and leg swelling.  Gastrointestinal:  Positive for nausea. Negative for abdominal pain, constipation, diarrhea and vomiting.  Endocrine: Positive for heat intolerance. Negative for cold intolerance.  Musculoskeletal:  Positive for arthralgias, back pain and myalgias. Negative for gait problem.  Skin:  Negative for color change and rash.  Neurological:  Positive for weakness and headaches. Negative for dizziness and light-headedness.  Psychiatric/Behavioral:  Positive for dysphoric mood and sleep disturbance. Negative for decreased concentration, self-injury and suicidal ideas.   All other systems reviewed and are negative.   Per HPI  unless specifically indicated above   Allergies as of 03/27/2023       Reactions   Nalbuphine Nausea And Vomiting, Rash, Shortness Of Breath   Nubain [nalbuphine Hcl] Rash        Medication List        Accurate as of March 27, 2023  1:25 PM. If you have any questions, ask your nurse or doctor.          acetaminophen  500 MG tablet Commonly known as: TYLENOL  Take 500 mg by mouth every 6 (six) hours as needed.   benzonatate  100 MG capsule Commonly known as: Tessalon  Perles Take 1 capsule (100 mg total) by mouth 3 (three) times daily as needed for cough.   cetirizine  10 MG tablet Commonly known as: ZYRTEC  Take 1 tablet (10 mg  total) by mouth daily.   fluticasone  50 MCG/ACT nasal spray Commonly known as: FLONASE  Place 2 sprays into both nostrils daily.   furosemide  40 MG tablet Commonly known as: LASIX  Take 1 tablet (40 mg total) by mouth every morning.   hydrochlorothiazide  25 MG tablet Commonly known as: HYDRODIURIL  Take 1 tablet (25 mg total) by mouth daily. TAKE 1 TABLET DAILY.   losartan  50 MG tablet Commonly known as: COZAAR  Take 1 tablet (50 mg total) by mouth 2 (two) times daily.   pantoprazole  40 MG tablet Commonly known as: PROTONIX  Take 1 tablet (40 mg total) by mouth daily.   sertraline  100 MG tablet Commonly known as: ZOLOFT  Take 1.5 tablets (150 mg total) by mouth daily.   SYRINGE 3CC/21GX1-1/4 21G X 1-1/4 3 ML Misc 1 each by Does not apply route once a week.   testosterone  cypionate 200 MG/ML injection Commonly known as: DEPOTESTOSTERONE CYPIONATE INJECT 0.5 MLS INTRAMUSCULARY EVERY 7 DAYS   Turmeric 500 MG Caps Take by mouth.   Vitamin D  50 MCG (2000 UT) Caps Take 2,000 Units by mouth daily with lunch.         Objective:   BP (!) 147/86   Pulse 89   Ht 6' 1 (1.854 m)   Wt 258 lb (117 kg)   SpO2 96%   BMI 34.04 kg/m   Wt Readings from Last 3 Encounters:  03/27/23 258 lb (117 kg)  02/13/23 260 lb (117.9 kg)  01/26/23 262 lb 12.8 oz (119.2 kg)    Physical Exam Vitals and nursing note reviewed.  Constitutional:      General: He is not in acute distress.    Appearance: He is well-developed. He is not diaphoretic.  HENT:     Mouth/Throat:     Mouth: Mucous membranes are moist.     Pharynx: Oropharynx is clear. No oropharyngeal exudate or posterior oropharyngeal erythema.  Eyes:     General: No scleral icterus.    Extraocular Movements: Extraocular movements intact.     Conjunctiva/sclera: Conjunctivae normal.     Pupils: Pupils are equal, round, and reactive to light.  Neck:     Thyroid : No thyromegaly.  Cardiovascular:     Rate and Rhythm: Normal rate  and regular rhythm.     Heart sounds: Normal heart sounds. No murmur heard. Pulmonary:     Effort: Pulmonary effort is normal. No respiratory distress.     Breath sounds: Normal breath sounds. No wheezing.  Abdominal:     General: Abdomen is flat. Bowel sounds are normal. There is no distension.     Tenderness: There is no abdominal tenderness. There is no guarding or rebound.  Musculoskeletal:  General: No swelling. Normal range of motion.     Cervical back: Neck supple. Tenderness and crepitus present. No swelling, deformity, rigidity, torticollis or bony tenderness. Pain with movement present. Normal range of motion.  Lymphadenopathy:     Cervical: No cervical adenopathy.  Skin:    General: Skin is warm and dry.     Findings: No rash.  Neurological:     General: No focal deficit present.     Mental Status: He is alert and oriented to person, place, and time.     Cranial Nerves: No cranial nerve deficit.     Sensory: No sensory deficit.     Motor: No weakness.     Coordination: Coordination normal.     Gait: Gait normal.  Psychiatric:        Behavior: Behavior normal.       Assessment & Plan:   Problem List Items Addressed This Visit       Other   Fatigue - Primary   Relevant Orders   MR Brain Wo Contrast   CBC with Differential/Platelet   CMP14+EGFR   TSH   Other Visit Diagnoses       Headache syndrome       Relevant Orders   MR Brain Wo Contrast   CBC with Differential/Platelet   CMP14+EGFR       Ordered an MRI of his brain to be done because of his increased headaches.  Also will check his infection markers and inflammatory markers today again.  Recommended he go back to his neurosurgeon, may consider referral to neurology in the future. Follow up plan: Return if symptoms worsen or fail to improve.  Counseling provided for all of the vaccine components Orders Placed This Encounter  Procedures   MR Brain Wo Contrast   CBC with  Differential/Platelet   CMP14+EGFR   TSH    Fonda Levins, MD [facility] 03/27/2023, 1:25 PM

## 2023-03-28 ENCOUNTER — Telehealth: Payer: Self-pay

## 2023-03-28 LAB — CBC WITH DIFFERENTIAL/PLATELET
Basophils Absolute: 0.1 10*3/uL (ref 0.0–0.2)
Basos: 1 %
EOS (ABSOLUTE): 0.2 10*3/uL (ref 0.0–0.4)
Eos: 2 %
Hematocrit: 48.5 % (ref 37.5–51.0)
Hemoglobin: 16 g/dL (ref 13.0–17.7)
Immature Grans (Abs): 0 10*3/uL (ref 0.0–0.1)
Immature Granulocytes: 0 %
Lymphocytes Absolute: 2 10*3/uL (ref 0.7–3.1)
Lymphs: 21 %
MCH: 28.5 pg (ref 26.6–33.0)
MCHC: 33 g/dL (ref 31.5–35.7)
MCV: 87 fL (ref 79–97)
Monocytes Absolute: 0.9 10*3/uL (ref 0.1–0.9)
Monocytes: 9 %
Neutrophils Absolute: 6.4 10*3/uL (ref 1.4–7.0)
Neutrophils: 67 %
Platelets: 217 10*3/uL (ref 150–450)
RBC: 5.61 x10E6/uL (ref 4.14–5.80)
RDW: 13.6 % (ref 11.6–15.4)
WBC: 9.6 10*3/uL (ref 3.4–10.8)

## 2023-03-28 LAB — CMP14+EGFR
ALT: 17 IU/L (ref 0–44)
AST: 16 IU/L (ref 0–40)
Albumin: 4.7 g/dL (ref 3.9–4.9)
Alkaline Phosphatase: 100 IU/L (ref 44–121)
BUN/Creatinine Ratio: 14 (ref 10–24)
BUN: 16 mg/dL (ref 8–27)
Bilirubin Total: 0.4 mg/dL (ref 0.0–1.2)
CO2: 26 mmol/L (ref 20–29)
Calcium: 9.6 mg/dL (ref 8.6–10.2)
Chloride: 97 mmol/L (ref 96–106)
Creatinine, Ser: 1.13 mg/dL (ref 0.76–1.27)
Globulin, Total: 2.5 g/dL (ref 1.5–4.5)
Glucose: 79 mg/dL (ref 70–99)
Potassium: 3.5 mmol/L (ref 3.5–5.2)
Sodium: 141 mmol/L (ref 134–144)
Total Protein: 7.2 g/dL (ref 6.0–8.5)
eGFR: 70 mL/min/{1.73_m2} (ref >59)

## 2023-03-28 LAB — TSH: TSH: 1.41 u[IU]/mL (ref 0.450–4.500)

## 2023-03-28 NOTE — Telephone Encounter (Signed)
 Called patient to reschedule his initial BIPAP follow up , due to keep not having enough days recorded. Patient states he didn't like his experience with his DME [employer]. The lady act like she didn't want to help and just place all the mask on the table then said pick one. Patient states [employer] staff didn't help with the fixing of the mask. Patient states he not impress with the company. Patient was r/s for 05/08/23. Pt verbalized understanding. Pt had no questions at this time but was encouraged to call back if questions arise.

## 2023-03-29 ENCOUNTER — Ambulatory Visit: Payer: Medicare Other | Admitting: Neurology

## 2023-03-29 DIAGNOSIS — M545 Low back pain, unspecified: Secondary | ICD-10-CM | POA: Diagnosis not present

## 2023-03-29 DIAGNOSIS — M5416 Radiculopathy, lumbar region: Secondary | ICD-10-CM | POA: Diagnosis not present

## 2023-03-29 DIAGNOSIS — M47896 Other spondylosis, lumbar region: Secondary | ICD-10-CM | POA: Diagnosis not present

## 2023-03-29 NOTE — Telephone Encounter (Signed)
 I was able to contact an Supervisor at [employer] that will contact the staff manager to informed them of Jeff Stewart complaints.

## 2023-04-04 ENCOUNTER — Ambulatory Visit (HOSPITAL_COMMUNITY)
Admission: RE | Admit: 2023-04-04 | Discharge: 2023-04-04 | Disposition: A | Discharge status: Home / Self Care | Payer: Medicare Other | Source: Ambulatory Visit | Attending: Family Medicine | Admitting: Family Medicine

## 2023-04-04 DIAGNOSIS — R9089 Other abnormal findings on diagnostic imaging of central nervous system: Secondary | ICD-10-CM | POA: Diagnosis not present

## 2023-04-04 DIAGNOSIS — G4489 Other headache syndrome: Secondary | ICD-10-CM | POA: Insufficient documentation

## 2023-04-04 DIAGNOSIS — R5383 Other fatigue: Secondary | ICD-10-CM | POA: Diagnosis not present

## 2023-04-04 DIAGNOSIS — I6782 Cerebral ischemia: Secondary | ICD-10-CM | POA: Diagnosis not present

## 2023-04-19 ENCOUNTER — Ambulatory Visit (INDEPENDENT_AMBULATORY_CARE_PROVIDER_SITE_OTHER): Payer: Medicare Other | Admitting: Family Medicine

## 2023-04-19 ENCOUNTER — Encounter: Payer: Self-pay | Admitting: Family Medicine

## 2023-04-19 VITALS — BP 128/72 | HR 73 | Ht 73.0 in | Wt 264.0 lb

## 2023-04-19 DIAGNOSIS — R0981 Nasal congestion: Secondary | ICD-10-CM | POA: Diagnosis not present

## 2023-04-19 DIAGNOSIS — I10 Essential (primary) hypertension: Secondary | ICD-10-CM | POA: Diagnosis not present

## 2023-04-19 DIAGNOSIS — Z125 Encounter for screening for malignant neoplasm of prostate: Secondary | ICD-10-CM

## 2023-04-19 DIAGNOSIS — Z0001 Encounter for general adult medical examination with abnormal findings: Secondary | ICD-10-CM | POA: Diagnosis not present

## 2023-04-19 DIAGNOSIS — G4733 Obstructive sleep apnea (adult) (pediatric): Secondary | ICD-10-CM | POA: Diagnosis not present

## 2023-04-19 DIAGNOSIS — Z0000 Encounter for general adult medical examination without abnormal findings: Secondary | ICD-10-CM

## 2023-04-19 DIAGNOSIS — F419 Anxiety disorder, unspecified: Secondary | ICD-10-CM | POA: Diagnosis not present

## 2023-04-19 DIAGNOSIS — F32A Depression, unspecified: Secondary | ICD-10-CM

## 2023-04-19 MED ORDER — CETIRIZINE HCL 10 MG PO TABS: 10.0000 mg | ORAL_TABLET | Freq: Every day | ORAL | 3 refills | Status: DC

## 2023-04-19 NOTE — Progress Notes (Signed)
BP 128/72   Pulse 73   Ht 6\' 1"  (1.854 m)   Wt 264 lb (119.7 kg)   SpO2 94%   BMI 34.83 kg/m    Subjective:   Patient ID: Jeff Stewart, male    DOB: 08-06-1955, 68 y.o.   MRN: [id number]  HPI: Jeff Stewart is a 68 y.o. male presenting on 04/19/2023 for Medical Management of Chronic Issues (CPE) and Fatigue (Comes and goes. Cannot function on days when he has fatigue.)   HPI Physical exam Patient denies any chest pain, shortness of breath, headaches or vision issues, abdominal complaints, diarrhea, nausea, vomiting, or joint issues.  Patient's biggest complaint today is fatigue.  Hypertension Patient is currently on losartan and hydrochlorothiazide, and their blood pressure today is 128/72. Patient denies any lightheadedness or dizziness. Patient denies headaches, blurred vision, chest pains, shortness of breath, or weakness. Denies any side effects from medication and is content with current medication.   Sleep apnea Patient uses his sleep apnea machine 100 percent of the night Patient uses a sleep apnea machine 30 days of the month Patient feels like his CPAP makes a significant difference in his sleep and energy. Patient is on CPAP settings AutoPap.  He is working with neurology with this and does feel like it is starting to help and he has less bad days that he was having before with the fatigue they are working on adjusting the mask since to get the right fit for him.  Anxiety depression recheck Patient is coming in today for anxiety and depression recheck.  He is currently taking sertraline.  He feels like he is doing okay, some days he still has the extreme fatigue which makes him feel little depressed but those days are less often now since he is getting back on his sleep apnea machines.    04/19/2023   10:47 AM 03/27/2023    1:01 PM 02/13/2023   10:26 AM 08/31/2022    1:16 PM 08/04/2022    8:22 AM  Depression screen PHQ 2/9  Decreased Interest 1 3 2 1  0  Down,  Depressed, Hopeless 0 3 1 2  0  PHQ - 2 Score 1 6 3 3  0  Altered sleeping 3 2 2 3    Tired, decreased energy 1 3 3 3    Change in appetite 2 3 1  0   Feeling bad or failure about yourself  0 0 0 0   Trouble concentrating   1 0   Moving slowly or fidgety/restless 0 2 0 0   Suicidal thoughts 1 0 0 0   PHQ-9 Score 8 16 10 9    Difficult doing work/chores Somewhat difficult Somewhat difficult Somewhat difficult Somewhat difficult      Relevant past medical, surgical, family and social history reviewed and updated as indicated. Interim medical history since our last visit reviewed. Allergies and medications reviewed and updated.  Review of Systems  Constitutional:  Positive for fatigue. Negative for chills and fever.  HENT:  Negative for ear pain and tinnitus.   Eyes:  Negative for pain.  Respiratory:  Negative for cough, shortness of breath and wheezing.   Cardiovascular:  Negative for chest pain, palpitations and leg swelling.  Gastrointestinal:  Negative for abdominal pain, blood in stool, constipation and diarrhea.  Genitourinary:  Negative for dysuria and hematuria.  Musculoskeletal:  Negative for back pain and myalgias.  Skin:  Negative for rash.  Neurological:  Negative for dizziness, tremors, weakness and headaches.  Psychiatric/Behavioral:  Positive  for dysphoric mood.     Per HPI unless specifically indicated above   Allergies as of 04/19/2023       Reactions   Nalbuphine Nausea And Vomiting, Rash, Shortness Of Breath   Nubain [nalbuphine Hcl] Rash        Medication List        Accurate as of April 19, 2023 11:14 AM. If you have any questions, ask your nurse or doctor.          acetaminophen 500 MG tablet Commonly known as: TYLENOL Take 500 mg by mouth every 6 (six) hours as needed.   benzonatate 100 MG capsule Commonly known as: Tessalon Perles Take 1 capsule (100 mg total) by mouth 3 (three) times daily as needed for cough.   cetirizine 10 MG  tablet Commonly known as: ZYRTEC Take 1 tablet (10 mg total) by mouth daily.   fluticasone 50 MCG/ACT nasal spray Commonly known as: FLONASE Place 2 sprays into both nostrils daily.   furosemide 40 MG tablet Commonly known as: LASIX Take 1 tablet (40 mg total) by mouth every morning.   hydrochlorothiazide 25 MG tablet Commonly known as: HYDRODIURIL Take 1 tablet (25 mg total) by mouth daily. TAKE 1 TABLET DAILY.   losartan 50 MG tablet Commonly known as: COZAAR Take 1 tablet (50 mg total) by mouth 2 (two) times daily.   pantoprazole 40 MG tablet Commonly known as: PROTONIX Take 1 tablet (40 mg total) by mouth daily.   sertraline 100 MG tablet Commonly known as: ZOLOFT Take 1.5 tablets (150 mg total) by mouth daily.   SYRINGE 3CC/21GX1-1/4" 21G X 1-1/4" 3 ML Misc 1 each by Does not apply route once a week.   testosterone cypionate 200 MG/ML injection Commonly known as: DEPOTESTOSTERONE CYPIONATE INJECT 0.5 MLS INTRAMUSCULARY EVERY 7 DAYS   Turmeric 500 MG Caps Take by mouth.   Vitamin D 50 MCG (2000 UT) Caps Take 2,000 Units by mouth daily with lunch.         Objective:   BP 128/72   Pulse 73   Ht 6\' 1"  (1.854 m)   Wt 264 lb (119.7 kg)   SpO2 94%   BMI 34.83 kg/m   Wt Readings from Last 3 Encounters:  04/19/23 264 lb (119.7 kg)  03/27/23 258 lb (117 kg)  02/13/23 260 lb (117.9 kg)    Physical Exam Vitals and nursing note reviewed.  Constitutional:      General: He is not in acute distress.    Appearance: He is well-developed. He is not diaphoretic.  Eyes:     General: No scleral icterus.    Conjunctiva/sclera: Conjunctivae normal.  Neck:     Thyroid: No thyromegaly.  Cardiovascular:     Rate and Rhythm: Normal rate and regular rhythm.     Heart sounds: Normal heart sounds. No murmur heard. Pulmonary:     Effort: Pulmonary effort is normal. No respiratory distress.     Breath sounds: Normal breath sounds. No wheezing.  Abdominal:     General:  Abdomen is flat. Bowel sounds are normal. There is no distension.     Palpations: Abdomen is soft.     Tenderness: There is no abdominal tenderness. There is no guarding.     Hernia: No hernia is present.  Musculoskeletal:        General: No swelling. Normal range of motion.     Cervical back: Neck supple.  Lymphadenopathy:     Cervical: No cervical adenopathy.  Skin:  General: Skin is warm and dry.     Findings: No rash.  Neurological:     Mental Status: He is alert and oriented to person, place, and time.     Coordination: Coordination normal.  Psychiatric:        Behavior: Behavior normal.       Assessment & Plan:   Problem List Items Addressed This Visit       Cardiovascular and Mediastinum   Essential hypertension, benign   Relevant Orders   Lipid panel     Respiratory   OSA (obstructive sleep apnea)     Other   Anxiety and depression   Other Visit Diagnoses       Physical exam    -  Primary   Relevant Orders   Lipid panel   PSA, total and free     Sinus congestion       Relevant Medications   cetirizine (ZYRTEC) 10 MG tablet     Prostate cancer screening       Relevant Orders   PSA, total and free     Continue trying to figure out the sleep settings and I think his fatigue will improve from there.  Otherwise he seems to be doing well.  Blood pressure looks good.  Follow up plan: Return in about 6 months (around 10/17/2023), or if symptoms worsen or fail to improve, for Hypertension recheck.  Counseling provided for all of the vaccine components Orders Placed This Encounter  Procedures   Lipid panel   PSA, total and free    Arville Care, MD [place] [facility] 04/19/2023, 11:14 AM

## 2023-05-01 NOTE — Progress Notes (Signed)
Jeff Stewart

## 2023-05-02 ENCOUNTER — Ambulatory Visit: Payer: Medicare Other | Admitting: Neurology

## 2023-05-02 ENCOUNTER — Encounter: Payer: Self-pay | Admitting: Neurology

## 2023-05-02 VITALS — BP 145/88 | HR 74 | Ht 73.0 in | Wt 250.8 lb

## 2023-05-02 DIAGNOSIS — Z9889 Other specified postprocedural states: Secondary | ICD-10-CM | POA: Diagnosis not present

## 2023-05-02 DIAGNOSIS — M545 Low back pain, unspecified: Secondary | ICD-10-CM

## 2023-05-02 DIAGNOSIS — G8929 Other chronic pain: Secondary | ICD-10-CM

## 2023-05-02 DIAGNOSIS — G4739 Other sleep apnea: Secondary | ICD-10-CM | POA: Diagnosis not present

## 2023-05-02 MED ORDER — CLONAZEPAM 1 MG PO TABS: 1.0000 mg | ORAL_TABLET | Freq: Two times a day (BID) | ORAL | 0 refills | Status: DC | PRN

## 2023-05-02 NOTE — Progress Notes (Signed)
 Provider:  Melvyn Novas, MD  Primary Care Physician:  Dettinger, Elige Radon, MD [address] [place] [address]     Referring Provider: Dettinger, Elige Radon, Md [address] [place],  [place] [address]          Chief Complaint according to patient   Patient presents with:                HISTORY OF PRESENT ILLNESS:  Jeff Stewart is a 68 y.o. male patient who is here for revisit 05/02/2023 for  BiPAP  therapy review. .  Chief concern according to patient :   on a morphine  pump-phonic severe pain   Mr. Keating has a history of pain, failed back surgeries, and he underwent a split-night study here after being diagnosed with severe apnea and a home sleep test in summer 2024.  The split-night titration went on to provide BiPAP at 18 over 14 cm water and a BiPAP was written for 20 over 16 cm water hoping that this would eliminate at least all obstructive apneas.  This indeed seems to have happened but the central apneas remain and have actually increased.  So this patient started office complex sleep apnea and the central component is probably opioid induced, however his AHI has remained very high and all central while on BiPAP 20 over 16 cm water.  I will ask the patient to return for an in-lab BiPAP ST or ASV titration now.  On December 6 th, he had his spinal card stimulator removed and soo after returned for in lab titration.   In addition, he has had nights of not sleeping at all is very exhausted and has actually been seen in the emergency room several times.     Dr Frances Furbish read  the preceding baseline study  in my absence- and ordered the return to full night PAP titration.Jeff Stewart is a 69 year-old patient who had years ago been diagnosed with OSA, was unable to tolerate CPAP and had undergone several ENT surgeries. 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  HST was recently done somewhere ( not  included in his referral ) and he had seen ENT Dr Jenne Pane but failed the endoscopy for [facility] -   He reports excessive daytime sleepiness and is willing to try other modalities than CPAP if this should still not be tolerated well.  The Epworth Sleepiness Scale was 23 /24 (scores above or equal to 10 are suggestive of hypersomnolence).     Review of Systems: Out of a complete 14 system review, the patient complains of only the following symptoms, and all other reviewed systems are negative.:  Fatigue, sleepiness , snoring, fragmented sleep, Insomnia, RLS, Nocturia    How likely are you to doze in the following situations: 0 = not likely, 1 = slight chance, 2 = moderate chance, 3 = high chance   Sitting and Reading? Watching Television? Sitting inactive in a public place (theater or meeting)? As a passenger in a car for an hour without a break? Lying down in the afternoon when circumstances permit? Sitting and talking to someone? Sitting quietly after lunch without alcohol? In a car, while stopped for a few minutes in traffic?   Total = 11 surprised that he will be more sleepy/ 24 points   FSS endorsed at 11 points    Family History  Problem Relation Age of Onset   CAD Father 80   Emphysema Father    Stroke Mother 47   CAD Brother 1       CABG   Alcohol abuse Brother    Hypertension Brother    Stroke Brother    CAD Brother    Cirrhosis Brother    Alcohol abuse Brother    Hypertension Brother    CAD Sister    Hypertension Sister    Hyperlipidemia Sister    Cancer Sister        melanoma   Cancer Sister    Lung cancer Sister    Cancer Brother    Lung cancer Brother    Cancer Brother    Lung cancer Brother    Colon cancer Neg Hx    Colon polyps Neg Hx    Esophageal cancer Neg Hx    Rectal cancer Neg Hx    Stomach cancer Neg Hx     Past Medical History:  Diagnosis Date   Anxiety    Carpal tunnel syndrome, bilateral    Cataract    removed years ago   Chronic back  pain    internal morphine pump   DDD (degenerative disc disease)    neck, lumbar   Depression    Dyslipidemia    diet controlled   GERD (gastroesophageal reflux disease)    past hx- had nissen fundiplication    Hypertension    borderline   Sleep apnea    wears C-PAP   Status post insertion of spinal cord stimulator     Past Surgical History:  Procedure Laterality Date   BACK SURGERY     x 6   CARPAL TUNNEL RELEASE     bilateral   COLONOSCOPY     ELBOW SURGERY     INGUINAL HERNIA REPAIR Right    INTERNAL MORPHINE PUMP      KNEE ARTHROSCOPY     NASAL SEPTUM SURGERY     x 6   neck fusion      NISSEN FUNDOPLICATION     SKIN CANCER EXCISION     SPINAL CORD STIMULATOR INSERTION     STOMACH SURGERY     Nissen Fundiplication   thumb surgery     TOTAL KNEE ARTHROPLASTY Left 04/24/2018   Procedure: TOTAL KNEE ARTHROPLASTY;  Surgeon: Sheral Apley, MD;  Location: [facility];  Service: Orthopedics;  Laterality: Left;   UPPER GASTROINTESTINAL ENDOSCOPY       Current Outpatient Medications on File Prior to Visit  Medication Sig Dispense Refill   acetaminophen (TYLENOL) 500 MG tablet Take 500 mg by mouth every 6 (six) hours as needed.     benzonatate (TESSALON PERLES) 100 MG capsule Take 1 capsule (100 mg total) by mouth 3 (three) times daily as needed for cough. 20 capsule 0   cetirizine (ZYRTEC) 10 MG tablet Take 1 tablet (10 mg total) by mouth daily. 90 tablet 3   Cholecalciferol (VITAMIN D) 50 MCG (2000 UT) CAPS Take 2,000 Units by mouth daily with lunch.     fluticasone (FLONASE) 50 MCG/ACT nasal spray Place 2 sprays into both nostrils daily. 16 g 11   furosemide (LASIX) 40 MG tablet Take 1 tablet (40 mg total) by mouth every morning. 90 tablet 3   hydrochlorothiazide (HYDRODIURIL) 25 MG tablet Take 1 tablet (25 mg total) by mouth daily. TAKE 1 TABLET DAILY. 90 tablet 3   losartan (COZAAR) 50 MG tablet Take 1 tablet (50 mg total) by  mouth 2 (two) times daily. 180 tablet 3    pantoprazole (PROTONIX) 40 MG tablet Take 1 tablet (40 mg total) by mouth daily. 90 tablet 3   sertraline (ZOLOFT) 100 MG tablet Take 1.5 tablets (150 mg total) by mouth daily. 135 tablet 3   Syringe/Needle, Disp, (SYRINGE 3CC/21GX1-1/4") 21G X 1-1/4" 3 ML MISC 1 each by Does not apply route once a week. 100 each 0   testosterone cypionate (DEPOTESTOSTERONE CYPIONATE) 200 MG/ML injection INJECT 0.5 MLS INTRAMUSCULARY EVERY 7 DAYS 10 mL 0   Turmeric 500 MG CAPS Take by mouth.     No current facility-administered medications on file prior to visit.    Allergies  Allergen Reactions   Nalbuphine Nausea And Vomiting, Rash and Shortness Of Breath   Nubain [Nalbuphine Hcl] Rash     DIAGNOSTIC DATA (LABS, IMAGING, TESTING) - I reviewed patient records, labs, notes, testing and imaging myself where available.  Lab Results  Component Value Date   WBC 9.6 03/27/2023   HGB 16.0 03/27/2023   HCT 48.5 03/27/2023   MCV 87 03/27/2023   PLT 217 03/27/2023      Component Value Date/Time   NA 141 03/27/2023 1335   K 3.5 03/27/2023 1335   CL 97 03/27/2023 1335   CO2 26 03/27/2023 1335   GLUCOSE 79 03/27/2023 1335   GLUCOSE 126 (H) 03/20/2023 1518   BUN 16 03/27/2023 1335   CREATININE 1.13 03/27/2023 1335   CALCIUM 9.6 03/27/2023 1335   PROT 7.2 03/27/2023 1335   ALBUMIN 4.7 03/27/2023 1335   AST 16 03/27/2023 1335   ALT 17 03/27/2023 1335   ALKPHOS 100 03/27/2023 1335   BILITOT 0.4 03/27/2023 1335   GFRNONAA >60 03/20/2023 1518   GFRAA 95 10/30/2019 0836   Lab Results  Component Value Date   CHOL 166 01/18/2023   HDL 45 01/18/2023   LDLCALC 100 (H) 01/18/2023   TRIG 117 01/18/2023   CHOLHDL 3.7 01/18/2023   No results found for: "HGBA1C" Lab Results  Component Value Date   VITAMINB12 275 09/10/2021   Lab Results  Component Value Date   TSH 1.410 03/27/2023    PHYSICAL EXAM:  Today's Vitals   05/02/23 1414  BP: (!) 145/88  Pulse: 74  Weight: 250 lb 12.8 oz (113.8 kg)   Height: 6\' 1"  (1.854 m)   Body mass index is 33.09 kg/m.   Wt Readings from Last 3 Encounters:  05/02/23 250 lb 12.8 oz (113.8 kg)  04/19/23 264 lb (119.7 kg)  03/27/23 258 lb (117 kg)     Ht Readings from Last 3 Encounters:  05/02/23 6\' 1"  (1.854 m)  04/19/23 6\' 1"  (1.854 m)  03/27/23 6\' 1"  (1.854 m)      General: The patient is awake,  The patient is awake, alert and appears not in acute distress. The patient is well groomed. Head: Normocephalic, atraumatic.  Neck is supple. Mallampati  UPPP ,  neck circumference:19.5 inches . Nasal airflow not fully patent.  left is occluded . Retrognathia is not seen.  Dental status:  dentures  Cardiovascular:  Regular rate and cardiac rhythm by pulse,  without distended neck veins. Respiratory: Lungs are clear to auscultation.  Skin:  Without evidence of ankle edema, or rash. Trunk: The patient's posture is erect.   NEUROLOGIC EXAM: The patient is awake and alert, oriented to place and time.   Memory subjective described as intact.  Attention span & concentration ability appears limited - he can't remember dates and  places of medical procedures.  Speech is fluent,  without  dysarthria, dysphonia or aphasia.  Mood and affect are appropriate.   Cranial nerves: no loss of smell or taste reported  Pupils are equal and briskly reactive to light. Funduscopic exam deferred..  Extraocular movements in vertical and horizontal planes were intact and without nystagmus. No Diplopia. Visual fields by finger perimetry are intact. Hearing was intact to soft voice and finger rubbing.    Facial sensation intact to fine touch.  Facial motor strength is symmetric and tongue and uvula move midline.  Neck ROM : rotation, tilt and flexion extension were normal for age and shoulder shrug was symmetrical.    Motor exam:  Symmetric bulk, tone and ROM.   Normal tone without cog wheeling, symmetric grip strength .       ASSESSMENT AND PLAN 68 y.o. year  old male  here for SLEEP APNEA evlauation , status post nasal septoplasty,  UPPP, cervical fusion, back surgery dr Noel Gerold, chronic pain in therapy at [facility] . All specialists through [facility] , Primary Care through [facility] Dr Dettinger.    I have only the patients verbal report as to having had a BIPAP machine and tried several masks in the year 2014. He has complex apnea, central sleep apnea.   He is now presenting after a PSG diagnosed with severe complex sleep apnea and started on auto PAP, this in return caused more central apnea to emerge to an AHI of over 30/h. He returned for an in lab titration and ended up responding somewhat to 18/ 14 cm water, but  now shows central apneas emerging further while OSA is treated.   Retrurn for BiPAP / St/ ASV titration.  Insomnia is worse , too.  Benadryl was tried as OTC sleep aid and left him dazed and groggy. I would recommend to treat the central apnea effectivly fisrt, before using sleep aids on a regular bases.    Left nasion is obstructed, He is not an inspire candidate by his own recollection after seeing ENT Dr Jenne Pane.   I plan to follow up either personally or through our NP within 4-6  months post BIPAP in lab titration. .   I would like to thank Dr Marca Ancona, MD pain specialist and  Dettinger, Elige Radon, Md [address] [place],  [place] [address] for allowing me to meet with and to take care of this pleasant patient.     After spending a total time of  40  minutes face to face and additional time for physical and neurologic examination, review of laboratory studies,  personal review of imaging studies, reports and results of other testing and review of referral information / records as far as provided in visit,   Electronically signed by: Melvyn Novas, MD 05/02/2023 2:57 PM  [facility] Board certified by The [employer] of Sleep Medicine and Diplomate of the [employer] of Sleep  Medicine. Board certified In Neurology through the ABPN, Fellow of the [employer] of Neu[employer].

## 2023-05-02 NOTE — Patient Instructions (Signed)

## 2023-05-05 ENCOUNTER — Encounter (HOSPITAL_BASED_OUTPATIENT_CLINIC_OR_DEPARTMENT_OTHER): Payer: Self-pay

## 2023-05-05 ENCOUNTER — Other Ambulatory Visit: Payer: Self-pay

## 2023-05-05 ENCOUNTER — Emergency Department (HOSPITAL_BASED_OUTPATIENT_CLINIC_OR_DEPARTMENT_OTHER)
Admission: EM | Admit: 2023-05-05 | Discharge: 2023-05-06 | Disposition: A | Discharge status: Home / Self Care | Payer: Medicare Other | Attending: Emergency Medicine | Admitting: Emergency Medicine

## 2023-05-05 DIAGNOSIS — M79604 Pain in right leg: Secondary | ICD-10-CM | POA: Insufficient documentation

## 2023-05-05 DIAGNOSIS — M79605 Pain in left leg: Secondary | ICD-10-CM | POA: Diagnosis not present

## 2023-05-05 DIAGNOSIS — G479 Sleep disorder, unspecified: Secondary | ICD-10-CM | POA: Insufficient documentation

## 2023-05-05 DIAGNOSIS — G2581 Restless legs syndrome: Secondary | ICD-10-CM | POA: Insufficient documentation

## 2023-05-05 DIAGNOSIS — R5383 Other fatigue: Secondary | ICD-10-CM | POA: Diagnosis not present

## 2023-05-05 DIAGNOSIS — Z79899 Other long term (current) drug therapy: Secondary | ICD-10-CM | POA: Diagnosis not present

## 2023-05-05 DIAGNOSIS — E876 Hypokalemia: Secondary | ICD-10-CM | POA: Insufficient documentation

## 2023-05-05 DIAGNOSIS — G26 Extrapyramidal and movement disorders in diseases classified elsewhere: Secondary | ICD-10-CM | POA: Diagnosis not present

## 2023-05-05 DIAGNOSIS — G259 Extrapyramidal and movement disorder, unspecified: Secondary | ICD-10-CM | POA: Diagnosis not present

## 2023-05-05 NOTE — ED Triage Notes (Signed)
Pt c/o "leg jerking, arm jerking" onset approx 4p. Pt advises that he believes klonopin is behind movements, but wife disagrees. Wife  states that pt has had episodes like this before, "treated w gabapentin effectively."

## 2023-05-06 ENCOUNTER — Encounter (HOSPITAL_BASED_OUTPATIENT_CLINIC_OR_DEPARTMENT_OTHER): Payer: Self-pay

## 2023-05-06 ENCOUNTER — Emergency Department (HOSPITAL_BASED_OUTPATIENT_CLINIC_OR_DEPARTMENT_OTHER)
Admission: EM | Admit: 2023-05-06 | Discharge: 2023-05-06 | Disposition: A | Discharge status: Home / Self Care | Payer: Medicare Other | Attending: Emergency Medicine | Admitting: Emergency Medicine

## 2023-05-06 DIAGNOSIS — M79606 Pain in leg, unspecified: Secondary | ICD-10-CM | POA: Insufficient documentation

## 2023-05-06 DIAGNOSIS — Z79899 Other long term (current) drug therapy: Secondary | ICD-10-CM | POA: Insufficient documentation

## 2023-05-06 DIAGNOSIS — G259 Extrapyramidal and movement disorder, unspecified: Secondary | ICD-10-CM | POA: Insufficient documentation

## 2023-05-06 DIAGNOSIS — R5383 Other fatigue: Secondary | ICD-10-CM | POA: Insufficient documentation

## 2023-05-06 DIAGNOSIS — M79604 Pain in right leg: Secondary | ICD-10-CM | POA: Diagnosis not present

## 2023-05-06 DIAGNOSIS — G26 Extrapyramidal and movement disorders in diseases classified elsewhere: Secondary | ICD-10-CM | POA: Diagnosis not present

## 2023-05-06 LAB — CBC WITH DIFFERENTIAL/PLATELET
Abs Immature Granulocytes: 0.03 10*3/uL (ref 0.00–0.07)
Basophils Absolute: 0 10*3/uL (ref 0.0–0.1)
Basophils Relative: 0 %
Eosinophils Absolute: 0.1 10*3/uL (ref 0.0–0.5)
Eosinophils Relative: 1 %
HCT: 44.4 % (ref 39.0–52.0)
Hemoglobin: 14.9 g/dL (ref 13.0–17.0)
Immature Granulocytes: 0 %
Lymphocytes Relative: 17 %
Lymphs Abs: 1.6 10*3/uL (ref 0.7–4.0)
MCH: 28.3 pg (ref 26.0–34.0)
MCHC: 33.6 g/dL (ref 30.0–36.0)
MCV: 84.3 fL (ref 80.0–100.0)
Monocytes Absolute: 0.7 10*3/uL (ref 0.1–1.0)
Monocytes Relative: 8 %
Neutro Abs: 6.7 10*3/uL (ref 1.7–7.7)
Neutrophils Relative %: 74 %
Platelets: 214 10*3/uL (ref 150–400)
RBC: 5.27 MIL/uL (ref 4.22–5.81)
RDW: 13 % (ref 11.5–15.5)
WBC: 9.1 10*3/uL (ref 4.0–10.5)
nRBC: 0 % (ref 0.0–0.2)

## 2023-05-06 LAB — BASIC METABOLIC PANEL
Anion gap: 11 (ref 5–15)
BUN: 16 mg/dL (ref 8–23)
CO2: 26 mmol/L (ref 22–32)
Calcium: 9.3 mg/dL (ref 8.9–10.3)
Chloride: 101 mmol/L (ref 98–111)
Creatinine, Ser: 1.03 mg/dL (ref 0.61–1.24)
GFR, Estimated: >60 mL/min (ref >60)
Glucose, Bld: 100 mg/dL — ABNORMAL HIGH (ref 70–99)
Potassium: 3.2 mmol/L — ABNORMAL LOW (ref 3.5–5.1)
Sodium: 138 mmol/L (ref 135–145)

## 2023-05-06 LAB — MAGNESIUM: Magnesium: 2 mg/dL (ref 1.7–2.4)

## 2023-05-06 MED ORDER — PREGABALIN 25 MG PO CAPS
25.0000 mg | ORAL_CAPSULE | Freq: Once | ORAL | Status: AC
  Administered 2023-05-06: 25 mg via ORAL
  Filled 2023-05-06: qty 1

## 2023-05-06 MED ORDER — POTASSIUM CHLORIDE CRYS ER 20 MEQ PO TBCR
40.0000 meq | EXTENDED_RELEASE_TABLET | Freq: Once | ORAL | Status: AC
  Administered 2023-05-06: 40 meq via ORAL
  Filled 2023-05-06: qty 2

## 2023-05-06 MED ORDER — PREGABALIN 25 MG PO CAPS: 25.0000 mg | ORAL_CAPSULE | Freq: Every evening | ORAL | 0 refills | Status: DC | PRN

## 2023-05-06 MED ORDER — HYDROMORPHONE HCL 1 MG/ML IJ SOLN
1.0000 mg | Freq: Once | INTRAMUSCULAR | Status: AC
  Administered 2023-05-06: 1 mg via INTRAVENOUS
  Filled 2023-05-06: qty 1

## 2023-05-06 MED ORDER — POTASSIUM CHLORIDE CRYS ER 20 MEQ PO TBCR: 40.0000 meq | EXTENDED_RELEASE_TABLET | Freq: Two times a day (BID) | ORAL | 0 refills | Status: DC

## 2023-05-06 MED ORDER — DIPHENHYDRAMINE HCL 25 MG PO CAPS
50.0000 mg | ORAL_CAPSULE | Freq: Once | ORAL | Status: AC
  Administered 2023-05-06: 50 mg via ORAL
  Filled 2023-05-06: qty 2

## 2023-05-06 MED ORDER — PREGABALIN 50 MG PO CAPS: 50.0000 mg | ORAL_CAPSULE | Freq: Two times a day (BID) | ORAL | 0 refills | Status: DC

## 2023-05-06 NOTE — ED Notes (Signed)
 Dc instructions reviewed with patient. Patient voiced understanding. Dc with belongings.

## 2023-05-06 NOTE — ED Provider Notes (Signed)
 [place] EMERGENCY DEPARTMENT AT [facility] Provider Note   CSN: [id number] Arrival date & time: 05/05/23  2348     History  Chief Complaint  Patient presents with   Spasms    Jeff Stewart is a 68 y.o. male.  68 year old male who presents ER today secondary to restless legs.  Patient states she has had this for a while.  He started on Klonopin recently for some type of sleeping disorder.  States that he slept very well last night but then woke up this afternoon and had persistent restless legs.  States he tried Neurontin for in the past but it made him more agitated.  No injuries.  When this happened last time a couple weeks ago he had a enormous workup done at [place].  I reviewed he had a relatively negative MRI of his back, head, neck.  Negative infectious/inflammatory workup.        Home Medications Prior to Admission medications   Medication Sig Start Date End Date Taking? Authorizing Provider  potassium chloride SA (KLOR-CON M) 20 MEQ tablet Take 2 tablets (40 mEq total) by mouth 2 (two) times daily for 7 days. 05/06/23 05/13/23 Yes Jakarie Pember, Barbara Cower, MD  pregabalin (LYRICA) 25 MG capsule Take 1-2 capsules (25-50 mg total) by mouth at bedtime as needed (restless legs). 05/06/23  Yes Estil Vallee, Barbara Cower, MD  acetaminophen (TYLENOL) 500 MG tablet Take 500 mg by mouth every 6 (six) hours as needed.    [provider]  benzonatate (TESSALON PERLES) 100 MG capsule Take 1 capsule (100 mg total) by mouth 3 (three) times daily as needed for cough. 05/10/22   Raliegh Ip, DO  cetirizine (ZYRTEC) 10 MG tablet Take 1 tablet (10 mg total) by mouth daily. 04/19/23   Dettinger, Elige Radon, MD  Cholecalciferol (VITAMIN D) 50 MCG (2000 UT) CAPS Take 2,000 Units by mouth daily with lunch.    [provider]  clonazePAM (KLONOPIN) 1 MG tablet Take 1 tablet (1 mg total) by mouth 2 (two) times daily as needed (myoclonus and insomnia). 05/02/23   Dohmeier, Porfirio Mylar, MD   fluticasone (FLONASE) 50 MCG/ACT nasal spray Place 2 sprays into both nostrils daily. 04/06/20   Dettinger, Elige Radon, MD  furosemide (LASIX) 40 MG tablet Take 1 tablet (40 mg total) by mouth every morning. 08/31/22   Dettinger, Elige Radon, MD  hydrochlorothiazide (HYDRODIURIL) 25 MG tablet Take 1 tablet (25 mg total) by mouth daily. TAKE 1 TABLET DAILY. 08/31/22   Dettinger, Elige Radon, MD  losartan (COZAAR) 50 MG tablet Take 1 tablet (50 mg total) by mouth 2 (two) times daily. 07/05/22   Dettinger, Elige Radon, MD  pantoprazole (PROTONIX) 40 MG tablet Take 1 tablet (40 mg total) by mouth daily. 08/31/22   Dettinger, Elige Radon, MD  sertraline (ZOLOFT) 100 MG tablet Take 1.5 tablets (150 mg total) by mouth daily. 08/31/22   Dettinger, Elige Radon, MD  Syringe/Needle, Disp, (SYRINGE 3CC/21GX1-1/4") 21G X 1-1/4" 3 ML MISC 1 each by Does not apply route once a week. 07/10/20   Roma Kayser, MD  testosterone cypionate (DEPOTESTOSTERONE CYPIONATE) 200 MG/ML injection INJECT 0.5 MLS INTRAMUSCULARY EVERY 7 DAYS 01/12/23   Roma Kayser, MD  Turmeric 500 MG CAPS Take by mouth.    [provider]      Allergies    Nalbuphine and Nubain [nalbuphine hcl]    Review of Systems   Review of Systems  Physical Exam Updated Vital Signs BP (!) 145/98  Pulse 80   Temp 98.3 F (36.8 C) (Oral)   Resp 18   Ht 6\' 1"  (1.854 m)   Wt 113.8 kg   SpO2 100%   BMI 33.10 kg/m  Physical Exam Vitals and nursing note reviewed.  Constitutional:      Appearance: He is well-developed.     Comments: restless  HENT:     Head: Normocephalic and atraumatic.  Cardiovascular:     Rate and Rhythm: Normal rate.  Pulmonary:     Effort: Pulmonary effort is normal. No respiratory distress.  Abdominal:     General: There is no distension.  Musculoskeletal:        General: Normal range of motion.     Cervical back: Normal range of motion.  Neurological:     Mental Status: He is alert.     ED Results /  Procedures / Treatments   Labs (all labs ordered are listed, but only abnormal results are displayed) Labs Reviewed  BASIC METABOLIC PANEL - Abnormal; Notable for the following components:      Result Value   Potassium 3.2 (*)    Glucose, Bld 100 (*)    All other components within normal limits  CBC WITH DIFFERENTIAL/PLATELET  MAGNESIUM    EKG None  Radiology No results found.  Procedures Procedures    Medications Ordered in ED Medications  diphenhydrAMINE (BENADRYL) capsule 50 mg (50 mg Oral Given 05/06/23 0031)  potassium chloride SA (KLOR-CON M) CR tablet 40 mEq (40 mEq Oral Given 05/06/23 0118)  HYDROmorphone (DILAUDID) injection 1 mg (1 mg Intravenous Given 05/06/23 0212)  pregabalin (LYRICA) capsule 25 mg (25 mg Oral Given 05/06/23 0246)    ED Course/ Medical Decision Making/ A&P                                 Medical Decision Making Amount and/or Complexity of Data Reviewed Labs: ordered.  Risk Prescription drug management.   Workup reassuring. Will try lyrica as it is first line over requip. Pcp follow up. Electrolytes ok. No indication for repeat imaging. Suspect RLS as etiology.   Final Clinical Impression(s) / ED Diagnoses Final diagnoses:  Pain in both lower extremities    Rx / DC Orders ED Discharge Orders          Ordered    pregabalin (LYRICA) 25 MG capsule  At bedtime PRN        05/06/23 0244    potassium chloride SA (KLOR-CON M) 20 MEQ tablet  2 times daily        05/06/23 0244              Lendell Gallick, Barbara Cower, MD 05/06/23 [phone]

## 2023-05-06 NOTE — ED Triage Notes (Signed)
 Patient arrives with complaints of ongoing leg pains and leg jerking overnight. Patient was seen here last night for the same, with no relief with the medications given.  Patient took klonopin with no relief at home. Rates pain an 8/10.

## 2023-05-06 NOTE — Discharge Instructions (Addendum)
 You were evaluated in the emergency room for restless legs.  I reviewed your outpatient workup including labs done earlier this morning.  I do not feel that any additional imaging or labs are indicated at this time.  A prescription for Lyrica has been sent into your pharmacy.  Please take this as instructed.  You have been provided a referral to see neurology.  Please call and make an appointment within the next week.

## 2023-05-06 NOTE — ED Provider Notes (Cosign Needed Addendum)
 [facility] EMERGENCY DEPARTMENT AT [facility] Provider Note   CSN: [id number] Arrival date & time: 05/06/23  1009     History  Chief Complaint  Patient presents with   Leg Pain   Spasms    Jeff Stewart is a 68 y.o. male who presents with ongoing leg pain and jerking since last night.  Patient was evaluated earlier this morning for similar complaints.  Felt to be consistent with restless leg syndrome.  Labs showed mild hypokalemia otherwise were unremarkable.  He was discharged with prescriptions for potassium supplementation and Lyrica.  He is yet to pick up these prescriptions.  He is here because symptoms have persisted.  Patient states this is a second time this is occurred.  Occurred back in December in the setting of chronic fatigue.  Patient has had substantial workup for this outpatient including brain MRI this past month which did not show any acute findings.  Reports his neurologist prescribed him Klonopin.  He had no significant relief with this.  States gabapentin keeps him awake.   Leg Pain Associated symptoms: fatigue        Home Medications Prior to Admission medications   Medication Sig Start Date End Date Taking? Authorizing Provider  pregabalin (LYRICA) 50 MG capsule Take 1 capsule (50 mg total) by mouth 2 (two) times daily. 05/06/23  Yes Schutt, Edsel Petrin, PA-C  acetaminophen (TYLENOL) 500 MG tablet Take 500 mg by mouth every 6 (six) hours as needed.    [provider]  benzonatate (TESSALON PERLES) 100 MG capsule Take 1 capsule (100 mg total) by mouth 3 (three) times daily as needed for cough. 05/10/22   Raliegh Ip, DO  cetirizine (ZYRTEC) 10 MG tablet Take 1 tablet (10 mg total) by mouth daily. 04/19/23   Dettinger, Elige Radon, MD  Cholecalciferol (VITAMIN D) 50 MCG (2000 UT) CAPS Take 2,000 Units by mouth daily with lunch.    [provider]  clonazePAM (KLONOPIN) 1 MG tablet Take 1 tablet (1 mg total) by mouth 2 (two) times  daily as needed (myoclonus and insomnia). 05/02/23   Dohmeier, Porfirio Mylar, MD  fluticasone (FLONASE) 50 MCG/ACT nasal spray Place 2 sprays into both nostrils daily. 04/06/20   Dettinger, Elige Radon, MD  furosemide (LASIX) 40 MG tablet Take 1 tablet (40 mg total) by mouth every morning. 08/31/22   Dettinger, Elige Radon, MD  hydrochlorothiazide (HYDRODIURIL) 25 MG tablet Take 1 tablet (25 mg total) by mouth daily. TAKE 1 TABLET DAILY. 08/31/22   Dettinger, Elige Radon, MD  losartan (COZAAR) 50 MG tablet Take 1 tablet (50 mg total) by mouth 2 (two) times daily. 07/05/22   Dettinger, Elige Radon, MD  pantoprazole (PROTONIX) 40 MG tablet Take 1 tablet (40 mg total) by mouth daily. 08/31/22   Dettinger, Elige Radon, MD  potassium chloride SA (KLOR-CON M) 20 MEQ tablet Take 2 tablets (40 mEq total) by mouth 2 (two) times daily for 7 days. 05/06/23 05/13/23  Mesner, Barbara Cower, MD  pregabalin (LYRICA) 25 MG capsule Take 1-2 capsules (25-50 mg total) by mouth at bedtime as needed (restless legs). 05/06/23   Mesner, Barbara Cower, MD  sertraline (ZOLOFT) 100 MG tablet Take 1.5 tablets (150 mg total) by mouth daily. 08/31/22   Dettinger, Elige Radon, MD  Syringe/Needle, Disp, (SYRINGE 3CC/21GX1-1/4") 21G X 1-1/4" 3 ML MISC 1 each by Does not apply route once a week. 07/10/20   Roma Kayser, MD  testosterone cypionate (DEPOTESTOSTERONE CYPIONATE) 200 MG/ML injection INJECT 0.5 MLS INTRAMUSCULARY  EVERY 7 DAYS 01/12/23   Roma Kayser, MD  Turmeric 500 MG CAPS Take by mouth.    [provider]      Allergies    Nalbuphine and Nubain [nalbuphine hcl]    Review of Systems   Review of Systems  Constitutional:  Positive for fatigue.    Physical Exam Updated Vital Signs BP 136/81   Pulse 82   Temp 98.2 F (36.8 C) (Oral)   Resp 18   SpO2 99%  Physical Exam Vitals and nursing note reviewed.  Constitutional:      General: He is not in acute distress.    Appearance: He is well-developed.  HENT:     Head: Normocephalic  and atraumatic.  Eyes:     Conjunctiva/sclera: Conjunctivae normal.  Cardiovascular:     Rate and Rhythm: Normal rate and regular rhythm.     Heart sounds: No murmur heard. Pulmonary:     Effort: Pulmonary effort is normal. No respiratory distress.     Breath sounds: Normal breath sounds.  Abdominal:     Palpations: Abdomen is soft.     Tenderness: There is no abdominal tenderness.  Musculoskeletal:        General: No swelling.     Cervical back: Neck supple.     Comments: Patient with intermittent bilateral leg clonus jerking on exam.  There is no tenderness or swelling.  He is able to ambulate without difficulty  Skin:    General: Skin is warm and dry.     Capillary Refill: Capillary refill takes less than 2 seconds.  Neurological:     General: No focal deficit present.     Mental Status: He is alert and oriented to person, place, and time.     Cranial Nerves: No cranial nerve deficit.     Sensory: No sensory deficit.     Motor: No weakness.     Coordination: Coordination normal.     Gait: Gait normal.     Comments: No facial droop, EOMI, PERRL, no nystagmus, no facial droop or abnormal phonation  Psychiatric:        Mood and Affect: Mood normal.     ED Results / Procedures / Treatments   Labs (all labs ordered are listed, but only abnormal results are displayed) Labs Reviewed - No data to display  EKG None  Radiology No results found.  Procedures Procedures    Medications Ordered in ED Medications - No data to display  ED Course/ Medical Decision Making/ A&P                                 Medical Decision Making  This patient presents to the ED with chief complaint(s) of leg jerking.  The complaint involves an extensive differential diagnosis and also carries with it a high risk of complications and morbidity.   pertinent past medical history as listed in HPI  The differential diagnosis includes  RLS, CVA, TIA, autoimmune, anemia, electrolyte  abnormality  Additional history obtained: Additional history obtained from spouse Records reviewed previous admission documents, Care Everywhere/External Records, and Primary Care Documents  Initial Assessment:   Patient presents hypertensive with complaints of restless legs since last night.  Was evaluated earlier this morning with similar complaints.  Lab work showed mild hypokalemia, otherwise unremarkable.  Prescription was sent in for potassium supplementation and Lyrica, however he is yet to pick these up.  He states his  symptoms have persisted since evaluation this morning without any improvement.  Reassuringly he has had substantial outpatient workup including brain MRI this past month which was unremarkable.  Independent ECG interpretation:  none  Independent labs interpretation:  The following labs were independently interpreted:  none  Independent visualization and interpretation of imaging: none  Treatment and Reassessment: No meds administered during visit  Consultations obtained:   Neurology Dr. Selina Cooley recommended dose of Lyrica to 50 twice daily with close neurology follow-up  Disposition:   Patient will be discharged home.  Provided referral for neurology.  Prescription for Lyrica 50 twice daily sent in to pharmacy. The patient has been appropriately medically screened and/or stabilized in the ED. I have low suspicion for any other emergent medical condition which would require further screening, evaluation or treatment in the ED or require inpatient management. At time of discharge the patient is hemodynamically stable and in no acute distress. I have discussed work-up results and diagnosis with patient and answered all questions. Patient is agreeable with discharge plan. We discussed strict return precautions for returning to the emergency department and they verbalized understanding.     Social Determinants of Health:   None  This note was dictated with voice  recognition software.  Despite best efforts at proofreading, errors may have occurred which can change the documentation meaning.          Final Clinical Impression(s) / ED Diagnoses Final diagnoses:  Movement disorder    Rx / DC Orders ED Discharge Orders          Ordered    Ambulatory referral to Neurology       Comments: An appointment is requested in approximately: 1 week Follows with Dr. Vickey Huger   05/06/23 1516    pregabalin (LYRICA) 50 MG capsule  2 times daily        05/06/23 1551              Halford Decamp, PA-C 05/06/23 1601    Halford Decamp, PA-C 05/07/23 1610    Derwood Kaplan, MD 05/08/23 1142

## 2023-05-07 ENCOUNTER — Encounter: Payer: Self-pay | Admitting: Neurology

## 2023-05-08 ENCOUNTER — Ambulatory Visit: Payer: Medicare Other | Admitting: Neurology

## 2023-05-08 ENCOUNTER — Telehealth: Payer: Self-pay | Admitting: Neurology

## 2023-05-08 DIAGNOSIS — M545 Low back pain, unspecified: Secondary | ICD-10-CM

## 2023-05-08 DIAGNOSIS — Z9889 Other specified postprocedural states: Secondary | ICD-10-CM

## 2023-05-08 DIAGNOSIS — G4739 Other sleep apnea: Secondary | ICD-10-CM

## 2023-05-08 NOTE — Telephone Encounter (Signed)
 I spoke with patient and informed him that I had a cancellation for tonight at 9 pm for his SS. He stated he would take it. I also sent him a mychart message with appointment information.   BIPAP [employer] medicare no auth req   Patient is scheduled at [facility] for2/17/25 at 9 pm..

## 2023-05-10 DIAGNOSIS — M961 Postlaminectomy syndrome, not elsewhere classified: Secondary | ICD-10-CM | POA: Diagnosis not present

## 2023-05-10 DIAGNOSIS — Z978 Presence of other specified devices: Secondary | ICD-10-CM | POA: Diagnosis not present

## 2023-05-10 DIAGNOSIS — G894 Chronic pain syndrome: Secondary | ICD-10-CM | POA: Diagnosis not present

## 2023-05-10 DIAGNOSIS — M5030 Other cervical disc degeneration, unspecified cervical region: Secondary | ICD-10-CM | POA: Diagnosis not present

## 2023-05-10 DIAGNOSIS — M5412 Radiculopathy, cervical region: Secondary | ICD-10-CM | POA: Diagnosis not present

## 2023-05-10 DIAGNOSIS — M5416 Radiculopathy, lumbar region: Secondary | ICD-10-CM | POA: Diagnosis not present

## 2023-05-10 DIAGNOSIS — M5000 Cervical disc disorder with myelopathy, unspecified cervical region: Secondary | ICD-10-CM | POA: Diagnosis not present

## 2023-05-10 DIAGNOSIS — M51362 Other intervertebral disc degeneration, lumbar region with discogenic back pain and lower extremity pain: Secondary | ICD-10-CM | POA: Diagnosis not present

## 2023-05-10 DIAGNOSIS — M542 Cervicalgia: Secondary | ICD-10-CM | POA: Diagnosis not present

## 2023-05-10 DIAGNOSIS — M4322 Fusion of spine, cervical region: Secondary | ICD-10-CM | POA: Diagnosis not present

## 2023-05-11 NOTE — Telephone Encounter (Signed)
 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 That's not a sleep disorder. We can do the PSG ( sleep study) and see if there is  periodic movement pattern. If not,  you will need the help to your spine surgeon and phys med specialists.   This MyChart message has not been read.

## 2023-05-12 ENCOUNTER — Other Ambulatory Visit: Payer: Self-pay | Admitting: Orthopaedic Surgery

## 2023-05-12 DIAGNOSIS — M542 Cervicalgia: Secondary | ICD-10-CM

## 2023-05-12 DIAGNOSIS — M5412 Radiculopathy, cervical region: Secondary | ICD-10-CM

## 2023-05-12 DIAGNOSIS — M4322 Fusion of spine, cervical region: Secondary | ICD-10-CM

## 2023-05-15 ENCOUNTER — Ambulatory Visit: Payer: Medicare Other | Admitting: Family Medicine

## 2023-05-15 ENCOUNTER — Ambulatory Visit (INDEPENDENT_AMBULATORY_CARE_PROVIDER_SITE_OTHER): Payer: Medicare Other | Admitting: Neurology

## 2023-05-15 DIAGNOSIS — Z9889 Other specified postprocedural states: Secondary | ICD-10-CM

## 2023-05-15 DIAGNOSIS — G4739 Other sleep apnea: Secondary | ICD-10-CM | POA: Diagnosis not present

## 2023-05-15 DIAGNOSIS — G8929 Other chronic pain: Secondary | ICD-10-CM

## 2023-05-16 ENCOUNTER — Other Ambulatory Visit: Payer: Self-pay | Admitting: "Endocrinology

## 2023-05-16 DIAGNOSIS — E291 Testicular hypofunction: Secondary | ICD-10-CM

## 2023-05-16 NOTE — Telephone Encounter (Signed)
 I will get back to him with the  report. He didn't initiate sleep- left frustrated. His leg movements  were uncontrollable, according to the tech-    There has been a clinical trial for  RLS like symptoms in patients with  myoclonus and peripheral arterial disease, and this trial used Dipyramidol, we know it in neurology from stroke care as Aggrenox. 75 mg bid.   I will ask Dr Dettinger if he sees any contraindication to a trial of this med. I would like to give him  the option.  Melvyn Novas, MD

## 2023-05-17 ENCOUNTER — Encounter: Payer: Self-pay | Admitting: Neurology

## 2023-05-17 ENCOUNTER — Other Ambulatory Visit: Payer: Self-pay | Admitting: Neurology

## 2023-05-17 DIAGNOSIS — M47816 Spondylosis without myelopathy or radiculopathy, lumbar region: Secondary | ICD-10-CM | POA: Diagnosis not present

## 2023-05-17 MED ORDER — ASPIRIN-DIPYRIDAMOLE ER 25-200 MG PO CP12: 1.0000 | ORAL_CAPSULE | Freq: Two times a day (BID) | ORAL | 1 refills | Status: DC

## 2023-05-17 NOTE — Progress Notes (Unsigned)
 Marland Kitchen

## 2023-05-18 ENCOUNTER — Other Ambulatory Visit: Payer: Medicare Other

## 2023-05-18 DIAGNOSIS — K219 Gastro-esophageal reflux disease without esophagitis: Secondary | ICD-10-CM | POA: Diagnosis not present

## 2023-05-18 DIAGNOSIS — G8929 Other chronic pain: Secondary | ICD-10-CM | POA: Diagnosis not present

## 2023-05-18 DIAGNOSIS — I10 Essential (primary) hypertension: Secondary | ICD-10-CM | POA: Diagnosis not present

## 2023-05-22 LAB — CBC WITH DIFFERENTIAL/PLATELET
Basophils Absolute: 0 10*3/uL (ref 0.0–0.2)
Basos: 1 %
EOS (ABSOLUTE): 0.2 10*3/uL (ref 0.0–0.4)
Eos: 3 %
Hematocrit: 47.1 % (ref 37.5–51.0)
Hemoglobin: 15.2 g/dL (ref 13.0–17.7)
Immature Grans (Abs): 0 10*3/uL (ref 0.0–0.1)
Immature Granulocytes: 0 %
Lymphocytes Absolute: 1.5 10*3/uL (ref 0.7–3.1)
Lymphs: 26 %
MCH: 28.5 pg (ref 26.6–33.0)
MCHC: 32.3 g/dL (ref 31.5–35.7)
MCV: 88 fL (ref 79–97)
Monocytes Absolute: 0.5 10*3/uL (ref 0.1–0.9)
Monocytes: 8 %
Neutrophils Absolute: 3.6 10*3/uL (ref 1.4–7.0)
Neutrophils: 62 %
Platelets: 218 10*3/uL (ref 150–450)
RBC: 5.33 x10E6/uL (ref 4.14–5.80)
RDW: 13.4 % (ref 11.6–15.4)
WBC: 5.8 10*3/uL (ref 3.4–10.8)

## 2023-05-22 LAB — TESTOSTERONE, FREE, TOTAL, SHBG
Sex Hormone Binding: 16.8 nmol/L — ABNORMAL LOW (ref 19.3–76.4)
Testosterone, Free: 18.8 pg/mL — ABNORMAL HIGH (ref 6.6–18.1)
Testosterone: 797 ng/dL (ref 264–916)

## 2023-05-22 LAB — PSA: Prostate Specific Ag, Serum: 1.9 ng/mL (ref 0.0–4.0)

## 2023-05-23 NOTE — Addendum Note (Signed)
 Addended by: Melvyn Novas on: 05/23/2023 05:45 PM   Modules accepted: Orders

## 2023-05-25 ENCOUNTER — Ambulatory Visit
Admission: RE | Admit: 2023-05-25 | Discharge: 2023-05-25 | Disposition: A | Discharge status: Home / Self Care | Source: Ambulatory Visit | Attending: Orthopaedic Surgery | Admitting: Orthopaedic Surgery

## 2023-05-25 DIAGNOSIS — M5412 Radiculopathy, cervical region: Secondary | ICD-10-CM

## 2023-05-25 DIAGNOSIS — Z981 Arthrodesis status: Secondary | ICD-10-CM | POA: Diagnosis not present

## 2023-05-25 DIAGNOSIS — M542 Cervicalgia: Secondary | ICD-10-CM

## 2023-05-25 DIAGNOSIS — R200 Anesthesia of skin: Secondary | ICD-10-CM | POA: Diagnosis not present

## 2023-05-25 DIAGNOSIS — M4322 Fusion of spine, cervical region: Secondary | ICD-10-CM

## 2023-05-26 ENCOUNTER — Encounter: Payer: Self-pay | Admitting: "Endocrinology

## 2023-05-26 ENCOUNTER — Ambulatory Visit: Payer: Medicare Other | Admitting: "Endocrinology

## 2023-05-26 VITALS — BP 120/80 | HR 80 | Ht 73.0 in | Wt 255.4 lb

## 2023-05-26 DIAGNOSIS — E559 Vitamin D deficiency, unspecified: Secondary | ICD-10-CM | POA: Diagnosis not present

## 2023-05-26 DIAGNOSIS — E291 Testicular hypofunction: Secondary | ICD-10-CM

## 2023-05-26 MED ORDER — TESTOSTERONE CYPIONATE 200 MG/ML IM SOLN: INTRAMUSCULAR | 0 refills | Status: DC

## 2023-05-26 NOTE — Progress Notes (Signed)
 05/26/2023                   Endocrinology follow-up note  Subjective:    Patient ID: Jeff Stewart, male    DOB: Jul 19, 1955, PCP Stewart, Jeff Radon, MD   Past Medical History:  Diagnosis Date   Anxiety    Carpal tunnel syndrome, bilateral    Cataract    removed years ago   Chronic back pain    internal morphine pump   DDD (degenerative disc disease)    neck, lumbar   Depression    Dyslipidemia    diet controlled   GERD (gastroesophageal reflux disease)    past hx- had nissen fundiplication    Hypertension    borderline   Sleep apnea    wears C-PAP   Status post insertion of spinal cord stimulator    Past Surgical History:  Procedure Laterality Date   BACK SURGERY     x 6   CARPAL TUNNEL RELEASE     bilateral   COLONOSCOPY     ELBOW SURGERY     INGUINAL HERNIA REPAIR Right    INTERNAL MORPHINE PUMP      KNEE ARTHROSCOPY     NASAL SEPTUM SURGERY     x 6   neck fusion      NISSEN FUNDOPLICATION     SKIN CANCER EXCISION     SPINAL CORD STIMULATOR INSERTION     SPINAL CORD STIMULATOR REMOVAL  02/2023   STOMACH SURGERY     Nissen Fundiplication   thumb surgery     TOTAL KNEE ARTHROPLASTY Left 04/24/2018   Procedure: TOTAL KNEE ARTHROPLASTY;  Surgeon: Jeff Apley, MD;  Location: [facility];  Service: Orthopedics;  Laterality: Left;   UPPER GASTROINTESTINAL ENDOSCOPY     Social History   Socioeconomic History   Marital status: Married    Spouse name: Not on file   Number of children: 3   Years of education: Not on file   Highest education level: Not on file  Occupational History   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  Tobacco Use   Smoking status: Never   Smokeless tobacco: Never  Vaping Use   Vaping status: Never Used  Substance and Sexual Activity   Alcohol use: No   Drug use: No   Sexual activity: Not Currently    Birth control/protection: Post-menopausal    Comment: married for 1972  Other Topics Concern   Not on file   Social History Narrative   Lives with wife    Children live nearby   Retired    Social Drivers of Corporate investment banker Strain: Low Risk  (08/04/2022)   Overall Financial Resource Strain (CARDIA)    Difficulty of Paying Living Expenses: Not hard at all  Food Insecurity: No Food Insecurity (08/04/2022)   Hunger Vital Sign    Worried About Running Out of Food in the Last Year: Never true    Ran Out of Food in the Last Year: Never true  Transportation Needs: No Transportation Needs (08/04/2022)   PRAPARE - Administrator, Civil Service (Medical): No    Lack of Transportation (Non-Medical): No  Physical Activity: Insufficiently Active (08/04/2022)   Exercise Vital Sign    Days of Exercise per Week: 3 days    Minutes of Exercise per Session: 30 min  Stress: No Stress Concern Present (08/04/2022)   [employer] of Occupational Health - Occupational Stress Questionnaire  Feeling of Stress : Not at all  Social Connections: Moderately Integrated (08/04/2022)   Social Connection and Isolation Panel [NHANES]    Frequency of Communication with Friends and Family: More than three times a week    Frequency of Social Gatherings with Friends and Family: More than three times a week    Attends Religious Services: More than 4 times per year    Active Member of [employer] or Organizations: No    Attends Banker Meetings: Never    Marital Status: Married   Outpatient Encounter Medications as of 05/26/2023  Medication Sig   acetaminophen (TYLENOL) 500 MG tablet Take 500 mg by mouth every 6 (six) hours as needed.   Cholecalciferol (VITAMIN D) 50 MCG (2000 UT) CAPS Take 2,000 Units by mouth daily with lunch.   dipyridamole-aspirin (AGGRENOX) 200-25 MG 12hr capsule Take 1 capsule by mouth 2 (two) times daily.   fluticasone (FLONASE) 50 MCG/ACT nasal spray Place 2 sprays into both nostrils daily.   furosemide (LASIX) 40 MG tablet Take 1 tablet (40 mg total) by mouth every  morning.   hydrochlorothiazide (HYDRODIURIL) 25 MG tablet Take 1 tablet (25 mg total) by mouth daily. TAKE 1 TABLET DAILY.   losartan (COZAAR) 50 MG tablet Take 1 tablet (50 mg total) by mouth 2 (two) times daily.   pantoprazole (PROTONIX) 40 MG tablet Take 1 tablet (40 mg total) by mouth daily.   potassium chloride SA (KLOR-CON M) 20 MEQ tablet Take 2 tablets (40 mEq total) by mouth 2 (two) times daily for 7 days.   sertraline (ZOLOFT) 100 MG tablet Take 1.5 tablets (150 mg total) by mouth daily.   Syringe/Needle, Disp, (SYRINGE 3CC/21GX1-1/4") 21G X 1-1/4" 3 ML MISC 1 each by Does not apply route once a week.   testosterone cypionate (DEPOTESTOSTERONE CYPIONATE) 200 MG/ML injection INJECT 0.5 MLS EVERY 10 DAYS.   Turmeric 500 MG CAPS Take by mouth.   [DISCONTINUED] clonazePAM (KLONOPIN) 1 MG tablet Take 1 tablet (1 mg total) by mouth 2 (two) times daily as needed (myoclonus and insomnia).   [DISCONTINUED] testosterone cypionate (DEPOTESTOSTERONE CYPIONATE) 200 MG/ML injection INJECT 0.5 MLS ONCE WEEKLY   No facility-administered encounter medications on file as of 05/26/2023.   ALLERGIES: Allergies  Allergen Reactions   Nalbuphine Nausea And Vomiting, Rash and Shortness Of Breath   Nubain [Nalbuphine Hcl] Rash   VACCINATION STATUS: Immunization History  Administered Date(s) Administered   Fluad Quad(high Dose 65+) 12/21/2018, 12/22/2020, 01/07/2022   Fluad Trivalent(High Dose 65+) 02/13/2023   Influenza Split 01/19/2014   Influenza,inj,Quad PF,6+ Mos 01/14/2015, 02/02/2016, 02/08/2018   Influenza-Unspecified 02/19/2014, 02/24/2020   Moderna Sars-Covid-2 Vaccination 05/12/2019, 05/23/2019, 03/10/2020   Pneumococcal Conjugate-13 08/09/2019   Pneumococcal Polysaccharide-23 04/23/2020   Tdap 12/11/2014   Zoster Recombinant(Shingrix) 04/23/2020, 10/08/2020    HPI Jeff Stewart is a 68 year old gentleman with a medical history as above.  He is being seen in follow-up for hypogonadism on  testosterone replacement therapy.   -He remains on testosterone cypionate .  He is currently on testosterone 100 mg IM every 7 days.  His previsit labs show total testosterone acceptable above target at 797.    No other major interval medical history.  He has no new complaints today.  -He reports better energy level, better mobility. He was known to have low testosterone for at least since age 18.   No evidence of adverse effects from testosterone treatment.  He wishes to be continued on testosterone placement therapy. He fathers 3 grown  children. He denies history of head injury , has had nasal septal surgery multiple times. He denies injury to the testicles, exposure to chemotherapy, exposure to radiation to the genitals. However, due to chronic back injury and surgery he has exposure to heavy opioid therapy for pain control. He is currently on morphine pump. He has been off and  on  opioids for pain control for the last 10-15 years.  He struggles with sleep apnea, even changing CPAP machines did not help much. Due to changes in his diet he has recently lost 12 pounds of weight.  Review of Systems Limited as above.  Objective:    BP 120/80   Pulse 80   Ht 6\' 1"  (1.854 m)   Wt 255 lb 6.4 oz (115.8 kg)   BMI 33.70 kg/m   Wt Readings from Last 3 Encounters:  05/26/23 255 lb 6.4 oz (115.8 kg)  05/05/23 250 lb 14.1 oz (113.8 kg)  05/02/23 250 lb 12.8 oz (113.8 kg)      From previous exam.  Genitourinary system: He has bilaterally shrunk testicles, right testes 12 mL, left testes 15 mL. No scrotal mass, no varicocele,  no inguinal lymphadenopathy.    Recent Results (from the past 2160 hours)  CBC     Status: None   Collection Time: 03/20/23  3:18 PM  Result Value Ref Range   WBC 6.9 4.0 - 10.5 K/uL   RBC 5.59 4.22 - 5.81 MIL/uL   Hemoglobin 15.8 13.0 - 17.0 g/dL   HCT 40.9 81.1 - 91.4 %   MCV 86.0 80.0 - 100.0 fL   MCH 28.3 26.0 - 34.0 pg   MCHC 32.8 30.0 - 36.0 g/dL   RDW  78.2 95.6 - 21.3 %   Platelets 206 150 - 400 K/uL   nRBC 0.0 0.0 - 0.2 %    Comment: Performed at [facility], [address] [place], [place] [address]  Basic metabolic panel     Status: Abnormal   Collection Time: 03/20/23  3:18 PM  Result Value Ref Range   Sodium 139 135 - 145 mmol/L   Potassium 3.6 3.5 - 5.1 mmol/L   Chloride 99 98 - 111 mmol/L   CO2 30 22 - 32 mmol/L   Glucose, Bld 126 (H) 70 - 99 mg/dL    Comment: Glucose reference range applies only to samples taken after fasting for at least 8 hours.   BUN 16 8 - 23 mg/dL   Creatinine, Ser 8.46 0.61 - 1.24 mg/dL   Calcium 9.7 8.9 - 96.2 mg/dL   GFR, Estimated >95 >28 mL/min    Comment: (NOTE) Calculated using the CKD-EPI Creatinine Equation (2021)    Anion gap 10 5 - 15    Comment: Performed at [facility], [address], [place], [place] [address]  Resp panel by RT-PCR (RSV, Flu A&B, Covid) Anterior Nasal Swab     Status: None   Collection Time: 03/20/23  3:22 PM   Specimen: Anterior Nasal Swab  Result Value Ref Range   SARS Coronavirus 2 by RT PCR NEGATIVE NEGATIVE    Comment: (NOTE) SARS-CoV-2 target nucleic acids are NOT DETECTED.  The SARS-CoV-2 RNA is generally detectable in upper respiratory specimens during the acute phase of infection. The lowest concentration of SARS-CoV-2 viral copies this assay can detect is 138 copies/mL. A negative result does not preclude SARS-Cov-2 infection and should not be used as the sole basis for treatment or other patient management decisions. A negative  result may occur with  improper specimen collection/handling, submission of specimen other than nasopharyngeal swab, presence of viral mutation(s) within the areas targeted by this assay, and inadequate number of viral copies(<138 copies/mL). A negative result must be combined with clinical observations, patient history, and epidemiological information. The expected result is  Negative.  Fact Sheet for Patients:  BloggerCourse.com  Fact Sheet for Healthcare Providers:  SeriousBroker.it  This test is no t yet approved or cleared by the [place] FDA and  has been authorized for detection and/or diagnosis of SARS-CoV-2 by FDA under an Emergency Use Authorization (EUA). This EUA will remain  in effect (meaning this test can be used) for the duration of the COVID-19 declaration under Section 564(b)(1) of the Act, 21 U.S.C.section 360bbb-3(b)(1), unless the authorization is terminated  or revoked sooner.       Influenza A by PCR NEGATIVE NEGATIVE   Influenza B by PCR NEGATIVE NEGATIVE    Comment: (NOTE) The Xpert Xpress SARS-CoV-2/FLU/RSV plus assay is intended as an aid in the diagnosis of influenza from Nasopharyngeal swab specimens and should not be used as a sole basis for treatment. Nasal washings and aspirates are unacceptable for Xpert Xpress SARS-CoV-2/FLU/RSV testing.  Fact Sheet for Patients: BloggerCourse.com  Fact Sheet for Healthcare Providers: SeriousBroker.it  This test is not yet approved or cleared by the [place] FDA and has been authorized for detection and/or diagnosis of SARS-CoV-2 by FDA under an Emergency Use Authorization (EUA). This EUA will remain in effect (meaning this test can be used) for the duration of the COVID-19 declaration under Section 564(b)(1) of the Act, 21 U.S.C. section 360bbb-3(b)(1), unless the authorization is terminated or revoked.     Resp Syncytial Virus by PCR NEGATIVE NEGATIVE    Comment: (NOTE) Fact Sheet for Patients: BloggerCourse.com  Fact Sheet for Healthcare Providers: SeriousBroker.it  This test is not yet approved or cleared by the [place] FDA and has been authorized for detection and/or diagnosis of SARS-CoV-2 by FDA under an  Emergency Use Authorization (EUA). This EUA will remain in effect (meaning this test can be used) for the duration of the COVID-19 declaration under Section 564(b)(1) of the Act, 21 U.S.C. section 360bbb-3(b)(1), unless the authorization is terminated or revoked.  Performed at [facility], [address], [place], [place] [address]   CBC with Differential/Platelet     Status: None   Collection Time: 03/27/23  1:35 PM  Result Value Ref Range   WBC 9.6 3.4 - 10.8 x10E3/uL   RBC 5.61 4.14 - 5.80 x10E6/uL   Hemoglobin 16.0 13.0 - 17.7 g/dL   Hematocrit 60.4 54.0 - 51.0 %   MCV 87 79 - 97 fL   MCH 28.5 26.6 - 33.0 pg   MCHC 33.0 31.5 - 35.7 g/dL   RDW 98.1 19.1 - 47.8 %   Platelets 217 150 - 450 x10E3/uL   Neutrophils 67 Not Estab. %   Lymphs 21 Not Estab. %   Monocytes 9 Not Estab. %   Eos 2 Not Estab. %   Basos 1 Not Estab. %   Neutrophils Absolute 6.4 1.4 - 7.0 x10E3/uL   Lymphocytes Absolute 2.0 0.7 - 3.1 x10E3/uL   Monocytes Absolute 0.9 0.1 - 0.9 x10E3/uL   EOS (ABSOLUTE) 0.2 0.0 - 0.4 x10E3/uL   Basophils Absolute 0.1 0.0 - 0.2 x10E3/uL   Immature Granulocytes 0 Not Estab. %   Immature Grans (Abs) 0.0 0.0 - 0.1 x10E3/uL  CMP14+EGFR     Status: None  Collection Time: 03/27/23  1:35 PM  Result Value Ref Range   Glucose 79 70 - 99 mg/dL   BUN 16 8 - 27 mg/dL   Creatinine, Ser 2.13 0.76 - 1.27 mg/dL   eGFR 70 >08 MV/HQI/6.96   BUN/Creatinine Ratio 14 10 - 24   Sodium 141 134 - 144 mmol/L   Potassium 3.5 3.5 - 5.2 mmol/L   Chloride 97 96 - 106 mmol/L   CO2 26 20 - 29 mmol/L   Calcium 9.6 8.6 - 10.2 mg/dL   Total Protein 7.2 6.0 - 8.5 g/dL   Albumin 4.7 3.9 - 4.9 g/dL   Globulin, Total 2.5 1.5 - 4.5 g/dL   Bilirubin Total 0.4 0.0 - 1.2 mg/dL   Alkaline Phosphatase 100 44 - 121 IU/L   AST 16 0 - 40 IU/L   ALT 17 0 - 44 IU/L  TSH     Status: None   Collection Time: 03/27/23  1:35 PM  Result Value Ref Range   TSH 1.410 0.450 - 4.500 uIU/mL  CBC  with Differential     Status: None   Collection Time: 05/06/23 12:32 AM  Result Value Ref Range   WBC 9.1 4.0 - 10.5 K/uL   RBC 5.27 4.22 - 5.81 MIL/uL   Hemoglobin 14.9 13.0 - 17.0 g/dL   HCT 29.5 28.4 - 13.2 %   MCV 84.3 80.0 - 100.0 fL   MCH 28.3 26.0 - 34.0 pg   MCHC 33.6 30.0 - 36.0 g/dL   RDW 44.0 10.2 - 72.5 %   Platelets 214 150 - 400 K/uL   nRBC 0.0 0.0 - 0.2 %   Neutrophils Relative % 74 %   Neutro Abs 6.7 1.7 - 7.7 K/uL   Lymphocytes Relative 17 %   Lymphs Abs 1.6 0.7 - 4.0 K/uL   Monocytes Relative 8 %   Monocytes Absolute 0.7 0.1 - 1.0 K/uL   Eosinophils Relative 1 %   Eosinophils Absolute 0.1 0.0 - 0.5 K/uL   Basophils Relative 0 %   Basophils Absolute 0.0 0.0 - 0.1 K/uL   Immature Granulocytes 0 %   Abs Immature Granulocytes 0.03 0.00 - 0.07 K/uL    Comment: Performed at [facility], [address], [place], [place] [address]  Basic metabolic panel     Status: Abnormal   Collection Time: 05/06/23 12:32 AM  Result Value Ref Range   Sodium 138 135 - 145 mmol/L   Potassium 3.2 (L) 3.5 - 5.1 mmol/L   Chloride 101 98 - 111 mmol/L   CO2 26 22 - 32 mmol/L   Glucose, Bld 100 (H) 70 - 99 mg/dL    Comment: Glucose reference range applies only to samples taken after fasting for at least 8 hours.   BUN 16 8 - 23 mg/dL   Creatinine, Ser 0.34 0.61 - 1.24 mg/dL   Calcium 9.3 8.9 - 74.2 mg/dL   GFR, Estimated >59 >56 mL/min    Comment: (NOTE) Calculated using the CKD-EPI Creatinine Equation (2021)    Anion gap 11 5 - 15    Comment: Performed at [facility], [address] [place], [place] [address]  Magnesium     Status: None   Collection Time: 05/06/23 12:32 AM  Result Value Ref Range   Magnesium 2.0 1.7 - 2.4 mg/dL    Comment: Performed at [facility], [address], [place], [place] [address]  CBC with Differential/Platelet     Status: None   Collection  Time: 05/18/23  8:35 AM  Result Value Ref Range    WBC 5.8 3.4 - 10.8 x10E3/uL   RBC 5.33 4.14 - 5.80 x10E6/uL   Hemoglobin 15.2 13.0 - 17.7 g/dL   Hematocrit 16.1 09.6 - 51.0 %   MCV 88 79 - 97 fL   MCH 28.5 26.6 - 33.0 pg   MCHC 32.3 31.5 - 35.7 g/dL   RDW 04.5 40.9 - 81.1 %   Platelets 218 150 - 450 x10E3/uL   Neutrophils 62 Not Estab. %   Lymphs 26 Not Estab. %   Monocytes 8 Not Estab. %   Eos 3 Not Estab. %   Basos 1 Not Estab. %   Neutrophils Absolute 3.6 1.4 - 7.0 x10E3/uL   Lymphocytes Absolute 1.5 0.7 - 3.1 x10E3/uL   Monocytes Absolute 0.5 0.1 - 0.9 x10E3/uL   EOS (ABSOLUTE) 0.2 0.0 - 0.4 x10E3/uL   Basophils Absolute 0.0 0.0 - 0.2 x10E3/uL   Immature Granulocytes 0 Not Estab. %   Immature Grans (Abs) 0.0 0.0 - 0.1 x10E3/uL  Testosterone, Free, Total, SHBG     Status: Abnormal   Collection Time: 05/18/23  8:35 AM  Result Value Ref Range   Testosterone 797 264 - 916 ng/dL    Comment: Adult male reference interval is based on a population of healthy nonobese males (BMI <30) between 63 and 79 years old. Travison, et.al. JCEM [phone]. PMID: [id number].    Testosterone, Free 18.8 (H) 6.6 - 18.1 pg/mL   Sex Hormone Binding 16.8 (L) 19.3 - 76.4 nmol/L  PSA     Status: None   Collection Time: 05/18/23  8:35 AM  Result Value Ref Range   Prostate Specific Ag, Serum 1.9 0.0 - 4.0 ng/mL    Comment: Roche ECLIA methodology. According to the American Urological Association, Serum PSA should decrease and remain at undetectable levels after radical prostatectomy. The AUA defines biochemical recurrence as an initial PSA value 0.2 ng/mL or greater followed by a subsequent confirmatory PSA value 0.2 ng/mL or greater. Values obtained with different assay methods or kits cannot be used interchangeably. Results cannot be interpreted as absolute evidence of the presence or absence of malignant disease.      Assessment & Plan:   1. Hypogonadism  2.  Obesity/sleep apnea  3.  Vitamin D deficiency.  -Testosterone  replacement helped improve his testosterone from 74 ->286 -> 782 -> 509 > 978 > 659-> 437 >494> 327-> 506-> 640 -> 673 -> 529 -> 446 -> 340 -> 488 ->325 -> 700 -> 135 -> 228 -> 624 -> 475-> 797   No adverse events noted. 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   -It is possible that it is multifactorial including exposure to heavy opioids, multiple back surgeries, prior inguinal surgery, or idiopathic.  -Prolactin level is favorable at 6.1, he will not need pituitary/sella imaging for now.  -He has benefited from testosterone replacement therapy and he wishes to be continued on treatment.     -Due to rising PSA to 1.9, he is advised to lower his testosterone to 100 mg IM every 10 days to give him a total of 300 mg monthly.   Target testosterone level 300-600 mcg/dL.   -He we will return in 32-months with total and free testosterone measurements.    Regarding his sleep apnea: He would benefit the most from weight loss.  He is encouraged to stay on change lifestyle nutrition.  -  he acknowledges that there is a room for improvement in his food and drink choices. - Suggestion is made for him to avoid simple carbohydrates  from his diet including Cakes, Sweet Desserts, Ice Cream, Soda (diet and regular), Sweet Tea, Candies, Chips, Cookies, Store Bought Juices, Alcohol in Excess of  1-2 drinks a day, Artificial Sweeteners,  Coffee Creamer, and "Sugar-free" Products, Lemonade. This will help patient  avoid unintended weight gain.  He is advised to continue vitamin D supplement 2000 units daily.  He will continue to have above target LDL at 100, advised to limit intake of animal products including processed meat, cheese and full fat daily.    - I advised patient to maintain close follow up with Stewart, Jeff Radon, MD for primary care needs.    I spent  22  minutes in the care of the patient today including review of labs  from Thyroid Function, CMP, and other relevant labs ; imaging/biopsy records (current and previous including abstractions from other facilities); face-to-face time discussing  his lab results and symptoms, medications doses, his options of short and long term treatment based on the latest standards of care / guidelines;   and documenting the encounter.  Vilma Prader Sanko  participated in the discussions, expressed understanding, and voiced agreement with the above plans.  All questions were answered to his satisfaction. he is encouraged to contact clinic should he have any questions or concerns prior to his return visit.    Follow up plan: Return in about 4 months (around 09/25/2023) for Fasting Labs  in AM B4 8, A1c -[place].  Marquis Lunch, MD Phone: [phone]  Fax: [phone]   This note was partially dictated with voice recognition software. Similar sounding words can be transcribed inadequately or may not  be corrected upon review.  05/26/2023, 11:34 AM

## 2023-05-30 ENCOUNTER — Telehealth: Payer: Self-pay | Admitting: Neurology

## 2023-05-30 NOTE — Progress Notes (Signed)
 [facility] INTERPRETATION REPORT PSG    STUDY DATE: 05/08/2023     PATIENT NAME:  Jeff Stewart, Jeff Stewart         DATE OF BIRTH:  1955/11/28  PATIENT ID:  [id number]    TYPE OF STUDY:  BiPAP ST  READING PHYSICIAN: Melvyn Novas,  SCORING TECHNICIAN: Domingo Cocking   INDICATIONS:  This 68 year-old Male suffers from RLS, myoclonic  leg movements, and from sleep apnea . The Epworth Sleepiness Scale was 23 out of 24 (scores above or equal to 10 are suggestive of hypersomnolence).  DESCRIPTION: A sleep technologist was in attendance for the duration of the recording.  Data collection, scoring, video monitoring, and reporting were performed in compliance with the AASM Manual for the Scoring of Sleep and Associated Events; A  board certified sleep physician reviewed each epoch of the study.  ADDITIONAL INFORMATION:  Height: 73.0 in Weight: 257 lb (BMI 33) Neck Size: 19.5 in Medications: Tylenol, Zyrtec, Vitamin D, Flonase, Lasix, Hydrodiuril, Cozaar, Protonix, Zoloft, Depotestosterone Cypionate, Turmeric, Tessalon pearls  FINDINGS:  Please refer to the attached summary for additional quantitative information.  STUDY DETAILS: Lights off was at 22:03: and lights on 23:29: (86 minutes hours in bed). This study was performed with an initial diagnostic portion- followed by positive airway pressure titration.  DIAGNOSTIC PART 1 :   SLEEP CONTINUITY AND SLEEP ARCHITECTURE:  The diagnostic portion of the study began at 22:03 and ended at 22:04, for a recording time of 0h 1.52m minutes.  Total sleep time was 00 minutes (0.0% supine;  0.0% lateral;  0.0% prone, 0.0% REM sleep), with a decreased sleep efficiency . Sleep latency was normal at 11.5 minutes. REM void.  There were 27 minutes of NREM 2 sleep and 4 minutes of NREM 1 sleep, wake after sleep onset was 91 minutes.      AROUSAL (Baseline):  the patient had ongoing leg movements some bilaterally synchronous, some  monoliteral.  He was unable to initiate sustained sleep.  the technologist advanced to titration part 2 .    RESPIRATORY MONITORING:  Based on CMS criteria (using a 4% oxygen desaturation rule for scoring hypopneas), there were 0 apneas (0 obstructive; 0 central; 0 mixed), and 2 hypopneas.  Apnea index was 0.0. Hypopnea index was 0.0. The apnea-hypopnea index was 0.0 overall (0.0 supine; 0.0 REM, 0.0 supine REM). There were 0 respiratory effort-related arousals (RERAs).  The RERA index was 0.0 events/hr. Total respiratory disturbance index (RDI) was 0.0 events/hr. RDI results showed: supine RDI  0.0 /hr; non-supine RDI 0.0 /hr; REM RDI 0.0 /hr, supine REM RDI 0.0 /hr.  Respiratory events were associated with oxyhemoglobin desaturations (nadir --%) from a normal baseline (mean 97%). Total time spent at, or below 88% was 0.0 minutes, or 0.0%  of total sleep time. Snoring was absent. There were 0.0 occurrences of Cheyne Stokes breathing.   LIMB MOVEMENTS: There were ongoing periodic limb movements of associated with arousal.   OXIMETRY: Total sleep time spent at, or below 88% was 0.0 minutes, or 0.0% of total sleep time. Snoring was classified as loud.   BODY POSITION: only lateral (left and right sided) sleep was recorded.           IMPRESSION: The sleep study was not successful - severe myoclonic leg movements interfered- we have rescheduled for another week.         RECOMMENDATIONS: 05-15-2023 repeat attempt      Melvyn Novas,  MD  [facility] CPAP/Bilevel Report    General Information  Name: Jeff Stewart, Jeff Stewart BMI: 33 Physician: ,   ID: [id number] Height: 73 in Technician: Domingo Cocking  Sex: Male Weight: 257 lb Record: [id number]  Age: 66 [[birth year]] Date: 05/08/2023 Scorer: Domingo Cocking   Pressure IPAP/EPAP 00 06 / 00 10 / 05 12 / 07   O2 Vol 0.0 0.0 0.0 0.0  Time TRT 0.81m 1.58m 44.64m 41.25m   TST 0.83m 0.27m 28.60m  3.62m  Sleep Stage % Wake 0.0 0.0 14.9 0.0   % REM 0.0 0.0 0.0 0.0   % N1 0.0 0.0 12.3 0.0   % N2 0.0 0.0 87.7 100.0   % N3 0.0 0.0 0.0 0.0  Respiratory Total Events 0 0 2 0   Obs. Apn. 0 0 0 0   Mixed Apn. 0 0 0 0   Cen. Apn. 0 0 0 0   Hypopneas 0 0 2 0   AHI 0.00 0.00 4.21 0.00   Supine AHI 0.00 0.00 0.00 0.00   Prone AHI 0.00 0.00 0.00 0.00   Side AHI 0.00 0.00 4.21 0.00  Respiratory (4%) Hypopneas (4%) 0.00 0.00 2.00 0.00   AHI (4%) 0.00 0.00 4.21 0.00   Supine AHI (4%) 0.00 0.00 0.00 0.00   Prone AHI (4%) 0.00 0.00 0.00 0.00   Side AHI (4%) 0.00 0.00 4.21 0.00  Desat Profile <= 90% 0.18m 0.51m 5.48m 0.94m   <= 80% 0.30m 0.29m 4.62m 0.40m   <= 70% 0.70m 0.50m 4.56m 0.64m   <= 60% 0.1m 0.14m 4.106m 0.32m  Arousal Index Apnea 0.0 0.0 0.0 0.0   Hypopnea 0.0 0.0 0.0 0.0   LM 0.0 0.0 6.3 0.0   Spontaneous 0.0 0.0 6.3 0.0  [facility] Split Summary    General Information  Name: Jeff Stewart, Jeff Stewart BMI: 33.91 Physician: Melvyn Novas, MD  ID: [id number] Height: 73.0 in Technician: Domingo Cocking, RPSGT  Sex: Male Weight: 257.0 lb Record: [id number]  Age: 43 [07-18-1955] Date: 05/08/2023     Medical & Medication History    Jeff Stewart is a 68 y.o. male patient who is seen upon referral on 10/06/2022 from Dr. Louanne Skye for a Sleep consultation. This patient has been dx with OSA many years ago but was never able to use an tolerate PAP therapy, had several nasal surgeries. He had not shared with [place] before , but he has undergone a HST ( not mentioned in his referral ) and he has seen ENT for possible  inspire - was deemed "not a good candidate" following  an upper airway endoscopy. He has in the past undergone a UPPP in [facility] , Dr Lovett Calender. HST result was not available for Dr. Jenne Pane either. Patient doesn't recall who did this test. Who referred for this test? None is mentioned in EPIC- Chief concern according to patient : I need to find a way to treat  my apnea. The patient had the first sleep study in the year 2000 no results in EPIC none documented but he has pulmonary care, orthopedist is Dr Sharolyn Douglas, did his 5th and 6 th back surgery , had many neck surgeries. 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 Had Nissen fundoplication , this patient also has a morphine pump implanted, he has a spinal cord stimulator implanted which has been inactive for the last 2 or 3 years, he is s/p UPPP and 6 nasal surgeries, he had  a cervical anterior fusion  Tylenol, Zyrtec, Vitamin D, Flonase, Lasix, Hydrodiuril, Cozaar, Protonix, Zoloft, Depotestosterone Cypionate, Turmeric, Tessalon perles   Sleep Disorder: severe limb movements , sleep apnea , severe .       Comments   Patient arrived for a BiPAP titration and had complaints of restless legs. Patient chose to D/C study as he was very uncomfortable.    Baseline Sleep Stage Information Baseline start time: 10:03:15 PM Baseline end time: 10:04:05 PM   Time Total Supine Side Prone Upright  Recording 0h 1.64m 0h 1.71m 0h 0.76m 0h 0.56m 0h 0.70m  Sleep 0h 0.45m 0h 0.34m 0h 0.67m 0h 0.9m 0h 0.44m   Latency N1 N2 N3 REM Onset Per. Slp. Eff.  Actual 0h 0.81m 0h 0.25m 0h 0.46m 0h 0.75m 0h 11.40m 0h 0.87m 0.00%   Stg Dur Wake N1 N2 N3 REM  Total 0.0 0.0 0.0 0.0 0.0  Supine 0.0 0.0 0.0 0.0 0.0  Side 0.0 0.0 0.0 0.0 0.0  Prone 0.0 0.0 0.0 0.0 0.0  Upright 0.0 0.0 0.0 0.0 0.0   Stg % Wake N1 N2 N3 REM  Total 0.0 0.0 0.0 0.0 0.0  Supine 0.0 0.0 0.0 0.0 0.0  Side 0.0 0.0 0.0 0.0 0.0  Prone 0.0 0.0 0.0 0.0 0.0  Upright 0.0 0.0 0.0 0.0 0.0    CPAP Sleep Stage Information CPAP start time: 10:04:05 PM CPAP end time: 11:29:35 PM   Time Total Supine Side Prone Upright  Recording (TRT) 1h 25.66m 0h 24.70m 1h 1.17m 0h 0.22m 0h 0.12m  Sleep (TST) 0h 32.64m 0h 0.48m 0h 32.22m 0h 0.37m 0h 0.56m   Latency N1 N2 N3 REM Onset Per. Slp. Eff.  Actual 0h 10.22m 0h 12.30m 0h 0.62m 0h 0.43m 0h 10.66m 0h 10.13m 37.43%    Stg Dur Wake N1 N2 N3 REM  Total 15.5 3.5 28.5 0.0 0.0  Supine 4.0 0.0 0.0 0.0 0.0  Side 11.5 3.5 28.5 0.0 0.0  Prone 0.0 0.0 0.0 0.0 0.0  Upright 0.0 0.0 0.0 0.0 0.0   Stg % Wake N1 N2 N3 REM  Total 32.6 10.9 89.1 0.0 0.0  Supine 8.4 0.0 0.0 0.0 0.0  Side 24.2 10.9 89.1 0.0 0.0  Prone 0.0 0.0 0.0 0.0 0.0  Upright 0.0 0.0 0.0 0.0 0.0    Baseline Respiratory Information Apnea Summary Sub Supine Side Prone Upright  Total 0 Total 0 0 0 0 0    REM 0 0 0 0 0    NREM 0 0 0 0 0  Obs 0 REM 0 0 0 0 0    NREM 0 0 0 0 0  Mix 0 REM 0 0 0 0 0    NREM 0 0 0 0 0  Cen 0 REM 0 0 0 0 0    NREM 0 0 0 0 0   Rera Summary Sub Supine Side Prone Upright  Total 0 Total 0 0 0 0 0    REM 0 0 0 0 0    NREM 0 0 0 0 0   Hypopnea Summary Sub Supine Side Prone Upright  Total 0 Total 0 0 0 0 0    REM 0 0 0 0 0    NREM 0 0 0 0 0   4% Hypopnea Summary Sub Supine Side Prone Upright  Total (4%) 0 Total 0 0 0 0 0    REM 0 0 0 0 0    NREM 0 0 0 0 0  AHI Total Obs Mix Cen  0.00 Apnea 0.00 0.00 0.00 0.00   Hypopnea 0.00 -- -- --  0.00 Hypopnea (4%) 0.00 -- -- --    Total Supine Side Prone Upright  Position AHI 0.00 0.00 0.00 0.00 0.00  REM AHI 0.00   NREM AHI 0.00   Position RDI 0.00 0.00 0.00 0.00 0.00  REM RDI 0.00   NREM RDI 0.00    4% Hypopnea Total Supine Side Prone Upright  Position AHI (4%) 0.00 0.00 0.00 0.00 0.00  REM AHI (4%) 0.00   NREM AHI (4%) 0.00   Position RDI (4%) 0.00 0.00 0.00 0.00 0.00  REM RDI (4%) 0.00   NREM RDI (4%) 0.00    CPAP Respiratory Information Apnea Summary Sub Supine Side Prone Upright  Total 0 Total 0 0 0 0 0    REM 0 0 0 0 0    NREM 0 0 0 0 0  Obs 0 REM 0 0 0 0 0    NREM 0 0 0 0 0  Mix 0 REM 0 0 0 0 0    NREM 0 0 0 0 0  Cen 0 REM 0 0 0 0 0    NREM 0 0 0 0 0   Rera Summary Sub Supine Side Prone Upright  Total 0 Total 0 0 0 0 0    REM 0 0 0 0 0    NREM 0 0 0 0 0   Hypopnea Summary Sub Supine Side Prone Upright  Total 2 Total 2 0 2 0 0     REM 0 0 0 0 0    NREM 2 0 2 0 0   4% Hypopnea Summary Sub Supine Side Prone Upright  Total (4%) 2 Total 2 0 2 0 0    REM 0 0 0 0 0    NREM 2 0 2 0 0     AHI Total Obs Mix Cen  3.75 Apnea 0.00 0.00 0.00 0.00   Hypopnea 3.75 -- -- --  3.75 Hypopnea (4%) 3.75 -- -- --    Total Supine Side Prone Upright  Position AHI 3.75 0.00 3.75 0.00 0.00  REM AHI 0.00   NREM AHI 3.75   Position RDI 3.75 0.00 3.75 0.00 0.00  REM RDI 0.00   NREM RDI 3.75    4% Hypopnea Total Supine Side Prone Upright  Position AHI (4%) 3.75 0.00 3.75 0.00 0.00  REM AHI (4%) 0.00   NREM AHI (4%) 3.75   Position RDI (4%) 3.75 0.00 3.75 0.00 0.00  REM RDI (4%) 0.00   NREM RDI (4%) 3.75    Desaturation Information (Baseline)  <100% <90% <80% <70% <60% <50% <40%  Supine 0 0 0 0 0 0 0  Side 0 0 0 0 0 0 0  Prone 0 0 0 0 0 0 0  Upright 0 0 0 0 0 0 0  Total 0 0 0 0 0 0 0  Desaturation threshold setting: 4% Minimum desaturation setting: 10 seconds SaO2 nadir: --% The longest event was a 0 sec N/A Apneawith a minimum SaO2 of --%. The lowest SaO2 was --% associated with a 00 sec N/A Apnea. Awakening/Arousal Information (Baseline) # of Awakenings 0  Wake after sleep onset 0.61m  Wake after persistent sleep 0.84m   Arousal Assoc. Arousals Index  Apneas 0 0.0  Hypopneas 0 0.0  Leg Movements 0 0.0  Snore 0.0 0.0  PTT Arousals 0 0.0  Spontaneous 0 0.0  Total 0 0.0   Desaturation Information (CPAP)  <100% <90% <80% <70% <60% <50% <40%  Supine 0 0 0 0 0 0 0  Side 4 0 0 0 0 0 0  Prone 0 0 0 0 0 0 0  Upright 0 0 0 0 0 0 0  Total 4 0 0 0 0 0 0  Desaturation threshold setting: 4% Minimum desaturation setting: 10 seconds SaO2 nadir: 90% The longest event was a 16 sec obstructive Hypopnea with a minimum SaO2 of 95%. The lowest SaO2 was 93% associated with a 12 sec obstructive Hypopnea. Awakening/Arousal Information (CPAP) # of Awakenings 1  Wake after sleep onset 43.74m  Wake after persistent sleep 43.90m    Arousal Assoc. Arousals Index  Apneas 0 0.0  Hypopneas 0 0.0  Leg Movements 3 5.6  Snore 0.0 0.0  PTT Arousals 0 0.0  Spontaneous 3 5.6  Total 6 11.3     EKG Rates (Baseline) EKG Avg Max Min  Awake 0 0 0  Asleep 0 0 0  EKG Events: N/A Myoclonus Information (Baseline) PLMS LMs Index  Total LMs during PLMS 0 0.0  LMs w/ Microarousals 0 0.0   LM LMs Index  w/ Microarousal 0 0.0  w/ Awakening 0 0.0  w/ Resp Event 0 0.0  Spontaneous 0 0.0  Total 0 0.0   EKG Rates (CPAP) EKG Avg Max Min  Awake 73 75 68  Asleep 73 79 68  EKG Events: N/A Myoclonus Information (CPAP) PLMS LMs Index  Total LMs during PLMS 118 221.3  LMs w/ Microarousals 3 5.6   LM LMs Index  w/ Microarousal 0 0.0  w/ Awakening 0 0.0  w/ Resp Event 0 0.0  Spontaneous 0 0.0  Total 0 0.0

## 2023-05-30 NOTE — Telephone Encounter (Signed)
 [facility] INTERPRETATION REPORT PSG      This was an unsuccessful attempt to perform a PSG-  05-08-2023 Rescheduled for 05-15-2023. CD      STUDY DATE: 05/08/2023     PATIENT NAME:  Jeff Stewart, Jeff Stewart         DATE OF BIRTH:  [birth year]  PATIENT ID:  [id number]    TYPE OF STUDY:  BiPAP ST  READING PHYSICIAN: Melvyn Novas,  SCORING TECHNICIAN: Domingo Cocking   INDICATIONS:  This 68 year-old Male suffers from RLS, myoclonic  leg movements, and from sleep apnea . The Epworth Sleepiness Scale was 23 out of 24 (scores above or equal to 10 are suggestive of hypersomnolence).  DESCRIPTION: A sleep technologist was in attendance for the duration of the recording.  Data collection, scoring, video monitoring, and reporting were performed in compliance with the AASM Manual for the Scoring of Sleep and Associated Events; A  board certified sleep physician reviewed each epoch of the study.  ADDITIONAL INFORMATION:  Height: 73.0 in Weight: 257 lb (BMI 33) Neck Size: 19.5 in Medications: Tylenol, Zyrtec, Vitamin D, Flonase, Lasix, Hydrodiuril, Cozaar, Protonix, Zoloft, Depotestosterone Cypionate, Turmeric, Tessalon pearls  FINDINGS:  Please refer to the attached summary for additional quantitative information.  STUDY DETAILS: Lights off was at 22:03: and lights on 23:29: (86 minutes hours in bed). This study was performed with an initial diagnostic portion- followed by positive airway pressure titration.  DIAGNOSTIC PART 1 :   SLEEP CONTINUITY AND SLEEP ARCHITECTURE:  The diagnostic portion of the study began at 22:03 and ended at 22:04, for a recording time of 0h 1.104m minutes.  Total sleep time was 00 minutes (0.0% supine;  0.0% lateral;  0.0% prone, 0.0% REM sleep), with a decreased sleep efficiency . Sleep latency was normal at 11.5 minutes. REM void.  There were 27 minutes of NREM 2 sleep and 4 minutes of NREM 1 sleep, wake after sleep onset was 91 minutes.       AROUSAL (Baseline):  the patient had ongoing leg movements some bilaterally synchronous, some monoliteral.  He was unable to initiate sustained sleep.  the technologist advanced to titration part 2 .    RESPIRATORY MONITORING:  Based on CMS criteria (using a 4% oxygen desaturation rule for scoring hypopneas), there were 0 apneas (0 obstructive; 0 central; 0 mixed), and 2 hypopneas.  Apnea index was 0.0. Hypopnea index was 0.0. The apnea-hypopnea index was 0.0 overall (0.0 supine; 0.0 REM, 0.0 supine REM). There were 0 respiratory effort-related arousals (RERAs).  The RERA index was 0.0 events/hr. Total respiratory disturbance index (RDI) was 0.0 events/hr. RDI results showed: supine RDI  0.0 /hr; non-supine RDI 0.0 /hr; REM RDI 0.0 /hr, supine REM RDI 0.0 /hr.  Respiratory events were associated with oxyhemoglobin desaturations (nadir --%) from a normal baseline (mean 97%). Total time spent at, or below 88% was 0.0 minutes, or 0.0%  of total sleep time. Snoring was absent. There were 0.0 occurrences of Cheyne Stokes breathing.   LIMB MOVEMENTS: There were ongoing periodic limb movements of associated with arousal.   OXIMETRY: Total sleep time spent at, or below 88% was 0.0 minutes, or 0.0% of total sleep time. Snoring was classified as loud.   BODY POSITION: only lateral (left and right sided) sleep was recorded.           IMPRESSION: The sleep study was not successful - rescheduled for another week.         RECOMMENDATIONS:  Melvyn Novas,  MD    CODED DIAGNOSES:         [facility] CPAP/Bilevel Report    General Information  Name: Jeff Stewart, Jeff Stewart BMI: 40 Physician: ,   ID: [id number] Height: 73 in Technician: Domingo Cocking  Sex: Male Weight: 257 lb Record: [id number]  Age: 46 [15-Aug-1955] Date: 05/08/2023 Scorer: Domingo Cocking   Pressure IPAP/EPAP 00 06 / 00 10 / 05 12 / 07   O2 Vol 0.0 0.0 0.0 0.0  Time TRT 0.59m  1.28m 44.61m 41.29m   TST 0.35m 0.57m 28.34m 3.65m  Sleep Stage % Wake 0.0 0.0 14.9 0.0   % REM 0.0 0.0 0.0 0.0   % N1 0.0 0.0 12.3 0.0   % N2 0.0 0.0 87.7 100.0   % N3 0.0 0.0 0.0 0.0  Respiratory Total Events 0 0 2 0   Obs. Apn. 0 0 0 0   Mixed Apn. 0 0 0 0   Cen. Apn. 0 0 0 0   Hypopneas 0 0 2 0   AHI 0.00 0.00 4.21 0.00   Supine AHI 0.00 0.00 0.00 0.00   Prone AHI 0.00 0.00 0.00 0.00   Side AHI 0.00 0.00 4.21 0.00  Respiratory (4%) Hypopneas (4%) 0.00 0.00 2.00 0.00   AHI (4%) 0.00 0.00 4.21 0.00   Supine AHI (4%) 0.00 0.00 0.00 0.00   Prone AHI (4%) 0.00 0.00 0.00 0.00   Side AHI (4%) 0.00 0.00 4.21 0.00  Desat Profile <= 90% 0.76m 0.25m 5.56m 0.40m   <= 80% 0.27m 0.45m 4.8m 0.58m   <= 70% 0.29m 0.21m 4.14m 0.83m   <= 60% 0.54m 0.48m 4.69m 0.5m  Arousal Index Apnea 0.0 0.0 0.0 0.0   Hypopnea 0.0 0.0 0.0 0.0   LM 0.0 0.0 6.3 0.0   Spontaneous 0.0 0.0 6.3 0.0  [facility] Summary    General Information  Name: Jeff Stewart, Jeff Stewart BMI: 33.91 Physician: Melvyn Novas, MD  ID: [id number] Height: 73.0 in Technician: Domingo Cocking, RPSGT  Sex: Male Weight: 257.0 lb Record: [id number]  Age: 23 [1955/11/22] Date: 05/08/2023     Medical & Medication History    Jeff Stewart is a 68 y.o. male patient who is seen upon referral on 10/06/2022 from Dr. Louanne Skye for a Sleep consultation. This patient has been dx with OSA many years ago but was never able to use an tolerate PAP therapy, had several nasal surgeries. He had not shared with [place] before , but he has undergone a HST ( not mentioned in his referral ) and he has seen ENT for possible  inspire - was deemed "not a good candidate" following  an upper airway endoscopy. He has in the past undergone a UPPP in [facility] , Dr Lovett Calender. HST result was not available for Dr. Jenne Pane either. Patient doesn't recall who did this test. Who referred for this test? None is mentioned in EPIC- Chief concern according to  patient : I need to find a way to treat my apnea. The patient had the first sleep study in the year 2000 no results in EPIC none documented but he has pulmonary care, orthopedist is Dr Sharolyn Douglas, did his 5th and 6 th back surgery , had many neck surgeries. 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 Had Nissen fundoplication , this patient also has a morphine pump implanted, he has a spinal cord stimulator implanted which has been inactive  for the last 2 or 3 years, he is s/p UPPP and 6 nasal surgeries, he had a cervical anterior fusion  Tylenol, Zyrtec, Vitamin D, Flonase, Lasix, Hydrodiuril, Cozaar, Protonix, Zoloft, Depotestosterone Cypionate, Turmeric, Tessalon perles   Sleep Disorder: severe limb movements , sleep apnea , severe .       Comments   Patient arrived for a BiPAP titration and had complaints of restless legs. Patient chose to D/C study as he was very uncomfortable.    Baseline Sleep Stage Information Baseline start time: 10:03:15 PM Baseline end time: 10:04:05 PM   Time Total Supine Side Prone Upright  Recording 0h 1.42m 0h 1.61m 0h 0.67m 0h 0.34m 0h 0.62m  Sleep 0h 0.53m 0h 0.73m 0h 0.56m 0h 0.64m 0h 0.42m   Latency N1 N2 N3 REM Onset Per. Slp. Eff.  Actual 0h 0.44m 0h 0.10m 0h 0.96m 0h 0.81m 0h 11.13m 0h 0.51m 0.00%   Stg Dur Wake N1 N2 N3 REM  Total 0.0 0.0 0.0 0.0 0.0  Supine 0.0 0.0 0.0 0.0 0.0  Side 0.0 0.0 0.0 0.0 0.0  Prone 0.0 0.0 0.0 0.0 0.0  Upright 0.0 0.0 0.0 0.0 0.0   Stg % Wake N1 N2 N3 REM  Total 0.0 0.0 0.0 0.0 0.0  Supine 0.0 0.0 0.0 0.0 0.0  Side 0.0 0.0 0.0 0.0 0.0  Prone 0.0 0.0 0.0 0.0 0.0  Upright 0.0 0.0 0.0 0.0 0.0    CPAP Sleep Stage Information CPAP start time: 10:04:05 PM CPAP end time: 11:29:35 PM   Time Total Supine Side Prone Upright  Recording (TRT) 1h 25.17m 0h 24.30m 1h 1.48m 0h 0.49m 0h 0.35m  Sleep (TST) 0h 32.66m 0h 0.9m 0h 32.56m 0h 0.47m 0h 0.47m   Latency N1 N2 N3 REM Onset Per. Slp. Eff.  Actual 0h 10.63m 0h 12.37m 0h  0.73m 0h 0.63m 0h 10.55m 0h 10.72m 37.43%   Stg Dur Wake N1 N2 N3 REM  Total 15.5 3.5 28.5 0.0 0.0  Supine 4.0 0.0 0.0 0.0 0.0  Side 11.5 3.5 28.5 0.0 0.0  Prone 0.0 0.0 0.0 0.0 0.0  Upright 0.0 0.0 0.0 0.0 0.0   Stg % Wake N1 N2 N3 REM  Total 32.6 10.9 89.1 0.0 0.0  Supine 8.4 0.0 0.0 0.0 0.0  Side 24.2 10.9 89.1 0.0 0.0  Prone 0.0 0.0 0.0 0.0 0.0  Upright 0.0 0.0 0.0 0.0 0.0    Baseline Respiratory Information Apnea Summary Sub Supine Side Prone Upright  Total 0 Total 0 0 0 0 0    REM 0 0 0 0 0    NREM 0 0 0 0 0  Obs 0 REM 0 0 0 0 0    NREM 0 0 0 0 0  Mix 0 REM 0 0 0 0 0    NREM 0 0 0 0 0  Cen 0 REM 0 0 0 0 0    NREM 0 0 0 0 0   Rera Summary Sub Supine Side Prone Upright  Total 0 Total 0 0 0 0 0    REM 0 0 0 0 0    NREM 0 0 0 0 0   Hypopnea Summary Sub Supine Side Prone Upright  Total 0 Total 0 0 0 0 0    REM 0 0 0 0 0    NREM 0 0 0 0 0   4% Hypopnea Summary Sub Supine Side Prone Upright  Total (4%) 0 Total 0 0 0 0 0    REM  0 0 0 0 0    NREM 0 0 0 0 0     AHI Total Obs Mix Cen  0.00 Apnea 0.00 0.00 0.00 0.00   Hypopnea 0.00 -- -- --  0.00 Hypopnea (4%) 0.00 -- -- --    Total Supine Side Prone Upright  Position AHI 0.00 0.00 0.00 0.00 0.00  REM AHI 0.00   NREM AHI 0.00   Position RDI 0.00 0.00 0.00 0.00 0.00  REM RDI 0.00   NREM RDI 0.00    4% Hypopnea Total Supine Side Prone Upright  Position AHI (4%) 0.00 0.00 0.00 0.00 0.00  REM AHI (4%) 0.00   NREM AHI (4%) 0.00   Position RDI (4%) 0.00 0.00 0.00 0.00 0.00  REM RDI (4%) 0.00   NREM RDI (4%) 0.00    CPAP Respiratory Information Apnea Summary Sub Supine Side Prone Upright  Total 0 Total 0 0 0 0 0    REM 0 0 0 0 0    NREM 0 0 0 0 0  Obs 0 REM 0 0 0 0 0    NREM 0 0 0 0 0  Mix 0 REM 0 0 0 0 0    NREM 0 0 0 0 0  Cen 0 REM 0 0 0 0 0    NREM 0 0 0 0 0   Rera Summary Sub Supine Side Prone Upright  Total 0 Total 0 0 0 0 0    REM 0 0 0 0 0    NREM 0 0 0 0 0   Hypopnea Summary Sub Supine Side Prone  Upright  Total 2 Total 2 0 2 0 0    REM 0 0 0 0 0    NREM 2 0 2 0 0   4% Hypopnea Summary Sub Supine Side Prone Upright  Total (4%) 2 Total 2 0 2 0 0    REM 0 0 0 0 0    NREM 2 0 2 0 0     AHI Total Obs Mix Cen  3.75 Apnea 0.00 0.00 0.00 0.00   Hypopnea 3.75 -- -- --  3.75 Hypopnea (4%) 3.75 -- -- --    Total Supine Side Prone Upright  Position AHI 3.75 0.00 3.75 0.00 0.00  REM AHI 0.00   NREM AHI 3.75   Position RDI 3.75 0.00 3.75 0.00 0.00  REM RDI 0.00   NREM RDI 3.75    4% Hypopnea Total Supine Side Prone Upright  Position AHI (4%) 3.75 0.00 3.75 0.00 0.00  REM AHI (4%) 0.00   NREM AHI (4%) 3.75   Position RDI (4%) 3.75 0.00 3.75 0.00 0.00  REM RDI (4%) 0.00   NREM RDI (4%) 3.75    Desaturation Information (Baseline)  <100% <90% <80% <70% <60% <50% <40%  Supine 0 0 0 0 0 0 0  Side 0 0 0 0 0 0 0  Prone 0 0 0 0 0 0 0  Upright 0 0 0 0 0 0 0  Total 0 0 0 0 0 0 0  Desaturation threshold setting: 4% Minimum desaturation setting: 10 seconds SaO2 nadir: --% The longest event was a 0 sec N/A Apneawith a minimum SaO2 of --%. The lowest SaO2 was --% associated with a 00 sec N/A Apnea. Awakening/Arousal Information (Baseline) # of Awakenings 0  Wake after sleep onset 0.27m  Wake after persistent sleep 0.77m   Arousal Assoc. Arousals Index  Apneas 0 0.0  Hypopneas 0 0.0  Leg Movements 0 0.0  Snore 0.0 0.0  PTT Arousals 0 0.0  Spontaneous 0 0.0  Total 0 0.0   Desaturation Information (CPAP)  <100% <90% <80% <70% <60% <50% <40%  Supine 0 0 0 0 0 0 0  Side 4 0 0 0 0 0 0  Prone 0 0 0 0 0 0 0  Upright 0 0 0 0 0 0 0  Total 4 0 0 0 0 0 0  Desaturation threshold setting: 4% Minimum desaturation setting: 10 seconds SaO2 nadir: 90% The longest event was a 16 sec obstructive Hypopnea with a minimum SaO2 of 95%. The lowest SaO2 was 93% associated with a 12 sec obstructive Hypopnea. Awakening/Arousal Information (CPAP) # of Awakenings 1  Wake after sleep onset 43.25m   Wake after persistent sleep 43.89m   Arousal Assoc. Arousals Index  Apneas 0 0.0  Hypopneas 0 0.0  Leg Movements 3 5.6  Snore 0.0 0.0  PTT Arousals 0 0.0  Spontaneous 3 5.6  Total 6 11.3     EKG Rates (Baseline) EKG Avg Max Min  Awake 0 0 0  Asleep 0 0 0  EKG Events: N/A Myoclonus Information (Baseline) PLMS LMs Index  Total LMs during PLMS 0 0.0  LMs w/ Microarousals 0 0.0   LM LMs Index  w/ Microarousal 0 0.0  w/ Awakening 0 0.0  w/ Resp Event 0 0.0  Spontaneous 0 0.0  Total 0 0.0   EKG Rates (CPAP) EKG Avg Max Min  Awake 73 75 68  Asleep 73 79 68  EKG Events: N/A Myoclonus Information (CPAP) PLMS LMs Index  Total LMs during PLMS 118 221.3  LMs w/ Microarousals 3 5.6   LM LMs Index  w/ Microarousal 0 0.0  w/ Awakening 0 0.0  w/ Resp Event 0 0.0  Spontaneous 0 0.0  Total 0 0.0

## 2023-05-30 NOTE — Procedures (Signed)
 [facility] PAP TITRATION INTERPRETATION REPORT   STUDY DATE: 05/15/2023      PATIENT NAME:  Jeff Stewart         DATE OF BIRTH:  [birth year]  PATIENT ID:  [id number]    TYPE OF STUDY:  BiPAP  READING PHYSICIAN: Melvyn Novas, MD SCORING TECHNICIAN: Domingo Cocking, RPSGT   HISTORY: This 68 year-old Male patient returned one week after attempting a Bilevel titration which was not performed due to RLS/ Myoclonic activity that night- The Epworth Sleepiness Scale was 23 out of 24 (scores above or equal to 10 are suggestive of hypersomnolence).  DESCRIPTION: A sleep technologist was in attendance for the duration of the recording.  Data collection, scoring, video monitoring, and reporting were performed in compliance with the AASM Manual for the Scoring of Sleep and Associated Events; (Hypopnea is scored based on the criteria listed in Section VIII D. 1b in the AASM Manual V2.6 using a 4% oxygen desaturation rule or Hypopnea is scored based on the criteria listed in Section VIII D. 1a in the AASM Manual V2.6 using 3% oxygen desaturation and /or arousal rule).  A physician certified by the American Board of Sleep Medicine reviewed each epoch of the study.  ADDITIONAL INFORMATION:  Height: 73.0 in Weight: 257 lb (BMI 33) Neck Size: 19.5 in    MEDICATIONS: Tylenol, Zyrtec, Vitamin D, Flonase, Lasix, Hydrodiuril, Cozaar, Protonix, Zoloft, Depotestosterone Cypionate, Turmeric, Tessalon perles   SLEEP CONTINUITY AND SLEEP ARCHITECTURE:  Lights off was at 21:47: and lights on 04:49: (7.0 hours in bed). Total sleep time was 318.0 minutes (32.5% supine;  67.5% lateral;  0.0% prone, 14.9% REM sleep), with a decreased sleep efficiency at 75.4%.  Sleep latency was normal at 12.5 minutes.   Of the total sleep time, the percentage of stage N1 sleep was 11.8%, stage N2 sleep was 73.3%, stage N3 sleep was 0.0%, and REM sleep was 14.9%. There were 3 Stage R periods observed on  this study night, 35 awakenings (i.e. transitions to Stage W from any sleep stage), and 108.0 total stage transitions. Wake after sleep onset (WASO) time accounted for 91 minutes.  AROUSAL: There were 67 arousals in total, for an arousal index of 12.5 arousals/hour.  Of these, 10 were identified as respiratory-related arousals (1.9 /h), 8 were PLM-related arousals (1.5 /h), and 49 were non-specific arousals (9.2 /h)  RESPIRATORY MONITORING:  Based on CMS criteria (using a 4% oxygen desaturation rule for scoring hypopneas), there were 12 apneas (10 obstructive; 0 central; 2 mixed), and 2 hypopneas.  Apnea index was 2.3. Hypopnea index was 0.4. The apnea-hypopnea index was 2.6 overall (7.5 supine, 1.3 non-supine; 1.3 REM, 0.0 supine REM). There were 0 respiratory effort-related arousals (RERAs).  The RERA index was 0.0 events/h.   OXIMETRY: Total sleep time spent at, or below 88% was 0.1 minutes, or 0.0% of total sleep time. Snoring was classified as moderately loud. Respiratory events were associated with oxyhemoglobin desaturations  with a nadir during sleep 88% from a mean of 96%.   BODY POSITION: Duration of total sleep and percent of total sleep in their respective position is as follows: supine 103 minutes (32.5%), non-supine 214.5 minutes (67.5%); right 151 minutes (47.6%), left 63 minutes (19.8%), and prone 00 minutes (0.0%).  LIMB MOVEMENTS: There were 140 periodic limb movements of sleep (26.4/h), of which 8 (1.5/h) were associated with an arousal. ECG: The electrocardiogram documented NSR.  The average heart rate during sleep was 61 bpm.  The maximum heart rate during sleep was 70 bpm. The maximum heart rate during recording was 74.    IMPRESSION:  An Airfit F 40 FFM in medium size  was used, allowing titration to aBiPAP pressure from 15/ 10 cm water through 20/ 16 cm water.  AHI was 1.8/h by CMS criteria.   1. BILEVEL / ASV / AVAPS  Sleep-disordered breathing improved at the recommended  pressure of 20/16  cmH2O. Hypoxemia was resolved with therapy.     3. Frequent periodic limb movements (PLMs) of sleep were observed.            RECOMMENDATIONS: BiPAP at 20/ 16 cm water, ResMED AirFit F 40  (FFM) in medium size. Heated humidification. PLMs improved under BiPAP titration as well.     Melvyn Novas, MD               General Information  Name: Jeff Stewart, Jeff Stewart BMI: 29 Physician: ,   ID: [id number] Height: 73 in Technician: Domingo Cocking  Sex: Male Weight: 257 lb Record: [id number]  Age: 4 [1955/11/15] Date: 05/15/2023 Scorer: Domingo Cocking   Recommended Settings IPAP: N/A cmH20 EPAP: N/A cmH2O AHI: N/A AHI (4%): N/A   Pressure IPAP/EPAP 00 15 / 10 17 / 12 19 / 14 20 / 16   O2 Vol 0.0 0.0 0.0 0.0 0.0  Time TRT 0.22m 264.58m 23.41m 89.14m 45.28m   TST 0.19m 199.35m 15.8m 69.24m 33.67m  Sleep Stage % Wake 0.0 20.7 32.6 22.3 5.6   % REM 0.0 13.3 0.0 0.0 62.7   % N1 0.0 10.0 32.3 15.8 4.5   % N2 0.0 76.7 67.7 84.2 32.8   % N3 0.0 0.0 0.0 0.0 0.0  Respiratory Total Events 0 2 2 12 2    Obs. Apn. 0 0 1 9 0   Mixed Apn. 0 0 1 1 0   Cen. Apn. 0 0 0 0 0   Hypopneas 0 2 0 2 2   AHI 0.00 0.60 7.74 10.36 3.58   Supine AHI 0.00 5.00 7.74 11.25 0.00   Prone AHI 0.00 0.00 0.00 0.00 0.00   Side AHI 0.00 0.00 0.00 0.00 3.58  Respiratory (4%) Hypopneas (4%) 0.00 1.00 0.00 0.00 1.00   AHI (4%) 0.00 0.30 7.74 8.63 1.79   Supine AHI (4%) 0.00 2.50 7.74 9.38 0.00   Prone AHI (4%) 0.00 0.00 0.00 0.00 0.00   Side AHI (4%) 0.00 0.00 0.00 0.00 1.79  Desat Profile <= 90% 0.5m 0.9m 0.63m 3.78m 0.88m   <= 80% 0.40m 0.59m 0.77m 3.60m 0.20m   <= 70% 0.2m 0.35m 0.68m 3.46m 0.22m   <= 60% 0.56m 0.39m 0.53m 3.11m 0.28m  Arousal Index Apnea 0.0 0.0 0.0 6.9 0.0   Hypopnea 0.0 0.3 0.0 0.9 0.0   LM 0.0 3.9 3.9 9.5 5.4   Spontaneous 0.0 12.0 11.6 4.3 1.8  [facility] CPAP Summary    General Information  Name: Jeff Stewart, Jeff Stewart BMI: 33.91 Physician:  Melvyn Novas, MD  ID: [id number] Height: 73.0 in Technician: Domingo Cocking, RPSGT  Sex: Male Weight: 257.0 lb Record: [id number]  Age: 39 [May 15, 1955] Date: 05/15/2023     Medical & Medication History    NICANOR MENDOLIA is a 68 y.o. male patient who is seen upon referral on 10/06/2022 from Dr.Dettinger for a Sleep consultation. This patient has been dx with OSA many years ago but was never able to use an tolerate PAP therapy, had several nasal surgeries. He has not told  us before , but he has undergone a HST ( not mentioned in his referral ) and he has seen ENT for inspire - and was deemed "not a good candidate" after an upper airway endoscopy. He has in the past undergone a [place]PP[place] in [place] , Dr Lovett Calender. HST result was not available for Dr. Jenne Pane either. Patient doesn't know who did this test. Who referred for this test? None is mentioned in EPIC? Chief concern according to patient : I need to find a way to treat my apnea. The patient had the first sleep study in the year 2000 no results in EPIC none documented but he has pulmonary care, orthopedist is Dr Sharolyn Douglas, did his 5th and 6 th back surgery , had many neck surgeries. 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 Had Nissen fundoplication , this patient also has a morphine pump implanted, he has a spinal cord stimulator implanted which has been inactive for the last 2 or 3 years, he is s/p UPPP and 6 nasal plasties, he had a cervical anterior fusion  Tylenol, Zyrtec, Vitamin D, Flonase, Lasix, Hydrodiuril, Cozaar, Protonix, Zoloft, Depotestosterone Cypionate, Turmeric, Tessalon perles   Sleep Disorder      Comments   Patient arrived for a BiPAP titration polysomnogram. Procedure explained and all questions answered. Patient recently attempted an in lab BiPAP titration without success due to his excessive limb movements (patient chose to d/c that study). Patient was shown BiPAP at 15/10 cm/H2O with a s/m  Evora FFM and a medium AirFit F40 (patient chose the AirFit F40) at lights out time. Shortly after, mask was changed to the s/w Evora FFM due to leaks with the AirFit F40. BiPAP was started at 15/10 and remained there while patient slept in lateral position. pressure was increased to BiPAP 20/16 cmH2O, in an effort to control respiratory events and abolish snoring while supine. No obvious cardiac arrhythmias noted. Patient slept supine. PLMS observed. Patient had no restroom visit.    CPAP start time: 09:47:46 PM CPAP end time: 04:49:24 AM   Time Total Supine Side Prone Upright  Recording (TRT) 7h 1.92m 2h 48.9m 4h 13.22m 0h 0.59m 0h 0.62m  Sleep (TST) 5h 18.61m 1h 43.9m 3h 34.65m 0h 0.53m 0h 0.46m   Latency N1 N2 N3 REM Onset Per. Slp. Eff.  Actual 0h 0.52m 0h 22.43m 0h 0.88m 3h 35.39m 0h 12.38m 0h 34.37m 75.44%   Stg Dur Wake N1 N2 N3 REM  Total 103.5 37.5 233.0 0.0 47.5  Supine 65.0 22.5 81.0 0.0 0.0  Side 38.5 15.0 152.0 0.0 47.5  Prone 0.0 0.0 0.0 0.0 0.0  Upright 0.0 0.0 0.0 0.0 0.0   Stg % Wake N1 N2 N3 REM  Total 24.6 11.8 73.3 0.0 14.9  Supine 15.4 7.1 25.5 0.0 0.0  Side 9.1 4.7 47.8 0.0 14.9  Prone 0.0 0.0 0.0 0.0 0.0  Upright 0.0 0.0 0.0 0.0 0.0     Apnea Summary Sub Supine Side Prone Upright  Total 12 Total 12 12 0 0 0    REM 0 0 0 0 0    NREM 12 12 0 0 0  Obs 10 REM 0 0 0 0 0    NREM 10 10 0 0 0  Mix 2 REM 0 0 0 0 0    NREM 2 2 0 0 0  Cen 0 REM 0 0 0 0 0    NREM 0 0 0 0 0   Rera Summary  Sub Supine Side Prone Upright  Total 0 Total 0 0 0 0 0    REM 0 0 0 0 0    NREM 0 0 0 0 0   Hypopnea Summary Sub Supine Side Prone Upright  Total 6 Total 6 4 2  0 0    REM 2 0 2 0 0    NREM 4 4 0 0 0   4% Hypopnea Summary Sub Supine Side Prone Upright  Total (4%) 2 Total 2 1 1  0 0    REM 1 0 1 0 0    NREM 1 1 0 0 0     AHI Total Obs Mix Cen  3.40 Apnea 2.26 1.89 0.38 0.00   Hypopnea 1.13 -- -- --  2.64 Hypopnea (4%) 0.38 -- -- --    Total Supine Side Prone Upright  Position AHI  3.40 9.28 0.56 0.00 0.00  REM AHI 2.53   NREM AHI 3.55   Position RDI 3.40 9.28 0.56 0.00 0.00  REM RDI 2.53   NREM RDI 3.55    4% Hypopnea Total Supine Side Prone Upright  Position AHI (4%) 2.64 7.54 0.28 0.00 0.00  REM AHI (4%) 1.26   NREM AHI (4%) 2.88   Position RDI (4%) 2.64 7.54 0.28 0.00 0.00  REM RDI (4%) 1.26   NREM RDI (4%) 2.88    Desaturation Information  <100% <90% <80% <70% <60% <50% <40%  Supine 36 1 0 0 0 0 0  Side 15 0 0 0 0 0 0  Prone 0 0 0 0 0 0 0  Upright 0 0 0 0 0 0 0  Total 51 1 0 0 0 0 0  Desaturation threshold setting: 3% Minimum desaturation setting: 10 seconds SaO2 nadir: 88% The longest event was a 29 sec obstructive Apnea with a minimum SaO2 of 91%. The lowest SaO2 was 89% associated with a 23 sec obstructive Apnea. EKG Rates EKG Avg Max Min  Awake 64 75 58  Asleep 61 70 57  EKG Events: N/A Awakening/Arousal Information # of Awakenings 35  Wake after sleep onset 91.62m  Wake after persistent sleep 72.46m   Arousal Assoc. Arousals Index  Apneas 8 1.5  Hypopneas 2 0.4  Leg Movements 28 5.3  Snore 0.0 0.0  PTT Arousals 0 0.0  Spontaneous 49 9.2  Total 86 16.2  Myoclonus Information PLMS LMs Index  Total LMs during PLMS 140 26.4  LMs w/ Microarousals 8 1.5   LM LMs Index  w/ Microarousal 19 3.6  w/ Awakening 8 1.5  w/ Resp Event 0 0.0  Spontaneous 52 9.8  Total 71 13.4

## 2023-05-31 ENCOUNTER — Encounter: Payer: Self-pay | Admitting: Neurology

## 2023-05-31 DIAGNOSIS — M47816 Spondylosis without myelopathy or radiculopathy, lumbar region: Secondary | ICD-10-CM | POA: Diagnosis not present

## 2023-06-01 DIAGNOSIS — L578 Other skin changes due to chronic exposure to nonionizing radiation: Secondary | ICD-10-CM | POA: Diagnosis not present

## 2023-06-01 DIAGNOSIS — L538 Other specified erythematous conditions: Secondary | ICD-10-CM | POA: Diagnosis not present

## 2023-06-01 DIAGNOSIS — L821 Other seborrheic keratosis: Secondary | ICD-10-CM | POA: Diagnosis not present

## 2023-06-01 DIAGNOSIS — L820 Inflamed seborrheic keratosis: Secondary | ICD-10-CM | POA: Diagnosis not present

## 2023-06-01 DIAGNOSIS — L718 Other rosacea: Secondary | ICD-10-CM | POA: Diagnosis not present

## 2023-06-01 DIAGNOSIS — L814 Other melanin hyperpigmentation: Secondary | ICD-10-CM | POA: Diagnosis not present

## 2023-06-01 DIAGNOSIS — L570 Actinic keratosis: Secondary | ICD-10-CM | POA: Diagnosis not present

## 2023-06-07 DIAGNOSIS — M1712 Unilateral primary osteoarthritis, left knee: Secondary | ICD-10-CM | POA: Diagnosis not present

## 2023-06-08 DIAGNOSIS — M961 Postlaminectomy syndrome, not elsewhere classified: Secondary | ICD-10-CM | POA: Diagnosis not present

## 2023-06-08 DIAGNOSIS — M4802 Spinal stenosis, cervical region: Secondary | ICD-10-CM | POA: Diagnosis not present

## 2023-06-08 DIAGNOSIS — Z981 Arthrodesis status: Secondary | ICD-10-CM | POA: Diagnosis not present

## 2023-06-08 DIAGNOSIS — M48062 Spinal stenosis, lumbar region with neurogenic claudication: Secondary | ICD-10-CM | POA: Diagnosis not present

## 2023-06-14 DIAGNOSIS — M51362 Other intervertebral disc degeneration, lumbar region with discogenic back pain and lower extremity pain: Secondary | ICD-10-CM | POA: Diagnosis not present

## 2023-06-14 DIAGNOSIS — M961 Postlaminectomy syndrome, not elsewhere classified: Secondary | ICD-10-CM | POA: Diagnosis not present

## 2023-06-14 DIAGNOSIS — R200 Anesthesia of skin: Secondary | ICD-10-CM | POA: Diagnosis not present

## 2023-06-20 DIAGNOSIS — M4802 Spinal stenosis, cervical region: Secondary | ICD-10-CM | POA: Insufficient documentation

## 2023-06-29 DIAGNOSIS — G2581 Restless legs syndrome: Secondary | ICD-10-CM | POA: Diagnosis not present

## 2023-06-29 DIAGNOSIS — M79605 Pain in left leg: Secondary | ICD-10-CM | POA: Diagnosis not present

## 2023-06-29 DIAGNOSIS — M79604 Pain in right leg: Secondary | ICD-10-CM | POA: Diagnosis not present

## 2023-06-30 DIAGNOSIS — M50022 Cervical disc disorder at C5-C6 level with myelopathy: Secondary | ICD-10-CM | POA: Diagnosis not present

## 2023-06-30 DIAGNOSIS — M4802 Spinal stenosis, cervical region: Secondary | ICD-10-CM | POA: Diagnosis not present

## 2023-07-06 ENCOUNTER — Other Ambulatory Visit: Payer: Self-pay | Admitting: Family Medicine

## 2023-07-06 DIAGNOSIS — I10 Essential (primary) hypertension: Secondary | ICD-10-CM

## 2023-07-10 DIAGNOSIS — M961 Postlaminectomy syndrome, not elsewhere classified: Secondary | ICD-10-CM | POA: Diagnosis not present

## 2023-07-10 DIAGNOSIS — G894 Chronic pain syndrome: Secondary | ICD-10-CM | POA: Diagnosis not present

## 2023-07-10 DIAGNOSIS — M47816 Spondylosis without myelopathy or radiculopathy, lumbar region: Secondary | ICD-10-CM | POA: Diagnosis not present

## 2023-07-10 DIAGNOSIS — R001 Bradycardia, unspecified: Secondary | ICD-10-CM | POA: Diagnosis not present

## 2023-07-18 DIAGNOSIS — Z79899 Other long term (current) drug therapy: Secondary | ICD-10-CM | POA: Diagnosis not present

## 2023-07-18 DIAGNOSIS — M4712 Other spondylosis with myelopathy, cervical region: Secondary | ICD-10-CM | POA: Diagnosis not present

## 2023-07-18 DIAGNOSIS — G4733 Obstructive sleep apnea (adult) (pediatric): Secondary | ICD-10-CM | POA: Diagnosis not present

## 2023-07-18 DIAGNOSIS — M4722 Other spondylosis with radiculopathy, cervical region: Secondary | ICD-10-CM | POA: Diagnosis not present

## 2023-07-18 DIAGNOSIS — M4802 Spinal stenosis, cervical region: Secondary | ICD-10-CM | POA: Diagnosis not present

## 2023-07-18 DIAGNOSIS — Z981 Arthrodesis status: Secondary | ICD-10-CM | POA: Diagnosis not present

## 2023-07-18 DIAGNOSIS — M50022 Cervical disc disorder at C5-C6 level with myelopathy: Secondary | ICD-10-CM | POA: Diagnosis not present

## 2023-07-18 HISTORY — PX: OTHER SURGICAL HISTORY: SHX169

## 2023-07-19 DIAGNOSIS — M4722 Other spondylosis with radiculopathy, cervical region: Secondary | ICD-10-CM | POA: Diagnosis not present

## 2023-07-19 DIAGNOSIS — M4712 Other spondylosis with myelopathy, cervical region: Secondary | ICD-10-CM | POA: Diagnosis not present

## 2023-07-19 DIAGNOSIS — R269 Unspecified abnormalities of gait and mobility: Secondary | ICD-10-CM | POA: Insufficient documentation

## 2023-07-19 DIAGNOSIS — G4733 Obstructive sleep apnea (adult) (pediatric): Secondary | ICD-10-CM | POA: Diagnosis not present

## 2023-07-19 DIAGNOSIS — Z981 Arthrodesis status: Secondary | ICD-10-CM | POA: Diagnosis not present

## 2023-07-19 DIAGNOSIS — Z9889 Other specified postprocedural states: Secondary | ICD-10-CM | POA: Diagnosis not present

## 2023-07-19 DIAGNOSIS — M4802 Spinal stenosis, cervical region: Secondary | ICD-10-CM | POA: Diagnosis not present

## 2023-07-19 DIAGNOSIS — M50022 Cervical disc disorder at C5-C6 level with myelopathy: Secondary | ICD-10-CM | POA: Diagnosis not present

## 2023-07-19 DIAGNOSIS — Z79899 Other long term (current) drug therapy: Secondary | ICD-10-CM | POA: Diagnosis not present

## 2023-08-07 ENCOUNTER — Ambulatory Visit: Payer: Medicare Other

## 2023-08-07 VITALS — BP 120/80 | HR 80 | Ht 73.0 in | Wt 255.0 lb

## 2023-08-07 DIAGNOSIS — Z0000 Encounter for general adult medical examination without abnormal findings: Secondary | ICD-10-CM

## 2023-08-07 DIAGNOSIS — M5030 Other cervical disc degeneration, unspecified cervical region: Secondary | ICD-10-CM | POA: Insufficient documentation

## 2023-08-07 DIAGNOSIS — M51369 Other intervertebral disc degeneration, lumbar region without mention of lumbar back pain or lower extremity pain: Secondary | ICD-10-CM | POA: Insufficient documentation

## 2023-08-07 DIAGNOSIS — M961 Postlaminectomy syndrome, not elsewhere classified: Secondary | ICD-10-CM | POA: Insufficient documentation

## 2023-08-07 DIAGNOSIS — M542 Cervicalgia: Secondary | ICD-10-CM | POA: Insufficient documentation

## 2023-08-07 NOTE — Progress Notes (Signed)
 Subjective:   Jeff Stewart is a 68 y.o. who presents for a Medicare Wellness preventive visit.  As a reminder, Annual Wellness Visits don't include a physical exam, and some assessments may be limited, especially if this visit is performed virtually. We may recommend an in-person follow-up visit with your provider if needed.  Visit Complete: Virtual I connected with  Jeff Stewart on 08/07/23 by a audio enabled telemedicine application and verified that I am speaking with the correct person using two identifiers.  Patient Location: Home  Provider Location: Home Office  I discussed the limitations of evaluation and management by telemedicine. The patient expressed understanding and agreed to proceed.  Vital Signs: Because this visit was a virtual/telehealth visit, some criteria may be missing or patient reported. Any vitals not documented were not able to be obtained and vitals that have been documented are patient reported.  VideoDeclined- This patient declined Librarian, academic. Therefore the visit was completed with audio only.  Persons Participating in Visit: Patient.  AWV Questionnaire: No: Patient Medicare AWV questionnaire was not completed prior to this visit.  Cardiac Risk Factors include: advanced age (>66men, >40 women);male gender;hypertension;obesity (BMI >30kg/m2)     Objective:     Today's Vitals   08/07/23 0825  BP: 120/80  Pulse: 80  Weight: 255 lb (115.7 kg)  Height: 6\' 1"  (1.854 m)   Body mass index is 33.64 kg/m.     08/07/2023    8:11 AM 05/05/2023   11:56 PM 03/20/2023    3:18 PM 08/04/2022    8:23 AM 07/22/2021    3:45 PM 07/07/2020    2:33 PM 05/09/2018    1:39 PM  Advanced Directives  Does Patient Have a Medical Advance Directive? No No No Yes Yes No No  Type of Aeronautical engineer of [place];Living will Healthcare Power of [place];Living will    Copy of Healthcare Power of Attorney in Chart?     No - copy requested No - copy requested    Would patient like information on creating a medical advance directive?  No - Patient declined    No - Patient declined     Current Medications (verified) Outpatient Encounter Medications as of 08/07/2023  Medication Sig   acetaminophen  (TYLENOL ) 500 MG tablet Take 500 mg by mouth every 6 (six) hours as needed.   Cholecalciferol (VITAMIN D ) 50 MCG (2000 UT) CAPS Take 2,000 Units by mouth daily with lunch.   dipyridamole -aspirin  (AGGRENOX ) 200-25 MG 12hr capsule Take 1 capsule by mouth 2 (two) times daily.   fluticasone  (FLONASE ) 50 MCG/ACT nasal spray Place 2 sprays into both nostrils daily.   furosemide  (LASIX ) 40 MG tablet Take 1 tablet (40 mg total) by mouth every morning.   hydrochlorothiazide  (HYDRODIURIL ) 25 MG tablet Take 1 tablet (25 mg total) by mouth daily. TAKE 1 TABLET DAILY.   losartan  (COZAAR ) 50 MG tablet TAKE ONE TABLET TWICE DAILY   pantoprazole  (PROTONIX ) 40 MG tablet Take 1 tablet (40 mg total) by mouth daily.   sertraline  (ZOLOFT ) 100 MG tablet Take 1.5 tablets (150 mg total) by mouth daily.   Syringe/Needle, Disp, (SYRINGE 3CC/21GX1-1/4") 21G X 1-1/4" 3 ML MISC 1 each by Does not apply route once a week.   testosterone  cypionate (DEPOTESTOSTERONE CYPIONATE) 200 MG/ML injection INJECT 0.5 MLS EVERY 10 DAYS.   Turmeric 500 MG CAPS Take by mouth.   potassium chloride  SA (KLOR-CON  M) 20 MEQ tablet Take 2 tablets (40  mEq total) by mouth 2 (two) times daily for 7 days.   No facility-administered encounter medications on file as of 08/07/2023.    Allergies (verified) Nalbuphine and Nubain [nalbuphine hcl]   History: Past Medical History:  Diagnosis Date   Anxiety    Carpal tunnel syndrome, bilateral    Cataract    removed years ago   Chronic back pain    internal morphine pump   DDD (degenerative disc disease)    neck, lumbar   Depression    Dyslipidemia    diet controlled   GERD (gastroesophageal reflux disease)     past hx- had nissen fundiplication    Hypertension    borderline   Sleep apnea    wears C-PAP   Status post insertion of spinal cord stimulator    Past Surgical History:  Procedure Laterality Date   BACK SURGERY     x 6   CARPAL TUNNEL RELEASE     bilateral   COLONOSCOPY     ELBOW SURGERY     INGUINAL HERNIA REPAIR Right    INTERNAL MORPHINE PUMP      KNEE ARTHROSCOPY     NASAL SEPTUM SURGERY     x 6   neck fusion   07/18/2023   neck surgery pe rpt   NISSEN FUNDOPLICATION     SKIN CANCER EXCISION     SPINAL CORD STIMULATOR INSERTION     SPINAL CORD STIMULATOR REMOVAL  02/2023   STOMACH SURGERY     Nissen Fundiplication   thumb surgery     TOTAL KNEE ARTHROPLASTY Left 04/24/2018   Procedure: TOTAL KNEE ARTHROPLASTY;  Surgeon: Saundra Curl, MD;  Location: [facility];  Service: Orthopedics;  Laterality: Left;   UPPER GASTROINTESTINAL ENDOSCOPY     Family History  Problem Relation Age of Onset   CAD Father 22   Emphysema Father    Stroke Mother 85   CAD Brother 24       CABG   Alcohol abuse Brother    Hypertension Brother    Stroke Brother    CAD Brother    Cirrhosis Brother    Alcohol abuse Brother    Hypertension Brother    CAD Sister    Hypertension Sister    Hyperlipidemia Sister    Cancer Sister        melanoma   Cancer Sister    Lung cancer Sister    Cancer Brother    Lung cancer Brother    Cancer Brother    Lung cancer Brother    Colon cancer Neg Hx    Colon polyps Neg Hx    Esophageal cancer Neg Hx    Rectal cancer Neg Hx    Stomach cancer Neg Hx    Social History   Socioeconomic History   Marital status: Married    Spouse name: Not on file   Number of children: 3   Years of education: Not on file   Highest education level: Not on file  Occupational History   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  Tobacco Use   Smoking status: Never   Smokeless tobacco: Never  Vaping Use   Vaping status: Never Used  Substance and Sexual  Activity   Alcohol use: No   Drug use: No   Sexual activity: Not Currently    Birth control/protection: Post-menopausal    Comment: married for 1972  Other Topics Concern   Not on file  Social History Narrative  Lives with wife    Children live nearby   Retired    Social Drivers of [employer]: Low Risk  (08/07/2023)   Overall Financial Resource Strain (CARDIA)    Difficulty of Paying Living Expenses: Not hard at all  Food Insecurity: No Food Insecurity (08/07/2023)   Hunger Vital Sign    Worried About Running Out of Food in the Last Year: Never true    Ran Out of Food in the Last Year: Never true  Transportation Needs: No Transportation Needs (08/07/2023)   PRAPARE - Administrator, Civil Service (Medical): No    Lack of Transportation (Non-Medical): No  Physical Activity: Unknown (08/07/2023)   Exercise Vital Sign    Days of Exercise per Week: 0 days    Minutes of Exercise per Session: Not on file  Stress: No Stress Concern Present (08/07/2023)   [employer] of Occupational Health - Occupational Stress Questionnaire    Feeling of Stress : Only a little  Social Connections: Moderately Isolated (08/07/2023)   Social Connection and Isolation Panel [NHANES]    Frequency of Communication with Friends and Family: Once a week    Frequency of Social Gatherings with Friends and Family: Once a week    Attends Religious Services: More than 4 times per year    Active Member of [employer] or Organizations: No    Attends Engineer, structural: Never    Marital Status: Married    Tobacco Counseling Counseling given: Yes    Clinical Intake:  Pre-visit preparation completed: Yes  Pain : No/denies pain     BMI - recorded: 33.64 Nutritional Status: BMI > 30  Obese Nutritional Risks: None Diabetes: No  No results found for: "HGBA1C"   How often do you need to have someone help you when you read instructions, pamphlets, or other  written materials from your doctor or pharmacy?: 1 - Never  Interpreter Needed?: No  Information entered by :: Alia T/cma   Activities of Daily Living     08/07/2023    8:09 AM  In your present state of health, do you have any difficulty performing the following activities:  Hearing? 1  Vision? 1  Comment pt wear reading glasses/pt goes to [facility] [address] in [facility]  Difficulty concentrating or making decisions? 0  Walking or climbing stairs? 0  Dressing or bathing? 0  Doing errands, shopping? 0  Preparing Food and eating ? N  Using the Toilet? N  In the past six months, have you accidently leaked urine? N  Do you have problems with loss of bowel control? N  Managing your Medications? N  Managing your Finances? N  Housekeeping or managing your Housekeeping? N    Patient Care Team: Dettinger, Lucio Sabin, MD as PCP - General (Family Medicine) Sandralee Crow, MD as Referring Physician (Pain Medicine) Augustin Bloch, OD (Optometry) Arvil Birks, MD as Consulting Physician (Orthopedic Surgery) Specialists, [facility] (Orthopedic Surgery) Asencion Blacksmith, MD (Inactive) as Consulting Physician (Gastroenterology) Baby Bolt, MD as Consulting Physician (Endocrinology)  Indicate any recent Medical Services you may have received from other than [facility] providers in the past year (date may be approximate).     Assessment:    This is a routine wellness examination for [place].  Hearing/Vision screen Hearing Screening - Comments:: Pt stated have hearing dif Vision Screening - Comments:: pt wear reading glasses/pt goes to [facility] [address] [facility]   Goals Addressed  This Visit's Progress    Increase physical activity   On track    Try to walk every day and drink 3 bottles of water  daily       Depression Screen     08/07/2023    8:15 AM 04/19/2023   10:47 AM 03/27/2023    1:01 PM 02/13/2023   10:26 AM 08/31/2022    1:16 PM 08/04/2022     8:22 AM 05/10/2022   12:17 PM  PHQ 2/9 Scores  PHQ - 2 Score 3 1 6 3 3  0 2  PHQ- 9 Score 5 8 16 10 9  11     Fall Risk     08/07/2023    8:11 AM 03/27/2023    1:01 PM 02/13/2023   10:26 AM 08/31/2022    1:16 PM 08/04/2022    8:20 AM  Fall Risk   Falls in the past year? 0 0 0 0 0  Number falls in past yr: 0    0  Injury with Fall? 0    0  Risk for fall due to : No Fall Risks    No Fall Risks  Follow up Falls prevention discussed;Falls evaluation completed    Falls prevention discussed    MEDICARE RISK AT HOME:  Medicare Risk at Home Any stairs in or around the home?: Yes If so, are there any without handrails?: Yes Home free of loose throw rugs in walkways, pet beds, electrical cords, etc?: Yes Adequate lighting in your home to reduce risk of falls?: Yes Life alert?: No Use of a cane, walker or w/c?: No Grab bars in the bathroom?: Yes Shower chair or bench in shower?: Yes Elevated toilet seat or a handicapped toilet?: Yes  TIMED UP AND GO:  Was the test performed?  no  Cognitive Function: 6CIT completed    11/22/2016    8:39 AM  MMSE - Mini Mental State Exam  Orientation to time 5  Orientation to Place 5  Registration 3  Attention/ Calculation 5  Recall 2  Language- name 2 objects 2  Language- repeat 1  Language- follow 3 step command 3  Language- read & follow direction 1  Write a sentence 1  Copy design 1  Total score 29        08/07/2023    8:17 AM 08/04/2022    8:23 AM 07/22/2021    3:43 PM 07/07/2020    2:34 PM  6CIT Screen  What Year? 0 points 0 points 0 points 0 points  What month? 0 points 0 points 0 points 0 points  What time? 0 points 0 points 0 points 0 points  Count back from 20 0 points 0 points 0 points 0 points  Months in reverse 0 points 0 points 0 points 0 points  Repeat phrase 0 points 0 points 2 points 0 points  Total Score 0 points 0 points 2 points 0 points    Immunizations Immunization History  Administered Date(s) Administered    Fluad Quad(high Dose 65+) 12/21/2018, 12/22/2020, 01/07/2022   Fluad Trivalent(High Dose 65+) 02/13/2023   Influenza Split 01/19/2014   Influenza,inj,Quad PF,6+ Mos 01/14/2015, 02/02/2016, 02/08/2018   Influenza-Unspecified 02/19/2014, 02/24/2020   Moderna Sars-Covid-2 Vaccination 05/12/2019, 05/23/2019, 03/10/2020   Pneumococcal Conjugate-13 08/09/2019   Pneumococcal Polysaccharide-23 04/23/2020   Tdap 12/11/2014   Zoster Recombinant(Shingrix) 04/23/2020, 10/08/2020    Screening Tests Health Maintenance  Topic Date Due   COVID-19 Vaccine (4 - 2024-25 season) 08/22/2024 (Originally 11/20/2022)   INFLUENZA VACCINE  10/20/2023  Medicare Annual Wellness (AWV)  08/06/2024   DTaP/Tdap/Td (2 - Td or Tdap) 12/10/2024   Colonoscopy  01/07/2029   Pneumonia Vaccine 58+ Years old  Completed   Hepatitis C Screening  Completed   Zoster Vaccines- Shingrix  Completed   HPV VACCINES  Aged Out   Meningococcal B Vaccine  Aged Out    Health Maintenance  There are no preventive care reminders to display for this patient.  Health Maintenance Items Addressed: See Nurse Notes  Additional Screening:  Vision Screening: Recommended annual ophthalmology exams for early detection of glaucoma and other disorders of the eye.  Dental Screening: Recommended annual dental exams for proper oral hygiene  Community Resource Referral / Chronic Care Management: CRR required this visit?  No   CCM required this visit?  No   Plan:    I have personally reviewed and noted the following in the patient's chart:   Medical and social history Use of alcohol, tobacco or illicit drugs  Current medications and supplements including opioid prescriptions. Patient is not currently taking opioid prescriptions. Functional ability and status Nutritional status Physical activity Advanced directives List of other physicians Hospitalizations, surgeries, and ER visits in previous 12 months Vitals Screenings to  include cognitive, depression, and falls Referrals and appointments  In addition, I have reviewed and discussed with patient certain preventive protocols, quality metrics, and best practice recommendations. A written personalized care plan for preventive services as well as general preventive health recommendations were provided to patient.   Michaelle Adolphus, CMA   08/07/2023   After Visit Summary: (MyChart) Due to this being a telephonic visit, the after visit summary with patients personalized plan was offered to patient via MyChart   Notes: Nothing significant to report at this time.

## 2023-08-07 NOTE — Patient Instructions (Signed)
 [name] , Thank you for taking time out of your busy schedule to complete your Annual Wellness Visit with me. I enjoyed our conversation and look forward to speaking with you again next year. I, as well as your care team,  appreciate your ongoing commitment to your health goals. Please review the following plan we discussed and let me know if I can assist you in the future. Your Game plan/ To Do List   Follow up Visits: Next Medicare AWV with our clinical staff: Wed., 5/20/5 at 8:00a.m.    Next Office Visit with your provider: 10/18/23 at 9:25a.m.  Clinician Recommendations:  Aim for 30 minutes of exercise or brisk walking, 6-8 glasses of water , and 5 servings of fruits and vegetables each day. N/a      This is a list of the screening recommended for you and due dates:  Health Maintenance  Topic Date Due   COVID-19 Vaccine (4 - 2024-25 season) 08/22/2024*   Flu Shot  10/20/2023   Medicare Annual Wellness Visit  08/06/2024   DTaP/Tdap/Td vaccine (2 - Td or Tdap) 12/10/2024   Colon Cancer Screening  01/07/2029   Pneumonia Vaccine  Completed   Hepatitis C Screening  Completed   Zoster (Shingles) Vaccine  Completed   HPV Vaccine  Aged Out   Meningitis B Vaccine  Aged Out  *Topic was postponed. The date shown is not the original due date.    Advanced directives: (Declined) Advance directive discussed with you today. Even though you declined this today, please call our office should you change your mind, and we can give you the proper paperwork for you to fill out. Advance Care Planning is important because it:  [x]  Makes sure you receive the medical care that is consistent with your values, goals, and preferences  [x]  It provides guidance to your family and loved ones and reduces their decisional burden about whether or not they are making the right decisions based on your wishes.  Follow the link provided in your after visit summary or read over the paperwork we have mailed to you to  help you started getting your Advance Directives in place. If you need assistance in completing these, please reach out to us  so that we can help you!  See attachments for Preventive Care and Fall Prevention Tips.

## 2023-08-17 DIAGNOSIS — M542 Cervicalgia: Secondary | ICD-10-CM | POA: Diagnosis not present

## 2023-09-05 DIAGNOSIS — M5442 Lumbago with sciatica, left side: Secondary | ICD-10-CM | POA: Diagnosis not present

## 2023-09-05 DIAGNOSIS — M5441 Lumbago with sciatica, right side: Secondary | ICD-10-CM | POA: Diagnosis not present

## 2023-09-05 DIAGNOSIS — Z79899 Other long term (current) drug therapy: Secondary | ICD-10-CM | POA: Diagnosis not present

## 2023-09-05 DIAGNOSIS — Z978 Presence of other specified devices: Secondary | ICD-10-CM | POA: Diagnosis not present

## 2023-09-05 DIAGNOSIS — Z5181 Encounter for therapeutic drug level monitoring: Secondary | ICD-10-CM | POA: Diagnosis not present

## 2023-09-11 ENCOUNTER — Other Ambulatory Visit: Payer: Self-pay | Admitting: Family Medicine

## 2023-09-11 DIAGNOSIS — K219 Gastro-esophageal reflux disease without esophagitis: Secondary | ICD-10-CM

## 2023-09-20 ENCOUNTER — Other Ambulatory Visit

## 2023-09-20 DIAGNOSIS — M549 Dorsalgia, unspecified: Secondary | ICD-10-CM | POA: Diagnosis not present

## 2023-09-20 DIAGNOSIS — I10 Essential (primary) hypertension: Secondary | ICD-10-CM | POA: Diagnosis not present

## 2023-09-20 DIAGNOSIS — K219 Gastro-esophageal reflux disease without esophagitis: Secondary | ICD-10-CM | POA: Diagnosis not present

## 2023-09-25 ENCOUNTER — Encounter: Payer: Self-pay | Admitting: "Endocrinology

## 2023-09-25 ENCOUNTER — Ambulatory Visit: Admitting: "Endocrinology

## 2023-09-25 VITALS — BP 126/74 | HR 68 | Ht 73.0 in | Wt 252.0 lb

## 2023-09-25 DIAGNOSIS — E291 Testicular hypofunction: Secondary | ICD-10-CM | POA: Diagnosis not present

## 2023-09-25 DIAGNOSIS — E559 Vitamin D deficiency, unspecified: Secondary | ICD-10-CM | POA: Diagnosis not present

## 2023-09-25 LAB — POCT GLYCOSYLATED HEMOGLOBIN (HGB A1C): HbA1c, POC (controlled diabetic range): 5.5 % (ref 0.0–7.0)

## 2023-09-25 MED ORDER — TESTOSTERONE CYPIONATE 200 MG/ML IM SOLN: INTRAMUSCULAR | 0 refills | Status: DC

## 2023-09-25 NOTE — Progress Notes (Signed)
 09/25/2023                   Endocrinology follow-up note  Subjective:    Patient ID: Jeff Stewart, male    DOB: [birth year], PCP Dettinger, Fonda LABOR, MD   Past Medical History:  Diagnosis Date   Anxiety    Carpal tunnel syndrome, bilateral    Cataract    removed years ago   Chronic back pain    internal morphine pump   DDD (degenerative disc disease)    neck, lumbar   Depression    Dyslipidemia    diet controlled   GERD (gastroesophageal reflux disease)    past hx- had nissen fundiplication    Hypertension    borderline   Sleep apnea    wears C-PAP   Status post insertion of spinal cord stimulator    Past Surgical History:  Procedure Laterality Date   BACK SURGERY     x 6   CARPAL TUNNEL RELEASE     bilateral   COLONOSCOPY     ELBOW SURGERY     INGUINAL HERNIA REPAIR Right    INTERNAL MORPHINE PUMP      KNEE ARTHROSCOPY     NASAL SEPTUM SURGERY     x 6   neck fusion   07/18/2023   neck surgery pe rpt   NISSEN FUNDOPLICATION     SKIN CANCER EXCISION     SPINAL CORD STIMULATOR INSERTION     SPINAL CORD STIMULATOR REMOVAL  02/2023   STOMACH SURGERY     Nissen Fundiplication   thumb surgery     TOTAL KNEE ARTHROPLASTY Left 04/24/2018   Procedure: TOTAL KNEE ARTHROPLASTY;  Surgeon: Beverley Evalene BIRCH, MD;  Location: [facility];  Service: Orthopedics;  Laterality: Left;   UPPER GASTROINTESTINAL ENDOSCOPY     Social History   Socioeconomic History   Marital status: Married    Spouse name: Not on file   Number of children: 3   Years of education: Not on file   Highest education level: Not on file  Occupational History   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  Tobacco Use   Smoking status: Never   Smokeless tobacco: Never  Vaping Use   Vaping status: Never Used  Substance and Sexual Activity   Alcohol use: No   Drug use: No   Sexual activity: Not Currently    Birth control/protection: Post-menopausal    Comment: married for 1972  Other  Topics Concern   Not on file  Social History Narrative   Lives with wife    Children live nearby   Retired    Social Drivers of Corporate investment banker Strain: Low Risk  (08/17/2023)   Received from [facility]   Overall Financial Resource Strain (CARDIA)    Difficulty of Paying Living Expenses: Not hard at all  Food Insecurity: No Food Insecurity (08/17/2023)   Received from [facility]   Hunger Vital Sign    Within the past 12 months, you worried that your food would run out before you got the money to buy more.: Never true    Within the past 12 months, the food you bought just didn't last and you didn't have money to get more.: Never true  Transportation Needs: No Transportation Needs (08/17/2023)   Received from [facility] - Transportation    Lack of Transportation (Medical): No    Lack of Transportation (Non-Medical): No  Physical Activity:  Unknown (08/07/2023)   Exercise Vital Sign    Days of Exercise per Week: 0 days    Minutes of Exercise per Session: Not on file  Stress: No Stress Concern Present (08/07/2023)   [employer] of Occupational Health - Occupational Stress Questionnaire    Feeling of Stress : Only a little  Social Connections: Moderately Isolated (08/07/2023)   Social Connection and Isolation Panel    Frequency of Communication with Friends and Family: Once a week    Frequency of Social Gatherings with Friends and Family: Once a week    Attends Religious Services: More than 4 times per year    Active Member of [employer] or Organizations: No    Attends Banker Meetings: Never    Marital Status: Married   Outpatient Encounter Medications as of 09/25/2023  Medication Sig   acetaminophen  (TYLENOL ) 500 MG tablet Take 500 mg by mouth every 6 (six) hours as needed.   Cholecalciferol (VITAMIN D ) 50 MCG (2000 UT) CAPS Take 2,000 Units by mouth daily with lunch.   dipyridamole -aspirin  (AGGRENOX ) 200-25 MG 12hr capsule Take 1 capsule by  mouth 2 (two) times daily.   fluticasone  (FLONASE ) 50 MCG/ACT nasal spray Place 2 sprays into both nostrils daily.   furosemide  (LASIX ) 40 MG tablet Take 1 tablet (40 mg total) by mouth every morning.   hydrochlorothiazide  (HYDRODIURIL ) 25 MG tablet Take 1 tablet (25 mg total) by mouth daily. TAKE 1 TABLET DAILY.   losartan  (COZAAR ) 50 MG tablet TAKE ONE TABLET TWICE DAILY   pantoprazole  (PROTONIX ) 40 MG tablet TAKE 1 TABLET DAILY   potassium chloride  SA (KLOR-CON  M) 20 MEQ tablet Take 2 tablets (40 mEq total) by mouth 2 (two) times daily for 7 days.   sertraline  (ZOLOFT ) 100 MG tablet Take 1.5 tablets (150 mg total) by mouth daily.   Syringe/Needle, Disp, (SYRINGE 3CC/21GX1-1/4) 21G X 1-1/4 3 ML MISC 1 each by Does not apply route once a week.   testosterone  cypionate (DEPOTESTOSTERONE CYPIONATE) 200 MG/ML injection INJECT 0.5 MLS EVERY 10 DAYS.   Turmeric 500 MG CAPS Take by mouth.   [DISCONTINUED] testosterone  cypionate (DEPOTESTOSTERONE CYPIONATE) 200 MG/ML injection INJECT 0.5 MLS EVERY 10 DAYS.   No facility-administered encounter medications on file as of 09/25/2023.   ALLERGIES: Allergies  Allergen Reactions   Nalbuphine Nausea And Vomiting, Rash and Shortness Of Breath   Nubain [Nalbuphine Hcl] Rash   VACCINATION STATUS: Immunization History  Administered Date(s) Administered   Fluad Quad(high Dose 65+) 12/21/2018, 12/22/2020, 01/07/2022   Fluad Trivalent(High Dose 65+) 02/13/2023   Influenza Split 01/19/2014   Influenza,inj,Quad PF,6+ Mos 01/14/2015, 02/02/2016, 02/08/2018   Influenza-Unspecified 02/19/2014, 02/24/2020   Moderna Sars-Covid-2 Vaccination 05/12/2019, 05/23/2019, 03/10/2020   Pneumococcal Conjugate-13 08/09/2019   Pneumococcal Polysaccharide-23 04/23/2020   Tdap 12/11/2014   Zoster Recombinant(Shingrix) 04/23/2020, 10/08/2020    HPI [name] is a 68 year old gentleman with a medical history as above.  He is being seen in follow-up for hypogonadism on  testosterone  replacement therapy.   -He remains on testosterone  cypionate .  He is currently on testosterone  100 mg IM every 10 days.  His previsit labs show total testosterone  drawn into 97 from 9 797 during his last visit.  His PSA is improving and down to 1 ng per ML.    -Currently complaining of intermittent nausea, diarrhea, and fatigue.  He was recently found to have mild hypokalemia for which she is on potassium supplement.  He was known to have low testosterone  for at least  since age 7.   No evidence of adverse effects from testosterone  treatment.  He wishes to be continued on testosterone  placement therapy. He fathers 3 grown children. He denies history of head injury , has had nasal septal surgery multiple times. He denies injury to the testicles, exposure to chemotherapy, exposure to radiation to the genitals. However, due to chronic back injury and surgery he has exposure to heavy opioid therapy for pain control. He is currently on morphine pump. He has been off and  on  opioids for pain control for the last 10-15 years.  He struggles with sleep apnea, even changing CPAP machines did not help much.  Review of Systems Limited as above.  Objective:    BP 126/74   Pulse 68   Ht 6' 1 (1.854 m)   Wt 252 lb (114.3 kg)   BMI 33.25 kg/m   Wt Readings from Last 3 Encounters:  09/25/23 252 lb (114.3 kg)  08/07/23 255 lb (115.7 kg)  05/26/23 255 lb 6.4 oz (115.8 kg)      From previous exam.  Genitourinary system: He has bilaterally shrunk testicles, right testes 12 mL, left testes 15 mL. No scrotal mass, no varicocele,  no inguinal lymphadenopathy.    Recent Results (from the past 2160 hours)  Testosterone , Free, Total, SHBG     Status: Abnormal (Preliminary result)   Collection Time: 09/20/23  9:19 AM  Result Value Ref Range   Testosterone  91 (L) 264 - 916 ng/dL    Comment: Adult male reference interval is based on a population of healthy nonobese males (BMI <30) between  54 and 4 years old. Travison, et.al. JCEM [phone]. PMID: 71675896.    Testosterone , Free WILL FOLLOW    Sex Hormone Binding 25.6 19.3 - 76.4 nmol/L  PSA     Status: None   Collection Time: 09/20/23  9:19 AM  Result Value Ref Range   Prostate Specific Ag, Serum 1.0 0.0 - 4.0 ng/mL    Comment: Roche ECLIA methodology. According to the American Urological Association, Serum PSA should decrease and remain at undetectable levels after radical prostatectomy. The AUA defines biochemical recurrence as an initial PSA value 0.2 ng/mL or greater followed by a subsequent confirmatory PSA value 0.2 ng/mL or greater. Values obtained with different assay methods or kits cannot be used interchangeably. Results cannot be interpreted as absolute evidence of the presence or absence of malignant disease.   Lipid panel     Status: Abnormal   Collection Time: 09/20/23  9:19 AM  Result Value Ref Range   Cholesterol, Total 170 100 - 199 mg/dL   Triglycerides 84 0 - 149 mg/dL   HDL 50 >60 mg/dL   VLDL Cholesterol Cal 16 5 - 40 mg/dL   LDL Chol Calc (NIH) 895 (H) 0 - 99 mg/dL   Chol/HDL Ratio 3.4 0.0 - 5.0 ratio    Comment:                                   T. Chol/HDL Ratio                                             Men  Women  1/2 Avg.Risk  3.4    3.3                                   Avg.Risk  5.0    4.4                                2X Avg.Risk  9.6    7.1                                3X Avg.Risk 23.4   11.0   HgB A1c     Status: None   Collection Time: 09/25/23  9:48 AM  Result Value Ref Range   Hemoglobin A1C     HbA1c POC (<> result, manual entry)     HbA1c, POC (prediabetic range)     HbA1c, POC (controlled diabetic range) 5.5 0.0 - 7.0 %     Assessment & Plan:   1. Hypogonadism  2.  Obesity/sleep apnea  3.  Vitamin D  deficiency.  -Testosterone  replacement helped improve his testosterone  from 74 ->286 -> 782 -> 509 > 978 > 659-> 437 >494>  327-> 506-> 640 -> 673 -> 529 -> 446 -> 340 -> 488 ->325 -> 700 -> 135 -> 228 -> 624 -> 475-> 797-> 97  -Gonadotropins levels indicate a component of secondary hypogonadism given his low normal LH of 1.6 (normal 1.5-9.3) .   -It is possible that it is multifactorial including exposure to heavy opioids, multiple back surgeries, prior inguinal surgery, or idiopathic.  -Prolactin level is favorable at 6.1, he will not need pituitary/sella imaging for now.  -He has benefited from testosterone  replacement therapy and he wishes to be continued on treatment.     - He returns with improving PSA 21 ng/mL dropping from  1.9, however his testosterone  is low at 97.  This  blood draw was at the end of his injection cycle. - He will be continued on the same dose testosterone   100 mg IM every 10 days to give him a total of 300 mg monthly.   Target testosterone  level 300-600 mcg/dL.  - His A1c is 5.5%, indicating absence of diabetes/prediabetes. -His next labs will include a.m. cortisol as his risk for adrenal insufficiency is higher than average due to hx of steroids and opioids. Patient is overdue for his colonoscopy and he is advised to seek GI evaluation given nausea/diarrhea.  -He we will return in 59-months with total and free testosterone  measurements.    Regarding his sleep apnea: He will benefit the most from weight loss.  He is encouraged to stay on change lifestyle nutrition.  - he acknowledges that there is a room for improvement in his food and drink choices. - Suggestion is made for him to avoid simple carbohydrates  from his diet including Cakes, Sweet Desserts, Ice Cream, Soda (diet and regular), Sweet Tea, Candies, Chips, Cookies, Store Bought Juices, Alcohol in Excess of  1-2 drinks a day, Artificial Sweeteners,  Coffee Creamer, and Sugar-free Products, Lemonade. This will help patient  avoid unintended weight gain.  He is advised to continue vitamin D  supplement 2000 units daily. His POC  A1c was 5.5%.   - I advised patient to maintain close follow up with Dettinger, Fonda LABOR, MD for primary care needs.   I spent  22  minutes in the care of the patient today including review of labs from Thyroid  Function, CMP, and other relevant labs ; imaging/biopsy records (current and previous including abstractions from other facilities); face-to-face time discussing  his lab results and symptoms, medications doses, his options of short and long term treatment based on the latest standards of care / guidelines;   and documenting the encounter.  Evalene KIDD Frappier  participated in the discussions, expressed understanding, and voiced agreement with the above plans.  All questions were answered to his satisfaction. he is encouraged to contact clinic should he have any questions or concerns prior to his return visit.   Follow up plan: Return in about 4 months (around 01/26/2024) for Fasting Labs  in AM B4 8.  Ranny Earl, MD Phone: [phone]  Fax: [phone]   This note was partially dictated with voice recognition software. Similar sounding words can be transcribed inadequately or may not  be corrected upon review.  09/25/2023, 10:25 AM

## 2023-09-27 ENCOUNTER — Other Ambulatory Visit: Payer: Self-pay | Admitting: Family Medicine

## 2023-09-27 DIAGNOSIS — F32A Depression, unspecified: Secondary | ICD-10-CM

## 2023-09-27 LAB — LIPID PANEL
Chol/HDL Ratio: 3.4 ratio (ref 0.0–5.0)
Cholesterol, Total: 170 mg/dL (ref 100–199)
HDL: 50 mg/dL (ref >39)
LDL Chol Calc (NIH): 104 mg/dL — ABNORMAL HIGH (ref 0–99)
Triglycerides: 84 mg/dL (ref 0–149)
VLDL Cholesterol Cal: 16 mg/dL (ref 5–40)

## 2023-09-27 LAB — PSA: Prostate Specific Ag, Serum: 1 ng/mL (ref 0.0–4.0)

## 2023-09-27 LAB — TESTOSTERONE, FREE, TOTAL, SHBG
Sex Hormone Binding: 25.6 nmol/L (ref 19.3–76.4)
Testosterone, Free: 1.5 pg/mL — ABNORMAL LOW (ref 6.6–18.1)
Testosterone: 91 ng/dL — ABNORMAL LOW (ref 264–916)

## 2023-09-29 DIAGNOSIS — L738 Other specified follicular disorders: Secondary | ICD-10-CM | POA: Diagnosis not present

## 2023-09-29 DIAGNOSIS — L309 Dermatitis, unspecified: Secondary | ICD-10-CM | POA: Diagnosis not present

## 2023-09-29 DIAGNOSIS — D485 Neoplasm of uncertain behavior of skin: Secondary | ICD-10-CM | POA: Diagnosis not present

## 2023-09-29 DIAGNOSIS — D044 Carcinoma in situ of skin of scalp and neck: Secondary | ICD-10-CM | POA: Diagnosis not present

## 2023-09-29 DIAGNOSIS — L570 Actinic keratosis: Secondary | ICD-10-CM | POA: Diagnosis not present

## 2023-10-04 ENCOUNTER — Encounter: Payer: Self-pay | Admitting: Neurology

## 2023-10-04 ENCOUNTER — Ambulatory Visit: Payer: Medicare Other | Admitting: Neurology

## 2023-10-04 VITALS — BP 138/82 | HR 66 | Ht 73.0 in

## 2023-10-04 DIAGNOSIS — G4739 Other sleep apnea: Secondary | ICD-10-CM

## 2023-10-04 DIAGNOSIS — G4733 Obstructive sleep apnea (adult) (pediatric): Secondary | ICD-10-CM

## 2023-10-04 NOTE — Progress Notes (Signed)
 Patient: Jeff Stewart Date of Birth: 15-Jun-1955  Reason for Visit: Follow up History from: Patient Primary Neurologist: Dohmeier   ASSESSMENT AND PLAN 68 y.o. year old male   1.  Complex apnea, central sleep apnea on BiPAP  - AHI very high 52, feeling terrible, tired all the time. Mask is leaking, but doubtful that significant of an impact on the AHI - BiPAP titration, consider need to switch to BiPAP ST and then possibly ASV if centrals continue - Reports RLS has resolved after cervical spine fusion  - Plan to follow-up 3-4 months after in lab titration   I personally spent a total of 40 minutes in the care of the patient today including preparing to see the patient, getting/reviewing separately obtained history, performing a medically appropriate exam/evaluation, counseling and educating, placing orders, documenting clinical information in the EHR, independently interpreting results, communicating results, and coordinating care.  HISTORY OF PRESENT ILLNESS: Today 10/04/23 Saw Dr. Chalice in February 2025 with complex sleep apnea, central sleep apnea on BiPAP.  Using the BiPAP caused more central apnea to emerge with an AHI of over 30.  Plan to return for BiPAP-ST-ASV titration.  He had BiPAP titration 05/15/23 with pressure change from 15/10 cm to 20/16 cm, AHI improved to 1.8/h.  Using AirFit F40 fullface mask size medium.  Here today for follow-up.  On BiPAP 20/16 cm.  100% compliance, leak 54.7, AHI 52 (central 38.8, obstructive 3.1, unknown 9.7).  Feeling terrible, he is tired, can hardly make himself get out of the house.  Feels tired all the time. Does heating and air conditioner part time. RLS is under good control. Had cervical fusion in April, may need lumbar fusion. Reviewed sleep report with sleep lab manager. ESS 18.  HISTORY  05/02/23 Dr. Marlow: Jeff Stewart is a 68 y.o. male patient who is here for revisit 05/02/2023 for  BiPAP  therapy review. .  Chief  concern according to patient :   on a morphine  pump-phonic severe pain   Mr. Werling has a history of pain, failed back surgeries, and he underwent a split-night study here after being diagnosed with severe apnea and a home sleep test in summer 2024.  The split-night titration went on to provide BiPAP at 18 over 14 cm water  and a BiPAP was written for 20 over 16 cm water  hoping that this would eliminate at least all obstructive apneas.  This indeed seems to have happened but the central apneas remain and have actually increased.  So this patient started office complex sleep apnea and the central component is probably opioid induced, however his AHI has remained very high and all central while on BiPAP 20 over 16 cm water .  I will ask the patient to return for an in-lab BiPAP ST or ASV titration now.   On December 6 th, he had his spinal card stimulator removed and soo after returned for in lab titration.    In addition, he has had nights of not sleeping at all is very exhausted and has actually been seen in the emergency room several times.   Dr Buck read  the preceding baseline study  in my absence- and ordered the return to full night PAP titration.Jeff Stewart is a 68 year-old patient who had years ago been diagnosed with OSA, was unable to tolerate CPAP and had undergone several ENT surgeries. 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  HST was recently done somewhere ( not included in his referral ) and he had seen ENT Dr Carlie but failed the endoscopy for [facility] -   He reports excessive daytime sleepiness and is willing to try other modalities than CPAP if this should still not be tolerated well.  The Epworth Sleepiness Scale was 23 /24 (scores above or equal to 10 are suggestive of hypersomnolence).   REVIEW OF SYSTEMS: Out of a complete 14 system review of symptoms, the patient complains only of the following symptoms, and all  other reviewed systems are negative.  See HPI  ALLERGIES: Allergies  Allergen Reactions   Nalbuphine Nausea And Vomiting, Rash and Shortness Of Breath   Nubain [Nalbuphine Hcl] Rash    HOME MEDICATIONS: Outpatient Medications Prior to Visit  Medication Sig Dispense Refill   acetaminophen  (TYLENOL ) 500 MG tablet Take 500 mg by mouth every 6 (six) hours as needed.     Cholecalciferol (VITAMIN D ) 50 MCG (2000 UT) CAPS Take 2,000 Units by mouth daily with lunch.     fluticasone  (FLONASE ) 50 MCG/ACT nasal spray Place 2 sprays into both nostrils daily. 16 g 11   furosemide  (LASIX ) 40 MG tablet Take 1 tablet (40 mg total) by mouth every morning. 90 tablet 3   hydrochlorothiazide  (HYDRODIURIL ) 25 MG tablet Take 1 tablet (25 mg total) by mouth daily. TAKE 1 TABLET DAILY. 90 tablet 3   losartan  (COZAAR ) 50 MG tablet TAKE ONE TABLET TWICE DAILY 180 tablet 0   pantoprazole  (PROTONIX ) 40 MG tablet TAKE 1 TABLET DAILY 90 tablet 0   sertraline  (ZOLOFT ) 100 MG tablet TAKE 1 & 1/2 TABLETS DAILY 135 tablet 0   Syringe/Needle, Disp, (SYRINGE 3CC/21GX1-1/4) 21G X 1-1/4 3 ML MISC 1 each by Does not apply route once a week. 100 each 0   testosterone  cypionate (DEPOTESTOSTERONE CYPIONATE) 200 MG/ML injection INJECT 0.5 MLS EVERY 10 DAYS. 10 mL 0   dipyridamole -aspirin  (AGGRENOX ) 200-25 MG 12hr capsule Take 1 capsule by mouth 2 (two) times daily. 60 capsule 1   potassium chloride  SA (KLOR-CON  M) 20 MEQ tablet Take 2 tablets (40 mEq total) by mouth 2 (two) times daily for 7 days. 28 tablet 0   Turmeric 500 MG CAPS Take by mouth.     No facility-administered medications prior to visit.    PAST MEDICAL HISTORY: Past Medical History:  Diagnosis Date   Anxiety    Carpal tunnel syndrome, bilateral    Cataract    removed years ago   Chronic back pain    internal morphine pump   DDD (degenerative disc disease)    neck, lumbar   Depression    Dyslipidemia    diet controlled   GERD (gastroesophageal  reflux disease)    past hx- had nissen fundiplication    Hypertension    borderline   Sleep apnea    wears C-PAP   Status post insertion of spinal cord stimulator     PAST SURGICAL HISTORY: Past Surgical History:  Procedure Laterality Date   BACK SURGERY     x 6   CARPAL TUNNEL RELEASE     bilateral   COLONOSCOPY     ELBOW SURGERY     INGUINAL HERNIA REPAIR Right    INTERNAL MORPHINE PUMP      KNEE ARTHROSCOPY     NASAL SEPTUM SURGERY     x 6   neck fusion   07/18/2023   neck surgery pe rpt   NISSEN FUNDOPLICATION  SKIN CANCER EXCISION     SPINAL CORD STIMULATOR INSERTION     SPINAL CORD STIMULATOR REMOVAL  02/2023   STOMACH SURGERY     Nissen Fundiplication   thumb surgery     TOTAL KNEE ARTHROPLASTY Left 04/24/2018   Procedure: TOTAL KNEE ARTHROPLASTY;  Surgeon: Beverley Evalene BIRCH, MD;  Location: [facility];  Service: Orthopedics;  Laterality: Left;   UPPER GASTROINTESTINAL ENDOSCOPY      FAMILY HISTORY: Family History  Problem Relation Age of Onset   CAD Father 22   Emphysema Father    Stroke Mother 102   CAD Brother 36       CABG   Alcohol abuse Brother    Hypertension Brother    Stroke Brother    CAD Brother    Cirrhosis Brother    Alcohol abuse Brother    Hypertension Brother    CAD Sister    Hypertension Sister    Hyperlipidemia Sister    Cancer Sister        melanoma   Cancer Sister    Lung cancer Sister    Cancer Brother    Lung cancer Brother    Cancer Brother    Lung cancer Brother    Colon cancer Neg Hx    Colon polyps Neg Hx    Esophageal cancer Neg Hx    Rectal cancer Neg Hx    Stomach cancer Neg Hx     SOCIAL HISTORY: Social History   Socioeconomic History   Marital status: Married    Spouse name: Not on file   Number of children: 3   Years of education: Not on file   Highest education level: Not on file  Occupational History   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  Tobacco Use   Smoking status: Never    Smokeless tobacco: Never  Vaping Use   Vaping status: Never Used  Substance and Sexual Activity   Alcohol use: No   Drug use: No   Sexual activity: Not Currently    Birth control/protection: Post-menopausal    Comment: married for 1972  Other Topics Concern   Not on file  Social History Narrative   Lives with wife    Children live nearby   Retired    Social Drivers of Corporate investment banker Strain: Low Risk  (08/17/2023)   Received from [facility]   Overall Financial Resource Strain (CARDIA)    Difficulty of Paying Living Expenses: Not hard at all  Food Insecurity: No Food Insecurity (08/17/2023)   [facility] from [facility]   Hunger Vital Sign    Within the past 12 months, you worried that your food would run out before you got the money to buy more.: Never true    Within the past 12 months, the food you bought just didn't last and you didn't have money to get more.: Never true  Transportation Needs: No Transportation Needs (08/17/2023)   Received from [facility] - Transportation    Lack of Transportation (Medical): No    Lack of Transportation (Non-Medical): No  Physical Activity: Unknown (08/07/2023)   Exercise Vital Sign    Days of Exercise per Week: 0 days    Minutes of Exercise per Session: Not on file  Stress: No Stress Concern Present (08/07/2023)   [employer] of Occupational Health - Occupational Stress Questionnaire    Feeling of Stress : Only a little  Social Connections: Moderately Isolated (08/07/2023)  Social Advertising account executive    Frequency of Communication with Friends and Family: Once a week    Frequency of Social Gatherings with Friends and Family: Once a week    Attends Religious Services: More than 4 times per year    Active Member of [employer] or Organizations: No    Attends Banker Meetings: Never    Marital Status: Married  Catering manager Violence: Not At Risk (08/07/2023)   Humiliation, Afraid, Rape,  and Kick questionnaire    Fear of Current or Ex-Partner: No    Emotionally Abused: No    Physically Abused: No    Sexually Abused: No   PHYSICAL EXAM  Vitals:   10/04/23 1322  BP: 138/82  Pulse: 66  SpO2: 94%  Height: 6' 1 (1.854 m)   Body mass index is 33.25 kg/m.  Generalized: Well developed, in no acute distress  Neurological examination  Mentation: Alert oriented to time, place, history taking. Follows all commands speech and language fluent Cranial nerve II-XII: Pupils were equal round reactive to light. Extraocular movements were full, visual field were full on confrontational test. Facial sensation and strength were normal. Head turning and shoulder shrug  were normal and symmetric. Motor: The motor testing reveals 5 over 5 strength of all 4 extremities. Good symmetric motor tone is noted throughout.  Gait and station: Gait is normal.   DIAGNOSTIC DATA (LABS, IMAGING, TESTING) - I reviewed patient records, labs, notes, testing and imaging myself where available.  Lab Results  Component Value Date   WBC 5.8 05/18/2023   HGB 15.2 05/18/2023   HCT 47.1 05/18/2023   MCV 88 05/18/2023   PLT 218 05/18/2023      Component Value Date/Time   NA 138 05/06/2023 0032   NA 141 03/27/2023 1335   K 3.2 (L) 05/06/2023 0032   CL 101 05/06/2023 0032   CO2 26 05/06/2023 0032   GLUCOSE 100 (H) 05/06/2023 0032   BUN 16 05/06/2023 0032   BUN 16 03/27/2023 1335   CREATININE 1.03 05/06/2023 0032   CALCIUM 9.3 05/06/2023 0032   PROT 7.2 03/27/2023 1335   ALBUMIN 4.7 03/27/2023 1335   AST 16 03/27/2023 1335   ALT 17 03/27/2023 1335   ALKPHOS 100 03/27/2023 1335   BILITOT 0.4 03/27/2023 1335   GFRNONAA >60 05/06/2023 0032   GFRAA 95 10/30/2019 0836   Lab Results  Component Value Date   CHOL 170 09/20/2023   HDL 50 09/20/2023   LDLCALC 104 (H) 09/20/2023   TRIG 84 09/20/2023   CHOLHDL 3.4 09/20/2023   Lab Results  Component Value Date   HGBA1C 5.5 09/25/2023   Lab  Results  Component Value Date   VITAMINB12 275 09/10/2021   Lab Results  Component Value Date   TSH 1.410 03/27/2023   Lauraine Born, AGNP-C, DNP 10/04/2023, 1:39 PM [facility] [address] [place], [place] [address] [phone]

## 2023-10-04 NOTE — Patient Instructions (Signed)
 We will bring you in for a BiPAP titration study.

## 2023-10-05 ENCOUNTER — Telehealth: Payer: Self-pay | Admitting: *Deleted

## 2023-10-05 DIAGNOSIS — I10 Essential (primary) hypertension: Secondary | ICD-10-CM

## 2023-10-05 MED ORDER — FUROSEMIDE 40 MG PO TABS: 40.0000 mg | ORAL_TABLET | Freq: Every morning | ORAL | 0 refills | Status: DC

## 2023-10-05 MED ORDER — LOSARTAN POTASSIUM 50 MG PO TABS: 50.0000 mg | ORAL_TABLET | Freq: Two times a day (BID) | ORAL | 0 refills | Status: DC

## 2023-10-05 MED ORDER — HYDROCHLOROTHIAZIDE 25 MG PO TABS: 25.0000 mg | ORAL_TABLET | Freq: Every day | ORAL | 0 refills | Status: DC

## 2023-10-05 NOTE — Telephone Encounter (Signed)
 TC from [facility] pharmacy for RFs, escripts not going through on their end. RFs sent.

## 2023-10-12 DIAGNOSIS — T84296A Other mechanical complication of internal fixation device of vertebrae, initial encounter: Secondary | ICD-10-CM | POA: Diagnosis not present

## 2023-10-12 DIAGNOSIS — M5441 Lumbago with sciatica, right side: Secondary | ICD-10-CM | POA: Diagnosis not present

## 2023-10-12 DIAGNOSIS — M4015 Other secondary kyphosis, thoracolumbar region: Secondary | ICD-10-CM | POA: Diagnosis not present

## 2023-10-12 DIAGNOSIS — Z981 Arthrodesis status: Secondary | ICD-10-CM | POA: Diagnosis not present

## 2023-10-12 DIAGNOSIS — M542 Cervicalgia: Secondary | ICD-10-CM | POA: Diagnosis not present

## 2023-10-12 DIAGNOSIS — M5442 Lumbago with sciatica, left side: Secondary | ICD-10-CM | POA: Diagnosis not present

## 2023-10-12 DIAGNOSIS — M532X5 Spinal instabilities, thoracolumbar region: Secondary | ICD-10-CM | POA: Diagnosis not present

## 2023-10-12 DIAGNOSIS — G8929 Other chronic pain: Secondary | ICD-10-CM | POA: Diagnosis not present

## 2023-10-13 ENCOUNTER — Telehealth: Payer: Self-pay | Admitting: Neurology

## 2023-10-13 NOTE — Telephone Encounter (Signed)
 BIPAP [employer] medicare no auth req   Sent mychart

## 2023-10-17 NOTE — Telephone Encounter (Signed)
 Bipap [employer] medicare no auth req   Patient is scheduled at [facility] for 11/13/23 at 9 pm  Mailed packet and sent mychart

## 2023-10-18 ENCOUNTER — Encounter: Payer: Self-pay | Admitting: Family Medicine

## 2023-10-18 ENCOUNTER — Ambulatory Visit (INDEPENDENT_AMBULATORY_CARE_PROVIDER_SITE_OTHER): Payer: Medicare Other | Admitting: Family Medicine

## 2023-10-18 DIAGNOSIS — F32A Depression, unspecified: Secondary | ICD-10-CM | POA: Diagnosis not present

## 2023-10-18 DIAGNOSIS — F419 Anxiety disorder, unspecified: Secondary | ICD-10-CM | POA: Diagnosis not present

## 2023-10-18 DIAGNOSIS — I10 Essential (primary) hypertension: Secondary | ICD-10-CM | POA: Diagnosis not present

## 2023-10-18 DIAGNOSIS — K219 Gastro-esophageal reflux disease without esophagitis: Secondary | ICD-10-CM

## 2023-10-18 LAB — LIPID PANEL

## 2023-10-18 MED ORDER — FUROSEMIDE 40 MG PO TABS: 40.0000 mg | ORAL_TABLET | Freq: Every morning | ORAL | 3 refills | Status: DC

## 2023-10-18 MED ORDER — HYDROCHLOROTHIAZIDE 25 MG PO TABS: 25.0000 mg | ORAL_TABLET | Freq: Every day | ORAL | 3 refills | Status: AC

## 2023-10-18 MED ORDER — SERTRALINE HCL 100 MG PO TABS: 150.0000 mg | ORAL_TABLET | Freq: Every day | ORAL | 3 refills | Status: DC

## 2023-10-18 MED ORDER — LOSARTAN POTASSIUM 50 MG PO TABS: 50.0000 mg | ORAL_TABLET | Freq: Two times a day (BID) | ORAL | 3 refills | Status: AC

## 2023-10-18 MED ORDER — PANTOPRAZOLE SODIUM 40 MG PO TBEC: 40.0000 mg | DELAYED_RELEASE_TABLET | Freq: Every day | ORAL | 3 refills | Status: AC

## 2023-10-18 NOTE — Progress Notes (Signed)
 BP 133/78   Pulse 63   Temp 98 F (36.7 C)   Ht 6' 1 (1.854 m)   Wt 252 lb 12.8 oz (114.7 kg)   SpO2 93%   BMI 33.35 kg/m    Subjective:   Patient ID: Jeff Stewart, male    DOB: 1955-08-29, 68 y.o.   MRN: [id number]  HPI: Jeff Stewart is a 68 y.o. male presenting on 10/18/2023 for Medical Management of Chronic Issues (Sleeping is all messed up going for another sleep study this month )   HPI Hypertension Patient is currently on furosemide  and hydrochlorothiazide  and losartan , and their blood pressure today is 133/78. Patient denies any lightheadedness or dizziness. Patient denies headaches, blurred vision, chest pains, shortness of breath, or weakness. Denies any side effects from medication and is content with current medication.   GERD Patient is currently on pantoprazole .  She denies any major symptoms or abdominal pain or belching or burping. She denies any blood in her stool or lightheadedness or dizziness.   Anxiety and depression recheck Patient is currently taking Zoloft .  Sertraline  has been doing well for him.  He has had a lot of health issues recently but other than the health issues he said mood wise he feels like he is doing well.  His energy is down but they think that it is related to his sleep apnea.  He is going to get retested for that.    10/18/2023    9:33 AM 08/07/2023    8:15 AM 04/19/2023   10:47 AM 03/27/2023    1:01 PM 02/13/2023   10:26 AM  Depression screen PHQ 2/9  Decreased Interest 2 1 1 3 2   Down, Depressed, Hopeless 1 2 0 3 1  PHQ - 2 Score 3 3 1 6 3   Altered sleeping 2 0 3 2 2   Tired, decreased energy 3 2 1 3 3   Change in appetite 1 0 2 3 1   Feeling bad or failure about yourself  0 0 0 0 0  Trouble concentrating 1 0   1  Moving slowly or fidgety/restless 1 0 0 2 0  Suicidal thoughts 0 0 1 0 0  PHQ-9 Score 11 5 8 16 10   Difficult doing work/chores Not difficult at all Not difficult at all Somewhat difficult Somewhat difficult  Somewhat difficult     Relevant past medical, surgical, family and social history reviewed and updated as indicated. Interim medical history since our last visit reviewed. Allergies and medications reviewed and updated.  Review of Systems  Constitutional:  Negative for chills and fever.  Eyes:  Negative for visual disturbance.  Respiratory:  Negative for shortness of breath and wheezing.   Cardiovascular:  Negative for chest pain and leg swelling.  Musculoskeletal:  Positive for arthralgias, back pain, myalgias and neck pain. Negative for gait problem.  Skin:  Negative for rash.  Neurological:  Negative for dizziness and light-headedness.  All other systems reviewed and are negative.   Per HPI unless specifically indicated above   Allergies as of 10/18/2023       Reactions   Nalbuphine Nausea And Vomiting, Rash, Shortness Of Breath   Nubain [nalbuphine Hcl] Rash        Medication List        Accurate as of October 18, 2023  9:40 AM. If you have any questions, ask your nurse or doctor.          acetaminophen  500 MG tablet Commonly known as: TYLENOL   Take 500 mg by mouth every 6 (six) hours as needed.   fluticasone  50 MCG/ACT nasal spray Commonly known as: FLONASE  Place 2 sprays into both nostrils daily.   furosemide  40 MG tablet Commonly known as: LASIX  Take 1 tablet (40 mg total) by mouth every morning.   hydrochlorothiazide  25 MG tablet Commonly known as: HYDRODIURIL  Take 1 tablet (25 mg total) by mouth daily. TAKE 1 TABLET DAILY.   losartan  50 MG tablet Commonly known as: COZAAR  Take 1 tablet (50 mg total) by mouth 2 (two) times daily.   pantoprazole  40 MG tablet Commonly known as: PROTONIX  Take 1 tablet (40 mg total) by mouth daily.   sertraline  100 MG tablet Commonly known as: ZOLOFT  Take 1.5 tablets (150 mg total) by mouth daily.   SYRINGE 3CC/21GX1-1/4 21G X 1-1/4 3 ML Misc 1 each by Does not apply route once a week.   testosterone  cypionate  200 MG/ML injection Commonly known as: DEPOTESTOSTERONE CYPIONATE INJECT 0.5 MLS EVERY 10 DAYS.   Vitamin D  50 MCG (2000 UT) Caps Take 2,000 Units by mouth daily with lunch.         Objective:   BP 133/78   Pulse 63   Temp 98 F (36.7 C)   Ht 6' 1 (1.854 m)   Wt 252 lb 12.8 oz (114.7 kg)   SpO2 93%   BMI 33.35 kg/m   Wt Readings from Last 3 Encounters:  10/18/23 252 lb 12.8 oz (114.7 kg)  09/25/23 252 lb (114.3 kg)  08/07/23 255 lb (115.7 kg)    Physical Exam Vitals and nursing note reviewed.  Constitutional:      General: He is not in acute distress.    Appearance: He is well-developed. He is not diaphoretic.  Eyes:     General: No scleral icterus.    Conjunctiva/sclera: Conjunctivae normal.  Neck:     Thyroid : No thyromegaly.  Cardiovascular:     Rate and Rhythm: Normal rate and regular rhythm.     Heart sounds: Normal heart sounds. No murmur heard. Pulmonary:     Effort: Pulmonary effort is normal. No respiratory distress.     Breath sounds: Normal breath sounds. No wheezing.  Musculoskeletal:        General: No swelling. Normal range of motion.     Cervical back: Neck supple.  Lymphadenopathy:     Cervical: No cervical adenopathy.  Skin:    General: Skin is warm and dry.     Findings: No rash.  Neurological:     Mental Status: He is alert and oriented to person, place, and time.     Coordination: Coordination normal.  Psychiatric:        Behavior: Behavior normal.       Assessment & Plan:   Problem List Items Addressed This Visit       Cardiovascular and Mediastinum   Essential hypertension, benign   Relevant Medications   furosemide  (LASIX ) 40 MG tablet   hydrochlorothiazide  (HYDRODIURIL ) 25 MG tablet   losartan  (COZAAR ) 50 MG tablet   Other Relevant Orders   CBC with Differential/Platelet   CMP14+EGFR   Lipid panel     Digestive   GERD (gastroesophageal reflux disease)   Relevant Medications   pantoprazole  (PROTONIX ) 40 MG tablet    Other Relevant Orders   CBC with Differential/Platelet   CMP14+EGFR   Lipid panel     Other   Anxiety and depression   Relevant Medications   sertraline  (ZOLOFT ) 100 MG tablet  Other Relevant Orders   CBC with Differential/Platelet   CMP14+EGFR   Lipid panel    Will test blood work today, seems to be doing well except for his back and neck issues but he is working with the Careers adviser on that.  He says he is still fatigued but is going to recheck his sleep apnea settings. Follow up plan: Return in about 6 months (around 04/19/2024), or if symptoms worsen or fail to improve, for Physical exam and hypertension.  Counseling provided for all of the vaccine components Orders Placed This Encounter  Procedures   CBC with Differential/Platelet   CMP14+EGFR   Lipid panel    Fonda Levins, MD [facility] 10/18/2023, 9:40 AM

## 2023-10-19 LAB — CBC WITH DIFFERENTIAL/PLATELET
Basophils Absolute: 0 x10E3/uL (ref 0.0–0.2)
Basos: 1 %
EOS (ABSOLUTE): 0.1 x10E3/uL (ref 0.0–0.4)
Eos: 4 %
Hematocrit: 41.6 % (ref 37.5–51.0)
Hemoglobin: 13.2 g/dL (ref 13.0–17.7)
Immature Grans (Abs): 0 x10E3/uL (ref 0.0–0.1)
Immature Granulocytes: 0 %
Lymphocytes Absolute: 1.3 x10E3/uL (ref 0.7–3.1)
Lymphs: 33 %
MCH: 28.4 pg (ref 26.6–33.0)
MCHC: 31.7 g/dL (ref 31.5–35.7)
MCV: 90 fL (ref 79–97)
Monocytes Absolute: 0.4 x10E3/uL (ref 0.1–0.9)
Monocytes: 10 %
Neutrophils Absolute: 2 x10E3/uL (ref 1.4–7.0)
Neutrophils: 52 %
Platelets: 175 x10E3/uL (ref 150–450)
RBC: 4.65 x10E6/uL (ref 4.14–5.80)
RDW: 12.9 % (ref 11.6–15.4)
WBC: 3.9 x10E3/uL (ref 3.4–10.8)

## 2023-10-19 LAB — CMP14+EGFR
ALT: 14 IU/L (ref 0–44)
AST: 16 IU/L (ref 0–40)
Albumin: 4.2 g/dL (ref 3.9–4.9)
Alkaline Phosphatase: 88 IU/L (ref 44–121)
BUN/Creatinine Ratio: 20 (ref 10–24)
BUN: 18 mg/dL (ref 8–27)
Bilirubin Total: 0.5 mg/dL (ref 0.0–1.2)
CO2: 25 mmol/L (ref 20–29)
Calcium: 9 mg/dL (ref 8.6–10.2)
Chloride: 100 mmol/L (ref 96–106)
Creatinine, Ser: 0.91 mg/dL (ref 0.76–1.27)
Globulin, Total: 2 g/dL (ref 1.5–4.5)
Glucose: 91 mg/dL (ref 70–99)
Potassium: 3.8 mmol/L (ref 3.5–5.2)
Sodium: 140 mmol/L (ref 134–144)
Total Protein: 6.2 g/dL (ref 6.0–8.5)
eGFR: 91 mL/min/1.73 (ref >59)

## 2023-10-19 LAB — LIPID PANEL
Cholesterol, Total: 165 mg/dL (ref 100–199)
HDL: 46 mg/dL (ref >39)
LDL CALC COMMENT:: 3.6 ratio (ref 0.0–5.0)
LDL Chol Calc (NIH): 104 mg/dL — AB (ref 0–99)
Triglycerides: 80 mg/dL (ref 0–149)
VLDL Cholesterol Cal: 15 mg/dL (ref 5–40)

## 2023-10-20 DIAGNOSIS — Z09 Encounter for follow-up examination after completed treatment for conditions other than malignant neoplasm: Secondary | ICD-10-CM | POA: Diagnosis not present

## 2023-10-20 DIAGNOSIS — Z872 Personal history of diseases of the skin and subcutaneous tissue: Secondary | ICD-10-CM | POA: Diagnosis not present

## 2023-10-20 DIAGNOSIS — L578 Other skin changes due to chronic exposure to nonionizing radiation: Secondary | ICD-10-CM | POA: Diagnosis not present

## 2023-10-20 DIAGNOSIS — D044 Carcinoma in situ of skin of scalp and neck: Secondary | ICD-10-CM | POA: Diagnosis not present

## 2023-10-25 ENCOUNTER — Ambulatory Visit: Payer: Self-pay | Admitting: Family Medicine

## 2023-11-06 ENCOUNTER — Encounter: Payer: Self-pay | Admitting: Neurology

## 2023-11-08 ENCOUNTER — Encounter: Payer: Self-pay | Admitting: Family Medicine

## 2023-11-08 ENCOUNTER — Ambulatory Visit (INDEPENDENT_AMBULATORY_CARE_PROVIDER_SITE_OTHER): Admitting: Family Medicine

## 2023-11-08 ENCOUNTER — Ambulatory Visit: Payer: Self-pay | Admitting: Family Medicine

## 2023-11-08 VITALS — BP 136/74 | HR 62 | Temp 98.3°F | Ht 73.0 in | Wt 249.0 lb

## 2023-11-08 DIAGNOSIS — R110 Nausea: Secondary | ICD-10-CM | POA: Diagnosis not present

## 2023-11-08 DIAGNOSIS — G4733 Obstructive sleep apnea (adult) (pediatric): Secondary | ICD-10-CM | POA: Diagnosis not present

## 2023-11-08 DIAGNOSIS — R197 Diarrhea, unspecified: Secondary | ICD-10-CM | POA: Diagnosis not present

## 2023-11-08 DIAGNOSIS — R5383 Other fatigue: Secondary | ICD-10-CM | POA: Diagnosis not present

## 2023-11-08 LAB — URINALYSIS, COMPLETE
Bilirubin, UA: NEGATIVE
Glucose, UA: NEGATIVE
Ketones, UA: NEGATIVE
Leukocytes,UA: NEGATIVE
Nitrite, UA: NEGATIVE
Protein,UA: NEGATIVE
RBC, UA: NEGATIVE
Specific Gravity, UA: <1.005 — ABNORMAL LOW (ref 1.005–1.030)
Urobilinogen, Ur: 0.2 mg/dL (ref 0.2–1.0)
pH, UA: 6 (ref 5.0–7.5)

## 2023-11-08 NOTE — Progress Notes (Signed)
 BP 136/74   Pulse 62   Temp 98.3 F (36.8 C)   Ht 6' 1 (1.854 m)   Wt 249 lb (112.9 kg)   SpO2 97%   BMI 32.85 kg/m    Subjective:   Patient ID: Jeff Stewart, male    DOB: 1956-02-23, 68 y.o.   MRN: [id number]  HPI: Jeff Stewart is a 68 y.o. male presenting on 11/08/2023 for No chief complaint on file.   Discussed the use of AI scribe software for clinical note transcription with the patient, who gave verbal consent to proceed.  History of Present Illness   Jeff Stewart is a 68 year old male with obstructive sleep apnea who presents with nausea, diarrhea, and fatigue.  He has been experiencing nausea and diarrhea for the past five days, which began around Saturday. These symptoms were initially accompanied by an inability to eat for three to four days. The diarrhea has slightly improved as he has resumed eating, although nausea persists at times. No blood in stool or urine, and no urinary burning or pain.  He describes a persistent feeling of exhaustion that has been ongoing for months. Despite using a CPAP mask, he continues to experience frequent apneic episodes during sleep, approximately fifty times an hour. He feels as though he is not getting restful sleep and often sleeps during the day due to fatigue. This exhaustion has impacted his daily activities, including missing church for the past two weeks.  No fever, chills, body aches, sinus pressure, or drainage. He reports some throat soreness, which he attributes to dryness from the CPAP mask. He also mentions a history of a spinal stimulator removal, which he feels may have contributed to his current state of fatigue. He uses a morphine pump, which was decreased about three months ago, and notes that his symptoms began after the stimulant was removed.  He denies any recent changes in his morphine pump settings and reports no swelling in his legs. He is frustrated with the ongoing nature of his symptoms and the impact  on his quality of life.          Relevant past medical, surgical, family and social history reviewed and updated as indicated. Interim medical history since our last visit reviewed. Allergies and medications reviewed and updated.  Review of Systems  Constitutional:  Positive for fatigue. Negative for chills and fever.  Respiratory:  Negative for shortness of breath and wheezing.   Cardiovascular:  Negative for chest pain and leg swelling.  Gastrointestinal:  Positive for diarrhea and nausea.  Musculoskeletal:  Negative for back pain and gait problem.  Skin:  Negative for rash.  Neurological:  Negative for dizziness, light-headedness and headaches.  All other systems reviewed and are negative.   Per HPI unless specifically indicated above   Allergies as of 11/08/2023       Reactions   Nalbuphine Nausea And Vomiting, Rash, Shortness Of Breath   Nubain [nalbuphine Hcl] Rash        Medication List        Accurate as of November 08, 2023  9:18 AM. If you have any questions, ask your nurse or doctor.          acetaminophen  500 MG tablet Commonly known as: TYLENOL  Take 500 mg by mouth every 6 (six) hours as needed.   fluticasone  50 MCG/ACT nasal spray Commonly known as: FLONASE  Place 2 sprays into both nostrils daily.   furosemide  40 MG tablet Commonly known as:  LASIX  Take 1 tablet (40 mg total) by mouth every morning.   hydrochlorothiazide  25 MG tablet Commonly known as: HYDRODIURIL  Take 1 tablet (25 mg total) by mouth daily. TAKE 1 TABLET DAILY.   losartan  50 MG tablet Commonly known as: COZAAR  Take 1 tablet (50 mg total) by mouth 2 (two) times daily.   pantoprazole  40 MG tablet Commonly known as: PROTONIX  Take 1 tablet (40 mg total) by mouth daily.   sertraline  100 MG tablet Commonly known as: ZOLOFT  Take 1.5 tablets (150 mg total) by mouth daily.   SYRINGE 3CC/21GX1-1/4 21G X 1-1/4 3 ML Misc 1 each by Does not apply route once a week.    testosterone  cypionate 200 MG/ML injection Commonly known as: DEPOTESTOSTERONE CYPIONATE INJECT 0.5 MLS EVERY 10 DAYS.   Vitamin D  50 MCG (2000 UT) Caps Take 2,000 Units by mouth daily with lunch.         Objective:   BP 136/74   Pulse 62   Temp 98.3 F (36.8 C)   Ht 6' 1 (1.854 m)   Wt 249 lb (112.9 kg)   SpO2 97%   BMI 32.85 kg/m   Wt Readings from Last 3 Encounters:  11/08/23 249 lb (112.9 kg)  10/18/23 252 lb 12.8 oz (114.7 kg)  09/25/23 252 lb (114.3 kg)    Physical Exam Vitals and nursing note reviewed.  Constitutional:      General: He is not in acute distress.    Appearance: He is well-developed. He is not diaphoretic.  Eyes:     General: No scleral icterus.    Conjunctiva/sclera: Conjunctivae normal.  Neck:     Thyroid : No thyromegaly.  Cardiovascular:     Rate and Rhythm: Normal rate and regular rhythm.     Heart sounds: Normal heart sounds. No murmur heard. Pulmonary:     Effort: Pulmonary effort is normal. No respiratory distress.     Breath sounds: Normal breath sounds. No wheezing.  Musculoskeletal:        General: No swelling. Normal range of motion.     Cervical back: Neck supple.  Lymphadenopathy:     Cervical: No cervical adenopathy.  Skin:    General: Skin is warm and dry.     Findings: No rash.  Neurological:     Mental Status: He is alert and oriented to person, place, and time.     Coordination: Coordination normal.  Psychiatric:        Behavior: Behavior normal.    Physical Exam   HEENT: Throat normal, no infection. CHEST: Lungs clear to auscultation bilaterally. CARDIOVASCULAR: Heart regular rate and rhythm, no murmurs. ABDOMEN: Abdomen non-tender to palpation. EXTREMITIES: No edema in extremities.         Assessment & Plan:   Problem List Items Addressed This Visit       Respiratory   OSA (obstructive sleep apnea) - Primary   Relevant Orders   CBC with Differential/Platelet   CMP14+EGFR   TSH   Urinalysis,  Complete   Urine Culture     Other   Fatigue   Relevant Orders   CBC with Differential/Platelet   CMP14+EGFR   TSH   Urinalysis, Complete   Urine Culture   Other Visit Diagnoses       Nausea       Relevant Orders   CBC with Differential/Platelet   CMP14+EGFR   TSH   Urinalysis, Complete   Urine Culture     Diarrhea, unspecified type  Relevant Orders   CBC with Differential/Platelet   CMP14+EGFR   TSH   Urinalysis, Complete   Urine Culture           Fatigue and excessive daytime sleepiness Chronic fatigue and excessive daytime sleepiness. Order blood tests for infection markers and thyroid  function. Collect urine sample to rule out infection.  Nausea and diarrhea, recent onset Recent nausea and diarrhea with some improvement upon resuming eating. No blood in stool or fever. Symptoms possibly linked to fatigue.  Obstructive sleep apnea Worsening obstructive sleep apnea. Recent sleep studies show continued apnea episodes. Discussed potential new implantable device. Proceed with scheduled sleep study. Consider referral to a different sleep apnea specialist for second opinion if current management remains ineffective.          Follow up plan: Return if symptoms worsen or fail to improve.  Counseling provided for all of the vaccine components Orders Placed This Encounter  Procedures   Urine Culture   CBC with Differential/Platelet   CMP14+EGFR   TSH   Urinalysis, Complete    Fonda Levins, MD [facility] 11/08/2023, 9:18 AM

## 2023-11-09 LAB — CMP14+EGFR
ALT: 13 IU/L (ref 0–44)
AST: 17 IU/L (ref 0–40)
Albumin: 4.5 g/dL (ref 3.9–4.9)
Alkaline Phosphatase: 97 IU/L (ref 44–121)
BUN/Creatinine Ratio: 17 (ref 10–24)
BUN: 19 mg/dL (ref 8–27)
Bilirubin Total: 0.4 mg/dL (ref 0.0–1.2)
CO2: 28 mmol/L (ref 20–29)
Calcium: 9.6 mg/dL (ref 8.6–10.2)
Chloride: 97 mmol/L (ref 96–106)
Creatinine, Ser: 1.09 mg/dL (ref 0.76–1.27)
Globulin, Total: 2.1 g/dL (ref 1.5–4.5)
Glucose: 89 mg/dL (ref 70–99)
Potassium: 3.8 mmol/L (ref 3.5–5.2)
Sodium: 141 mmol/L (ref 134–144)
Total Protein: 6.6 g/dL (ref 6.0–8.5)
eGFR: 73 mL/min/1.73 (ref >59)

## 2023-11-09 LAB — CBC WITH DIFFERENTIAL/PLATELET
Basophils Absolute: 0 x10E3/uL (ref 0.0–0.2)
Basos: 1 %
EOS (ABSOLUTE): 0.2 x10E3/uL (ref 0.0–0.4)
Eos: 4 %
Hematocrit: 44.5 % (ref 37.5–51.0)
Hemoglobin: 14.1 g/dL (ref 13.0–17.7)
Immature Grans (Abs): 0 x10E3/uL (ref 0.0–0.1)
Immature Granulocytes: 0 %
Lymphocytes Absolute: 1.5 x10E3/uL (ref 0.7–3.1)
Lymphs: 31 %
MCH: 28.6 pg (ref 26.6–33.0)
MCHC: 31.7 g/dL (ref 31.5–35.7)
MCV: 90 fL (ref 79–97)
Monocytes Absolute: 0.6 x10E3/uL (ref 0.1–0.9)
Monocytes: 12 %
Neutrophils Absolute: 2.6 x10E3/uL (ref 1.4–7.0)
Neutrophils: 52 %
Platelets: 215 x10E3/uL (ref 150–450)
RBC: 4.93 x10E6/uL (ref 4.14–5.80)
RDW: 12.8 % (ref 11.6–15.4)
WBC: 5 x10E3/uL (ref 3.4–10.8)

## 2023-11-09 LAB — TSH: TSH: 2.23 u[IU]/mL (ref 0.450–4.500)

## 2023-11-10 LAB — URINE CULTURE

## 2023-11-13 ENCOUNTER — Ambulatory Visit (INDEPENDENT_AMBULATORY_CARE_PROVIDER_SITE_OTHER): Admitting: Neurology

## 2023-11-13 DIAGNOSIS — G894 Chronic pain syndrome: Secondary | ICD-10-CM | POA: Diagnosis not present

## 2023-11-13 DIAGNOSIS — G4739 Other sleep apnea: Secondary | ICD-10-CM | POA: Diagnosis not present

## 2023-11-13 DIAGNOSIS — M51362 Other intervertebral disc degeneration, lumbar region with discogenic back pain and lower extremity pain: Secondary | ICD-10-CM | POA: Diagnosis not present

## 2023-11-13 DIAGNOSIS — G4731 Primary central sleep apnea: Secondary | ICD-10-CM

## 2023-11-13 DIAGNOSIS — M961 Postlaminectomy syndrome, not elsewhere classified: Secondary | ICD-10-CM | POA: Diagnosis not present

## 2023-11-13 DIAGNOSIS — Z978 Presence of other specified devices: Secondary | ICD-10-CM | POA: Diagnosis not present

## 2023-11-14 ENCOUNTER — Encounter: Payer: Self-pay | Admitting: Family Medicine

## 2023-11-15 MED ORDER — ONDANSETRON 4 MG PO TBDP: 4.0000 mg | ORAL_TABLET | Freq: Three times a day (TID) | ORAL | 0 refills | Status: DC | PRN

## 2023-11-16 ENCOUNTER — Other Ambulatory Visit: Payer: Self-pay | Admitting: Neurology

## 2023-11-16 DIAGNOSIS — G4731 Primary central sleep apnea: Secondary | ICD-10-CM | POA: Insufficient documentation

## 2023-11-16 DIAGNOSIS — G4739 Other sleep apnea: Secondary | ICD-10-CM

## 2023-11-16 NOTE — Progress Notes (Signed)
:   BiPAP ST : The needed setting will be a pressure of  24/ 20 cm water  and ST 10/min. A Vitera FFM was used in S/M size.  Heated humidification will be provided. Follow up with NP Slack after 75 days on new therapy.

## 2023-11-16 NOTE — Procedures (Signed)
 [facility] PAP TITRATION INTERPRETATION REPORT   STUDY DATE: 11/13/2023      PATIENT NAME:  Jeff Stewart         DATE OF BIRTH:  [birth year]  PATIENT ID:  [id number]    TYPE OF STUDY:  BIPAP TITRATION  READING PHYSICIAN: Dedra Gores, MD    Referred by:  LAURAINE BORN, NP/ Dr Dettinger PCP  SCORING TECHNICIAN: Donnice Counts, RPSGT   HISTORY:  MATTHEUS Stewart is a 68 year-old-patient of Dr. Williemae who referred this pt for treatment of sleep apnea in a potentially PAP intolerant patient ( and who was not a candidate for Inspire)  . He had multiple ENT, cervical and lumbar surgeries. Implanted pain stimulator. Epworth Sleepiness Scale was endorsed 23 out of 24 (scores above or equal to 10 are suggestive of hypersomnolence).  DESCRIPTION: A sleep technologist was in attendance for the duration of the recording.  Data collection, scoring, video monitoring, and reporting were performed in compliance with the AASM Manual for the Scoring of Sleep and Associated Events;  ADDITIONAL INFORMATION:  Height: 73.0 in Weight: 257 lb (BMI 33) Neck Size: 19.5 in   MEDICATIONS: Tylenol , Zyrtec , Vitamin D , Flonase , Lasix , Hydrodiuril , Cozaar , Protonix , Zoloft , Depo-testosterone  Cypionate, Turmeric, Tessalon  Perles   SLEEP CONTINUITY AND SLEEP ARCHITECTURE:  The patient was given a FFM, EVORA in size M/S ( had used an XS size at home with large air leaks): BILEVEL titration started at 15/ 10 cm water  and progressed to 20/ 16 cm water  without major impact on sleep disordered breathing. The AHI remained at 80/h , central apneas were present.  BiPAP changed to BiPAP ST with (ST) rate of 8/min. and 20/ 16 cm water  pressure and then to ST 10 /minute and advanced to 24/ 20 cm water -  This last setting achieved a high sleep efficiency, REM sleep, and AHI of 0.0/h by CMS criteria.     Lights off was at 22:07: and lights on 05:19: (7.2 hours in bed). Total sleep time was 364.5  minutes (88.1% supine;  11.9% lateral;  0.0% prone, 8.1% REM sleep), with sleep efficiency at 85%.  Sleep latency was brief at 7.0 minutes.   Of the total sleep time, the percentage of stage N1 sleep was 12.3%, stage N2 sleep was 78.6%, stage N3 sleep was 1.0%, and REM sleep was 8.1%. There were 2 Stage R periods observed on this study night, 45 awakenings (i.e. transitions to Stage W from any sleep stage), and 137.0 total stage transitions.  Wake after sleep onset (WASO) time accounted for 59 minutes.  AROUSAL: There were 20.1 arousals/hour.  Of these, 69 were identified as respiratory-related arousals (11.4 /h), 1 was PLM-related arousal (0.2 /h), and 75 were non-specific arousals (12.3 /h)  RESPIRATORY MONITORING:  Based on CMS criteria (using a 4% oxygen desaturation rule for scoring hypopneas), there were 81 apneas (23 obstructive; 40 central; 18 mixed), and 63 hypopneas.  Majority Central Apnea index was 13.3/h. Hypopnea index was 10.4/h. The apnea-hypopnea index (AHI)  was 23.7/h overall (23.2 supine, 10.9 non-supine; 4.1 REM, 0.0 supine REM). There were 0 respiratory effort-related arousals (RERAs).   OXIMETRY: Total sleep time spent at, or below 88% was 3.0 minutes, or 0.8% of total sleep time. Respiratory events were associated with oxyhemoglobin oxygen-saturation nadir during sleep at 85% from a mean of 95%.  BODY POSITION: Duration of total sleep and percent of total sleep in their respective position is as follows: supine 321 minutes (88%),  non-supine 43.5 minutes (12% was all right sided sleep 43 minutes (12%), Total supine REM sleep time was 18 minutes (62.7% of total REM sleep). LIMB MOVEMENTS: There were 9 periodic limb movements of sleep (1.5/h), of which 1 (0.2/h) was associated with an arousal. ECG: The electrocardiogram documented normal sinus rhythm.  The average heart rate during sleep was 53 bpm.  The maximum heart rate during sleep was 74 bpm and minimum  heart rate was 48 bpm .       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   1. BILEVEL titration :  Sleep-disordered breathing improved at Bilevel pressure of 24/ 20 cm water - ST 10 /minute .  This last setting achieved a high sleep efficiency, REM sleep and AHI of 0.0/h by CMS criteria.  Oxygen desaturation was controlled at that setting as well and no snoring was reported.  2. Total sleep time was within normal limits. The quality of  sleep improved with positive airway pressure.  3. No clinically significant periodic limb movements (PLMs) were observed.         RECOMMENDATION:  Therapy by BiPAP ST : The needed setting will be a pressure of  24/ 20 cm water  and ST at 10/min.  A Vitera FFM was used in S/M size.   Heated humidification will be provided.  Follow up with NP Slack after 70 days on new therapy.

## 2023-11-20 ENCOUNTER — Encounter: Payer: Self-pay | Admitting: Neurology

## 2023-11-21 NOTE — Telephone Encounter (Signed)
 Epic community msg sent to dme DME [employer]  [phone] MARKED HIGH PRIORITY

## 2023-11-21 NOTE — Telephone Encounter (Signed)
 I called the patient.  He is feeling terrible.  I pulled the download, does not look like his BiPAP settings have yet been changed to reflect BIPAP ST. Dr. Chalice placed orders last week 11/16/2023.  Currently having 33 apnea events (29 are central).  Can you please contact DME to see about getting these changes placed ASAP. Thanks!!

## 2023-11-22 ENCOUNTER — Ambulatory Visit (INDEPENDENT_AMBULATORY_CARE_PROVIDER_SITE_OTHER)

## 2023-11-22 ENCOUNTER — Ambulatory Visit
Admission: RE | Admit: 2023-11-22 | Discharge: 2023-11-22 | Disposition: A | Discharge status: Home / Self Care | Attending: Family Medicine | Admitting: Family Medicine

## 2023-11-22 VITALS — BP 141/94 | HR 75 | Temp 97.9°F | Resp 17

## 2023-11-22 DIAGNOSIS — M25512 Pain in left shoulder: Secondary | ICD-10-CM

## 2023-11-22 DIAGNOSIS — M25511 Pain in right shoulder: Secondary | ICD-10-CM

## 2023-11-22 DIAGNOSIS — M62838 Other muscle spasm: Secondary | ICD-10-CM

## 2023-11-22 DIAGNOSIS — M47812 Spondylosis without myelopathy or radiculopathy, cervical region: Secondary | ICD-10-CM

## 2023-11-22 MED ORDER — CELECOXIB 200 MG PO CAPS: 200.0000 mg | ORAL_CAPSULE | Freq: Every day | ORAL | 0 refills | Status: DC

## 2023-11-22 MED ORDER — BACLOFEN 10 MG PO TABS: 10.0000 mg | ORAL_TABLET | Freq: Three times a day (TID) | ORAL | 0 refills | Status: DC

## 2023-11-22 NOTE — ED Provider Notes (Addendum)
 [facility]    CSN: [id number] Arrival date & time: 11/22/23  0806      History   Chief Complaint Chief Complaint  Patient presents with   Shoulder Pain    bilateral    HPI Jeff Stewart is a 68 y.o. male.   HPI  Patient is here complaining of several months off-and-on of shoulder pain.  Worse the last couple of days.  Kept him awake last night.  He states that he saw his spine surgeon last month who stated the pain was not from his cervical spine, or cervical spine surgery.  He thinks that since his shoulder joints.  He points to his upper shoulder region and trapezius region.  States that hurts with overhead reach with shoulders or shrugging of shoulders.  No numbness or weakness into the arms. Patient is a chronic pain patient with a morphine pump He has cervical and degenerative disc disease and has had multiple surgeries Also arthritis and surgeries on knees.  No known arthritis in shoulders.  No trauma  Past Medical History:  Diagnosis Date   Anxiety    Carpal tunnel syndrome, bilateral    Cataract    removed years ago   Chronic back pain    internal morphine pump   DDD (degenerative disc disease)    neck, lumbar   Depression    Dyslipidemia    diet controlled   GERD (gastroesophageal reflux disease)    past hx- had nissen fundiplication    Hypertension    borderline   Sleep apnea    wears C-PAP   Status post insertion of spinal cord stimulator     Patient Active Problem List   Diagnosis Date Noted   Dependence on bilevel positive airway pressure (BiPAP) ventilation due to central sleep apnea 11/16/2023   Degenerative disc disease, cervical 08/07/2023   Degenerative disc disease, lumbar 08/07/2023   Lumbar post-laminectomy syndrome 08/07/2023   Neck pain 08/07/2023   Abnormality of gait and mobility 07/19/2023   Spinal stenosis, cervical region 06/20/2023   Treatment-emergent central sleep apnea 05/02/2023   Complex sleep apnea syndrome  05/02/2023   Intolerance of continuous positive airway pressure (CPAP) ventilation 10/06/2022   S/P UPPP (uvulopalatopharyngoplasty) 10/06/2022   Vitamin D  deficiency 12/07/2021   BMI 33.0-33.9,adult 11/13/2020   Primary localized osteoarthritis of knee 04/24/2018   Primary osteoarthritis of left knee 04/02/2018   OSA (obstructive sleep apnea) 04/02/2018   GERD (gastroesophageal reflux disease) 02/07/2018   Hypogonadism in male 04/03/2015   Essential hypertension, benign 03/04/2015   Anxiety and depression 02/23/2015   Chronic back pain 01/14/2015   History of back surgery 07/31/2014   Fatigue 10/21/2013   Vertigo 05/20/2013   Radicular pain of lower extremity 10/29/2012    Past Surgical History:  Procedure Laterality Date   BACK SURGERY     x 6   CARPAL TUNNEL RELEASE     bilateral   COLONOSCOPY     ELBOW SURGERY     INGUINAL HERNIA REPAIR Right    INTERNAL MORPHINE PUMP      KNEE ARTHROSCOPY     NASAL SEPTUM SURGERY     x 6   neck fusion   07/18/2023   neck surgery pe rpt   NISSEN FUNDOPLICATION     SKIN CANCER EXCISION     SPINAL CORD STIMULATOR INSERTION     SPINAL CORD STIMULATOR REMOVAL  02/2023   STOMACH SURGERY     Nissen Fundiplication   thumb surgery  TOTAL KNEE ARTHROPLASTY Left 04/24/2018   Procedure: TOTAL KNEE ARTHROPLASTY;  Surgeon: Beverley Evalene BIRCH, MD;  Location: [facility];  Service: Orthopedics;  Laterality: Left;   UPPER GASTROINTESTINAL ENDOSCOPY         Home Medications    Prior to Admission medications   Medication Sig Start Date End Date Taking? Authorizing Provider  baclofen  (LIORESAL ) 10 MG tablet Take 1 tablet (10 mg total) by mouth 3 (three) times daily. 11/22/23  Yes Maranda Jamee Jacob, MD  celecoxib  (CELEBREX ) 200 MG capsule Take 1 capsule (200 mg total) by mouth daily. 11/22/23  Yes Maranda Jamee Jacob, MD  acetaminophen  (TYLENOL ) 500 MG tablet Take 500 mg by mouth every 6 (six) hours as needed.    [provider]   Cholecalciferol (VITAMIN D ) 50 MCG (2000 UT) CAPS Take 2,000 Units by mouth daily with lunch.    [provider]  fluticasone  (FLONASE ) 50 MCG/ACT nasal spray Place 2 sprays into both nostrils daily. 04/06/20   Dettinger, Fonda LABOR, MD  furosemide  (LASIX ) 40 MG tablet Take 1 tablet (40 mg total) by mouth every morning. 10/18/23   Dettinger, Fonda LABOR, MD  hydrochlorothiazide  (HYDRODIURIL ) 25 MG tablet Take 1 tablet (25 mg total) by mouth daily. TAKE 1 TABLET DAILY. 10/18/23   Dettinger, Fonda LABOR, MD  losartan  (COZAAR ) 50 MG tablet Take 1 tablet (50 mg total) by mouth 2 (two) times daily. 10/18/23   Dettinger, Fonda LABOR, MD  ondansetron  (ZOFRAN -ODT) 4 MG disintegrating tablet Take 1 tablet (4 mg total) by mouth every 8 (eight) hours as needed for nausea or vomiting. 11/15/23   Dettinger, Fonda LABOR, MD  pantoprazole  (PROTONIX ) 40 MG tablet Take 1 tablet (40 mg total) by mouth daily. 10/18/23   Dettinger, Fonda LABOR, MD  sertraline  (ZOLOFT ) 100 MG tablet Take 1.5 tablets (150 mg total) by mouth daily. 10/18/23   Dettinger, Fonda LABOR, MD  Syringe/Needle, Disp, (SYRINGE 3CC/21GX1-1/4) 21G X 1-1/4 3 ML MISC 1 each by Does not apply route once a week. 07/10/20   Nida, Gebreselassie W, MD  testosterone  cypionate (DEPOTESTOSTERONE CYPIONATE) 200 MG/ML injection INJECT 0.5 MLS EVERY 10 DAYS. 09/25/23   Nida, Gebreselassie W, MD    Family History Family History  Problem Relation Age of Onset   CAD Father 24   Emphysema Father    Stroke Mother 17   CAD Brother 14       CABG   Alcohol abuse Brother    Hypertension Brother    Stroke Brother    CAD Brother    Cirrhosis Brother    Alcohol abuse Brother    Hypertension Brother    CAD Sister    Hypertension Sister    Hyperlipidemia Sister    Cancer Sister        melanoma   Cancer Sister    Lung cancer Sister    Cancer Brother    Lung cancer Brother    Cancer Brother    Lung cancer Brother    Colon cancer Neg Hx    Colon polyps Neg Hx    Esophageal  cancer Neg Hx    Rectal cancer Neg Hx    Stomach cancer Neg Hx     Social History Social History   Tobacco Use   Smoking status: Never   Smokeless tobacco: Never  Vaping Use   Vaping status: Never Used  Substance Use Topics   Alcohol use: No   Drug use: No     Allergies   Nalbuphine and  Nubain [nalbuphine hcl]   Review of Systems Review of Systems See HPI  Physical Exam Triage Vital Signs ED Triage Vitals [11/22/23 0816]  Encounter Vitals Group     BP (!) 141/94     Girls Systolic BP Percentile      Girls Diastolic BP Percentile      Boys Systolic BP Percentile      Boys Diastolic BP Percentile      Pulse Rate 75     Resp 17     Temp 97.9 F (36.6 C)     Temp Source Oral     SpO2 99 %     Weight      Height      Head Circumference      Peak Flow      Pain Score 10     Pain Loc      Pain Education      Exclude from Growth Chart    No data found.  Updated Vital Signs BP (!) 141/94 (BP Location: Right Arm)   Pulse 75   Temp 97.9 F (36.6 C) (Oral)   Resp 17   SpO2 99%      Physical Exam Constitutional:      General: He is not in acute distress.    Appearance: He is well-developed. He is ill-appearing.     Comments: Appears uncomfortable  HENT:     Head: Normocephalic and atraumatic.  Eyes:     Conjunctiva/sclera: Conjunctivae normal.     Pupils: Pupils are equal, round, and reactive to light.  Cardiovascular:     Rate and Rhythm: Normal rate.  Pulmonary:     Effort: Pulmonary effort is normal. No respiratory distress.  Abdominal:     General: There is no distension.     Palpations: Abdomen is soft.  Musculoskeletal:        General: Tenderness present. Normal range of motion.     Cervical back: Normal range of motion.     Comments: There is tightness and tenderness in both upper body of trapezius down the medial scapula.  Palpable muscle spasm.  Increased pain with shoulder shrug and movement of the neck.  No tenderness around shoulder  joint specifically although he does have limited motion bilaterally, cannot extend or abduct above 90 degrees and has pain with internal more than external rotation.  No neurovascular deficit in arms, normal reflexes and sensation  Skin:    General: Skin is warm.     Comments: Clammy skin  Neurological:     Mental Status: He is alert.      UC Treatments / Results  Labs (all labs ordered are listed, but only abnormal results are displayed) Labs Reviewed - No data to display  EKG   Radiology DG Shoulder Right Result Date: 11/22/2023 CLINICAL DATA:  Bilateral shoulder pain for several months without known injury. EXAM: RIGHT SHOULDER - 2+ VIEW COMPARISON:  None Available. FINDINGS: There is no evidence of fracture or dislocation. There is no evidence of arthropathy or other focal bone abnormality. Soft tissues are unremarkable. IMPRESSION: Negative. Electronically Signed   By: Lynwood Landy Raddle M.D.   On: 11/22/2023 09:07   DG Shoulder Left Result Date: 11/22/2023 CLINICAL DATA:  Bilateral shoulder pain for several months without known injury. EXAM: LEFT SHOULDER - 2+ VIEW COMPARISON:  None Available. FINDINGS: There is no evidence of fracture or dislocation. There is no evidence of arthropathy or other focal bone abnormality. Soft tissues are unremarkable. IMPRESSION: Negative.  Electronically Signed   By: Lynwood Landy Raddle M.D.   On: 11/22/2023 09:06    Procedures Procedures (including critical care time)  Medications Ordered in UC Medications - No data to display  Initial Impression / Assessment and Plan / UC Course  I have reviewed the triage vital signs and the nursing notes.  Pertinent labs & imaging results that were available during my care of the patient were reviewed by me and considered in my medical decision making (see chart for details).    I think the patient's problem is mostly his trapezius muscles.  It may be related to his chronic cervical pain, recent cervical  surgery.  I have recommended anti-inflammatories and muscle relaxant medication.  Ice or heat.  PT if fails to improve Normal x-ray results were discussed with the patient and wife Final Clinical Impressions(s) / UC Diagnoses   Final diagnoses:  Acute pain of both shoulders  Trapezius muscle spasm  Osteoarthritis of cervical spine, unspecified spinal osteoarthritis complication status     Discharge Instructions      Take the Celebrex  (celecoxib ) once a day with food.  This is an anti-inflammatory medication, nonsteroid Take the baclofen  up to 3 times a day as needed as a muscle relaxer.  This may cause drowsiness so be careful driving on baclofen  Call if not better in a few days and I will add physical therapy    ED Prescriptions     Medication Sig Dispense Auth. Provider   baclofen  (LIORESAL ) 10 MG tablet Take 1 tablet (10 mg total) by mouth 3 (three) times daily. 30 each Maranda Jamee Jacob, MD   celecoxib  (CELEBREX ) 200 MG capsule Take 1 capsule (200 mg total) by mouth daily. 10 capsule Maranda Jamee Jacob, MD      PDMP not reviewed this encounter.   Maranda Jamee Jacob, MD 11/22/23 1026    Maranda Jamee Jacob, MD 11/22/23 1027

## 2023-11-22 NOTE — ED Triage Notes (Addendum)
 Pt c/o bilateral shoulder intermittently x a few months. Denies injury. Constant dull ache, pain worse at times. Tylenol  prn. C spine fusion in April.

## 2023-11-22 NOTE — Discharge Instructions (Signed)
 Take the Celebrex  (celecoxib ) once a day with food.  This is an anti-inflammatory medication, nonsteroid Take the baclofen  up to 3 times a day as needed as a muscle relaxer.  This may cause drowsiness so be careful driving on baclofen  Call if not better in a few days and I will add physical therapy

## 2023-12-01 ENCOUNTER — Other Ambulatory Visit: Payer: Self-pay | Admitting: "Endocrinology

## 2023-12-01 DIAGNOSIS — E291 Testicular hypofunction: Secondary | ICD-10-CM

## 2023-12-03 ENCOUNTER — Other Ambulatory Visit: Payer: Self-pay

## 2023-12-03 ENCOUNTER — Ambulatory Visit: Payer: Self-pay

## 2023-12-03 ENCOUNTER — Ambulatory Visit
Admission: EM | Admit: 2023-12-03 | Discharge: 2023-12-03 | Disposition: A | Discharge status: Home / Self Care | Attending: Family Medicine | Admitting: Family Medicine

## 2023-12-03 DIAGNOSIS — G894 Chronic pain syndrome: Secondary | ICD-10-CM | POA: Diagnosis not present

## 2023-12-03 DIAGNOSIS — M4802 Spinal stenosis, cervical region: Secondary | ICD-10-CM | POA: Diagnosis not present

## 2023-12-03 DIAGNOSIS — R5382 Chronic fatigue, unspecified: Secondary | ICD-10-CM | POA: Diagnosis not present

## 2023-12-03 DIAGNOSIS — M629 Disorder of muscle, unspecified: Secondary | ICD-10-CM

## 2023-12-03 LAB — POCT FASTING CBG KUC MANUAL ENTRY: POCT Glucose (KUC): 101 mg/dL — AB (ref 70–99)

## 2023-12-03 MED ORDER — KETOROLAC TROMETHAMINE 30 MG/ML IJ SOLN
30.0000 mg | Freq: Once | INTRAMUSCULAR | Status: AC
  Administered 2023-12-03: 30 mg via INTRAMUSCULAR

## 2023-12-03 MED ORDER — PREGABALIN 75 MG PO CAPS: 75.0000 mg | ORAL_CAPSULE | Freq: Two times a day (BID) | ORAL | 0 refills | Status: DC

## 2023-12-03 NOTE — Discharge Instructions (Signed)
 I have prescribed Lyrica  (pregabalin ) to try for your restlessness.  You may take this twice a day If this helps you get refills from your usual primary care doctor  Your lab result will be available in MyChart.  You will be called if it is abnormal

## 2023-12-03 NOTE — ED Provider Notes (Signed)
 Jeff Stewart    CSN: [id number] Arrival date & time: 12/03/23  0920      History   Chief Complaint Chief Complaint  Patient presents with   Neck Injury   Fatigue    HPI Jeff Stewart is a 68 y.o. male.   Jeff Stewart has a medically complex patient with multiple orthopedic conditions, multiple back surgeries and neck surgery who suffers from chronic pain.  He has an implanted morphine pump.  He states that when the pain becomes severe he cannot sleep.  He currently states he has not slept for a couple of days.  This is making him feel restless and irritable, fatigued and weak.  He goes in cycles like this with frequent flares of severe pain.  He was seen here for a similar episode recently.  He also has severe central sleep apnea.  His BiPAP was recently calibrated on 11/13/2023.  He states now it so strong and feels like it will blow off my face.  He has pain and weakness all across his neck and shoulder girdle.  I looked in his chart and his sed rate was done in January, it occurs to me this could be a PMR.  Would repeat the sed rate today.  I think it is just just chronic pain syndrome, with a flare. Because of his morphine pump additional narcotics are not appropriate.  He is already on Celebrex  as an anti-inflammatory.  He has baclofen  to take as a muscle relaxer.  He does not tolerate gabapentin .  He took pregabalin  for short period of time and states this was helpful.  He is open to trying pregabalin  again.  He does have a pain specialty provider, neurologist, orthopedist, family doctor, and multiple specialties managing his chronic pain and sleep disorder.    Past Medical History:  Diagnosis Date   Anxiety    Carpal tunnel syndrome, bilateral    Cataract    removed years ago   Chronic back pain    internal morphine pump   DDD (degenerative disc disease)    neck, lumbar   Depression    Dyslipidemia    diet controlled   GERD (gastroesophageal reflux disease)     past hx- had nissen fundiplication    Hypertension    borderline   Sleep apnea    wears C-PAP   Status post insertion of spinal cord stimulator     Patient Active Problem List   Diagnosis Date Noted   Dependence on bilevel positive airway pressure (BiPAP) ventilation due to central sleep apnea 11/16/2023   Degenerative disc disease, cervical 08/07/2023   Degenerative disc disease, lumbar 08/07/2023   Lumbar post-laminectomy syndrome 08/07/2023   Neck pain 08/07/2023   Abnormality of gait and mobility 07/19/2023   Spinal stenosis, cervical region 06/20/2023   Treatment-emergent central sleep apnea 05/02/2023   Complex sleep apnea syndrome 05/02/2023   Intolerance of continuous positive airway pressure (CPAP) ventilation 10/06/2022   S/P UPPP (uvulopalatopharyngoplasty) 10/06/2022   Vitamin D  deficiency 12/07/2021   BMI 33.0-33.9,adult 11/13/2020   Primary localized osteoarthritis of knee 04/24/2018   Primary osteoarthritis of left knee 04/02/2018   OSA (obstructive sleep apnea) 04/02/2018   GERD (gastroesophageal reflux disease) 02/07/2018   Hypogonadism in male 04/03/2015   Essential hypertension, benign 03/04/2015   Anxiety and depression 02/23/2015   Chronic back pain 01/14/2015   History of back surgery 07/31/2014   Fatigue 10/21/2013   Vertigo 05/20/2013   Radicular pain of lower extremity 10/29/2012  Past Surgical History:  Procedure Laterality Date   BACK SURGERY     x 6   CARPAL TUNNEL RELEASE     bilateral   COLONOSCOPY     ELBOW SURGERY     INGUINAL HERNIA REPAIR Right    INTERNAL MORPHINE PUMP      KNEE ARTHROSCOPY     NASAL SEPTUM SURGERY     x 6   neck fusion   07/18/2023   neck surgery pe rpt   NISSEN FUNDOPLICATION     SKIN CANCER EXCISION     SPINAL CORD STIMULATOR INSERTION     SPINAL CORD STIMULATOR REMOVAL  02/2023   STOMACH SURGERY     Nissen Fundiplication   thumb surgery     TOTAL KNEE ARTHROPLASTY Left 04/24/2018   Procedure:  TOTAL KNEE ARTHROPLASTY;  Surgeon: Beverley Evalene BIRCH, MD;  Location: [facility];  Service: Orthopedics;  Laterality: Left;   UPPER GASTROINTESTINAL ENDOSCOPY         Home Medications    Prior to Admission medications   Medication Sig Start Date End Date Taking? Authorizing Provider  NON FORMULARY Morphine pain pump   Yes [provider]  pregabalin  (LYRICA ) 75 MG capsule Take 1 capsule (75 mg total) by mouth 2 (two) times daily. 12/03/23  Yes Maranda Jamee Jacob, MD  acetaminophen  (TYLENOL ) 500 MG tablet Take 500 mg by mouth every 6 (six) hours as needed.    [provider]  baclofen  (LIORESAL ) 10 MG tablet Take 1 tablet (10 mg total) by mouth 3 (three) times daily. 11/22/23   Maranda Jamee Jacob, MD  celecoxib  (CELEBREX ) 200 MG capsule Take 1 capsule (200 mg total) by mouth daily. 11/22/23   Maranda Jamee Jacob, MD  Cholecalciferol (VITAMIN D ) 50 MCG (2000 UT) CAPS Take 2,000 Units by mouth daily with lunch.    [provider]  fluticasone  (FLONASE ) 50 MCG/ACT nasal spray Place 2 sprays into both nostrils daily. 04/06/20   Dettinger, Fonda LABOR, MD  furosemide  (LASIX ) 40 MG tablet Take 1 tablet (40 mg total) by mouth every morning. 10/18/23   Dettinger, Fonda LABOR, MD  hydrochlorothiazide  (HYDRODIURIL ) 25 MG tablet Take 1 tablet (25 mg total) by mouth daily. TAKE 1 TABLET DAILY. 10/18/23   Dettinger, Fonda LABOR, MD  losartan  (COZAAR ) 50 MG tablet Take 1 tablet (50 mg total) by mouth 2 (two) times daily. 10/18/23   Dettinger, Fonda LABOR, MD  ondansetron  (ZOFRAN -ODT) 4 MG disintegrating tablet Take 1 tablet (4 mg total) by mouth every 8 (eight) hours as needed for nausea or vomiting. 11/15/23   Dettinger, Fonda LABOR, MD  pantoprazole  (PROTONIX ) 40 MG tablet Take 1 tablet (40 mg total) by mouth daily. 10/18/23   Dettinger, Fonda LABOR, MD  sertraline  (ZOLOFT ) 100 MG tablet Take 1.5 tablets (150 mg total) by mouth daily. 10/18/23   Dettinger, Fonda LABOR, MD  Syringe/Needle, Disp, (SYRINGE  3CC/21GX1-1/4) 21G X 1-1/4 3 ML MISC 1 each by Does not apply route once a week. 07/10/20   Nida, Gebreselassie W, MD  testosterone  cypionate (DEPOTESTOSTERONE CYPIONATE) 200 MG/ML injection INJECT 0.5 MLS ONCE WEEKLY 12/01/23   Nida, Gebreselassie W, MD    Family History Family History  Problem Relation Age of Onset   CAD Father 67   Emphysema Father    Stroke Mother 62   CAD Brother 82       CABG   Alcohol abuse Brother    Hypertension Brother    Stroke Brother    CAD  Brother    Cirrhosis Brother    Alcohol abuse Brother    Hypertension Brother    CAD Sister    Hypertension Sister    Hyperlipidemia Sister    Cancer Sister        melanoma   Cancer Sister    Lung cancer Sister    Cancer Brother    Lung cancer Brother    Cancer Brother    Lung cancer Brother    Colon cancer Neg Hx    Colon polyps Neg Hx    Esophageal cancer Neg Hx    Rectal cancer Neg Hx    Stomach cancer Neg Hx     Social History Social History   Tobacco Use   Smoking status: Never   Smokeless tobacco: Never  Vaping Use   Vaping status: Never Used  Substance Use Topics   Alcohol use: No   Drug use: No     Allergies   Nalbuphine and Nubain [nalbuphine hcl]   Review of Systems Review of Systems  See HPI Physical Exam Triage Vital Signs ED Triage Vitals  Encounter Vitals Group     BP 12/03/23 0924 (!) 188/96     Girls Systolic BP Percentile --      Girls Diastolic BP Percentile --      Boys Systolic BP Percentile --      Boys Diastolic BP Percentile --      Pulse Rate 12/03/23 0924 65     Resp 12/03/23 0924 16     Temp 12/03/23 0924 (!) 97.4 F (36.3 C)     Temp src --      SpO2 12/03/23 0924 99 %     Weight --      Height --      Head Circumference --      Peak Flow --      Pain Score 12/03/23 0925 10     Pain Loc --      Pain Education --      Exclude from Growth Chart --    No data found.  Updated Vital Signs BP (!) 188/96   Pulse 65   Temp (!) 97.4 F (36.3 C)    Resp 16   SpO2 99%       Physical Exam Constitutional:      General: He is in acute distress.     Appearance: He is well-developed. He is ill-appearing.  HENT:     Head: Normocephalic and atraumatic.  Eyes:     Conjunctiva/sclera: Conjunctivae normal.     Pupils: Pupils are equal, round, and reactive to light.  Neck:     Comments: Tenderness in the paraspinous cervical muscles on the upper body of the trapezius bilaterally.  No neurodeficits Cardiovascular:     Rate and Rhythm: Normal rate.     Heart sounds: Normal heart sounds.  Pulmonary:     Effort: Pulmonary effort is normal. No respiratory distress.     Breath sounds: Normal breath sounds.  Abdominal:     General: There is no distension.     Palpations: Abdomen is soft.  Musculoskeletal:        General: Normal range of motion.     Cervical back: Normal range of motion. Tenderness present.  Skin:    General: Skin is warm and dry.  Neurological:     Mental Status: He is alert.      UC Treatments / Results  Labs (all labs ordered are listed, but only abnormal  results are displayed) Labs Reviewed  POCT FASTING CBG [facility] MANUAL ENTRY - Abnormal; Notable for the following components:      Result Value   POCT Glucose ([facility]) 101 (*)    All other components within normal limits  SEDIMENTATION RATE    EKG   Radiology No results found.  Procedures Procedures (including critical Stewart time)  Medications Ordered in UC Medications  ketorolac  (TORADOL ) 30 MG/ML injection 30 mg (30 mg Intramuscular Given 12/03/23 0949)    Initial Impression / Assessment and Plan / UC Course  I have reviewed the triage vital signs and the nursing notes.  Pertinent labs & imaging results that were available during my Stewart of the patient were reviewed by me and considered in my medical decision making (see chart for details).     I explained to the patient that I have limited resources available to help him with his chronic pain  condition.  It is not safe for me to prescribe him a sedative given his morphine pump, which might help him sleep.  Not comfortable with benzodiazepines or any of the sleeping medications.  His wife states he will take a Tylenol  PM occasionally.  I believe it is safe to try pregabalin  again as this has helped him in the past.  I am also giving him a shot of Toradol  to help with his pain today.  Follow-up with usual providers Final Clinical Impressions(s) / UC Diagnoses   Final diagnoses:  Musculoskeletal disorder involving upper trapezius muscle  Chronic pain syndrome  Chronic fatigue     Discharge Instructions      I have prescribed Lyrica  (pregabalin ) to try for your restlessness.  You may take this twice a day If this helps you get refills from your usual primary Stewart doctor  Your lab result will be available in MyChart.  You will be called if it is abnormal   ED Prescriptions     Medication Sig Dispense Auth. Provider   pregabalin  (LYRICA ) 75 MG capsule Take 1 capsule (75 mg total) by mouth 2 (two) times daily. 60 capsule Maranda Jamee Jacob, MD      I have reviewed the PDMP during this encounter.   Maranda Jamee Jacob, MD 12/03/23 1020

## 2023-12-03 NOTE — ED Triage Notes (Addendum)
 Neck pain (x months), fatigue, feels like will pass out. Diaphoretic. Feels sick from hurting and not sleeping. Has not been eating much. No fever. Tylenol  prn.

## 2023-12-04 ENCOUNTER — Telehealth: Payer: Self-pay | Admitting: Neurology

## 2023-12-04 LAB — SEDIMENTATION RATE: Sed Rate: 22 mm/h (ref 0–30)

## 2023-12-04 NOTE — Telephone Encounter (Signed)
 Called and spoke to pt and relayed Jeff Stewart's information:  I reached out to the [employer] rep, Mitch to see about patient getting set up on the BiPAP ST. Per rep, set-ups can take up to 7-14 days. He is requesting that the set-up to be done urgently, since patient is having issues with current settings   Pt voiced gratitude and understanding

## 2023-12-04 NOTE — Telephone Encounter (Signed)
 Returned call to pt and he stated that since the pressure was increased he cannot tolerate it. Says its to much and he cannot breath and he stated that its flapping his jaws and bothering him. Pt would like the pressure decreased. Pt stated that he went to urgent care and they encouraged the pt to followup w/neurology

## 2023-12-04 NOTE — Telephone Encounter (Signed)
 Will ask sleep lab to take a look at download below.

## 2023-12-04 NOTE — Telephone Encounter (Signed)
 Pt wife called to see if Pt can be seen today due to not being able to sleep the patient wife stated Pt is having  a very hard time sleeping . Pt has not been sleeping since Thursday . Pt  is very nauseas and really can't use the CPAP machine due to not being able to sleep at night.Pt wife is requesting for PT to be seen sooner

## 2023-12-05 ENCOUNTER — Telehealth: Payer: Self-pay

## 2023-12-05 NOTE — Telephone Encounter (Signed)
/  Lvm 1st attempt by hf 12/05/23

## 2023-12-05 NOTE — Telephone Encounter (Signed)
 He can check with his providers if his pregabalin  and/or baclofen  may be increased at bedtime which may help him rest better at night.  He could also add up to 10 mg of melatonin at night.

## 2023-12-05 NOTE — Telephone Encounter (Signed)
 Patient's wife, Jeff Stewart called he needs something done. He is not able sleep and so exhausted mentally and physical, not eating; can not get any rest. Would like a call back to see what can be done.

## 2023-12-05 NOTE — Telephone Encounter (Signed)
 Call to patient, no answer. Left messae that [employer] was trying to get him set up  by the end of the week and to return call if needed.

## 2023-12-05 NOTE — Telephone Encounter (Signed)
 Called and spoke to pt wife and stated that we are at the mercy of the dme and they will be reaching out, they are at the mercy of insurance. Pt would like medication to help him sleep. I stated that I would reach out to provider and ask. I explained that they could pay out of pocket and possibly get it sooner. The pt wife voiced understanding and will be expecting a call back

## 2023-12-05 NOTE — Telephone Encounter (Signed)
 Spoke with mitchell cain, DME rep and he states they are waiting on insurance and their goal for set up is end of week.

## 2023-12-05 NOTE — Telephone Encounter (Signed)
 Jeff Stewart asking what can do until they call us  to setup. He is so exhausted from walking the floors. Would like a call back.

## 2023-12-18 ENCOUNTER — Telehealth: Payer: Self-pay

## 2023-12-18 ENCOUNTER — Encounter: Payer: Self-pay | Admitting: Neurology

## 2023-12-18 NOTE — Telephone Encounter (Signed)
 Spoke w/Pt regarding BiPAP. Pt stated he is feeling better but has had issues with his mask leaking. Pt stated he has reached out to the DME provider and they are working with him on a new mask. Discussed his initial f/u visit and scheduled him for Dec 4 at 8:30 AM w/Dr. Chalice as requested by NP. Reminded Pt to bring his BiPAP and power cord to the appt. Pt voiced understanding and thanks for the call.

## 2023-12-27 ENCOUNTER — Other Ambulatory Visit

## 2023-12-29 DIAGNOSIS — M48062 Spinal stenosis, lumbar region with neurogenic claudication: Secondary | ICD-10-CM | POA: Diagnosis not present

## 2023-12-29 DIAGNOSIS — M419 Scoliosis, unspecified: Secondary | ICD-10-CM | POA: Diagnosis not present

## 2023-12-29 DIAGNOSIS — M5416 Radiculopathy, lumbar region: Secondary | ICD-10-CM | POA: Diagnosis not present

## 2024-01-04 LAB — CBC WITH DIFFERENTIAL/PLATELET
Basophils Absolute: 0 x10E3/uL (ref 0.0–0.2)
Basos: 1 %
EOS (ABSOLUTE): 0.2 x10E3/uL (ref 0.0–0.4)
Eos: 3 %
Hematocrit: 38.5 % (ref 37.5–51.0)
Hemoglobin: 12.5 g/dL — ABNORMAL LOW (ref 13.0–17.7)
Immature Grans (Abs): 0 x10E3/uL (ref 0.0–0.1)
Immature Granulocytes: 0 %
Lymphocytes Absolute: 1.1 x10E3/uL (ref 0.7–3.1)
Lymphs: 18 %
MCH: 29.2 pg (ref 26.6–33.0)
MCHC: 32.5 g/dL (ref 31.5–35.7)
MCV: 90 fL (ref 79–97)
Monocytes Absolute: 0.6 x10E3/uL (ref 0.1–0.9)
Monocytes: 10 %
Neutrophils Absolute: 4.2 x10E3/uL (ref 1.4–7.0)
Neutrophils: 68 %
Platelets: 167 x10E3/uL (ref 150–450)
RBC: 4.28 x10E6/uL (ref 4.14–5.80)
RDW: 13.3 % (ref 11.6–15.4)
WBC: 6.2 x10E3/uL (ref 3.4–10.8)

## 2024-01-04 LAB — FIB-4 W/REFLEX TO ELF
ALT: 15 IU/L (ref 0–44)
AST: 15 IU/L (ref 0–40)
FIB-4 Index: 1.62 (ref 0.00–2.67)

## 2024-01-04 LAB — TESTOSTERONE, FREE, TOTAL, SHBG
Sex Hormone Binding: 19.3 nmol/L (ref 19.3–76.4)
Testosterone, Free: 15 pg/mL (ref 6.6–18.1)
Testosterone: 514 ng/dL (ref 264–916)

## 2024-01-04 LAB — ENHANCED LIVER FIBROSIS (ELF): ELF(TM) Score: 10.24 — ABNORMAL HIGH (ref <9.80)

## 2024-01-04 LAB — PSA: Prostate Specific Ag, Serum: 1.2 ng/mL (ref 0.0–4.0)

## 2024-01-15 NOTE — Telephone Encounter (Signed)
 Lauraine- what do you recommend?

## 2024-01-16 NOTE — Telephone Encounter (Signed)
 Spoke w/ Lauraine. If pt replies and would like to come in, ok to offer appt if there is an opening.

## 2024-01-17 NOTE — Telephone Encounter (Signed)
 Pt called back appt Scheduled  10:30 - 8:15

## 2024-01-17 NOTE — Telephone Encounter (Signed)
 Done

## 2024-01-18 ENCOUNTER — Ambulatory Visit: Admitting: Neurology

## 2024-01-18 ENCOUNTER — Encounter: Payer: Self-pay | Admitting: Neurology

## 2024-01-18 VITALS — BP 153/83 | HR 65 | Ht 73.0 in | Wt 258.0 lb

## 2024-01-18 DIAGNOSIS — G4739 Other sleep apnea: Secondary | ICD-10-CM

## 2024-01-18 NOTE — Patient Instructions (Signed)
 I lowered your BiPAP pressure to see if it will help with the mask leaking.  If you have any issues please contact me next week.  We may need to further reduce.  Keep appointment in December with Dr. Chalice.  Thanks!!

## 2024-01-18 NOTE — Progress Notes (Signed)
 Patient: Jeff Stewart Date of Birth: February 02, 1956  Reason for Visit: Follow up History from: Patient Primary Neurologist: Dohmeier   ASSESSMENT AND PLAN 68 y.o. year old male   1.  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 AHI Much improved at 25, was 52 in July 2025 on BIPAP -Mask leak 77, DME suggested turning down pressure, currently at 24/20, will reduce to 22/18, may need to further reduce to 20/16 (I spoke with Jeff Stewart about this) -Reach out to me next week if not tolerable  -Keep appointment with Jeff Stewart in December   BIPAP titration 10/04/23 IMPRESSION: Severe Central Dominant Sleep Apnea responded to high pressure BiPAP therapy with ST back up rate of 10/min.   1. BILEVEL titration :  Sleep-disordered breathing improved at Bilevel pressure of 24/ 20 cm water - ST 10 /minute .  This last setting achieved a high sleep efficiency, REM sleep and AHI of 0.0/h by CMS criteria.  Oxygen desaturation was controlled at that setting as well and no snoring was reported.  2. Total sleep time was within normal limits. The quality of  sleep improved with positive airway pressure.  3. No clinically significant periodic limb movements (PLMs) were observed.  HISTORY OF PRESENT ILLNESS: Today 01/18/24 Update 01/18/24 SS: Using FFM size large. Download shows 100% compliance, 24/20 cm water , RR 10.Leak 77, AHI 25.7. Feels like a new person! Much more energy, sleep is more restorative! Would like to discuss mask leaking, wakes him up. Using FFM large. DME suggested turning down the pressure. Tried medium mask, still leaked. Overall feeling 100% better. BIPAP ST setup 12/13/23.   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  Using the BiPAP caused more central apnea to emerge with an AHI of over 30.  Plan to return for BiPAP-ST-ASV titration.  He had BiPAP  titration 05/15/23 with pressure change from 15/10 cm to 20/16 cm, AHI improved to 1.8/h.  Using AirFit F40 fullface mask size medium.  Here today for follow-up.  On BiPAP 20/16 cm.  100% compliance, leak 54.7, AHI 52 (central 38.8, obstructive 3.1, unknown 9.7).  Feeling terrible, he is tired, can hardly make himself get out of the house.  Feels tired all the time. Does heating and air conditioner part time. RLS is under good control. Had cervical fusion in April, may need lumbar fusion. Reviewed sleep report with sleep lab manager. ESS 18.  HISTORY  05/02/23 Jeff Stewart: Jeff Stewart is a 68 y.o. male patient who is here for revisit 05/02/2023 for  BiPAP  therapy review. .  Chief concern according to patient :   on a morphine  pump-phonic severe pain   Jeff Stewart has a history of pain, failed back surgeries, and he underwent a split-night study here after being diagnosed with severe apnea and a home sleep test in summer 2024.  The split-night titration went on to provide BiPAP at 18 over 14 cm water  and a BiPAP was written for 20 over 16 cm water  hoping that this would eliminate at least all obstructive apneas.  This indeed seems to have happened but the central apneas remain and have actually increased.  So this patient started office complex sleep apnea and the central component is probably opioid induced, however his AHI has remained very high and all central while on BiPAP 20 over 16 cm water .  I will ask the patient to return for an in-lab BiPAP ST or ASV titration now.   On December 6 th, he had his spinal card stimulator removed and soo after returned for in lab titration.    In addition, he has had nights of not sleeping at all is very exhausted and has actually been seen in the emergency room several times.   Jeff Stewart read  the preceding baseline study  in my absence- and ordered the return to full night PAP titration.Jeff Stewart is a 68 year-old patient who had years ago been  diagnosed with OSA, was unable to tolerate CPAP and had undergone several ENT surgeries. 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  HST was recently done somewhere ( not included in his referral ) and he had seen ENT Jeff Stewart but failed the endoscopy for [facility] -   He reports excessive daytime sleepiness and is willing to try other modalities than CPAP if this should still not be tolerated well.  The Epworth Sleepiness Scale was 23 /24 (scores above or equal to 10 are suggestive of hypersomnolence).   REVIEW OF SYSTEMS: Out of a complete 14 system review of symptoms, the patient complains only of the following symptoms, and all other reviewed systems are negative.  See HPI  ALLERGIES: Allergies  Allergen Reactions   Nalbuphine Nausea And Vomiting, Rash and Shortness Of Breath   Nubain [Nalbuphine Hcl] Rash    HOME MEDICATIONS: Outpatient Medications Prior to Visit  Medication Sig Dispense Refill   acetaminophen  (TYLENOL ) 500 MG tablet Take 500 mg by mouth every 6 (six) hours as needed.     celecoxib  (CELEBREX ) 200 MG capsule Take 1 capsule (200 mg total) by mouth daily. 10 capsule 0   Cholecalciferol (VITAMIN D ) 50 MCG (2000 UT) CAPS Take 2,000 Units by mouth daily with lunch.     fluticasone  (FLONASE ) 50 MCG/ACT nasal spray Place 2 sprays into both nostrils daily. 16 g 11   furosemide  (LASIX ) 40 MG tablet Take 1 tablet (40 mg total) by mouth every morning. 90 tablet 3   hydrochlorothiazide  (HYDRODIURIL ) 25 MG tablet Take 1 tablet (25 mg total) by mouth daily. TAKE 1 TABLET DAILY. 90 tablet 3   losartan  (COZAAR ) 50 MG tablet Take 1 tablet (50 mg total) by mouth 2 (two) times daily. 180 tablet 3   NON FORMULARY Morphine pain pump     pantoprazole  (PROTONIX ) 40 MG tablet Take 1 tablet (40 mg total) by mouth daily. 90 tablet 3   sertraline  (ZOLOFT ) 100 MG tablet Take 1.5 tablets (150 mg total) by mouth daily. 135 tablet 3    Syringe/Needle, Disp, (SYRINGE 3CC/21GX1-1/4) 21G X 1-1/4 3 ML MISC 1 each by Does not apply route once a week. 100 each 0   testosterone  cypionate (DEPOTESTOSTERONE CYPIONATE) 200 MG/ML injection INJECT 0.5 MLS ONCE WEEKLY 10 mL 0   baclofen  (LIORESAL ) 10 MG tablet Take 1 tablet (10 mg total) by mouth 3 (three) times daily. 30 each 0   ondansetron  (ZOFRAN -ODT) 4 MG disintegrating tablet Take 1 tablet (4 mg total) by mouth every 8 (eight) hours as needed for nausea or vomiting. 30 tablet 0   pregabalin  (LYRICA ) 75 MG capsule Take 1 capsule (75 mg total) by mouth 2 (two) times daily. 60 capsule 0   No facility-administered medications prior to visit.    PAST MEDICAL HISTORY: Past Medical History:  Diagnosis Date   Anxiety  Carpal tunnel syndrome, bilateral    Cataract    removed years ago   Chronic back pain    internal morphine pump   DDD (degenerative disc disease)    neck, lumbar   Depression    Dyslipidemia    diet controlled   GERD (gastroesophageal reflux disease)    past hx- had nissen fundiplication    Hypertension    borderline   Sleep apnea    wears C-PAP   Status post insertion of spinal cord stimulator     PAST SURGICAL HISTORY: Past Surgical History:  Procedure Laterality Date   BACK SURGERY     x 6   CARPAL TUNNEL RELEASE     bilateral   COLONOSCOPY     ELBOW SURGERY     INGUINAL HERNIA REPAIR Right    INTERNAL MORPHINE PUMP      KNEE ARTHROSCOPY     NASAL SEPTUM SURGERY     x 6   neck fusion   07/18/2023   neck surgery pe rpt   NISSEN FUNDOPLICATION     SKIN CANCER EXCISION     SPINAL CORD STIMULATOR INSERTION     SPINAL CORD STIMULATOR REMOVAL  02/2023   STOMACH SURGERY     Nissen Fundiplication   thumb surgery     TOTAL KNEE ARTHROPLASTY Left 04/24/2018   Procedure: TOTAL KNEE ARTHROPLASTY;  Surgeon: Beverley Evalene BIRCH, MD;  Location: [facility];  Service: Orthopedics;  Laterality: Left;   UPPER GASTROINTESTINAL ENDOSCOPY      FAMILY  HISTORY: Family History  Problem Relation Age of Onset   CAD Father 51   Emphysema Father    Stroke Mother 40   CAD Brother 55       CABG   Alcohol abuse Brother    Hypertension Brother    Stroke Brother    CAD Brother    Cirrhosis Brother    Alcohol abuse Brother    Hypertension Brother    CAD Sister    Hypertension Sister    Hyperlipidemia Sister    Cancer Sister        melanoma   Cancer Sister    Lung cancer Sister    Cancer Brother    Lung cancer Brother    Cancer Brother    Lung cancer Brother    Colon cancer Neg Hx    Colon polyps Neg Hx    Esophageal cancer Neg Hx    Rectal cancer Neg Hx    Stomach cancer Neg Hx     SOCIAL HISTORY: Social History   Socioeconomic History   Marital status: Married    Spouse name: Not on file   Number of children: 3   Years of education: Not on file   Highest education level: GED or equivalent  Occupational History   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  Tobacco Use   Smoking status: Never   Smokeless tobacco: Never  Vaping Use   Vaping status: Never Used  Substance and Sexual Activity   Alcohol use: No   Drug use: No   Sexual activity: Not Currently    Birth control/protection: Post-menopausal    Comment: married for 1972  Other Topics Concern   Not on file  Social History Narrative   Lives with wife    Children live nearby   Retired    Social Drivers of Health   Financial Resource Strain: Low Risk  (10/14/2023)   Overall Financial Resource Strain (CARDIA)  Difficulty of Paying Living Expenses: Not hard at all  Food Insecurity: No Food Insecurity (10/14/2023)   Hunger Vital Sign    Worried About Running Out of Food in the Last Year: Never true    Ran Out of Food in the Last Year: Never true  Transportation Needs: No Transportation Needs (10/14/2023)   PRAPARE - Administrator, Civil Service (Medical): No    Lack of Transportation (Non-Medical): No  Physical Activity: Inactive  (10/14/2023)   Exercise Vital Sign    Days of Exercise per Week: 0 days    Minutes of Exercise per Session: Not on file  Stress: No Stress Concern Present (10/14/2023)   [employer] of Occupational Health - Occupational Stress Questionnaire    Feeling of Stress: Not at all  Social Connections: Moderately Integrated (10/14/2023)   Social Connection and Isolation Panel    Frequency of Communication with Friends and Family: More than three times a week    Frequency of Social Gatherings with Friends and Family: More than three times a week    Attends Religious Services: More than 4 times per year    Active Member of [employer] or Organizations: No    Attends Engineer, Structural: Not on file    Marital Status: Married  Recent Concern: Social Connections - Moderately Isolated (08/07/2023)   Social Connection and Isolation Panel    Frequency of Communication with Friends and Family: Once a week    Frequency of Social Gatherings with Friends and Family: Once a week    Attends Religious Services: More than 4 times per year    Active Member of [employer] or Organizations: No    Attends Banker Meetings: Never    Marital Status: Married  Catering Manager Violence: Not At Risk (08/07/2023)   Humiliation, Afraid, Rape, and Kick questionnaire    Fear of Current or Ex-Partner: No    Emotionally Abused: No    Physically Abused: No    Sexually Abused: No   PHYSICAL EXAM  Vitals:   01/18/24 0820  BP: (!) 153/83  Pulse: 65  Weight: 258 lb (117 kg)  Height: 6' 1 (1.854 m)   Body mass index is 34.04 kg/m.  Generalized: Well developed, in no acute distress  Neurological examination  Mentation: Alert oriented to time, place, history taking. Follows all commands speech and language fluent Cranial nerve II-XII: Pupils were equal round reactive to light. Extraocular movements were full, visual field were full on confrontational test. Facial sensation and strength were normal. Head  turning and shoulder shrug  were normal and symmetric. Motor: The motor testing reveals 5 over 5 strength of all 4 extremities. Good symmetric motor tone is noted throughout.  Gait and station: Gait is normal.   DIAGNOSTIC DATA (LABS, IMAGING, TESTING) - I reviewed patient records, labs, notes, testing and imaging myself where available.  Lab Results  Component Value Date   WBC 6.2 12/27/2023   HGB 12.5 (L) 12/27/2023   HCT 38.5 12/27/2023   MCV 90 12/27/2023   PLT 167 12/27/2023      Component Value Date/Time   NA 141 11/08/2023 0924   K 3.8 11/08/2023 0924   CL 97 11/08/2023 0924   CO2 28 11/08/2023 0924   GLUCOSE 89 11/08/2023 0924   GLUCOSE 100 (H) 05/06/2023 0032   BUN 19 11/08/2023 0924   CREATININE 1.09 11/08/2023 0924   CALCIUM 9.6 11/08/2023 0924   PROT 6.6 11/08/2023 0924   ALBUMIN 4.5 11/08/2023  0924   AST 15 12/27/2023 0904   ALT 15 12/27/2023 0904   ALKPHOS 97 11/08/2023 0924   BILITOT 0.4 11/08/2023 0924   GFRNONAA >60 05/06/2023 0032   GFRAA 95 10/30/2019 0836   Lab Results  Component Value Date   CHOL 165 10/18/2023   HDL 46 10/18/2023   LDLCALC 104 (H) 10/18/2023   TRIG 80 10/18/2023   CHOLHDL 3.6 10/18/2023   Lab Results  Component Value Date   HGBA1C 5.5 09/25/2023   Lab Results  Component Value Date   VITAMINB12 275 09/10/2021   Lab Results  Component Value Date   TSH 2.230 11/08/2023   Lauraine Born, AGNP-C, DNP 01/18/2024, 8:24 AM [facility] [address], [address] [place], [place] [address] [phone]

## 2024-01-29 ENCOUNTER — Telehealth: Payer: Self-pay | Admitting: Neurology

## 2024-01-29 DIAGNOSIS — G4739 Other sleep apnea: Secondary | ICD-10-CM

## 2024-01-29 NOTE — Telephone Encounter (Signed)
 Sent community msg to dme DME [employer]  [phone]

## 2024-01-29 NOTE — Telephone Encounter (Signed)
 I turned the pressure down first to see if that would help the mask leaking. Check with the patient to see if he has noticed any benefit?   I looked at a download, the last few nights, the leak has been much better.   I am happy to go ahead and order mask refit to see if anything is better for him. He did mention, he previously tried a smaller mask and it still leaked.   Download shows leak is still 78. AHI is 20.  I will send him a my chart message. I also ordered a mask refit, please send to DME. Thanks!!

## 2024-01-29 NOTE — Telephone Encounter (Signed)
 Pt called to follow up about Order for new mask and CPAP supplies .Jeff Stewart Pt stated he had spoke to  Np however when Pt called DME  they have not received anything  from  Office . Pt wanted to follow up to see if he can get that order  sent over

## 2024-01-29 NOTE — Telephone Encounter (Signed)
 Lauraine- you last saw pt 01/18/24. I do not see mask/supplies order. Did you want to place and we can forward to DME?

## 2024-01-30 ENCOUNTER — Encounter: Payer: Self-pay | Admitting: "Endocrinology

## 2024-01-30 ENCOUNTER — Ambulatory Visit: Admitting: "Endocrinology

## 2024-01-30 VITALS — BP 130/62 | HR 72 | Ht 73.0 in | Wt 254.8 lb

## 2024-01-30 DIAGNOSIS — K760 Fatty (change of) liver, not elsewhere classified: Secondary | ICD-10-CM

## 2024-01-30 DIAGNOSIS — E291 Testicular hypofunction: Secondary | ICD-10-CM

## 2024-01-30 DIAGNOSIS — F1090 Alcohol use, unspecified, uncomplicated: Secondary | ICD-10-CM | POA: Insufficient documentation

## 2024-01-30 DIAGNOSIS — E559 Vitamin D deficiency, unspecified: Secondary | ICD-10-CM

## 2024-01-30 MED ORDER — TESTOSTERONE CYPIONATE 200 MG/ML IM SOLN: INTRAMUSCULAR | 0 refills | Status: AC

## 2024-01-30 NOTE — Telephone Encounter (Signed)
 Community message sent via epic to verify correct setting per Lauraine NP

## 2024-01-30 NOTE — Telephone Encounter (Signed)
 I called the patient. I changed his pressure at last visit from 24/20 to 22/18 due to high leak. I see on download this is correct pressure. Mask still leaking using FFM size large. He has a size medium, will try that. Can we send message to DME to ensure they have correct pressure settings reflected on their end 22/18? Thanks

## 2024-01-30 NOTE — Progress Notes (Signed)
 01/30/2024                   Endocrinology follow-up note  Subjective:    Patient ID: Jeff Stewart, male    DOB: 1955/09/29, PCP Dettinger, Fonda LABOR, MD   Past Medical History:  Diagnosis Date   Anxiety    Carpal tunnel syndrome, bilateral    Cataract    removed years ago   Chronic back pain    internal morphine pump   DDD (degenerative disc disease)    neck, lumbar   Depression    Dyslipidemia    diet controlled   GERD (gastroesophageal reflux disease)    past hx- had nissen fundiplication    Hypertension    borderline   Sleep apnea    wears C-PAP   Status post insertion of spinal cord stimulator    Past Surgical History:  Procedure Laterality Date   BACK SURGERY     x 6   CARPAL TUNNEL RELEASE     bilateral   COLONOSCOPY     ELBOW SURGERY     INGUINAL HERNIA REPAIR Right    INTERNAL MORPHINE PUMP      KNEE ARTHROSCOPY     NASAL SEPTUM SURGERY     x 6   neck fusion   07/18/2023   neck surgery pe rpt   NISSEN FUNDOPLICATION     SKIN CANCER EXCISION     SPINAL CORD STIMULATOR INSERTION     SPINAL CORD STIMULATOR REMOVAL  02/2023   STOMACH SURGERY     Nissen Fundiplication   thumb surgery     TOTAL KNEE ARTHROPLASTY Left 04/24/2018   Procedure: TOTAL KNEE ARTHROPLASTY;  Surgeon: Beverley Jeff BIRCH, MD;  Location: [facility];  Service: Orthopedics;  Laterality: Left;   UPPER GASTROINTESTINAL ENDOSCOPY     Social History   Socioeconomic History   Marital status: Married    Spouse name: Not on file   Number of children: 3   Years of education: Not on file   Highest education level: GED or equivalent  Occupational History   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  Tobacco Use   Smoking status: Never   Smokeless tobacco: Never  Vaping Use   Vaping status: Never Used  Substance and Sexual Activity   Alcohol use: No   Drug use: No   Sexual activity: Not Currently    Birth control/protection: Post-menopausal    Comment: married for 1972   Other Topics Concern   Not on file  Social History Narrative   Lives with wife    Children live nearby   Retired    Social Drivers of Corporate Investment Banker Strain: Low Risk  (10/14/2023)   Overall Financial Resource Strain (CARDIA)    Difficulty of Paying Living Expenses: Not hard at all  Food Insecurity: No Food Insecurity (10/14/2023)   Hunger Vital Sign    Worried About Running Out of Food in the Last Year: Never true    Ran Out of Food in the Last Year: Never true  Transportation Needs: No Transportation Needs (10/14/2023)   PRAPARE - Administrator, Civil Service (Medical): No    Lack of Transportation (Non-Medical): No  Physical Activity: Inactive (10/14/2023)   Exercise Vital Sign    Days of Exercise per Week: 0 days    Minutes of Exercise per Session: Not on file  Stress: No Stress Concern Present (10/14/2023)   [employer] of Occupational  Health - Occupational Stress Questionnaire    Feeling of Stress: Not at all  Social Connections: Moderately Integrated (10/14/2023)   Social Connection and Isolation Panel    Frequency of Communication with Friends and Family: More than three times a week    Frequency of Social Gatherings with Friends and Family: More than three times a week    Attends Religious Services: More than 4 times per year    Active Member of [employer] or Organizations: No    Attends Engineer, Structural: Not on file    Marital Status: Married  Recent Concern: Social Connections - Moderately Isolated (08/07/2023)   Social Connection and Isolation Panel    Frequency of Communication with Friends and Family: Once a week    Frequency of Social Gatherings with Friends and Family: Once a week    Attends Religious Services: More than 4 times per year    Active Member of [employer] or Organizations: No    Attends Banker Meetings: Never    Marital Status: Married   Outpatient Encounter Medications as of 01/30/2024  Medication Sig    acetaminophen  (TYLENOL ) 500 MG tablet Take 500 mg by mouth every 6 (six) hours as needed.   celecoxib  (CELEBREX ) 200 MG capsule Take 1 capsule (200 mg total) by mouth daily.   Cholecalciferol (VITAMIN D ) 50 MCG (2000 UT) CAPS Take 2,000 Units by mouth daily with lunch.   fluticasone  (FLONASE ) 50 MCG/ACT nasal spray Place 2 sprays into both nostrils daily.   furosemide  (LASIX ) 40 MG tablet Take 1 tablet (40 mg total) by mouth every morning.   hydrochlorothiazide  (HYDRODIURIL ) 25 MG tablet Take 1 tablet (25 mg total) by mouth daily. TAKE 1 TABLET DAILY.   losartan  (COZAAR ) 50 MG tablet Take 1 tablet (50 mg total) by mouth 2 (two) times daily.   NON FORMULARY Morphine pain pump   pantoprazole  (PROTONIX ) 40 MG tablet Take 1 tablet (40 mg total) by mouth daily.   sertraline  (ZOLOFT ) 100 MG tablet Take 1.5 tablets (150 mg total) by mouth daily.   Syringe/Needle, Disp, (SYRINGE 3CC/21GX1-1/4) 21G X 1-1/4 3 ML MISC 1 each by Does not apply route once a week.   testosterone  cypionate (DEPOTESTOSTERONE CYPIONATE) 200 MG/ML injection INJECT 0.5 MLS EVERY 10 DAYS   [DISCONTINUED] testosterone  cypionate (DEPOTESTOSTERONE CYPIONATE) 200 MG/ML injection INJECT 0.5 MLS ONCE WEEKLY   No facility-administered encounter medications on file as of 01/30/2024.   ALLERGIES: Allergies  Allergen Reactions   Nalbuphine Nausea And Vomiting, Rash and Shortness Of Breath   Nubain [Nalbuphine Hcl] Rash   VACCINATION STATUS: Immunization History  Administered Date(s) Administered   Fluad Quad(high Dose 65+) 12/21/2018, 12/22/2020, 01/07/2022   Fluad Trivalent(High Dose 65+) 02/13/2023   Influenza Inj Mdck Quad Pf 02/24/2020   Influenza Split 01/19/2014   Influenza,inj,Quad PF,6+ Mos 01/14/2015, 02/02/2016, 02/08/2018   Influenza-Unspecified 02/19/2014, 02/24/2020   Moderna Sars-Covid-2 Vaccination 05/12/2019, 05/23/2019, 03/10/2020   Pneumococcal Conjugate-13 08/09/2019   Pneumococcal Polysaccharide-23  04/23/2020   Tdap 12/11/2014   Zoster Recombinant(Shingrix) 04/23/2020, 10/08/2020    HPI [name] is a 68 year old gentleman with a medical history as above.  He is being seen in follow-up for hypogonadism on testosterone  replacement therapy.   -He remains on testosterone  cypionate .  He is currently on testosterone  100 mg IM every 10 days.  His previsit labs show total testosterone  at 514 need treatment cycle.  His PSA is favorable at 1.2.  He has no new complaints.  He was known to  have low testosterone  for at least since age 56.   No evidence of adverse effects from testosterone  treatment.  He wishes to be continued on testosterone  placement therapy. 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  He did not have evaluation by gastroenterology in the past. He fathers 3 grown children. He denies history of head injury , has had nasal septal surgery multiple times. He denies injury to the testicles, exposure to chemotherapy, exposure to radiation to the genitals. However, due to chronic back injury and surgery he has exposure to heavy opioid therapy for pain control. He is currently on morphine pump. He has been off and  on  opioids for pain control for the last 10-15 years.  He struggles with sleep apnea, even changing CPAP machines did not help much.  Review of Systems Limited as above.  Objective:    BP 130/62   Pulse 72   Ht 6' 1 (1.854 m)   Wt 254 lb 12.8 oz (115.6 kg)   BMI 33.62 kg/m   Wt Readings from Last 3 Encounters:  01/30/24 254 lb 12.8 oz (115.6 kg)  01/18/24 258 lb (117 kg)  11/08/23 249 lb (112.9 kg)      From previous exam.  Genitourinary system: He has bilaterally shrunk testicles, right testes 12 mL, left testes 15 mL. No scrotal mass, no varicocele,  no inguinal lymphadenopathy.    Recent Results (from the past 2160 hours)  CBC with Differential/Platelet     Status: None   Collection Time: 11/08/23  9:24 AM  Result Value Ref Range   WBC 5.0 3.4 - 10.8  x10E3/uL   RBC 4.93 4.14 - 5.80 x10E6/uL   Hemoglobin 14.1 13.0 - 17.7 g/dL   Hematocrit 55.4 62.4 - 51.0 %   MCV 90 79 - 97 fL   MCH 28.6 26.6 - 33.0 pg   MCHC 31.7 31.5 - 35.7 g/dL   RDW 87.1 88.3 - 84.5 %   Platelets 215 150 - 450 x10E3/uL   Neutrophils 52 Not Estab. %   Lymphs 31 Not Estab. %   Monocytes 12 Not Estab. %   Eos 4 Not Estab. %   Basos 1 Not Estab. %   Neutrophils Absolute 2.6 1.4 - 7.0 x10E3/uL   Lymphocytes Absolute 1.5 0.7 - 3.1 x10E3/uL   Monocytes Absolute 0.6 0.1 - 0.9 x10E3/uL   EOS (ABSOLUTE) 0.2 0.0 - 0.4 x10E3/uL   Basophils Absolute 0.0 0.0 - 0.2 x10E3/uL   Immature Granulocytes 0 Not Estab. %   Immature Grans (Abs) 0.0 0.0 - 0.1 x10E3/uL  CMP14+EGFR     Status: None   Collection Time: 11/08/23  9:24 AM  Result Value Ref Range   Glucose 89 70 - 99 mg/dL   BUN 19 8 - 27 mg/dL   Creatinine, Ser 8.90 0.76 - 1.27 mg/dL   eGFR 73 >40 fO/fpw/8.26   BUN/Creatinine Ratio 17 10 - 24   Sodium 141 134 - 144 mmol/L   Potassium 3.8 3.5 - 5.2 mmol/L   Chloride 97 96 - 106 mmol/L   CO2 28 20 - 29 mmol/L   Calcium 9.6 8.6 - 10.2 mg/dL   Total Protein 6.6 6.0 - 8.5 g/dL   Albumin 4.5 3.9 - 4.9 g/dL   Globulin, Total 2.1 1.5 - 4.5 g/dL   Bilirubin Total 0.4 0.0 - 1.2 mg/dL   Alkaline Phosphatase 97 44 - 121 IU/L   AST 17 0 - 40 IU/L   ALT 13 0 - 44  IU/L  TSH     Status: None   Collection Time: 11/08/23  9:24 AM  Result Value Ref Range   TSH 2.230 0.450 - 4.500 uIU/mL  Urinalysis, Complete     Status: Abnormal   Collection Time: 11/08/23  9:30 AM  Result Value Ref Range   Specific Gravity, UA <1.005 (L) 1.005 - 1.030   pH, UA 6.0 5.0 - 7.5   Color, UA Yellow Yellow   Appearance Ur Clear Clear   Leukocytes,UA Negative Negative   Protein,UA Negative Negative/Trace   Glucose, UA Negative Negative   Ketones, UA Negative Negative   RBC, UA Negative Negative   Bilirubin, UA Negative Negative   Urobilinogen, Ur 0.2 0.2 - 1.0 mg/dL   Nitrite, UA Negative  Negative   Microscopic Examination Comment     Comment: Microscopic not indicated and not performed.  Urine Culture     Status: None   Collection Time: 11/08/23  9:48 AM   Specimen: Urine   UR  Result Value Ref Range   Urine Culture, Routine Final report    Organism ID, Bacteria Comment     Comment: Culture shows less than 10,000 colony forming units of bacteria per milliliter of urine. This colony count is not generally considered to be clinically significant.   POCT Finger stick Glucose     Status: Abnormal   Collection Time: 12/03/23  9:30 AM  Result Value Ref Range   POCT Glucose (KUC) 101 (A) 70 - 99 mg/dL  Sedimentation rate     Status: None   Collection Time: 12/03/23  9:50 AM  Result Value Ref Range   Sed Rate 22 0 - 30 mm/hr  Testosterone , Free, Total, SHBG     Status: None   Collection Time: 12/27/23  9:04 AM  Result Value Ref Range   Testosterone  514 264 - 916 ng/dL    Comment: Adult male reference interval is based on a population of healthy nonobese males (BMI <30) between 48 and 70 years old. Travison, et.al. JCEM [phone]. PMID: [id number].    Testosterone , Free 15.0 6.6 - 18.1 pg/mL   Sex Hormone Binding 19.3 19.3 - 76.4 nmol/L  PSA     Status: None   Collection Time: 12/27/23  9:04 AM  Result Value Ref Range   Prostate Specific Ag, Serum 1.2 0.0 - 4.0 ng/mL    Comment: Roche ECLIA methodology. According to the American Urological Association, Serum PSA should decrease and remain at undetectable levels after radical prostatectomy. The AUA defines biochemical recurrence as an initial PSA value 0.2 ng/mL or greater followed by a subsequent confirmatory PSA value 0.2 ng/mL or greater. Values obtained with different assay methods or kits cannot be used interchangeably. Results cannot be interpreted as absolute evidence of the presence or absence of malignant disease.   CBC with Differential/Platelet     Status: Abnormal   Collection Time: 12/27/23   9:04 AM  Result Value Ref Range   WBC 6.2 3.4 - 10.8 x10E3/uL   RBC 4.28 4.14 - 5.80 x10E6/uL   Hemoglobin 12.5 (L) 13.0 - 17.7 g/dL   Hematocrit 61.4 62.4 - 51.0 %   MCV 90 79 - 97 fL   MCH 29.2 26.6 - 33.0 pg   MCHC 32.5 31.5 - 35.7 g/dL   RDW 86.6 88.3 - 84.5 %   Platelets 167 150 - 450 x10E3/uL   Neutrophils 68 Not Estab. %   Lymphs 18 Not Estab. %   Monocytes 10 Not Estab. %  Eos 3 Not Estab. %   Basos 1 Not Estab. %   Neutrophils Absolute 4.2 1.4 - 7.0 x10E3/uL   Lymphocytes Absolute 1.1 0.7 - 3.1 x10E3/uL   Monocytes Absolute 0.6 0.1 - 0.9 x10E3/uL   EOS (ABSOLUTE) 0.2 0.0 - 0.4 x10E3/uL   Basophils Absolute 0.0 0.0 - 0.2 x10E3/uL   Immature Granulocytes 0 Not Estab. %   Immature Grans (Abs) 0.0 0.0 - 0.1 x10E3/uL  FIB-4 W/REFLEX TO ELF     Status: None   Collection Time: 12/27/23  9:04 AM  Result Value Ref Range   AST 15 0 - 40 IU/L   ALT 15 0 - 44 IU/L   FIB-4 Index 1.62 0.00 - 2.67    Comment:       0.00 - 1.29 Low risk for advanced liver fibrosis       1.30 - 2.67 Indeterminate risk for advanced liver                   fibrosis             >2.67 High risk for advanced fibrosis and                   for the development of other liver                   related events   Enhanced Liver Fibrosis (ELF)     Status: Abnormal   Collection Time: 12/27/23  9:04 AM  Result Value Ref Range   ELF(TM) Score 10.24 (H) <9.80    Comment: ELF(TM) Score Interpretation: Risk cut-offs to assess the likelihood of progression to cirrhosis and liver-related clinical events within 3.9 years following baseline ELF score (IQR: 14.0-22.4 months)*:                           Lower risk             < 9.80                           Mid risk         9.80 - 11.29                           Higher risk            >11.29 Note: The ELF(TM) Score is a unitless numerical value. Ponce SA, Wong VW, [place] T, et al. Selonsertib for patients with bridging fibrosis or compensated cirrhosis due  to NASH: Results from randomized phase III STELLAR trials. J Hepatol. 2020 Jul;73(1):26-39.      Assessment & Plan:   1. Hypogonadism  2.  Obesity/sleep apnea  3.  Vitamin D  deficiency.  -Testosterone  replacement helped improve his testosterone  from 74 ->286 -> 782 -> 509 > 978 > 659-> 437 >494> 327-> 506-> 640 -> 673 -> 529 -> 446 -> 340 -> 488 ->325 -> 700 -> 135 -> 228 -> 624 -> 475-> 797-> 97-> 514  -Gonadotropins levels indicate a component of secondary hypogonadism given his low normal LH of 1.6 (normal 1.5-9.3) .   -It is possible that it is multifactorial including exposure to heavy opioids, multiple back surgeries, prior inguinal surgery, or idiopathic.  -Prolactin level is favorable at 6.1, he will not need pituitary/sella imaging for now.  -He has benefited from testosterone  replacement therapy and he wishes  to be continued on treatment.     - He returns with improving PSA  of 1.2 ng/mL dropping from  1.9, and his total testosterone  at target of 514 ng per DL.   - He will be continued on the same dose testosterone   100 mg IM every 10 days to give him a total of 300 mg monthly.   Target testosterone  level 300-600 mcg/dL.  - His A1c is 5.5%, indicating absence of diabetes/prediabetes. - He would benefit from evaluation by gastroenterology in relation to high liver fibrosis score and high ELF indicative of high risk for MASLD.  He is also overdue for his colonoscopy.  -He we will return in 26-months with total and free testosterone  measurements.    Regarding his sleep apnea: He will benefit the most from weight loss.  He is encouraged to stay on change lifestyle nutrition.  - he acknowledges that there is a room for improvement in his food and drink choices. - Suggestion is made for him to avoid simple carbohydrates  from his diet including Cakes, Sweet Desserts, Ice Cream, Soda (diet and regular), Sweet Tea, Candies, Chips, Cookies, Store Bought Juices, Alcohol in Excess of  1-2  drinks a day, Artificial Sweeteners,  Coffee Creamer, and Sugar-free Products, Lemonade. This will help patient  avoid unintended weight gain.  He is advised to continue vitamin D  supplement 2000 units daily. His POC A1c was 5.5%.   - I advised patient to maintain close follow up with Dettinger, Fonda LABOR, MD for primary care needs.    I spent  25  minutes in the care of the patient today including review of labs from Thyroid  Function, CMP, and other relevant labs ; imaging/biopsy records (current and previous including abstractions from other facilities); face-to-face time discussing  his lab results and symptoms, medications doses, his options of short and long term treatment based on the latest standards of care / guidelines;   and documenting the encounter.  Jeff Stewart  participated in the discussions, expressed understanding, and voiced agreement with the above plans.  All questions were answered to his satisfaction. he is encouraged to contact clinic should he have any questions or concerns prior to his return visit.    Follow up plan: Return in about 4 months (around 05/29/2024) for Fasting Labs  in AM B4 8.  Ranny Earl, MD Phone: [phone]  Fax: [phone]   This note was partially dictated with voice recognition software. Similar sounding words can be transcribed inadequately or may not  be corrected upon review.  01/30/2024, 1:58 PM

## 2024-02-01 ENCOUNTER — Encounter: Payer: Self-pay | Admitting: Gastroenterology

## 2024-02-22 ENCOUNTER — Encounter: Payer: Self-pay | Admitting: Neurology

## 2024-02-22 ENCOUNTER — Ambulatory Visit: Admitting: Neurology

## 2024-02-22 VITALS — BP 135/73 | HR 63 | Ht 73.0 in | Wt 255.0 lb

## 2024-02-22 DIAGNOSIS — G4731 Primary central sleep apnea: Secondary | ICD-10-CM

## 2024-02-22 DIAGNOSIS — M4802 Spinal stenosis, cervical region: Secondary | ICD-10-CM

## 2024-02-22 DIAGNOSIS — Z789 Other specified health status: Secondary | ICD-10-CM

## 2024-02-22 DIAGNOSIS — Z9989 Dependence on other enabling machines and devices: Secondary | ICD-10-CM

## 2024-02-22 DIAGNOSIS — Z9889 Other specified postprocedural states: Secondary | ICD-10-CM | POA: Diagnosis not present

## 2024-02-22 DIAGNOSIS — R040 Epistaxis: Secondary | ICD-10-CM | POA: Insufficient documentation

## 2024-02-22 NOTE — Progress Notes (Signed)
 Provider:  Dedra Gores, MD  Primary Care Physician:  Dettinger, Fonda LABOR, MD [address] [place] [address]     Referring Provider: Dettinger, Fonda LABOR, Md [address] [place],  [place] [address]          Chief Complaint according to patient   Patient presents with:                HISTORY OF PRESENT ILLNESS:   Patient is here with wife for BiPAP follow-up - mask has been leaking would like to lower the pressure ESS - 16 FSS - 57  Jeff Stewart is a 68 y.o. male patient who is here for revisit 02/22/2024 for  BIPAP follow up.  Jeff Stewart has made multiple attempts to find the correct mask for him and he struggles with high air leaks partially due to him being edentulous he does have dentures and he has to wear them now at night to have more structure to use a fullface mask he also has a chinstrap and we may want to try a chinstrap with a nasal mask in spite of his high BiPAP pressures to see if he can tolerate that better the nasal airflow however has to be patent he has to have good air passage through the nose to make it tolerable and in the past he did have nosebleeds but these were related to high blood pressure over 160 systolic at the time that he experienced epistaxis.  So his minute ventilation is pretty good he is usually about 10 breaths a minute the IE ratio is normal, his respiratory rate is around 16 breaths a minute and his tidal volume is usually around 800.  I like for him to have his pressure reduced from 22/18 to 20 over 16 cm water  today and just see if this also helps with the air leak and in the lab he used an Ivor a fullface mask or Vitera fullface mask and small to medium size and currently he is using a full size large fullface mask.  I think he can get by with a medium size.  His sleepiness scale is reduced the Epworth score was 16 the fatigue score was 57 but there is a significant deficit that the patient and his wife have noticed after an poor  night or not using BiPAP so he is truly doing better when BiPAP works for him and when he can use it for a full night.  Blood pressure here today was 135/73 and I am happy with that.    Chief concern according to patient :  BiPAP airleak,   that's creating a high AHI due to an air leak.       Last BIPAP titration recommendation:   Vitera FFM S/M  An Airfit F 40 FFM in medium size  was used, allowing titration to aBiPAP pressure from 15/ 10 cm water  through 20/ 16 cm water .  AHI was 1.8/h by CMS criteria.   1. BILEVEL / ASV / AVAPS  Sleep-disordered breathing improved at the recommended pressure of 20/16  cmH2O. Hypoxemia was resolved with therapy.      BiPAP  therapy review. .  Chief concern according to patient :   on a morphine  pump-phonic severe pain     Jeff Stewart has a history of pain, failed back surgeries, and he underwent a split-night study here after being diagnosed with severe apnea and a home sleep  test in summer 2024.  The split-night titration went on to provide BiPAP at 18 over 14 cm water  and a BiPAP was written for 20 over 16 cm water  hoping that this would eliminate at least all obstructive apneas.  This indeed seems to have happened but the central apneas remain and have actually increased.  So this patient started office complex sleep apnea and the central component is probably opioid induced, however his AHI has remained very high and all central while on BiPAP 20 over 16 cm water .  I will ask the patient to return for an in-lab BiPAP ST or ASV titration now.   On December 6 th, he had his spinal card stimulator removed and soo after returned for in lab titration.    In addition, he has had nights of not sleeping at all is very exhausted and has actually been seen in the emergency room several times.       Dr Buck read  the preceding baseline study  in my absence- and ordered the return to full night PAP titration.Jeff Stewart is a 68 year-old patient who had  years ago been diagnosed with OSA, was unable to tolerate CPAP and had undergone several ENT surgeries. 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  HST was recently done somewhere ( not included in his referral ) and he had seen ENT Dr Carlie but failed the endoscopy for [facility] -   He reports excessive daytime sleepiness and is willing to try other modalities than CPAP if this should still not be tolerated well.  The Epworth Sleepiness Scale was 23 /24 (scores above or equal to 10 are suggestive of hypersomnolence).   He is status post nasal septoplasty,  UPPP, cervical fusion, back surgery dr Gust, chronic pain in therapy at [facility] . All specialists through [facility] , Primary Care through [facility] Dr Dettinger.    I have only the patients verbal report as to having had a BIPAP machine and tried several masks in the year 2014. He has complex apnea, central sleep apnea.    He is now presenting after a PSG diagnosed with severe complex sleep apnea and started on auto PAP, this in return caused more central apnea to emerge to an AHI of over 30/h. He returned for an in lab titration and ended up responding somewhat to 18/ 14 cm water , but  now shows central apneas emerging further while OSA is treated.    Insomnia is improved     Left nasion is obstructed, He is not an inspire candidate by his own recollection after seeing ENT Dr Carlie.  Review of Systems: Out of a complete 14 system review, the patient complains of only the following symptoms, and all other reviewed systems are negative.:   SLEEPINESS ?  How likely are you to doze in the following situations: 0 = not likely, 1 = slight chance, 2 = moderate chance, 3 = high chance  Sitting and Reading? Watching Television? Sitting inactive in a public place (theater or meeting)? Lying down in the afternoon when circumstances permit? Sitting and talking to someone? Sitting  quietly after lunch without alcohol? In a car, while stopped for a few minutes in traffic? As a passenger in a car for an hour without a break?  Total = 16 FSS at 57       Social History   Socioeconomic History   Marital status: Married  Spouse name: Not on file   Number of children: 3   Years of education: Not on file   Highest education level: GED or equivalent  Occupational History   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  Tobacco Use   Smoking status: Never   Smokeless tobacco: Never  Vaping Use   Vaping status: Never Used  Substance and Sexual Activity   Alcohol use: No   Drug use: No   Sexual activity: Not Currently    Birth control/protection: Post-menopausal    Comment: married for 1972  Other Topics Concern   Not on file  Social History Narrative   Lives with wife    Children live nearby   Retired    1-2 cup of coffee    Social Drivers of Health   Financial Resource Strain: Low Risk  (10/14/2023)   Overall Financial Resource Strain (CARDIA)    Difficulty of Paying Living Expenses: Not hard at all  Food Insecurity: No Food Insecurity (10/14/2023)   Hunger Vital Sign    Worried About Running Out of Food in the Last Year: Never true    Ran Out of Food in the Last Year: Never true  Transportation Needs: No Transportation Needs (10/14/2023)   PRAPARE - Administrator, Civil Service (Medical): No    Lack of Transportation (Non-Medical): No  Physical Activity: Inactive (10/14/2023)   Exercise Vital Sign    Days of Exercise per Week: 0 days    Minutes of Exercise per Session: Not on file  Stress: No Stress Concern Present (10/14/2023)   [employer] of Occupational Health - Occupational Stress Questionnaire    Feeling of Stress: Not at all  Social Connections: Moderately Integrated (10/14/2023)   Social Connection and Isolation Panel    Frequency of Communication with Friends and Family: More than three times a week    Frequency  of Social Gatherings with Friends and Family: More than three times a week    Attends Religious Services: More than 4 times per year    Active Member of [employer] or Organizations: No    Attends Engineer, Structural: Not on file    Marital Status: Married  Recent Concern: Social Connections - Moderately Isolated (08/07/2023)   Social Connection and Isolation Panel    Frequency of Communication with Friends and Family: Once a week    Frequency of Social Gatherings with Friends and Family: Once a week    Attends Religious Services: More than 4 times per year    Active Member of [employer] or Organizations: No    Attends Banker Meetings: Never    Marital Status: Married    Family History  Problem Relation Age of Onset   Stroke Mother 32   CAD Father 72   Emphysema Father    CAD Sister    Hypertension Sister    Hyperlipidemia Sister    Cancer Sister        melanoma   Cancer Sister    Lung cancer Sister    CAD Brother 44       CABG   Alcohol abuse Brother    Hypertension Brother    Stroke Brother    CAD Brother    Cirrhosis Brother    Alcohol abuse Brother    Hypertension Brother    Cancer Brother    Lung cancer Brother    Cancer Brother    Lung cancer Brother    Migraines Other  Colon cancer Neg Hx    Colon polyps Neg Hx    Esophageal cancer Neg Hx    Rectal cancer Neg Hx    Stomach cancer Neg Hx    Seizures Neg Hx     Past Medical History:  Diagnosis Date   Anxiety    Carpal tunnel syndrome, bilateral    Cataract    removed years ago   Chronic back pain    internal morphine pump   DDD (degenerative disc disease)    neck, lumbar   Depression    Dyslipidemia    diet controlled   GERD (gastroesophageal reflux disease)    past hx- had nissen fundiplication    Hypertension    borderline   Sleep apnea    wears C-PAP   Status post insertion of spinal cord stimulator     Past Surgical History:  Procedure Laterality Date   BACK SURGERY      x 6   CARPAL TUNNEL RELEASE     bilateral   COLONOSCOPY     ELBOW SURGERY     INGUINAL HERNIA REPAIR Right    INTERNAL MORPHINE PUMP      KNEE ARTHROSCOPY     NASAL SEPTUM SURGERY     x 6   neck fusion   07/18/2023   neck surgery pe rpt   NISSEN FUNDOPLICATION     SKIN CANCER EXCISION     SPINAL CORD STIMULATOR INSERTION     SPINAL CORD STIMULATOR REMOVAL  02/2023   STOMACH SURGERY     Nissen Fundiplication   thumb surgery     TOTAL KNEE ARTHROPLASTY Left 04/24/2018   Procedure: TOTAL KNEE ARTHROPLASTY;  Surgeon: Beverley Evalene BIRCH, MD;  Location: [facility];  Service: Orthopedics;  Laterality: Left;   UPPER GASTROINTESTINAL ENDOSCOPY       Current Outpatient Medications on File Prior to Visit  Medication Sig Dispense Refill   acetaminophen  (TYLENOL ) 500 MG tablet Take 500 mg by mouth every 6 (six) hours as needed.     celecoxib  (CELEBREX ) 200 MG capsule Take 1 capsule (200 mg total) by mouth daily. (Patient not taking: Reported on 02/22/2024) 10 capsule 0   Cholecalciferol (VITAMIN D ) 50 MCG (2000 UT) CAPS Take 2,000 Units by mouth daily with lunch.     fluticasone  (FLONASE ) 50 MCG/ACT nasal spray Place 2 sprays into both nostrils daily. 16 g 11   furosemide  (LASIX ) 40 MG tablet Take 1 tablet (40 mg total) by mouth every morning. (Patient not taking: Reported on 02/22/2024) 90 tablet 3   hydrochlorothiazide  (HYDRODIURIL ) 25 MG tablet Take 1 tablet (25 mg total) by mouth daily. TAKE 1 TABLET DAILY. 90 tablet 3   losartan  (COZAAR ) 50 MG tablet Take 1 tablet (50 mg total) by mouth 2 (two) times daily. 180 tablet 3   NON FORMULARY Morphine pain pump     pantoprazole  (PROTONIX ) 40 MG tablet Take 1 tablet (40 mg total) by mouth daily. 90 tablet 3   sertraline  (ZOLOFT ) 100 MG tablet Take 1.5 tablets (150 mg total) by mouth daily. 135 tablet 3   Syringe/Needle, Disp, (SYRINGE 3CC/21GX1-1/4) 21G X 1-1/4 3 ML MISC 1 each by Does not apply route once a week. 100 each 0   testosterone  cypionate  (DEPOTESTOSTERONE CYPIONATE) 200 MG/ML injection INJECT 0.5 MLS EVERY 10 DAYS 10 mL 0   No current facility-administered medications on file prior to visit.    Allergies  Allergen Reactions   Nalbuphine Nausea And Vomiting, Rash and Shortness  Of Breath   Nubain [Nalbuphine Hcl] Rash     DIAGNOSTIC DATA (LABS, IMAGING, TESTING) - I reviewed patient records, labs, notes, testing and imaging myself where available.  Lab Results  Component Value Date   WBC 6.2 12/27/2023   HGB 12.5 (L) 12/27/2023   HCT 38.5 12/27/2023   MCV 90 12/27/2023   PLT 167 12/27/2023      Component Value Date/Time   NA 141 11/08/2023 0924   K 3.8 11/08/2023 0924   CL 97 11/08/2023 0924   CO2 28 11/08/2023 0924   GLUCOSE 89 11/08/2023 0924   GLUCOSE 100 (H) 05/06/2023 0032   BUN 19 11/08/2023 0924   CREATININE 1.09 11/08/2023 0924   CALCIUM 9.6 11/08/2023 0924   PROT 6.6 11/08/2023 0924   ALBUMIN 4.5 11/08/2023 0924   AST 15 12/27/2023 0904   ALT 15 12/27/2023 0904   ALKPHOS 97 11/08/2023 0924   BILITOT 0.4 11/08/2023 0924   GFRNONAA >60 05/06/2023 0032   GFRAA 95 10/30/2019 0836   Lab Results  Component Value Date   CHOL 165 10/18/2023   HDL 46 10/18/2023   LDLCALC 104 (H) 10/18/2023   TRIG 80 10/18/2023   CHOLHDL 3.6 10/18/2023   Lab Results  Component Value Date   HGBA1C 5.5 09/25/2023   Lab Results  Component Value Date   VITAMINB12 275 09/10/2021   Lab Results  Component Value Date   TSH 2.230 11/08/2023    PHYSICAL EXAM:  Vitals:   02/22/24 0759  BP: 135/73  Pulse: 63  SpO2: 98%   No data found. Body mass index is 33.64 kg/m.   Wt Readings from Last 3 Encounters:  02/22/24 255 lb (115.7 kg)  01/30/24 254 lb 12.8 oz (115.6 kg)  01/18/24 258 lb (117 kg)     Ht Readings from Last 3 Encounters:  02/22/24 6' 1 (1.854 m)  01/30/24 6' 1 (1.854 m)  01/18/24 6' 1 (1.854 m)      General: The patient is awake, alert and appears not in acute distress and  groomed. Head: Normocephalic, atraumatic.  Neck is supple. Mallampati  UPPP ,  neck circumference:19.5 inches . Nasal airflow not fully patent.  left is occluded . Retrognathia is not seen.  Dental status:  dentures  Cardiovascular:  Regular rate and cardiac rhythm by pulse,  without distended neck veins. Respiratory: Lungs are clear to auscultation.  Skin:  Without evidence of ankle edema, or rash. Trunk: The patient's posture is erect.   NEUROLOGIC EXAM: The patient is awake and alert, oriented to place and time.   Memory subjective described as intact.  Attention span & concentration ability appears limited - he can't remember dates and places of medical procedures.  Speech is fluent,  without  dysarthria, dysphonia or aphasia.  Mood and affect are appropriate.   Cranial nerves: no loss of smell or taste reported  Pupils are equal and briskly reactive to light. Funduscopic exam deferred..  Extraocular movements in vertical and horizontal planes were intact and without nystagmus. No Diplopia. Visual fields by finger perimetry are intact. Hearing was intact to soft voice and finger rubbing.    Facial sensation intact to fine touch.  Facial motor strength is symmetric and tongue and uvula move midline.  Neck ROM : rotation, tilt and flexion extension were normal for age and shoulder shrug was symmetrical.    Motor exam:  Symmetric bulk, tone and ROM.   Normal tone without cog wheeling, symmetric grip strength . ASSESSMENT AND  PLAN :   68 y.o. year old male  here with: COMPLEX SLEEP APNEA on BIPAP, FFM , edentulous.     1) BIPAP problems, I think its a mask fit issue rather than a pressure issue, he is on a morphine pump that's creating his central apnea   2) dentures have to stay in at night -   3)  nasal spray to use for better airflow, flonase  and saline nasal spray .  4) chin strap - can help to reduce oral air outflow.   5) chronic pain, ENT and back problems.    DME  order to reduce airflow 20-16 cm water  easy breathe on and try chin strap.  Realistic Goal is an AHI of 10 or less.   Rv in 10 months with NP.   I would like to thank  Dettinger, Fonda LABOR, Md [address] [place],  [place] [address] for allowing me to meet with this pleasant patient.   Sleep Clinic Patients are generally offered input on sleep hygiene, life style changes and how to improve compliance with medical treatment where applicable. Review and reiteration of good sleep hygiene measures is offered to any sleep clinic patient, be it in the first consultation or with any follow up visits.    Any patient with sleepiness should be cautioned not to drive, work at heights, or operate dangerous or heavy equipment when feeling tired or sleepy.      The patient will be seen in follow-up in the sleep clinic at [facility] for discussion of test results, sleep related symptoms and treatment compliance review, further management strategies, etc.   The referring provider will be notified of the test results.   The patient's condition requires frequent monitoring and adjustments in the treatment plan, reflecting the ongoing complexity of care.  This provider is the continuing focal point for all needed services for this condition.  After spending a total time of  35  minutes face to face and time for  history taking, physical and neurologic examination, review of laboratory studies,  personal review of imaging studies, reports and results of other testing and review of referral information / records as far as provided in visit,   Electronically signed by: Dedra Gores, MD 02/22/2024 8:53 AM  [facility] Board certified by The [employer] of Sleep Medicine and Diplomate of the [employer] of Sleep Medicine. Board certified In Neurology through the ABPN, Fellow of the [employer] of Neu[employer].

## 2024-02-22 NOTE — Patient Instructions (Signed)
 ASSESSMENT AND PLAN :    68 y.o. year old male  here with: COMPLEX SLEEP APNEA on BIPAP, FFM , edentulous.      1) BIPAP problems, I think its a mask fit issue rather than a pressure issue, he is on a morphine pump that's creating his central apnea    2) dentures have to stay in at night -    3)  nasal spray to use for better airflow, flonase  and saline nasal spray .   4) chin strap - can help to reduce oral air outflow.   5) chronic pain, ENT and back problems.      DME order to reduce airflow 20-16 cm water  easy breathe on and try chin strap.   Realistic Goal is an AHI of 10 or less.    Rv in 10 months with NP.

## 2024-03-01 ENCOUNTER — Encounter: Payer: Self-pay | Admitting: Neurology

## 2024-03-01 DIAGNOSIS — Z9889 Other specified postprocedural states: Secondary | ICD-10-CM

## 2024-03-01 DIAGNOSIS — Z9289 Personal history of other medical treatment: Secondary | ICD-10-CM

## 2024-03-01 DIAGNOSIS — M4802 Spinal stenosis, cervical region: Secondary | ICD-10-CM

## 2024-03-01 DIAGNOSIS — G8929 Other chronic pain: Secondary | ICD-10-CM

## 2024-03-01 DIAGNOSIS — Z789 Other specified health status: Secondary | ICD-10-CM

## 2024-03-01 DIAGNOSIS — G4719 Other hypersomnia: Secondary | ICD-10-CM

## 2024-03-02 ENCOUNTER — Ambulatory Visit
Admission: RE | Admit: 2024-03-02 | Discharge: 2024-03-02 | Disposition: A | Discharge status: Home / Self Care | Source: Ambulatory Visit | Attending: Family Medicine | Admitting: Family Medicine

## 2024-03-02 ENCOUNTER — Other Ambulatory Visit: Payer: Self-pay

## 2024-03-02 VITALS — BP 150/90 | HR 90 | Temp 98.1°F | Resp 17

## 2024-03-02 DIAGNOSIS — G4700 Insomnia, unspecified: Secondary | ICD-10-CM | POA: Diagnosis not present

## 2024-03-02 MED ORDER — ESZOPICLONE 2 MG PO TABS: 2.0000 mg | ORAL_TABLET | Freq: Every evening | ORAL | 0 refills | Status: DC | PRN

## 2024-03-02 MED ORDER — ONDANSETRON 4 MG PO TBDP: 4.0000 mg | ORAL_TABLET | Freq: Three times a day (TID) | ORAL | 0 refills | Status: AC | PRN

## 2024-03-02 NOTE — ED Provider Notes (Signed)
 [facility]   [id number] 03/02/24 Arrival Time: 1328  ASSESSMENT & PLAN:  1. Insomnia, unspecified type    Long h/o fatigue and intermittent insomnia. Not sure I have much to offer him in addition to what his primary team of providers have evaluated. He just wants to get some sleep.  Trial of: Meds ordered this encounter  Medications   eszopiclone  (LUNESTA ) 2 MG TABS tablet    Sig: Take 1 tablet (2 mg total) by mouth at bedtime as needed for sleep. Take immediately before bedtime and do not use more than two nights per week    Dispense:  10 tablet    Refill:  0     Follow-up Information     Schedule an appointment as soon as possible for a visit  with Dettinger, Fonda LABOR, MD.   Specialties: Family Medicine, Cardiology Contact information: [address] [place] [address] [phone]                Does suffer with nausea and diarrhea frequently. At request: Meds ordered this encounter  Medications   ondansetron  (ZOFRAN -ODT) 4 MG disintegrating tablet    Sig: Take 1 tablet (4 mg total) by mouth every 8 (eight) hours as needed for nausea or vomiting.    Dispense:  15 tablet    Refill:  0   Declines any laboratory investigations this afternoon.  Reviewed expectations re: course of current medical issues. Questions answered. Outlined signs and symptoms indicating need for more acute intervention. Understanding verbalized. After Visit Summary given.   SUBJECTIVE: History from: Patient. Jeff Stewart is a 68 y.o. male. Pt c/o fatigue x 2-3 weeks. Has been worse over last 3 days. Trouble sleeping at night, legs jerking. Some nausea and not eating due to that.  Normal urination. Would like Rx Zofran  sent in.  OBJECTIVE:  Vitals:   03/02/24 1357 03/02/24 1402  BP:  (!) 150/90  Pulse: 90   Resp: 17   Temp: 98.1 F (36.7 C)   TempSrc: Oral   SpO2: 97%     General appearance: alert; no distress but appears fatigued CV: regular Lungs:  speaks full sentences without difficulty; unlabored; clear Extremities: no edema Skin: warm and dry Neurologic: normal gait Psychological: alert and cooperative; normal mood and affect  Labs:  Labs Reviewed - No data to display  Imaging: No results found.  Allergies[1]  Past Medical History:  Diagnosis Date   Anxiety    Carpal tunnel syndrome, bilateral    Cataract    removed years ago   Chronic back pain    internal morphine pump   DDD (degenerative disc disease)    neck, lumbar   Depression    Dyslipidemia    diet controlled   GERD (gastroesophageal reflux disease)    past hx- had nissen fundiplication    Hypertension    borderline   Sleep apnea    wears C-PAP   Status post insertion of spinal cord stimulator    Social History   Socioeconomic History   Marital status: Married    Spouse name: Not on file   Number of children: 3   Years of education: Not on file   Highest education level: GED or equivalent  Occupational History   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  Tobacco Use   Smoking status: Never   Smokeless tobacco: Never  Vaping Use   Vaping status: Never Used  Substance and Sexual Activity   Alcohol  use: No   Drug use: No   Sexual activity: Not Currently    Birth control/protection: Post-menopausal    Comment: married for 1972  Other Topics Concern   Not on file  Social History Narrative   Lives with wife    Children live nearby   Retired    1-2 cup of coffee    Social Drivers of Health   Tobacco Use: Low Risk (03/02/2024)   Patient History    Smoking Tobacco Use: Never    Smokeless Tobacco Use: Never    Passive Exposure: Not on file  Financial Resource Strain: Low Risk (10/14/2023)   Overall Financial Resource Strain (CARDIA)    Difficulty of Paying Living Expenses: Not hard at all  Food Insecurity: No Food Insecurity (10/14/2023)   Epic    Worried About Radiation Protection Practitioner of Food in the Last Year: Never true    Ran Out of  Food in the Last Year: Never true  Transportation Needs: No Transportation Needs (10/14/2023)   Epic    Lack of Transportation (Medical): No    Lack of Transportation (Non-Medical): No  Physical Activity: Inactive (10/14/2023)   Exercise Vital Sign    Days of Exercise per Week: 0 days    Minutes of Exercise per Session: Not on file  Stress: No Stress Concern Present (10/14/2023)   [employer] of Occupational Health - Occupational Stress Questionnaire    Feeling of Stress: Not at all  Social Connections: Moderately Integrated (10/14/2023)   Social Connection and Isolation Panel    Frequency of Communication with Friends and Family: More than three times a week    Frequency of Social Gatherings with Friends and Family: More than three times a week    Attends Religious Services: More than 4 times per year    Active Member of [employer] or Organizations: No    Attends Engineer, Structural: Not on file    Marital Status: Married  Recent Concern: Social Connections - Moderately Isolated (08/07/2023)   Social Connection and Isolation Panel    Frequency of Communication with Friends and Family: Once a week    Frequency of Social Gatherings with Friends and Family: Once a week    Attends Religious Services: More than 4 times per year    Active Member of [employer] or Organizations: No    Attends Banker Meetings: Never    Marital Status: Married  Catering Manager Violence: Not At Risk (08/07/2023)   Humiliation, Afraid, Rape, and Kick questionnaire    Fear of Current or Ex-Partner: No    Emotionally Abused: No    Physically Abused: No    Sexually Abused: No  Depression (PHQ2-9): High Risk (11/08/2023)   Depression (PHQ2-9)    PHQ-2 Score: 16  Alcohol Screen: Low Risk (08/07/2023)   Alcohol Screen    Last Alcohol Screening Score (AUDIT): 0  Housing: Unknown (10/14/2023)   Epic    Unable to Pay for Housing in the Last Year: Patient declined    Number of Times Moved in the  Last Year: Not on file    Homeless in the Last Year: No  Utilities: Not At Risk (08/17/2023)   Received from [facility]    Threatened with loss of utilities: No  Health Literacy: Adequate Health Literacy (08/07/2023)   B1300 Health Literacy    Frequency of need for help with medical instructions: Never   Family History  Problem Relation Age of Onset   Stroke Mother  62   CAD Father 31   Emphysema Father    CAD Sister    Hypertension Sister    Hyperlipidemia Sister    Cancer Sister        melanoma   Cancer Sister    Lung cancer Sister    CAD Brother 72       CABG   Alcohol abuse Brother    Hypertension Brother    Stroke Brother    CAD Brother    Cirrhosis Brother    Alcohol abuse Brother    Hypertension Brother    Cancer Brother    Lung cancer Brother    Cancer Brother    Lung cancer Brother    Migraines Other    Colon cancer Neg Hx    Colon polyps Neg Hx    Esophageal cancer Neg Hx    Rectal cancer Neg Hx    Stomach cancer Neg Hx    Seizures Neg Hx    Past Surgical History:  Procedure Laterality Date   BACK SURGERY     x 6   CARPAL TUNNEL RELEASE     bilateral   COLONOSCOPY     ELBOW SURGERY     INGUINAL HERNIA REPAIR Right    INTERNAL MORPHINE PUMP      KNEE ARTHROSCOPY     NASAL SEPTUM SURGERY     x 6   neck fusion   07/18/2023   neck surgery pe rpt   NISSEN FUNDOPLICATION     SKIN CANCER EXCISION     SPINAL CORD STIMULATOR INSERTION     SPINAL CORD STIMULATOR REMOVAL  02/2023   STOMACH SURGERY     Nissen Fundiplication   thumb surgery     TOTAL KNEE ARTHROPLASTY Left 04/24/2018   Procedure: TOTAL KNEE ARTHROPLASTY;  Surgeon: Beverley Evalene BIRCH, MD;  Location: [facility];  Service: Orthopedics;  Laterality: Left;   UPPER GASTROINTESTINAL ENDOSCOPY        [1]  Allergies Allergen Reactions   Nalbuphine Nausea And Vomiting, Rash and Shortness Of Breath   Nubain [Nalbuphine Hcl] Rash     Rolinda Rogue, MD 03/02/24 1459

## 2024-03-02 NOTE — ED Triage Notes (Signed)
 Pt c/o fatigue x 2-3 weeks. Gotten worse in last 3 days. Trouble sleeping at night, legs jerking. Some nausea and not eating due to that.

## 2024-03-04 ENCOUNTER — Ambulatory Visit: Payer: Self-pay

## 2024-03-04 ENCOUNTER — Telehealth: Payer: Self-pay | Admitting: Family Medicine

## 2024-03-04 NOTE — Telephone Encounter (Signed)
 Fatigue, not eating, really bad nose bleeds, cannot sleep, headaches  Sleep apnea under control. Biopap- doing well with no episodes.  Does good for a few weeks and then sx's come back.  Pt states that he has an appt for this on 1/8. Scheduled for 1/2 at 8:35. Pt wanted to see just Dettinger. Advised pt that if he becomes worse that he should see who ever is on call. Pt agreed. He does not want to go to ER/ urgent care.

## 2024-03-04 NOTE — Telephone Encounter (Signed)
 FYI Only or Action Required?: Action required by provider: request for appointment.  Patient was last seen in primary care on 11/08/2023 by Dettinger, Fonda LABOR, MD.  Called Nurse Triage reporting Fatigue.  Symptoms began several months ago.  Interventions attempted: Nothing.  Symptoms are: gradually worsening.  Triage Disposition: See PCP When Office is Open (Within 3 Days)  Patient/caregiver understands and will follow disposition?: Yes, will follow disposition  Reason for Disposition  [1] Fatigue (i.e., tires easily, decreased energy) AND [2] persists > 1 week  Answer Assessment - Initial Assessment Questions 1. DESCRIPTION: Describe how you are feeling.     Currently states that he feels better today than most of last week 2. SEVERITY: How bad is it?  Can you stand and walk?     Can stand and walk, states when it occurs he cannot sleep 3. ONSET: When did these symptoms begin? (e.g., hours, days, weeks, months)     Ongoing for months, intermittent 4. CAUSE: What do you think is causing the weakness or fatigue? (e.g., not drinking enough fluids, medical problem, trouble sleeping)     States he does have trouble sleeping because of the fatigue 5. NEW MEDICINES:  Have you started on any new medicines recently? (e.g., opioid pain medicines, benzodiazepines, muscle relaxants, antidepressants, antihistamines, neuroleptics, beta blockers)     Pt states he did have a spinal cord stimulator removed from his back and ever since has had problems with the fatigue 6. OTHER SYMPTOMS: Do you have any other symptoms? (e.g., chest pain, fever, cough, SOB, vomiting, diarrhea, bleeding, other areas of pain)     Denies states that his BP can be inconsistent.  Routing to clinic for scheduling, no appt available with PCP.  Protocols used: Weakness (Generalized) and Fatigue-A-AH

## 2024-03-04 NOTE — Telephone Encounter (Signed)
 Copied from CRM #[id number]. Topic: Clinical - Red Word Triage >> Mar 04, 2024  2:07 PM Delon HERO wrote: Red Word that prompted transfer to Nurse Triage: Patient returned RED WORD NT Call d/c call after holding 15 mins.

## 2024-03-04 NOTE — Telephone Encounter (Signed)
 Called pt back - left message on machine requesting a return call.      Summary: Mobile [phone]  Jeff Stewart: Dizzness, fatigue, lethargy, no appetite   Reason for Triage: Pt was seen at urgent care on Saturday with fatigue, lethargy, dizziness, loss of appetite and not sleeping, reoccurring pt's spouse attempted to make and appt but DT would not allow me to do so Please call pt to advise

## 2024-03-04 NOTE — Telephone Encounter (Signed)
 Appt has been scheduled 1/2. Pt only wants to see Dettinger. Advised that pt needs to make a sooner appt if symptoms do not improve or become worse.

## 2024-03-05 NOTE — Telephone Encounter (Signed)
 Jeff Stewart

## 2024-03-05 NOTE — Telephone Encounter (Signed)
 Duplicate

## 2024-03-06 NOTE — Telephone Encounter (Signed)
 Patient had 6 back surgeries and is in chronic pain, he can't sleep because his legs jerk and his back hurts. Not a question of BiPAP function or apnea control.   He feels the leg jerking started after his spinal cord stimulator was removed, he reports.  .The stimulator was working only 'sporadically and not enough for him to increase quality of life.   The patient is followed by [facility] on [address] in [place], [place].  [phone]. He has been on a morphine pain pump,managed by the pain institute,  the narcotics have contributed to his need for  BiPAP therapy, creating a complex apnea.   BiPAP therapy is successful as it can be,  the insomnia is not an apnea effect and BiPAP works well from the pt's point of view ,too.   His sleep impairment is chronic pain and cannot  be helped by us .    The patient reports he has been offered nerve blocks or nerve ablation to help control his leg pains, but is reluctant to undergo the procedure , feels he has not yet enough information.  He is certainly wary of more surgery.

## 2024-03-07 ENCOUNTER — Ambulatory Visit: Admitting: Gastroenterology

## 2024-03-19 ENCOUNTER — Ambulatory Visit (INDEPENDENT_AMBULATORY_CARE_PROVIDER_SITE_OTHER): Admitting: Family Medicine

## 2024-03-19 VITALS — BP 146/90 | HR 88 | Temp 97.9°F | Ht 73.0 in | Wt 241.6 lb

## 2024-03-19 DIAGNOSIS — F5103 Paradoxical insomnia: Secondary | ICD-10-CM

## 2024-03-19 DIAGNOSIS — G4739 Other sleep apnea: Secondary | ICD-10-CM | POA: Diagnosis not present

## 2024-03-19 DIAGNOSIS — Z23 Encounter for immunization: Secondary | ICD-10-CM | POA: Diagnosis not present

## 2024-03-19 DIAGNOSIS — F419 Anxiety disorder, unspecified: Secondary | ICD-10-CM

## 2024-03-19 DIAGNOSIS — F32A Depression, unspecified: Secondary | ICD-10-CM

## 2024-03-19 MED ORDER — HYDROXYZINE PAMOATE 25 MG PO CAPS: 25.0000 mg | ORAL_CAPSULE | Freq: Every evening | ORAL | 0 refills | Status: DC | PRN

## 2024-03-19 NOTE — Patient Instructions (Addendum)
 Nervivie and magnesium  complex

## 2024-03-19 NOTE — Progress Notes (Signed)
 "    Subjective:  Patient ID: Jeff Stewart, male    DOB: 1956/01/18, 68 y.o.   MRN: [id number]  Patient Care Team: Dettinger, Fonda LABOR, MD as PCP - General (Family Medicine) Nancey Bruckner, MD as Referring Physician (Pain Medicine) Gladis Gearing, OD (Optometry) Shari Easter, MD as Consulting Physician (Orthopedic Surgery) Specialists, [facility] (Orthopedic Surgery) Aneita Gwendlyn DASEN, MD (Inactive) as Consulting Physician (Gastroenterology) Lenis Ethelle ORN, MD as Consulting Physician (Endocrinology)   Chief Complaint:  not sleeping  (X 1 year- states he will sleep all day and then can not sleep for a few days )   HPI: Jeff Stewart is a 68 y.o. male presenting on 03/19/2024 for not sleeping  (X 1 year- states he will sleep all day and then can not sleep for a few days )   Jeff Stewart is a 68 year old male with sleep apnea who presents with insomnia and sleep disturbances.  He has been experiencing significant sleep disturbances for over a year, characterized by alternating periods of excessive sleepiness and insomnia. These episodes can last two to three days and have been occurring more frequently in recent weeks. During these episodes, he cannot be awakened during the day, followed by nights of sleeplessness, walking the floors, and experiencing pain.  He has a history of sleep apnea, managed with a BiPAP machine, and reports that it is under control. Despite this, his sleep disturbances persist. Previous treatments, including Lunesta , have been ineffective. He has also tried over-the-counter melatonin, taking three 12 mg gummies nightly, without significant improvement.  He has been diagnosed with restless leg syndrome in the past and has tried various medications. However, his leg symptoms only occur during these episodes of sleep disturbance. A nerve conduction study was performed as part of his evaluation for restless leg syndrome.  He is  currently on a morphine pump for pain management, which is set at a fixed rate and cannot be adjusted by him. He also takes Zoloft  150 mg daily for anxiety and depression, which he has been on for years without a recent dose adjustment.  No recent changes in diet, caffeine intake, or other lifestyle factors that could be contributing to his symptoms. His thyroid  and testosterone  levels have been checked multiple times and are normal.          Relevant past medical, surgical, family, and social history reviewed and updated as indicated.  Allergies and medications reviewed and updated. Data reviewed: Chart in Epic.   Past Medical History:  Diagnosis Date   Anxiety    Carpal tunnel syndrome, bilateral    Cataract    removed years ago   Chronic back pain    internal morphine pump   DDD (degenerative disc disease)    neck, lumbar   Depression    Dyslipidemia    diet controlled   GERD (gastroesophageal reflux disease)    past hx- had nissen fundiplication    Hypertension    borderline   Sleep apnea    wears C-PAP   Status post insertion of spinal cord stimulator     Past Surgical History:  Procedure Laterality Date   BACK SURGERY     x 6   CARPAL TUNNEL RELEASE     bilateral   COLONOSCOPY     ELBOW SURGERY     INGUINAL HERNIA REPAIR Right    INTERNAL MORPHINE PUMP      KNEE ARTHROSCOPY     NASAL SEPTUM SURGERY  x 6   neck fusion   07/18/2023   neck surgery pe rpt   NISSEN FUNDOPLICATION     SKIN CANCER EXCISION     SPINAL CORD STIMULATOR INSERTION     SPINAL CORD STIMULATOR REMOVAL  02/2023   STOMACH SURGERY     Nissen Fundiplication   thumb surgery     TOTAL KNEE ARTHROPLASTY Left 04/24/2018   Procedure: TOTAL KNEE ARTHROPLASTY;  Surgeon: Beverley Evalene BIRCH, MD;  Location: [facility];  Service: Orthopedics;  Laterality: Left;   UPPER GASTROINTESTINAL ENDOSCOPY      Social History   Socioeconomic History   Marital status: Married    Spouse name: Not on file    Number of children: 3   Years of education: Not on file   Highest education level: GED or equivalent  Occupational History   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  Tobacco Use   Smoking status: Never   Smokeless tobacco: Never  Vaping Use   Vaping status: Never Used  Substance and Sexual Activity   Alcohol use: No   Drug use: No   Sexual activity: Not Currently    Birth control/protection: Post-menopausal    Comment: married for 1972  Other Topics Concern   Not on file  Social History Narrative   Lives with wife    Children live nearby   Retired    1-2 cup of coffee    Social Drivers of Health   Tobacco Use: Low Risk (03/19/2024)   Patient History    Smoking Tobacco Use: Never    Smokeless Tobacco Use: Never    Passive Exposure: Not on file  Financial Resource Strain: Low Risk (03/19/2024)   Overall Financial Resource Strain (CARDIA)    Difficulty of Paying Living Expenses: Not hard at all  Food Insecurity: No Food Insecurity (03/19/2024)   Epic    Worried About Programme Researcher, Broadcasting/film/video in the Last Year: Never true    Ran Out of Food in the Last Year: Never true  Transportation Needs: No Transportation Needs (03/19/2024)   Epic    Lack of Transportation (Medical): No    Lack of Transportation (Non-Medical): No  Physical Activity: Insufficiently Active (03/19/2024)   Exercise Vital Sign    Days of Exercise per Week: 1 day    Minutes of Exercise per Session: 20 min  Stress: No Stress Concern Present (03/19/2024)   [employer] of Occupational Health - Occupational Stress Questionnaire    Feeling of Stress: Only a little  Social Connections: Socially Integrated (03/19/2024)   Social Connection and Isolation Panel    Frequency of Communication with Friends and Family: More than three times a week    Frequency of Social Gatherings with Friends and Family: Once a week    Attends Religious Services: More than 4 times per year    Active Member of [employer] or  Organizations: Yes    Attends Engineer, Structural: More than 4 times per year    Marital Status: Married  Catering Manager Violence: Not At Risk (03/03/2024)   Received from [facility]   HITS    Over the last 12 months how often did your partner physically hurt you?: Never    Over the last 12 months how often did your partner insult you or talk down to you?: Never    Over the last 12 months how often did your partner threaten you with physical harm?: Never    Over the last 12  months how often did your partner scream or curse at you?: Never  Depression (PHQ2-9): Low Risk (03/19/2024)   Depression (PHQ2-9)    PHQ-2 Score: 4  Alcohol Screen: Low Risk (08/07/2023)   Alcohol Screen    Last Alcohol Screening Score (AUDIT): 0  Housing: Unknown (03/19/2024)   Epic    Unable to Pay for Housing in the Last Year: No    Number of Times Moved in the Last Year: Not on file    Homeless in the Last Year: No  Utilities: Not At Risk (08/17/2023)   Received from [facility] Utilities    Threatened with loss of utilities: No  Health Literacy: Adequate Health Literacy (08/07/2023)   B1300 Health Literacy    Frequency of need for help with medical instructions: Never    Outpatient Encounter Medications as of 03/19/2024  Medication Sig   acetaminophen  (TYLENOL ) 500 MG tablet Take 500 mg by mouth every 6 (six) hours as needed.   Cholecalciferol (VITAMIN D ) 50 MCG (2000 UT) CAPS Take 2,000 Units by mouth daily with lunch.   fluticasone  (FLONASE ) 50 MCG/ACT nasal spray Place 2 sprays into both nostrils daily.   hydrochlorothiazide  (HYDRODIURIL ) 25 MG tablet Take 1 tablet (25 mg total) by mouth daily. TAKE 1 TABLET DAILY.   hydrOXYzine (VISTARIL) 25 MG capsule Take 1 capsule (25 mg total) by mouth at bedtime as needed (sleep, may repeat in one hour).   losartan  (COZAAR ) 50 MG tablet Take 1 tablet (50 mg total) by mouth 2 (two) times daily.   NON FORMULARY Morphine pain pump    ondansetron  (ZOFRAN -ODT) 4 MG disintegrating tablet Take 1 tablet (4 mg total) by mouth every 8 (eight) hours as needed for nausea or vomiting.   pantoprazole  (PROTONIX ) 40 MG tablet Take 1 tablet (40 mg total) by mouth daily.   sertraline  (ZOLOFT ) 100 MG tablet Take 1.5 tablets (150 mg total) by mouth daily.   Syringe/Needle, Disp, (SYRINGE 3CC/21GX1-1/4) 21G X 1-1/4 3 ML MISC 1 each by Does not apply route once a week.   testosterone  cypionate (DEPOTESTOSTERONE CYPIONATE) 200 MG/ML injection INJECT 0.5 MLS EVERY 10 DAYS   celecoxib  (CELEBREX ) 200 MG capsule Take 1 capsule (200 mg total) by mouth daily. (Patient not taking: Reported on 03/19/2024)   eszopiclone  (LUNESTA ) 2 MG TABS tablet Take 1 tablet (2 mg total) by mouth at bedtime as needed for sleep. Take immediately before bedtime and do not use more than two nights per week (Patient not taking: Reported on 03/19/2024)   furosemide  (LASIX ) 40 MG tablet Take 1 tablet (40 mg total) by mouth every morning. (Patient not taking: Reported on 03/19/2024)   No facility-administered encounter medications on file as of 03/19/2024.    Allergies[1]  Pertinent ROS per HPI, otherwise unremarkable      Objective:  BP (!) 146/90   Pulse 88   Temp 97.9 F (36.6 C)   Ht 6' 1 (1.854 m)   Wt 241 lb 9.6 oz (109.6 kg)   SpO2 98%   BMI 31.88 kg/m    Wt Readings from Last 3 Encounters:  03/19/24 241 lb 9.6 oz (109.6 kg)  02/22/24 255 lb (115.7 kg)  01/30/24 254 lb 12.8 oz (115.6 kg)    Physical Exam Vitals and nursing note reviewed.  Constitutional:      General: He is not in acute distress.    Appearance: He is not ill-appearing, toxic-appearing or diaphoretic.  HENT:     Head: Normocephalic and atraumatic.  Mouth/Throat:     Mouth: Mucous membranes are moist.  Eyes:     Pupils: Pupils are equal, round, and reactive to light.  Cardiovascular:     Rate and Rhythm: Normal rate.  Pulmonary:     Effort: Pulmonary effort is normal.   Musculoskeletal:     Cervical back: Neck supple.     Right lower leg: No edema.     Left lower leg: No edema.  Skin:    General: Skin is warm and dry.     Capillary Refill: Capillary refill takes less than 2 seconds.  Neurological:     General: No focal deficit present.     Mental Status: He is alert and oriented to person, place, and time.  Psychiatric:        Attention and Perception: Attention and perception normal.        Mood and Affect: Mood is anxious.        Behavior: Behavior normal.        Thought Content: Thought content normal.        Cognition and Memory: Cognition and memory normal.        Judgment: Judgment normal.     Comments: Unable to sit still     Results for orders placed or performed during the hospital encounter of 12/03/23  POCT Finger stick Glucose   Collection Time: 12/03/23  9:30 AM  Result Value Ref Range   POCT Glucose (K[facility]) 101 (A) 70 - 99 mg/dL  Sedimentation rate   Collection Time: 12/03/23  9:50 AM  Result Value Ref Range   Sed Rate 22 0 - 30 mm/hr       Pertinent labs & imaging results that were available during my care of the patient were reviewed by me and considered in my medical decision making.  Assessment & Plan:  Jeff Stewart was seen today for not sleeping .  Diagnoses and all orders for this visit:  Paradoxical insomnia -     Ambulatory referral to Sleep Studies -     hydrOXYzine (VISTARIL) 25 MG capsule; Take 1 capsule (25 mg total) by mouth at bedtime as needed (sleep, may repeat in one hour).  Complex sleep apnea syndrome -     Ambulatory referral to Sleep Studies  Anxiety and depression -     hydrOXYzine (VISTARIL) 25 MG capsule; Take 1 capsule (25 mg total) by mouth at bedtime as needed (sleep, may repeat in one hour).  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 Insomnia episodes last 3-4 days, with increased frequency recently. Previous trials of Lunesta  were ineffective. Current management includes melatonin. Differential includes nerve-related pain and anxiety contributing to sleep disturbances. - Referred to Dr. Oma at [facility] in [place] for further evaluation and management. - Recommended trial of Nervive and magnesium  complex supplements for nerve pain and sleep aid. - Prescribed hydroxyzine 25 mg, with instructions to take one tablet at bedtime and a second tablet if not asleep within one hour, not to exceed two tablets per night.  Complex sleep apnea syndrome Managed with Bipap. No recent adjustments to Bipap settings. Sleep apnea is well-controlled, but insomnia persists independently of sleep apnea management.  Anxiety and depression Chronic anxiety and depression, currently managed with Zoloft  150 mg daily. Anxiety appears uncontrolled, contributing to insomnia. No recent dose  adjustments. - Increase Zoloft  to 200 mg daily until follow-up with Dr. Maryanne. - Discuss potential dose adjustment with Dr. Maryanne during next visit.          Continue all other maintenance medications.  Follow up plan: Follow up with PCP as scheduled   Continue healthy lifestyle choices, including diet (rich in fruits, vegetables, and lean proteins, and low in salt and simple carbohydrates) and exercise (at least 30 minutes of moderate physical activity daily).   The above assessment and management plan was discussed with the patient. The patient verbalized understanding of and has agreed to the management plan. Patient is aware to call the clinic if they develop any new symptoms or if symptoms persist or worsen. Patient is aware when to return to the clinic for a follow-up visit. Patient educated on when it is appropriate to go to the emergency department.   Rosaline Bruns, FNP-C [facility] [phone]     [1]  Allergies Allergen Reactions   Nalbuphine Nausea And Vomiting, Rash and Shortness Of Breath    nalbuphine   Nubain [Nalbuphine Hcl] Rash   "

## 2024-03-21 ENCOUNTER — Other Ambulatory Visit: Payer: Self-pay

## 2024-03-21 ENCOUNTER — Encounter (HOSPITAL_BASED_OUTPATIENT_CLINIC_OR_DEPARTMENT_OTHER): Payer: Self-pay

## 2024-03-21 ENCOUNTER — Telehealth: Payer: Self-pay | Admitting: *Deleted

## 2024-03-21 ENCOUNTER — Emergency Department (HOSPITAL_BASED_OUTPATIENT_CLINIC_OR_DEPARTMENT_OTHER)
Admission: EM | Admit: 2024-03-21 | Discharge: 2024-03-22 | Disposition: A | Discharge status: Other Acute Inpatient Hospital | Source: Home / Self Care

## 2024-03-21 ENCOUNTER — Ambulatory Visit: Payer: Self-pay

## 2024-03-21 DIAGNOSIS — Z79899 Other long term (current) drug therapy: Secondary | ICD-10-CM | POA: Insufficient documentation

## 2024-03-21 DIAGNOSIS — G894 Chronic pain syndrome: Secondary | ICD-10-CM | POA: Insufficient documentation

## 2024-03-21 DIAGNOSIS — G4700 Insomnia, unspecified: Secondary | ICD-10-CM | POA: Insufficient documentation

## 2024-03-21 DIAGNOSIS — G478 Other sleep disorders: Secondary | ICD-10-CM | POA: Diagnosis not present

## 2024-03-21 DIAGNOSIS — F332 Major depressive disorder, recurrent severe without psychotic features: Secondary | ICD-10-CM | POA: Insufficient documentation

## 2024-03-21 DIAGNOSIS — I10 Essential (primary) hypertension: Secondary | ICD-10-CM | POA: Diagnosis not present

## 2024-03-21 DIAGNOSIS — F32A Depression, unspecified: Secondary | ICD-10-CM

## 2024-03-21 DIAGNOSIS — R45851 Suicidal ideations: Secondary | ICD-10-CM | POA: Insufficient documentation

## 2024-03-21 LAB — COMPREHENSIVE METABOLIC PANEL WITH GFR
ALT: 18 U/L (ref 0–44)
AST: 16 U/L (ref 15–41)
Albumin: 4.4 g/dL (ref 3.5–5.0)
Alkaline Phosphatase: 86 U/L (ref 38–126)
Anion gap: 15 (ref 5–15)
BUN: 22 mg/dL (ref 8–23)
CO2: 22 mmol/L (ref 22–32)
Calcium: 9.7 mg/dL (ref 8.9–10.3)
Chloride: 100 mmol/L (ref 98–111)
Creatinine, Ser: 0.87 mg/dL (ref 0.61–1.24)
GFR, Estimated: >60 mL/min
Glucose, Bld: 100 mg/dL — ABNORMAL HIGH (ref 70–99)
Potassium: 3.4 mmol/L — ABNORMAL LOW (ref 3.5–5.1)
Sodium: 137 mmol/L (ref 135–145)
Total Bilirubin: 0.8 mg/dL (ref 0.0–1.2)
Total Protein: 7.1 g/dL (ref 6.5–8.1)

## 2024-03-21 LAB — URINE DRUG SCREEN
Amphetamines: NEGATIVE
Barbiturates: NEGATIVE
Benzodiazepines: NEGATIVE
Cocaine: NEGATIVE
Fentanyl: NEGATIVE
Methadone Scn, Ur: NEGATIVE
Opiates: POSITIVE — AB
Tetrahydrocannabinol: NEGATIVE

## 2024-03-21 LAB — URINALYSIS, ROUTINE W REFLEX MICROSCOPIC
Bilirubin Urine: NEGATIVE
Glucose, UA: NEGATIVE mg/dL
Hgb urine dipstick: NEGATIVE
Leukocytes,Ua: NEGATIVE
Nitrite: NEGATIVE
Protein, ur: NEGATIVE mg/dL
Specific Gravity, Urine: 1.02 (ref 1.005–1.030)
pH: 6 (ref 5.0–8.0)

## 2024-03-21 LAB — CBC
HCT: 44.2 % (ref 39.0–52.0)
Hemoglobin: 15.4 g/dL (ref 13.0–17.0)
MCH: 29.4 pg (ref 26.0–34.0)
MCHC: 34.8 g/dL (ref 30.0–36.0)
MCV: 84.4 fL (ref 80.0–100.0)
Platelets: 237 K/uL (ref 150–400)
RBC: 5.24 MIL/uL (ref 4.22–5.81)
RDW: 12.9 % (ref 11.5–15.5)
WBC: 9.4 K/uL (ref 4.0–10.5)
nRBC: 0 % (ref 0.0–0.2)

## 2024-03-21 LAB — ETHANOL: Alcohol, Ethyl (B): <15 mg/dL

## 2024-03-21 MED ORDER — LOSARTAN POTASSIUM 25 MG PO TABS
50.0000 mg | ORAL_TABLET | Freq: Two times a day (BID) | ORAL | Status: DC
  Administered 2024-03-21 – 2024-03-22 (×2): 50 mg via ORAL
  Filled 2024-03-21 (×2): qty 2

## 2024-03-21 MED ORDER — PANTOPRAZOLE SODIUM 40 MG PO TBEC
40.0000 mg | DELAYED_RELEASE_TABLET | Freq: Every day | ORAL | Status: DC
  Administered 2024-03-22: 40 mg via ORAL
  Filled 2024-03-21: qty 1

## 2024-03-21 MED ORDER — SERTRALINE HCL 25 MG PO TABS: 150.0000 mg | ORAL_TABLET | Freq: Every day | ORAL | Status: DC

## 2024-03-21 MED ORDER — HYDROCHLOROTHIAZIDE 25 MG PO TABS
25.0000 mg | ORAL_TABLET | Freq: Every day | ORAL | Status: DC
  Administered 2024-03-22: 25 mg via ORAL
  Filled 2024-03-21: qty 1

## 2024-03-21 NOTE — BH Assessment (Signed)
 TTS Consult will be completed by IRIS. IRIS Coordinator will communicate in chat assessment time and provider name. Thanks

## 2024-03-21 NOTE — Telephone Encounter (Signed)
"   RN called patient per MD request.  She has reviewed his medical record.  He has scheduled an appointment for insomnia.  He just saw his doctor for insomnia on 03/19/2024 and MD will not be changing the management outlined by his primary care doctor.  He should call her tomorrow.    Patient states he has been dealing with sleepless nights for the past year, he uses a CPAP and his settings are stable, states med PCP prescribed not helping.  Advised patient per note above from MD.  Advised that he go to ED for any significantly new or worsening symptoms prior to office opening.  Patient states understanding.   "

## 2024-03-21 NOTE — ED Provider Notes (Signed)
 " [place] EMERGENCY DEPARTMENT AT [facility] Provider Note   CSN: [id number] Arrival date & time: 03/21/24  2007     Patient presents with: Suicidal, Fatigue, and Insomnia   Jeff Stewart is a 69 y.o. male.   Patient to ED with family at bedside reporting suicidal ideations. He has a history of significant insomnia stating he will go for days without sleep, then sleep for a solid day, then stay up for days more. He has seen several doctors and tried multiple medications, including Lunesta  and Vistaril , that have not offered any relief. Currently he has been awake for 3 days. No AVH, no history of hallucinations. His wife states he becomes depressed and had talked about suicide in the past but has not attempted. The patient, his wife and son feel his symptoms are more severe than in the past and family is concerned he will act on his SI.   The history is provided by the patient, the spouse and a relative. No language interpreter was used.  Insomnia       Prior to Admission medications  Medication Sig Start Date End Date Taking? Authorizing Provider  acetaminophen  (TYLENOL ) 500 MG tablet Take 500 mg by mouth every 6 (six) hours as needed.    [provider]  Cholecalciferol (VITAMIN D ) 50 MCG (2000 UT) CAPS Take 2,000 Units by mouth daily with lunch.    [provider]  fluticasone  (FLONASE ) 50 MCG/ACT nasal spray Place 2 sprays into both nostrils daily. 04/06/20   Dettinger, Fonda LABOR, MD  hydrochlorothiazide  (HYDRODIURIL ) 25 MG tablet Take 1 tablet (25 mg total) by mouth daily. TAKE 1 TABLET DAILY. 10/18/23   Dettinger, Fonda LABOR, MD  LORazepam  (ATIVAN ) 0.5 MG tablet Take 1 tablet (0.5 mg total) by mouth 3 (three) times daily. 03/25/24   Jadapalle, Sree, MD  losartan  (COZAAR ) 50 MG tablet Take 1 tablet (50 mg total) by mouth 2 (two) times daily. 10/18/23   Dettinger, Fonda LABOR, MD  ondansetron  (ZOFRAN -ODT) 4 MG disintegrating tablet Take 1 tablet (4 mg total) by  mouth every 8 (eight) hours as needed for nausea or vomiting. 03/02/24   Rolinda Rogue, MD  pantoprazole  (PROTONIX ) 40 MG tablet Take 1 tablet (40 mg total) by mouth daily. 10/18/23   Dettinger, Fonda LABOR, MD  QUEtiapine  (SEROQUEL ) 50 MG tablet Take 1 tablet (50 mg total) by mouth at bedtime and may repeat dose one time if needed. 03/25/24   Jadapalle, Sree, MD  sertraline  (ZOLOFT ) 50 MG tablet Take 3 tablets (150 mg total) by mouth at bedtime. 03/25/24   Donnelly Mellow, MD  Syringe/Needle, Disp, (SYRINGE 3CC/21GX1-1/4) 21G X 1-1/4 3 ML MISC 1 each by Does not apply route once a week. 07/10/20   Nida, Gebreselassie W, MD  testosterone  cypionate (DEPOTESTOSTERONE CYPIONATE) 200 MG/ML injection INJECT 0.5 MLS EVERY 10 DAYS 01/30/24   Nida, Gebreselassie W, MD    Allergies: Nalbuphine and Nubain [nalbuphine hcl]    Review of Systems  Psychiatric/Behavioral:  The patient has insomnia.     Updated Vital Signs BP 139/81   Pulse 68   Temp 98 F (36.7 C) (Oral)   Resp 17   Ht 6' 1 (1.854 m)   Wt 109.3 kg   SpO2 99%   BMI 31.80 kg/m   Physical Exam Vitals and nursing note reviewed.  Constitutional:      Appearance: He is well-developed.  HENT:     Head: Normocephalic.  Cardiovascular:     Rate and  Rhythm: Normal rate and regular rhythm.     Heart sounds: No murmur heard. Pulmonary:     Effort: Pulmonary effort is normal.     Breath sounds: Normal breath sounds. No wheezing, rhonchi or rales.  Abdominal:     General: Bowel sounds are normal.     Palpations: Abdomen is soft.     Tenderness: There is no abdominal tenderness. There is no guarding or rebound.  Musculoskeletal:        General: Normal range of motion.     Cervical back: Normal range of motion and neck supple.  Skin:    General: Skin is warm and dry.  Neurological:     General: No focal deficit present.     Mental Status: He is alert and oriented to person, place, and time.  Psychiatric:        Attention and  Perception: He does not perceive auditory or visual hallucinations.        Mood and Affect: Mood is depressed.        Speech: Speech normal.        Behavior: Behavior is slowed. Behavior is cooperative.        Thought Content: Thought content includes suicidal ideation. Thought content does not include suicidal plan.     (all labs ordered are listed, but only abnormal results are displayed) Labs Reviewed  COMPREHENSIVE METABOLIC PANEL WITH GFR - Abnormal; Notable for the following components:      Result Value   Potassium 3.4 (*)    Glucose, Bld 100 (*)    All other components within normal limits  URINALYSIS, ROUTINE W REFLEX MICROSCOPIC - Abnormal; Notable for the following components:   Color, Urine STRAW (*)    Ketones, ur TRACE (*)    All other components within normal limits  URINE DRUG SCREEN - Abnormal; Notable for the following components:   Opiates POSITIVE (*)    All other components within normal limits  RESP PANEL BY RT-PCR (RSV, FLU A&B, COVID)  RVPGX2  CBC  ETHANOL  CBG MONITORING, ED    EKG: EKG Interpretation Date/Time:  Thursday March 21 2024 20:18:08 EST Ventricular Rate:  81 PR Interval:  160 QRS Duration:  92 QT Interval:  370 QTC Calculation: 429 R Axis:   -43  Text Interpretation: Normal sinus rhythm Left axis deviation Minimal voltage criteria for LVH, may be normal variant ( R in aVL ) Age indeterminate anterior infarct Compared with prior EKG from 03/20/2023 Confirmed by Gennaro Bouchard ([id number]) on 03/21/2024 8:34:11 PM  Radiology: No results found.   Procedures   Medications Ordered in the ED  QUEtiapine  (SEROQUEL ) tablet 50 mg (50 mg Oral Given 03/22/24 0253)    Clinical Course as of 03/26/24 0702  Thu Mar 21, 2024  2112 Patient to ED with SI, here with family. No history of attempts, but family reporting symptoms are progressively worsening and he has become despondent.  [SU]  2113 Will get medical clearance and request TTS consultation.   [SU]  2200 Patient has been recommended for inpatient psychiatric treatment. Geripsych bed being sought. Recommendations have been made by psychiatrist who did the evaluation - very helpful and appreciate his input.  [SU]    Clinical Course User Index [SU] Odell Balls, PA-C                                 Medical Decision Making Amount and/or Complexity of  Data Reviewed Labs: ordered.  Risk Decision regarding hospitalization.        Final diagnoses:  Depression, unspecified depression type    ED Discharge Orders     None          Odell Balls, PA-C 03/26/24 9297  "

## 2024-03-21 NOTE — ED Triage Notes (Signed)
 Patient arrived POV from home with complaint of generalized fatigue x months, insomnia & SI thoughts.   Patient stated I can't seem to get no help so Im about ready to end it.

## 2024-03-22 ENCOUNTER — Ambulatory Visit

## 2024-03-22 ENCOUNTER — Inpatient Hospital Stay
Admission: AD | Admit: 2024-03-22 | Discharge: 2024-03-25 | DRG: 885 | Disposition: A | Discharge status: Home / Self Care | Source: Intra-hospital | Attending: Psychiatry | Admitting: Psychiatry

## 2024-03-22 ENCOUNTER — Encounter: Payer: Self-pay | Admitting: Child & Adolescent Psychiatry

## 2024-03-22 DIAGNOSIS — F419 Anxiety disorder, unspecified: Secondary | ICD-10-CM | POA: Diagnosis present

## 2024-03-22 DIAGNOSIS — Z79899 Other long term (current) drug therapy: Secondary | ICD-10-CM | POA: Diagnosis not present

## 2024-03-22 DIAGNOSIS — F332 Major depressive disorder, recurrent severe without psychotic features: Secondary | ICD-10-CM | POA: Diagnosis present

## 2024-03-22 DIAGNOSIS — R45851 Suicidal ideations: Secondary | ICD-10-CM | POA: Diagnosis present

## 2024-03-22 DIAGNOSIS — F5104 Psychophysiologic insomnia: Secondary | ICD-10-CM | POA: Diagnosis present

## 2024-03-22 DIAGNOSIS — Z96652 Presence of left artificial knee joint: Secondary | ICD-10-CM | POA: Diagnosis present

## 2024-03-22 DIAGNOSIS — Z823 Family history of stroke: Secondary | ICD-10-CM

## 2024-03-22 DIAGNOSIS — Z808 Family history of malignant neoplasm of other organs or systems: Secondary | ICD-10-CM | POA: Diagnosis not present

## 2024-03-22 DIAGNOSIS — Z811 Family history of alcohol abuse and dependence: Secondary | ICD-10-CM

## 2024-03-22 DIAGNOSIS — E785 Hyperlipidemia, unspecified: Secondary | ICD-10-CM | POA: Diagnosis present

## 2024-03-22 DIAGNOSIS — Z83438 Family history of other disorder of lipoprotein metabolism and other lipidemia: Secondary | ICD-10-CM

## 2024-03-22 DIAGNOSIS — Z85828 Personal history of other malignant neoplasm of skin: Secondary | ICD-10-CM | POA: Diagnosis not present

## 2024-03-22 DIAGNOSIS — G4733 Obstructive sleep apnea (adult) (pediatric): Secondary | ICD-10-CM | POA: Diagnosis present

## 2024-03-22 DIAGNOSIS — Z825 Family history of asthma and other chronic lower respiratory diseases: Secondary | ICD-10-CM | POA: Diagnosis not present

## 2024-03-22 DIAGNOSIS — G894 Chronic pain syndrome: Secondary | ICD-10-CM | POA: Diagnosis present

## 2024-03-22 DIAGNOSIS — I10 Essential (primary) hypertension: Secondary | ICD-10-CM | POA: Diagnosis present

## 2024-03-22 DIAGNOSIS — G8929 Other chronic pain: Secondary | ICD-10-CM | POA: Diagnosis not present

## 2024-03-22 DIAGNOSIS — Z981 Arthrodesis status: Secondary | ICD-10-CM

## 2024-03-22 DIAGNOSIS — G2581 Restless legs syndrome: Secondary | ICD-10-CM | POA: Diagnosis present

## 2024-03-22 DIAGNOSIS — Z801 Family history of malignant neoplasm of trachea, bronchus and lung: Secondary | ICD-10-CM

## 2024-03-22 DIAGNOSIS — K219 Gastro-esophageal reflux disease without esophagitis: Secondary | ICD-10-CM | POA: Diagnosis present

## 2024-03-22 DIAGNOSIS — Z8249 Family history of ischemic heart disease and other diseases of the circulatory system: Secondary | ICD-10-CM | POA: Diagnosis not present

## 2024-03-22 DIAGNOSIS — Z818 Family history of other mental and behavioral disorders: Secondary | ICD-10-CM

## 2024-03-22 DIAGNOSIS — F322 Major depressive disorder, single episode, severe without psychotic features: Principal | ICD-10-CM | POA: Diagnosis present

## 2024-03-22 DIAGNOSIS — F329 Major depressive disorder, single episode, unspecified: Secondary | ICD-10-CM | POA: Diagnosis not present

## 2024-03-22 LAB — RESP PANEL BY RT-PCR (RSV, FLU A&B, COVID)  RVPGX2
Influenza A by PCR: NEGATIVE
Influenza B by PCR: NEGATIVE
Resp Syncytial Virus by PCR: NEGATIVE
SARS Coronavirus 2 by RT PCR: NEGATIVE

## 2024-03-22 MED ORDER — SERTRALINE HCL 25 MG PO TABS: 150.0000 mg | ORAL_TABLET | Freq: Every day | ORAL | Status: DC

## 2024-03-22 MED ORDER — OLANZAPINE 5 MG PO TBDP: 5.0000 mg | ORAL_TABLET | Freq: Three times a day (TID) | ORAL | Status: DC | PRN

## 2024-03-22 MED ORDER — DIAZEPAM 5 MG/ML IJ SOLN: 5.0000 mg | Freq: Four times a day (QID) | INTRAMUSCULAR | Status: DC | PRN

## 2024-03-22 MED ORDER — QUETIAPINE FUMARATE 25 MG PO TABS
50.0000 mg | ORAL_TABLET | Freq: Once | ORAL | Status: AC
  Administered 2024-03-22: 50 mg via ORAL
  Filled 2024-03-22: qty 2

## 2024-03-22 MED ORDER — OLANZAPINE 10 MG IM SOLR: 5.0000 mg | Freq: Three times a day (TID) | INTRAMUSCULAR | Status: DC | PRN

## 2024-03-22 MED ORDER — QUETIAPINE FUMARATE 50 MG PO TABS
50.0000 mg | ORAL_TABLET | ORAL | Status: AC
  Administered 2024-03-22: 50 mg via ORAL
  Filled 2024-03-22: qty 1

## 2024-03-22 MED ORDER — LORAZEPAM 1 MG PO TABS
0.5000 mg | ORAL_TABLET | ORAL | Status: DC | PRN
  Administered 2024-03-22 (×2): 0.5 mg via ORAL
  Filled 2024-03-22 (×2): qty 1

## 2024-03-22 MED ORDER — ZIPRASIDONE MESYLATE 20 MG IM SOLR
5.0000 mg | Freq: Four times a day (QID) | INTRAMUSCULAR | Status: DC | PRN
  Administered 2024-03-22: 5 mg via INTRAMUSCULAR
  Filled 2024-03-22: qty 20

## 2024-03-22 MED ORDER — SERTRALINE HCL 50 MG PO TABS
150.0000 mg | ORAL_TABLET | Freq: Every day | ORAL | Status: DC
  Administered 2024-03-22 – 2024-03-24 (×3): 150 mg via ORAL
  Filled 2024-03-22 (×3): qty 3

## 2024-03-22 MED ORDER — PANTOPRAZOLE SODIUM 40 MG PO TBEC
40.0000 mg | DELAYED_RELEASE_TABLET | Freq: Every day | ORAL | Status: DC
  Administered 2024-03-23: 40 mg via ORAL
  Filled 2024-03-22: qty 1

## 2024-03-22 MED ORDER — HYDROCHLOROTHIAZIDE 25 MG PO TABS
25.0000 mg | ORAL_TABLET | Freq: Every day | ORAL | Status: DC
  Administered 2024-03-23 – 2024-03-25 (×3): 25 mg via ORAL
  Filled 2024-03-22 (×3): qty 1

## 2024-03-22 MED ORDER — LOSARTAN POTASSIUM 25 MG PO TABS
50.0000 mg | ORAL_TABLET | Freq: Two times a day (BID) | ORAL | Status: DC
  Administered 2024-03-22 – 2024-03-25 (×6): 50 mg via ORAL
  Filled 2024-03-22 (×6): qty 2

## 2024-03-22 NOTE — ED Notes (Signed)
 Patient provided with cereal and milk for breakfast. Patient states no other needs at this time.

## 2024-03-22 NOTE — ED Notes (Addendum)
 Safe transport arrived. Pt belongings secured and given to safe transport. Family informed on [facility] visiting hours.

## 2024-03-22 NOTE — Progress Notes (Signed)
 Pt was accepted to [facility] 03/22/2024 Bed Assignment L37  Address: [address] [place], [place], [place] [address]  [facility] Fax: [phone]  Pt meets inpatient criteria per: Adriana Pontes MD  Attending Physician will be Dr. Allyn Donnelly COME  Report can be called to: [phone]  Pt can arrive after: House Supervisor will update   Care Team notified: Cherylynn Ernst RN, Devere Mulch RN     [place] Ashten Sarnowski, MSW, LCSW 03/22/2024 11:53 AM

## 2024-03-22 NOTE — Plan of Care (Signed)
 Patient is a voluntary admission to [facility] for MD[facility] and insomnia.  Patient on the unit and orders released.

## 2024-03-22 NOTE — ED Notes (Signed)
 [facility] transport has been called

## 2024-03-22 NOTE — Tx Team (Signed)
 Initial Treatment Plan 03/22/2024 6:15 PM [name] [id number]    PATIENT STRESSORS: Health problems     PATIENT STRENGTHS: Ability for insight  Capable of independent living  Communication skills  General fund of knowledge  Motivation for treatment/growth  Religious Affiliation  Supportive family/friends    PATIENT IDENTIFIED PROBLEMS:   I haven't been able to sleep for months.                   DISCHARGE CRITERIA:  Ability to meet basic life and health needs Adequate post-discharge living arrangements Improved stabilization in mood, thinking, and/or behavior Safe-care adequate arrangements made  PRELIMINARY DISCHARGE PLAN: Return to previous living arrangement  PATIENT/FAMILY INVOLVEMENT: This treatment plan has been presented to and reviewed with the patient, Jeff Stewart. The patient has been given the opportunity to ask questions and make suggestions.  Garen CINDERELLA Daring, RN 03/22/2024, 6:15 PM

## 2024-03-22 NOTE — Progress Notes (Signed)
 Patient is a 69 year old male admitted voluntarily to the [facility] floor from [facility] with a diagnosis of MDD. Patient presents to assessment via transport chair but is ambulatory. He is A+O x4. Patient made passive suicidal statements to end his life due to ongoing insomnia, chronic pain, and lack of help for these issues. He currently endorses passive suicidal ideation but agrees to contract for safety on the unit. Patients affect is appropriate and speech is logical and coherent. Patient endorses depression and anxiety 6/10. He states that his main stressor is being told that his insomnia is pain related when he feels it is psychiatric related. Brief medical history includes: internal morphine pump that is set at a fixed rate and cannot be adjusted, sleep apnea (machine from home), and HTN. Endorses 5/10 chronic gen pain. Patient denies the use of a mobility aid at home. Wears reading glasses(at home). Reports last BM 12.31.25. Patient denies smoking cigs, consuming alcohol, or substance use. Patient states that he lives in a private residence with his spouse. Cites his family as his support system. When asked what his goals he would like to work on while admitted, patient states, to get help.  Skin assessment and body search completed with Norleen, MHT. Skin: warm/dry, midline lower back scar, right hand scratches, right shoulder pimple, skin tag behind right ear, and RUQ abd morphine pump. No contrabands found.  Emotional support and reassurance provided throughout admission intake. Consents signed. Afterwards, oriented patient to unit, room and call light, reviewed POC with all questions answered and concerns voiced. Patient verbalized understanding. Denies any needs at this time. Will continue to monitor with ongoing Q 15 minute safety checks per unit protocol.

## 2024-03-22 NOTE — ED Notes (Signed)
 Signed voluntary consent. Meal provided

## 2024-03-22 NOTE — Group Note (Signed)
 Date:  03/22/2024 Time:  8:27 PM  Group Topic/Focus:  Healthy Communication:   The focus of this group is to discuss communication, barriers to communication, as well as healthy ways to communicate with others.    Participation Level:  Active  Participation Quality:  Appropriate  Affect:  Appropriate  Cognitive:  Appropriate  Insight: Appropriate  Engagement in Group:  Engaged  Modes of Intervention:  Discussion and Orientation  Additional Comments:   Jeff Stewart 03/22/2024, 8:27 PM

## 2024-03-22 NOTE — ED Notes (Signed)
 Patient resting.

## 2024-03-22 NOTE — Consult Note (Signed)
 [facility] Consult Note  Patient Name: Jeff Stewart MRN: [id number] DOB: Dec 30, 1955 DATE OF Consult: 03/22/2024 Consult Order details:  Orders (From admission, onward)     Start     Ordered   03/21/24 2226  CONSULT TO CALL ACT TEAM       Ordering Provider: Odell Balls, PA-C  Provider:  (Not yet assigned)  Question:  Reason for Consult?  Answer:  Psych consult   03/21/24 2226   03/21/24 2226  IP CONSULT TO PSYCHIATRY       Ordering Provider: Odell Balls, PA-C  Provider:  (Not yet assigned)  Question:  Reason for consult:  Answer:  Medication management   03/21/24 2226            PRIMARY PSYCHIATRIC DIAGNOSES   1.  Major Depression, Recurrent, Severe without Psychotic Features 2.  Chronic Pain Syndrome 3.  Rule out comorbid sleep disorder   RECOMMENDATIONS  Recommendations: Medication recommendations: Give Seroquel 50 mg po now for mood-related anxiety/sleep.  If he is not asleep in 90 minutes, may repeat dose x 1. Monitor orthostatic vitals to make sure not a significant drop in BP, increase in HR.  If QTC is >500 mg, then use Ativan 0.5 mg q4h PRN anxiety and/or sleep (but only if cannot use Seroquel).  Continue Zoloft  150 mg at bedtime for anxiety/depression (has already had his dosage for 1/1);  For severe agitation/aggression, Geodon, 5 mg IM q6h PRN and Valium  5 mg IM q6h PRN. Non-Medication/therapeutic recommendations: Given patient's suicidal thoughts, he continues to need close observation until he can be admitted safely to a geropsychiatric unit.  Continue with matter-of-fact emotional support in ED, pending transfer Is inpatient psychiatric hospitalization recommended for this patient? Yes (Explain why): Patient with serious suicidal ideation (his father shot himself because of similar problems), with marked depression.  He wishes to be admitted voluntarily, but he meets IVC criteria.  If he were to request AMA, then strongly recommend that IVC process be  begun.  If at all possible, strongly recommend that patient be referred to a geropsychiatric unit to maximize treatment resources.  I did notify family that this could not be guaranteed. Is another care setting recommended for this patient? (examples may include Crisis Stabilization Unit, Residential/Recovery Treatment, ALF/SNF, Memory Care Unit)  No (Explain why): As above From a psychiatric perspective, is this patient appropriate for discharge to an outpatient setting/resource or other less restrictive environment for continued care?  No (Explain why): As above Follow-Up Telepsychiatry C/L services: We will sign off for now. Please re-consult our service if needed for any concerning changes in the patient's condition, discharge planning, or questions. Communication: Treatment team members (and family members if applicable) who were involved in treatment/care discussions and planning, and with whom we spoke or engaged with via secure text/chat, include the following: Secure message to Ms.Upstill, PA-C and Dr. Gennaro, ED providers, and ED staff, outlining recommendations   I personally spent a total of 45 minutes in the care of the patient today including preparing to see the patient, getting/reviewing separately obtained history, performing a medically appropriate exam/evaluation, counseling and educating, placing orders, referring and communicating with other health care professionals, documenting clinical information in the EHR, independently interpreting results, communicating results, and coordinating care.  Thank you for involving us  in the care of this patient. If you have any additional questions or concerns, please call [phone] and ask for the provider on-call.   TELEPSYCHIATRY ATTESTATION & CONSENT  As the provider for  this telehealth consult, I attest that I verified the patients identity using two separate identifiers, introduced myself to the patient, provided my credentials,  disclosed my location, and performed this encounter via a HIPAA-compliant, real-time, face-to-face, two-way, interactive audio and video platform and with the full consent and agreement of the patient (or guardian as applicable.)   Patient physical location: ED, [address] [address]. Telehealth provider physical location: home office in state of [place] .  Video start time: 0100h EST  Video end time: 0130h EST    IDENTIFYING DATA  Jeff Stewart is a 69 y.o. year-old male for whom a psychiatric consultation has been ordered by the primary provider. The patient was identified using two separate identifiers.  CHIEF COMPLAINT/REASON FOR CONSULT   I can't keep going on like this.  I just want to kill myself and get it over with.   HISTORY OF PRESENT ILLNESS (HPI)   The patient presents with long history of depression treatment, but now markedly exacerbated by severe sleep disturbance.  Patient did not have history of mental health problems until about 15 years ago, around the time that he had a morphine pump installed in his back.  Was put on Zoloft  150 mg every day, and he has continued on that same dose since then.  Has had several procedure son his back in the ensuing years, and after one of them, about a year ago, he began having increasingly problematic sleep issues.  He would be awake sometimes for up to 72-96 hours, then sleep almost 24 hours, and then continue with some more insomnia.  Recently, the 3-4 day cycles of insomnia (Per him, complete) have been increasingly frequent.  Patient did have a sleep apnea evaluation done, and he was placed on his CPAP, and his 02 sats during the night are much better.  Nonetheless, his sleep problems have continued.  Recently was tried on Lunesta , then hydroxyzine, but neither of these were helpful.  In the meantime, his depression worsened, and he began having suicidal thoughts, which have become quite prominent in the past few days.  Patient's  father shot himself in 1989 as a result of similar late-life issues (patient had to give up his work in an [employer] because of pain issues).  Family instructed to secure all firearms away from patient.    Patient denies homicidal ideation.  No psychotic sx's.  No drug/EtOH abuse, and UDS positive only for prescribed opiates.   PAST PSYCHIATRIC HISTORY  As above Otherwise as per HPI above.  PAST MEDICAL HISTORY  Past Medical History:  Diagnosis Date   Anxiety    Carpal tunnel syndrome, bilateral    Cataract    removed years ago   Chronic back pain    internal morphine pump   DDD (degenerative disc disease)    neck, lumbar   Depression    Dyslipidemia    diet controlled   GERD (gastroesophageal reflux disease)    past hx- had nissen fundiplication    Hypertension    borderline   Sleep apnea    wears C-PAP   Status post insertion of spinal cord stimulator      HOME MEDICATIONS  Facility Ordered Medications  Medication   hydrochlorothiazide  (HYDRODIURIL ) tablet 25 mg   losartan  (COZAAR ) tablet 50 mg   pantoprazole  (PROTONIX ) EC tablet 40 mg   sertraline  (ZOLOFT ) tablet 150 mg   QUEtiapine (SEROQUEL) tablet 50 mg   LORazepam (ATIVAN) tablet 0.5 mg   ziprasidone (GEODON) injection 5 mg  diazepam  (VALIUM ) injection 5 mg   PTA Medications  Medication Sig   acetaminophen  (TYLENOL ) 500 MG tablet Take 500 mg by mouth every 6 (six) hours as needed.   fluticasone  (FLONASE ) 50 MCG/ACT nasal spray Place 2 sprays into both nostrils daily.   Syringe/Needle, Disp, (SYRINGE 3CC/21GX1-1/4) 21G X 1-1/4 3 ML MISC 1 each by Does not apply route once a week.   Cholecalciferol (VITAMIN D ) 50 MCG (2000 UT) CAPS Take 2,000 Units by mouth daily with lunch.   furosemide  (LASIX ) 40 MG tablet Take 1 tablet (40 mg total) by mouth every morning. (Patient not taking: Reported on 03/19/2024)   hydrochlorothiazide  (HYDRODIURIL ) 25 MG tablet Take 1 tablet (25 mg total) by mouth daily. TAKE 1  TABLET DAILY.   losartan  (COZAAR ) 50 MG tablet Take 1 tablet (50 mg total) by mouth 2 (two) times daily.   pantoprazole  (PROTONIX ) 40 MG tablet Take 1 tablet (40 mg total) by mouth daily.   sertraline  (ZOLOFT ) 100 MG tablet Take 1.5 tablets (150 mg total) by mouth daily.   celecoxib  (CELEBREX ) 200 MG capsule Take 1 capsule (200 mg total) by mouth daily. (Patient not taking: Reported on 03/19/2024)   NON FORMULARY Morphine pain pump   testosterone  cypionate (DEPOTESTOSTERONE CYPIONATE) 200 MG/ML injection INJECT 0.5 MLS EVERY 10 DAYS   eszopiclone  (LUNESTA ) 2 MG TABS tablet Take 1 tablet (2 mg total) by mouth at bedtime as needed for sleep. Take immediately before bedtime and do not use more than two nights per week (Patient not taking: Reported on 03/19/2024)   ondansetron  (ZOFRAN -ODT) 4 MG disintegrating tablet Take 1 tablet (4 mg total) by mouth every 8 (eight) hours as needed for nausea or vomiting.   hydrOXYzine (VISTARIL) 25 MG capsule Take 1 capsule (25 mg total) by mouth at bedtime as needed (sleep, may repeat in one hour).   As above  ALLERGIES  Allergies[1]  SOCIAL & SUBSTANCE USE HISTORY  Social History   Socioeconomic History   Marital status: Married    Spouse name: Not on file   Number of children: 3   Years of education: Not on file   Highest education level: GED or equivalent  Occupational History   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  Tobacco Use   Smoking status: Never   Smokeless tobacco: Never  Vaping Use   Vaping status: Never Used  Substance and Sexual Activity   Alcohol use: No   Drug use: No   Sexual activity: Not Currently    Birth control/protection: Post-menopausal    Comment: married for 1972  Other Topics Concern   Not on file  Social History Narrative   Lives with wife    Children live nearby   Retired    1-2 cup of coffee    Social Drivers of Health   Tobacco Use: Low Risk (03/21/2024)   Patient History    Smoking Tobacco Use:  Never    Smokeless Tobacco Use: Never    Passive Exposure: Not on file  Financial Resource Strain: Low Risk (03/19/2024)   Overall Financial Resource Strain (CARDIA)    Difficulty of Paying Living Expenses: Not hard at all  Food Insecurity: No Food Insecurity (03/19/2024)   Epic    Worried About Radiation Protection Practitioner of Food in the Last Year: Never true    Ran Out of Food in the Last Year: Never true  Transportation Needs: No Transportation Needs (03/19/2024)   Epic    Lack of Transportation (Medical): No  Lack of Transportation (Non-Medical): No  Physical Activity: Insufficiently Active (03/19/2024)   Exercise Vital Sign    Days of Exercise per Week: 1 day    Minutes of Exercise per Session: 20 min  Stress: No Stress Concern Present (03/19/2024)   [employer] of Occupational Health - Occupational Stress Questionnaire    Feeling of Stress: Only a little  Social Connections: Socially Integrated (03/19/2024)   Social Connection and Isolation Panel    Frequency of Communication with Friends and Family: More than three times a week    Frequency of Social Gatherings with Friends and Family: Once a week    Attends Religious Services: More than 4 times per year    Active Member of Clubs or Organizations: Yes    Attends Banker Meetings: More than 4 times per year    Marital Status: Married  Depression (PHQ2-9): Low Risk (03/19/2024)   Depression (PHQ2-9)    PHQ-2 Score: 4  Alcohol Screen: Low Risk (08/07/2023)   Alcohol Screen    Last Alcohol Screening Score (AUDIT): 0  Housing: Unknown (03/19/2024)   Epic    Unable to Pay for Housing in the Last Year: No    Number of Times Moved in the Last Year: Not on file    Homeless in the Last Year: No  Utilities: Not At Risk (08/17/2023)   Received from [facility] Utilities    Threatened with loss of utilities: No  Health Literacy: Adequate Health Literacy (08/07/2023)   B1300 Health Literacy    Frequency of need for  help with medical instructions: Never   Tobacco Use History[2] Social History   Substance and Sexual Activity  Alcohol Use No   Social History   Substance and Sexual Activity  Drug Use No   .  FAMILY HISTORY  Family History  Problem Relation Age of Onset   Stroke Mother 76   CAD Father 86   Emphysema Father    CAD Sister    Hypertension Sister    Hyperlipidemia Sister    Cancer Sister        melanoma   Cancer Sister    Lung cancer Sister    CAD Brother 58       CABG   Alcohol abuse Brother    Hypertension Brother    Stroke Brother    CAD Brother    Cirrhosis Brother    Alcohol abuse Brother    Hypertension Brother    Cancer Brother    Lung cancer Brother    Cancer Brother    Lung cancer Brother    Migraines Other    Colon cancer Neg Hx    Colon polyps Neg Hx    Esophageal cancer Neg Hx    Rectal cancer Neg Hx    Stomach cancer Neg Hx    Seizures Neg Hx    Family Psychiatric History (if known):  Strong family history of substance abuse.  Father committed suicide by gunshot.   MENTAL STATUS EXAM (MSE)  Mental Status Exam: General Appearance: Fairly Groomed  Orientation:  Full (Time, Place, and Person)  Memory:  Immediate;   Good Recent;   Good Remote;   Good  Concentration:  Concentration: Fair and Attention Span: Fair  Recall:  Good  Attention  Fair  Eye Contact:  Fair  Speech:  Clear and Coherent and Normal Rate  Language:  Good  Volume:  Normal  Mood: I can't take this much longer  Affect:  Depressed  and Flat  Thought Process:  Coherent  Thought Content:  Logical  Suicidal Thoughts:  Yes.  with intent/plan  Homicidal Thoughts:  No  Judgement:  Fair  Insight:  Fair  Psychomotor Activity:  Restlessness  Akathisia:  No  Fund of Knowledge:  Good    Assets:  Communication Skills Desire for Improvement Financial Resources/Insurance Housing Intimacy Social Support  Cognition:  WNL  ADL's:  Intact  AIMS (if indicated):       VITALS   Blood pressure (!) 147/96, pulse 87, temperature 97.9 F (36.6 C), resp. rate 18, height 6' 1 (1.854 m), weight 109.3 kg, SpO2 100%.  LABS  Admission on 03/21/2024  Component Date Value Ref Range Status   Sodium 03/21/2024 137  135 - 145 mmol/L Final   Potassium 03/21/2024 3.4 (L)  3.5 - 5.1 mmol/L Final   Chloride 03/21/2024 100  98 - 111 mmol/L Final   CO2 03/21/2024 22  22 - 32 mmol/L Final   Glucose, Bld 03/21/2024 100 (H)  70 - 99 mg/dL Final   Glucose reference range applies only to samples taken after fasting for at least 8 hours.   BUN 03/21/2024 22  8 - 23 mg/dL Final   Creatinine, Ser 03/21/2024 0.87  0.61 - 1.24 mg/dL Final   Calcium 98/98/7973 9.7  8.9 - 10.3 mg/dL Final   Total Protein 98/98/7973 7.1  6.5 - 8.1 g/dL Final   Albumin 98/98/7973 4.4  3.5 - 5.0 g/dL Final   AST 98/98/7973 16  15 - 41 U/L Final   ALT 03/21/2024 18  0 - 44 U/L Final   Alkaline Phosphatase 03/21/2024 86  38 - 126 U/L Final   Total Bilirubin 03/21/2024 0.8  0.0 - 1.2 mg/dL Final   GFR, Estimated 03/21/2024 >60  >60 mL/min Final   Comment: (NOTE) Calculated using the CKD-EPI Creatinine Equation (2021)    Anion gap 03/21/2024 15  5 - 15 Final   Performed at [facility], [address] [place], [place] [address]   WBC 03/21/2024 9.4  4.0 - 10.5 K/uL Final   RBC 03/21/2024 5.24  4.22 - 5.81 MIL/uL Final   Hemoglobin 03/21/2024 15.4  13.0 - 17.0 g/dL Final   HCT 98/98/7973 44.2  39.0 - 52.0 % Final   MCV 03/21/2024 84.4  80.0 - 100.0 fL Final   MCH 03/21/2024 29.4  26.0 - 34.0 pg Final   MCHC 03/21/2024 34.8  30.0 - 36.0 g/dL Final   RDW 98/98/7973 12.9  11.5 - 15.5 % Final   Platelets 03/21/2024 237  150 - 400 K/uL Final   nRBC 03/21/2024 0.0  0.0 - 0.2 % Final   Performed at [facility], [address] [place], [place] [address]   Color, Urine 03/21/2024 STRAW (A)  YELLOW Final   APPearance 03/21/2024 CLEAR  CLEAR Final   Specific Gravity, Urine  03/21/2024 1.020  1.005 - 1.030 Final   pH 03/21/2024 6.0  5.0 - 8.0 Final   Glucose, UA 03/21/2024 NEGATIVE  NEGATIVE mg/dL Final   Hgb urine dipstick 03/21/2024 NEGATIVE  NEGATIVE Final   Bilirubin Urine 03/21/2024 NEGATIVE  NEGATIVE Final   Ketones, ur 03/21/2024 TRACE (A)  NEGATIVE mg/dL Final   Protein, ur 98/98/7973 NEGATIVE  NEGATIVE mg/dL Final   Nitrite 98/98/7973 NEGATIVE  NEGATIVE Final   Leukocytes,Ua 03/21/2024 NEGATIVE  NEGATIVE Final   Comment: Microscopic not done on urines with negative protein, blood, leukocytes, nitrite, or glucose < 500 mg/dL. Performed at [facility], [address] [place], [place], [place] [address]    Alcohol, Ethyl (B) 03/21/2024 <15  <15 mg/dL Final   Comment: (NOTE) For medical purposes only. Performed at [facility], [address], [place], [place] [address]    Opiates 03/21/2024 POSITIVE (A)  NEGATIVE Final   Cocaine  03/21/2024 NEGATIVE  NEGATIVE Final   Benzodiazepines 03/21/2024 NEGATIVE  NEGATIVE Final   Amphetamines 03/21/2024 NEGATIVE  NEGATIVE Final   Tetrahydrocannabinol 03/21/2024 NEGATIVE  NEGATIVE Final   Barbiturates 03/21/2024 NEGATIVE  NEGATIVE Final   Methadone  Scn, Ur 03/21/2024 NEGATIVE  NEGATIVE Final   Fentanyl  03/21/2024 NEGATIVE  NEGATIVE Final   Comment: (NOTE) Drug screen is for Medical Purposes only. Positive results are preliminary only. If confirmation is needed, notify lab within 5 days.  Drug Class                 Cutoff (ng/mL) Amphetamine and metabolites 1000 Barbiturate and metabolites 200 Benzodiazepine              200 Opiates and metabolites     300 Cocaine  and metabolites     300 THC                         50 Fentanyl                     5 Methadone                    300  Trazodone is metabolized in vivo to several metabolites,  including pharmacologically active m-CPP, which is excreted in the  urine.  Immunoassay screens for amphetamines and MDMA have  potential  cross-reactivity with these compounds and may provide false positive  result.  Performed at [facility], [address] [place], [place] [address]     PSYCHIATRIC REVIEW OF SYSTEMS (ROS)  ROS: Notable for the following relevant positive findings: Review of Systems  Constitutional: Negative.   HENT: Negative.    Eyes: Negative.   Respiratory: Negative.    Cardiovascular: Negative.   Gastrointestinal: Negative.   Genitourinary: Negative.   Musculoskeletal:  Positive for back pain.  Skin: Negative.   Neurological: Negative.   Endo/Heme/Allergies: Negative.   Psychiatric/Behavioral:  Positive for depression and suicidal ideas. The patient is nervous/anxious and has insomnia.     Additional findings:      Musculoskeletal: No abnormal movements observed      Gait & Station: Laying/Sitting      Pain Screening: Present - mild to moderate      Nutrition & Dental Concerns:  Reviewed   RISK FORMULATION/ASSESSMENT  Is the patient experiencing any suicidal or homicidal ideations: Yes       Explain if yes:   Patient struggling with intense suicidal thoughts that have been far worse than ever has been.  Patient has access to firearms, but family asked to secure them away from his access.  Patient's father shot himself to date at a similar life stage.  Protective factors considered for safety management:   Family supportive, but sx's have increased to a point that he cannot be safely managed at home  Risk factors/concerns considered for safety management:  Family history of suicide Depression Physical illness/chronic pain Access to lethal means Age over 76 Hopelessness Barriers to accessing treatment Male gender  Is there a safety management plan with the patient and treatment team to minimize risk factors and promote protective factors: No  Explain: As above, family supportive, but sx's are quite severe.  Is crisis care placement or  psychiatric hospitalization recommended: Yes     Based on my current evaluation and risk assessment, patient is determined at this time to be at:  High risk  *RISK ASSESSMENT Risk assessment is a dynamic process; it is possible that this patient's condition, and risk level, may change. This should be re-evaluated and managed over time as appropriate. Please re-consult psychiatric consult services if additional assistance is needed in terms of risk assessment and management. If your team decides to discharge this patient, please advise the patient how to best access emergency psychiatric services, or to call 911, if their condition worsens or they feel unsafe in any way.   Adriana JINNY Pontes, MD Telepsychiatry Consult Services    [1]  Allergies Allergen Reactions   Nalbuphine Nausea And Vomiting, Rash and Shortness Of Breath    nalbuphine   Nubain [Nalbuphine Hcl] Rash  [2]  Social History Tobacco Use  Smoking Status Never  Smokeless Tobacco Never

## 2024-03-22 NOTE — Group Note (Signed)
 Recreation Therapy Group Note   Group Topic:Emotion Expression  Group Date: 03/22/2024 Start Time: 1400 End Time: 1430 Facilitators: Celestia Jeoffrey BRAVO, LRT, CTRS Location: Courtyard  Group Description: Music. Patients are encouraged to name their favorite song(s) for LRT to play song through speaker for group to hear, while in the courtyard getting fresh air and sunlight. Patients educated on the definition of leisure and the importance of having different leisure interests outside of the hospital. Group discussed how leisure activities can often be used as pharmacologist and that listening to music and being outside are examples.    Goal Area(s) Addressed:  Patient will identify a current leisure interest.  Patient will practice making a positive decision. Patient will have the opportunity to try a new leisure activity.   Affect/Mood: N/A   Participation Level: Did not attend    Clinical Observations/Individualized Feedback: Patient did not attend due to not being on the unit yet.  Plan: Continue to engage patient in RT group sessions 2-3x/week.   Jeoffrey BRAVO Celestia, LRT, CTRS 03/22/2024 4:49 PM

## 2024-03-22 NOTE — ED Provider Notes (Addendum)
 Emergency Medicine Observation Re-evaluation Note  Jeff Stewart is a 69 y.o. male, seen on rounds today.  Pt initially presented to the ED for complaints of Suicidal, Fatigue, and Insomnia Currently, the patient is SI for admission to behavioral health.  Currently voluntary.  Physical Exam  BP (!) 147/96 (BP Location: Right Arm)   Pulse 87   Temp 97.9 F (36.6 C)   Resp 18   Ht 1.854 m (6' 1)   Wt 109.3 kg   SpO2 100%   BMI 31.80 kg/m  Physical Exam General: WDWN Cardiac: rrr Lungs: normal rr and sats Psych: calm  ED Course / MDM  EKG:EKG Interpretation Date/Time:  Thursday March 21 2024 20:18:08 EST Ventricular Rate:  81 PR Interval:  160 QRS Duration:  92 QT Interval:  370 QTC Calculation: 429 R Axis:   -43  Text Interpretation: Normal sinus rhythm Left axis deviation Minimal voltage criteria for LVH, may be normal variant ( R in aVL ) Age indeterminate anterior infarct Compared with prior EKG from 03/20/2023 Confirmed by Gennaro Bouchard ([id number]) on 03/21/2024 8:34:11 PM  I have reviewed the labs performed to date as well as medications administered while in observation.  Recent changes in the last 24 hours include medically cleared for psych admit.  Plan  Current plan is for psychiatric hospital. 11:33 AM Has been s accepted at [facility] geriatric psychiatric unit. He is awake and conversant with me.  He appears to be medically stable for transfer Levander Houston, MD 03/22/24 [id number]    Levander Houston, MD 03/22/24 [phone]

## 2024-03-23 DIAGNOSIS — F329 Major depressive disorder, single episode, unspecified: Secondary | ICD-10-CM

## 2024-03-23 DIAGNOSIS — G8929 Other chronic pain: Secondary | ICD-10-CM | POA: Diagnosis not present

## 2024-03-23 DIAGNOSIS — F5104 Psychophysiologic insomnia: Secondary | ICD-10-CM | POA: Diagnosis not present

## 2024-03-23 LAB — LIPID PANEL
Cholesterol: 173 mg/dL (ref 0–200)
HDL: 44 mg/dL
LDL Cholesterol: 110 mg/dL — ABNORMAL HIGH (ref 0–99)
Total CHOL/HDL Ratio: 3.9 ratio
Triglycerides: 97 mg/dL
VLDL: 19 mg/dL (ref 0–40)

## 2024-03-23 LAB — HEMOGLOBIN A1C
Hgb A1c MFr Bld: 5.5 % (ref 4.8–5.6)
Mean Plasma Glucose: 111.15 mg/dL

## 2024-03-23 LAB — TSH: TSH: 1.33 u[IU]/mL (ref 0.350–4.500)

## 2024-03-23 MED ORDER — QUETIAPINE FUMARATE 25 MG PO TABS
50.0000 mg | ORAL_TABLET | Freq: Once | ORAL | Status: AC
  Administered 2024-03-23: 50 mg via ORAL
  Filled 2024-03-23: qty 2

## 2024-03-23 MED ORDER — LORAZEPAM 1 MG PO TABS
1.0000 mg | ORAL_TABLET | Freq: Four times a day (QID) | ORAL | Status: DC | PRN
  Administered 2024-03-23 – 2024-03-24 (×3): 1 mg via ORAL
  Filled 2024-03-23 (×3): qty 1

## 2024-03-23 MED ORDER — PANTOPRAZOLE SODIUM 40 MG PO TBEC
40.0000 mg | DELAYED_RELEASE_TABLET | Freq: Every day | ORAL | Status: DC
  Administered 2024-03-24 – 2024-03-25 (×2): 40 mg via ORAL
  Filled 2024-03-23 (×2): qty 1

## 2024-03-23 MED ORDER — QUETIAPINE FUMARATE 25 MG PO TABS
50.0000 mg | ORAL_TABLET | Freq: Every evening | ORAL | Status: DC | PRN
  Administered 2024-03-24 (×3): 50 mg via ORAL
  Filled 2024-03-23 (×3): qty 2

## 2024-03-23 NOTE — [facility] Suicide Risk Assessment (Signed)
 [facility] Admission Suicide Risk Assessment   Nursing information obtained from:  Patient Demographic factors:  Age 69 or older, Male, Caucasian Current Mental Status:  NA Loss Factors:  Decline in physical health Historical Factors:  Family history of suicide Risk Reduction Factors:  Sense of responsibility to family, Religious beliefs about death, Living with another person, especially a relative  Total Time spent with patient: {Time; 15 min - 8 hours:17441} Principal Problem: MDD (major depressive disorder), severe (HCC) Diagnosis:  Principal Problem:   MDD (major depressive disorder), severe (HCC)  Subjective Data: ***  Continued Clinical Symptoms:  Alcohol Use Disorder Identification Test Final Score (AUDIT): 0 The Alcohol Use Disorders Identification Test, Guidelines for Use in Primary Care, Second Edition.  World Science Writer [facility]). Score between 0-7:  no or low risk or alcohol related problems. Score between 8-15:  moderate risk of alcohol related problems. Score between 16-19:  high risk of alcohol related problems. Score 20 or above:  warrants further diagnostic evaluation for alcohol dependence and treatment.   CLINICAL FACTORS:   {Clinical Factors:22706}   Musculoskeletal: Strength & Muscle Tone: {desc; muscle tone:32375} Gait & Station: {PE GAIT ED WJUO:77474} Patient leans: {Patient Leans:21022755}  Psychiatric Specialty Exam:  Presentation  General Appearance: No data recorded Eye Contact:No data recorded Speech:No data recorded Speech Volume:No data recorded Handedness:No data recorded  Mood and Affect  Mood:No data recorded Affect:No data recorded  Thought Process  Thought Processes:No data recorded Descriptions of Associations:No data recorded Orientation:No data recorded Thought Content:No data recorded History of Schizophrenia/Schizoaffective disorder:No data recorded Duration of Psychotic Symptoms:No data recorded Hallucinations:No data  recorded Ideas of Reference:No data recorded Suicidal Thoughts:No data recorded Homicidal Thoughts:No data recorded  Sensorium  Memory:No data recorded Judgment:No data recorded Insight:No data recorded  Executive Functions  Concentration:No data recorded Attention Span:No data recorded Recall:No data recorded Fund of Knowledge:No data recorded Language:No data recorded  Psychomotor Activity  Psychomotor Activity:No data recorded  Assets  Assets:No data recorded  Sleep  Sleep:No data recorded   Physical Exam: Physical Exam ROS Blood pressure 135/84, pulse 80, temperature 99 F (37.2 C), resp. rate 16, height 6' 1 (1.854 m), weight 107.3 kg, SpO2 100%. Body mass index is 31.2 kg/m.   COGNITIVE FEATURES THAT CONTRIBUTE TO RISK:  {Cognitive Features:304700251}    SUICIDE RISK:   {BHH SUICIDE RISK:22704}  PLAN OF CARE: ***  I certify that inpatient services furnished can reasonably be expected to improve the patient's condition.   Millie JONELLE Manners, MD 03/23/2024, 2:05 PM

## 2024-03-23 NOTE — H&P (Signed)
 " CHIEF COMPLAINT  I havent been sleeping for a year, and I cant take it anymore.  HISTORY OF PRESENT ILLNESS  Jeff Stewart is a 69 year old male with a history of chronic pain managed with an implanted morphine pump, obstructive sleep apnea on BiPAP, and long-standing depression treated with sertraline , who presents for psychiatric inpatient admission due to severe, chronic sleep disturbance with worsening depression and emergent suicidal ideation.  The patient reports approximately one year of profound sleep dysregulation, characterized by cycles in which he remains awake for 72-96 hours, followed by periods of hypersomnia lasting up to 24 hours, then recurrent insomnia. These cycles have become increasingly frequent and debilitating over recent months. During prolonged insomnia, he reports pacing, inability to rest, worsening pain, emotional exhaustion, and increasing hopelessness.  He denies a lifelong psychiatric history and reports that his first depressive symptoms emerged approximately 15 years ago, temporally associated with the placement of a morphine pump for chronic back pain. At that time, he was started on sertraline  (Zoloft ) 150 mg daily, which he has remained on without dose changes since initiation.  Approximately one year ago, following a back procedure, his sleep disturbance markedly worsened. He underwent a formal sleep evaluation and was diagnosed with obstructive sleep apnea. He is compliant with BiPAP, with documented improvement in nocturnal oxygen saturation; however, his insomnia has persisted despite adequate treatment of sleep apnea.  Multiple medication trials for sleep have been unsuccessful, including Lunesta  (eszopiclone ) and hydroxyzine . He has also trialed high-dose over-the-counter melatonin without benefit. He has a prior diagnosis of restless leg syndrome, though symptoms occur only during periods of severe sleep disruption.  As his insomnia progressed,  the patient reports worsening depressive symptoms, including low mood, anhedonia, helplessness, and passive to active suicidal ideation, particularly over the past several days. He acknowledges making suicidal statements, prompting family concern and presentation for evaluation. He reports a significant family history of suicide, noting that his father died by firearm suicide in 73 in the context of late-life stressors and functional decline. Family has been instructed to secure all firearms away from the patient.  He denies homicidal ideation, psychotic symptoms, manic symptoms, or substance misuse. Urine drug screen is positive only for prescribed opioids.  Given severe insomnia refractory to outpatient management, worsening depression, suicidal ideation, and inability to maintain safety, inpatient psychiatric admission is medically necessary for stabilization, medication management, and safety monitoring.  PAST PSYCHIATRIC HISTORY  Diagnoses:  Major Depressive Disorder (by history)  Chronic insomnia  Prior Hospitalizations: None reported  Prior Suicide Attempts: Denies  Outpatient Treatment: Managed by PCP; no recent psychiatric specialty care  Psychotropic Medications:  Sertraline  150 mg daily (?15 years, no recent changes)  PAST MEDICAL HISTORY  Chronic back pain  Implanted intrathecal morphine pump  Obstructive sleep apnea (on BiPAP)  Restless leg syndrome (historical)  SURGICAL HISTORY  Multiple spinal procedures  Intrathecal morphine pump placement  MEDICATIONS ON ADMISSION  Sertraline  150 mg PO daily  Intrathecal morphine (fixed-rate pump)  BiPAP nightly  Prior ineffective trials: Lunesta , hydroxyzine , melatonin  ALLERGIES  No known drug allergies reported  SUBSTANCE USE HISTORY  Alcohol: Denies  Illicit Drugs: Denies  Tobacco: Not reported  UDS: Positive for prescribed opioids only  FAMILY PSYCHIATRIC HISTORY  Father died by suicide via  firearm in 1989  No other known psychiatric illnesses reported  SOCIAL HISTORY  Former Metallurgist; stopped working due to chronic pain  Lives with family/support system  Firearms present in family; family instructed to secure  all weapons  Significant functional decline related to pain and sleep disturbance  REVIEW OF SYSTEMS  Psychiatric: Severe insomnia, depression, suicidal ideation; denies hallucinations, delusions, or mania  Neurologic: No seizures or focal deficits reported  Respiratory: OSA on BiPAP, stable  Endocrine: Thyroid  and testosterone  previously checked and normal  Other systems: Negative except as noted above  MENTAL STATUS EXAMINATION  Appearance: Elderly male, fatigued, disheveled, appears exhausted  Behavior: Cooperative, slowed, intermittently restless  Speech: Soft, slowed, coherent  Mood: Worn out hopeless  Affect: Constricted, dysphoric  Thought Process: Linear but slowed  Thought Content: Passive to active suicidal ideation without current plan; no homicidal ideation; no delusions  Perception: No hallucinations  Cognition: Alert and oriented 3; attention impaired by sleep deprivation  Insight: Fair  Judgment: Impaired in context of suicidality and exhaustion  SUICIDE RISK ASSESSMENT  Risk Factors:  Active suicidal ideation  Severe chronic insomnia  Major depressive symptoms  Family history of suicide  Chronic pain and functional decline  Advanced age  Protective Factors:  Family involvement  Willingness to seek help  Voluntary admission  Overall Risk Level: High Disposition: Requires inpatient psychiatric hospitalization with close monitoring  ASSESSMENT  69 year old male with chronic pain and sleep apnea presenting with severe, cyclical insomnia refractory to outpatient treatment, leading to worsening major depressive symptoms and suicidal ideation. Clinical picture suggests mood destabilization  driven by profound sleep deprivation, with need to rule out medication effects, circadian rhythm disorder, and mood disorder exacerbation.  DIAGNOSES (DSM-5-TR)  Major Depressive Disorder, recurrent, severe, without psychotic features  Insomnia Disorder, severe, chronic  Chronic Pain Syndrome  Obstructive Sleep Apnea, on BiPAP  Rule out Circadian Rhythm Sleep-Wake Disorder  Rule out Medication-Induced Sleep Disorder  PLAN 1. Safety  Admit to inpatient psychiatric unit  Suicide precautions with close observation  Remove access to lethal means (family already instructed to secure firearms)  2. Medications  Restart home sertraline  150 mg daily  Initiate and titrate quetiapine  (Seroquel ) for mood stabilization and sleep  Consider lorazepam  (Ativan ) short-term for acute insomnia/anxiety if needed  Consider retrial of eszopiclone  (Lunesta ) with structured monitoring  Avoid polypharmacy unless clearly indicated; reassess daily  3. Sleep  Strict sleep-wake schedule  Continue BiPAP nightly  Monitor sleep duration and quality  4. Medical  Review pain management and morphine pump settings with pain service as needed  Review recent labs; obtain additional labs if clinically indicated  5. Psychotherapy / Milieu  Supportive therapy  Psychoeducation regarding sleep and mood  Family involvement as appropriate  6. Disposition  Estimated length of stay: 5-7 days  Goal: stabilization of sleep, resolution of suicidal ideation, safe discharge with outpatient follow-up "

## 2024-03-23 NOTE — Plan of Care (Signed)
 Patient alert and oriented x 4. Denies SI, HI, AVH. Pain 2/10 c/o pain, continuous pain med infusing via right arm. Scheduled medications per [facility] w/one time dose of 50 mg Seroquel  requested for sleep. Support and encouragement provided.  Routine safety checks conducted every 15 minutes.  Patient informed to notify staff with problems or concerns. No adverse drug reactions noted. Patient verbally contracts for safety at this time. Patient interacts well with others on the unit.  Patient remains safe at this time.  Problem: Education: Goal: Mental status will improve Outcome: Progressing   Problem: Education: Goal: Verbalization of understanding the information provided will improve Outcome: Progressing   Problem: Activity: Goal: Sleeping patterns will improve Outcome: Progressing   Problem: Health Behavior/Discharge Planning: Goal: Identification of resources available to assist in meeting health care needs will improve Outcome: Progressing Goal: Compliance with treatment plan for underlying cause of condition will improve Outcome: Progressing   Problem: Safety: Goal: Periods of time without injury will increase Outcome: Progressing

## 2024-03-23 NOTE — Group Note (Signed)
 Date:  03/23/2024 Time:  10:33 AM  Group Topic/Focus:  Coping With Mental Health Crisis:   The purpose of this group is to help patients identify strategies for coping with mental health crisis.  Group discusses possible causes of crisis and ways to manage them effectively.    Participation Level:  Active  Participation Quality:  Appropriate  Affect:  Appropriate  Cognitive:  Appropriate  Insight: Appropriate  Engagement in Group:  Engaged  Modes of Intervention:  Education  Additional Comments:  none  Jeff Stewart Bias 03/23/2024, 10:33 AM

## 2024-03-23 NOTE — Progress Notes (Signed)
" °   03/23/24 1200  Psych Admission Type (Psych Patients Only)  Admission Status Voluntary  Psychosocial Assessment  Patient Complaints Anxiety  Eye Contact Fair  Facial Expression Other (Comment) (appropriate)  Affect Appropriate to circumstance  Speech Logical/coherent  Interaction Assertive  Motor Activity Other (Comment) (WNL)  Appearance/Hygiene In scrubs  Behavior Characteristics Cooperative  Mood Pleasant  Thought Process  Coherency WDL  Content WDL  Delusions None reported or observed  Perception WDL  Hallucination None reported or observed  Judgment WDL  Confusion WDL  Danger to Self  Current suicidal ideation? Passive  Agreement Not to Harm Self Yes  Description of Agreement verbal  Danger to Others  Danger to Others None reported or observed    "

## 2024-03-23 NOTE — Progress Notes (Signed)
(  Sleep Hours) - 7.50 (Any PRNs that were needed, meds refused, or side effects to meds)- 1X order placed for Seroquel  per patient request which was giving the previous night. (Any disturbances and when (visitation, over night)- I wish I had the second pill I could take (Concerns raised by the patient)- I don't know why they wouldn't have the medication for sleep ordered from the previous night. They Psychiatrist said if I needed another dose I could have it, so I am not sure why the medication didn't roll over. (SI/HI/AVH)- Passive SI, but contracts for safety verbally. Denies HI, AVH

## 2024-03-23 NOTE — Progress Notes (Addendum)
 SI/HI/AVH: passive SI no plan/denies HI and AVH.  Behavior/Mood: cooperative/pleasant    Interaction/Group attendance:assertive/ 1 of 3 groups    Medication/PRNs: compliant/Ativan  x 1    Pain: 1-2 chronic back pain  Other: *PRN Ativan  1 mg q6h. Also, see scheduled/PRN Seroquel  order.   *Morphine pump RUQ abd.

## 2024-03-23 NOTE — Group Note (Signed)
 LCSW Group Therapy Note  Group Date: 03/23/2024 Start Time: 1032 End Time: 1110   Type of Therapy and Topic:  Group Therapy - Healthy vs Unhealthy Coping Skills  Participation Level:  Did Not Attend   Description of Group The focus of this group was to determine what unhealthy coping techniques typically are used by group members and what healthy coping techniques would be helpful in coping with various problems. Patients were guided in becoming aware of the differences between healthy and unhealthy coping techniques. Patients were asked to identify 2-3 healthy coping skills they would like to learn to use more effectively.   Therapeutic Goals Patients learned that coping is what human beings do all day long to deal with various situations in their lives Patients defined and discussed healthy vs unhealthy coping techniques Patients identified their preferred coping techniques and identified whether these were healthy or unhealthy Patients determined 2-3 healthy coping skills they would like to become more familiar with and use more often. Patients provided support and ideas to each other   Summary of Patient Progress: Patient did not attend    Therapeutic Modalities Cognitive Behavioral Therapy Motivational Interviewing  Glenn Christo W Darneisha Windhorst, LCSWA 03/23/2024  11:55 AM

## 2024-03-23 NOTE — Plan of Care (Signed)
" °  Problem: Coping: Goal: Coping ability will improve Outcome: Progressing   Problem: Self-Concept: Goal: Level of anxiety will decrease Outcome: Not Progressing   "

## 2024-03-23 NOTE — Progress Notes (Signed)
 Attempted to call Respiratory to inform of patient's C-Pap, and was unable to reach a therapist at [phone]. The hospital operator was unable to reach them as well.   Pt resting comfortably w/eyes closed and no apparent distress. Will continue to monitor and perform Q15 min safety checks for safety. Care, comfort and safety maintained/ongoing.

## 2024-03-24 DIAGNOSIS — F329 Major depressive disorder, single episode, unspecified: Secondary | ICD-10-CM | POA: Diagnosis not present

## 2024-03-24 MED ORDER — LORAZEPAM 0.5 MG PO TABS
0.5000 mg | ORAL_TABLET | Freq: Three times a day (TID) | ORAL | Status: DC
  Administered 2024-03-24 – 2024-03-25 (×3): 0.5 mg via ORAL
  Filled 2024-03-24 (×3): qty 1

## 2024-03-24 NOTE — Group Note (Signed)
 Date:  03/24/2024 Time:  2:13 PM  Group Topic/Focus:  Goals Group:   The focus of this group is to help patients establish daily goals to achieve during treatment and discuss how the patient can incorporate goal setting into their daily lives to aide in recovery.    Participation Level:  Did Not Attend   Jeff Stewart 03/24/2024, 2:13 PM

## 2024-03-24 NOTE — Progress Notes (Signed)
 Jeff Stewart

## 2024-03-24 NOTE — Progress Notes (Addendum)
 Behavior:  Pleasant and cooperative.    Psych assessment: Endorses anxiety and sleep disturbance.  Denies SI/HI and AVH.    Interaction / Group attendance:  Present in the milieu.  Appropriate interaction with peers and staff.  Medication/ PRNs: Compliant.  Ativan  (0.5 mg) now ordered TID.    Pain:  Denies.  15 min checks in place for safety.

## 2024-03-24 NOTE — Progress Notes (Signed)
 [facility] MD Progress Note  03/25/2024 9:58 AM Jeff Stewart  MRN:  [id number]   Subjective:  Chart reviewed, case discussed in multidisciplinary meeting, patient seen during rounds.   Patient is a 69 year old male who was admitted due to significant challenges with insomnia and internal significant functional impairment and suicidal thoughts  Past Psychiatric History: see h&P Family History:  Family History  Problem Relation Age of Onset   Stroke Mother 35   CAD Father 48   Emphysema Father    CAD Sister    Hypertension Sister    Hyperlipidemia Sister    Cancer Sister        melanoma   Cancer Sister    Lung cancer Sister    CAD Brother 40       CABG   Alcohol abuse Brother    Hypertension Brother    Stroke Brother    CAD Brother    Cirrhosis Brother    Alcohol abuse Brother    Hypertension Brother    Cancer Brother    Lung cancer Brother    Cancer Brother    Lung cancer Brother    Migraines Other    Colon cancer Neg Hx    Colon polyps Neg Hx    Esophageal cancer Neg Hx    Rectal cancer Neg Hx    Stomach cancer Neg Hx    Seizures Neg Hx    Social History:  Social History   Substance and Sexual Activity  Alcohol Use No     Social History   Substance and Sexual Activity  Drug Use No    Social History   Socioeconomic History   Marital status: Married    Spouse name: Not on file   Number of children: 3   Years of education: Not on file   Highest education level: GED or equivalent  Occupational History   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  Tobacco Use   Smoking status: Never   Smokeless tobacco: Never  Vaping Use   Vaping status: Never Used  Substance and Sexual Activity   Alcohol use: No   Drug use: No   Sexual activity: Not Currently    Birth control/protection: Post-menopausal    Comment: married for 1972  Other Topics Concern   Not on file  Social History Narrative   Lives with wife    Children live nearby   Retired    1-2  cup of coffee    Social Drivers of Health   Tobacco Use: Low Risk (03/22/2024)   Patient History    Smoking Tobacco Use: Never    Smokeless Tobacco Use: Never    Passive Exposure: Not on file  Financial Resource Strain: Low Risk (03/19/2024)   Overall Financial Resource Strain (CARDIA)    Difficulty of Paying Living Expenses: Not hard at all  Food Insecurity: No Food Insecurity (03/22/2024)   Epic    Worried About Radiation Protection Practitioner of Food in the Last Year: Never true    Ran Out of Food in the Last Year: Never true  Transportation Needs: No Transportation Needs (03/22/2024)   Epic    Lack of Transportation (Medical): No    Lack of Transportation (Non-Medical): No  Physical Activity: Insufficiently Active (03/19/2024)   Exercise Vital Sign    Days of Exercise per Week: 1 day    Minutes of Exercise per Session: 20 min  Stress: No Stress Concern Present (03/19/2024)   [employer] of Occupational Health - Occupational  Stress Questionnaire    Feeling of Stress: Only a little  Social Connections: Socially Integrated (03/22/2024)   Social Connection and Isolation Panel    Frequency of Communication with Friends and Family: Three times a week    Frequency of Social Gatherings with Friends and Family: Twice a week    Attends Religious Services: More than 4 times per year    Active Member of Clubs or Organizations: Yes    Attends Banker Meetings: 1 to 4 times per year    Marital Status: Married  Depression (PHQ2-9): Low Risk (03/19/2024)   Depression (PHQ2-9)    PHQ-2 Score: 4  Alcohol Screen: Low Risk (03/22/2024)   Alcohol Screen    Last Alcohol Screening Score (AUDIT): 0  Housing: Low Risk (03/22/2024)   Epic    Unable to Pay for Housing in the Last Year: No    Number of Times Moved in the Last Year: 0    Homeless in the Last Year: No  Utilities: Not At Risk (03/22/2024)   Epic    Threatened with loss of utilities: No  Health Literacy: Adequate Health Literacy (08/07/2023)    B1300 Health Literacy    Frequency of need for help with medical instructions: Never   Past Medical History:  Past Medical History:  Diagnosis Date   Anxiety    Carpal tunnel syndrome, bilateral    Cataract    removed years ago   Chronic back pain    internal morphine pump   DDD (degenerative disc disease)    neck, lumbar   Depression    Dyslipidemia    diet controlled   GERD (gastroesophageal reflux disease)    past hx- had nissen fundiplication    Hypertension    borderline   Sleep apnea    wears C-PAP   Status post insertion of spinal cord stimulator     Past Surgical History:  Procedure Laterality Date   BACK SURGERY     x 6   CARPAL TUNNEL RELEASE     bilateral   COLONOSCOPY     ELBOW SURGERY     INGUINAL HERNIA REPAIR Right    INTERNAL MORPHINE PUMP      KNEE ARTHROSCOPY     NASAL SEPTUM SURGERY     x 6   neck fusion   07/18/2023   neck surgery pe rpt   NISSEN FUNDOPLICATION     SKIN CANCER EXCISION     SPINAL CORD STIMULATOR INSERTION     SPINAL CORD STIMULATOR REMOVAL  02/2023   STOMACH SURGERY     Nissen Fundiplication   thumb surgery     TOTAL KNEE ARTHROPLASTY Left 04/24/2018   Procedure: TOTAL KNEE ARTHROPLASTY;  Surgeon: Beverley Evalene BIRCH, MD;  Location: [facility];  Service: Orthopedics;  Laterality: Left;   UPPER GASTROINTESTINAL ENDOSCOPY      Current Medications: Current Facility-Administered Medications  Medication Dose Route Frequency Provider Last Rate Last Admin   hydrochlorothiazide  (HYDRODIURIL ) tablet 25 mg  25 mg Oral Daily White, Patrice L, NP   25 mg at 03/25/24 9081   LORazepam  (ATIVAN ) tablet 0.5 mg  0.5 mg Oral TID Maisen Schmit R, MD   0.5 mg at 03/25/24 9081   LORazepam  (ATIVAN ) tablet 1 mg  1 mg Oral Q6H PRN Jagjit Riner R, MD   1 mg at 03/24/24 1039   losartan  (COZAAR ) tablet 50 mg  50 mg Oral BID White, Patrice L, NP   50 mg at 03/25/24 [phone]  OLANZapine  (ZYPREXA ) injection 5 mg  5 mg Intramuscular TID PRN White, Patrice  L, NP       OLANZapine  zydis (ZYPREXA ) disintegrating tablet 5 mg  5 mg Oral TID PRN White, Patrice L, NP       pantoprazole  (PROTONIX ) EC tablet 40 mg  40 mg Oral QAC breakfast Insiya Oshea R, MD   40 mg at 03/25/24 0729   QUEtiapine  (SEROQUEL ) tablet 50 mg  50 mg Oral QHS,MR X 1 Broden Holt R, MD   50 mg at 03/24/24 2254   sertraline  (ZOLOFT ) tablet 150 mg  150 mg Oral QHS White, Patrice L, NP   150 mg at 03/24/24 2122    Lab Results: No results found for this or any previous visit (from the past 48 hours).  Blood Alcohol level:  Lab Results  Component Value Date   [facility] <15 03/21/2024    Metabolic Disorder Labs: Lab Results  Component Value Date   HGBA1C 5.5 03/23/2024   MPG 111.15 03/23/2024   Lab Results  Component Value Date   PROLACTIN 6.1 04/03/2015   Lab Results  Component Value Date   CHOL 173 03/23/2024   TRIG 97 03/23/2024   HDL 44 03/23/2024   CHOLHDL 3.9 03/23/2024   VLDL 19 03/23/2024   LDLCALC 110 (H) 03/23/2024   LDLCALC 104 (H) 10/18/2023    Physical Findings: AIMS:  , ,  ,  ,    CIWA:    COWS:      Psychiatric Specialty Exam:  Presentation  Patient is a 69 year old male who appears stated age alert oriented x 3 mood is okay affect is constricted thought process is logical and coherent denies any suicidal thoughts Insight and judgment fair Musculoskeletal: Strength & Muscle Tone: within normal limits Gait & Station: normal Assets  Assets:No data recorded   Physical Exam: Physical Exam ROS Blood pressure 121/80, pulse 80, temperature 97.6 F (36.4 C), resp. rate 18, height 6' 1 (1.854 m), weight 107.3 kg, SpO2 99%. Body mass index is 31.2 kg/m.  Diagnosis: Principal Problem:   MDD (major depressive disorder), severe (HCC)   PLAN: Safety and Monitoring:  -- Voluntary admission to inpatient psychiatric unit for safety, stabilization and treatment  -- Daily contact with patient to assess and evaluate symptoms and progress in  treatment  -- Patient's case to be discussed in multi-disciplinary team meeting  -- Observation Level : q15 minute checks  -- Vital signs:  q12 hours  -- Precautions: suicide, elopement, and assault -- Encouraged patient to participate in unit milieu and in scheduled group therapies  2. Psychiatric Treatment:  Scheduled Medications:    Will continue with Seroquel  and current medications also start on Ativan  0.5 mg p.o. twice daily -- The risks/benefits/side-effects/alternatives to this medication were discussed in detail with the patient and time was given for questions. The patient consents to medication trial.  3. Medical Issues Being Addressed:     4. Discharge Planning:   -- Social work and case management to assist with discharge planning and identification of hospital follow-up needs prior to discharge  -- Estimated LOS: 3-4 days  Millie JONELLE Manners, MD 03/25/2024, 9:58 AM

## 2024-03-24 NOTE — BHH Counselor (Signed)
 Adult Comprehensive Assessment  Patient ID: Jeff Stewart, male   DOB: [birth year], 69 y.o.   MRN: [id number]  Information Source: Information source: Patient  Current Stressors:  Patient states their primary concerns and needs for treatment are:: I've been having these issues for over a year, I'll go a while and sleep okay and then I can't sleep Patient states their goals for this hospitilization and ongoing recovery are:: Pt reports he wants improvement on his sleep Educational / Learning stressors: None reported Employment / Job issues: None reported Family Relationships: Pt reports that his family relationships are great with his family. Financial / Lack of resources (include bankruptcy): None reported Housing / Lack of housing: None reported Physical health (include injuries & life threatening diseases): Pt reports chronic pain from back surgery, reports he feels the doctors blame his insomnia on his chronic pain Social relationships: Pt reports that he has some relationships with others concerning his job. Substance abuse: None reported Bereavement / Loss: Pt reports that he had a good that passed recently 3 months ago.  Living/Environment/Situation:  Living Arrangements: Spouse/significant other Living conditions (as described by patient or guardian): Pt states that his living arrangements are good. Who else lives in the home?: Pt reports he lives with his wife How long has patient lived in current situation?: 10 years What is atmosphere in current home: Loving, Supportive  Family History:  Marital status: Married Number of Years Married: 8 What types of issues is patient dealing with in the relationship?: Pt states that he has to apologize alot due to his chronic pain. Additional relationship information: None reported. Are you sexually active?: No What is your sexual orientation?: Pt states he prefers women. Has your sexual activity been affected by drugs, alcohol,  medication, or emotional stress?: Pt reports that his sexual activity has been affected from medication. Does patient have children?: Yes How many children?: 3 How is patient's relationship with their children?: Pt states his relationship with his children are great.  Childhood History:  By whom was/is the patient raised?: Both parents Additional childhood history information: Pt reports that it was 12 children in his family while growing up.  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 Description of patient's relationship with caregiver when they were a child: Pt states his relationship with his parents were great. Patient's description of current relationship with people who raised him/her: Pt states that his parents are dead.  Pt's father shot himself. How were you disciplined when you got in trouble as a child/adolescent?: Pt reports that his mother had to take a switch. Does patient have siblings?: Yes Number of Siblings: 4 Description of patient's current relationship with siblings: Pt reports that they have a great relationship. Did patient suffer any verbal/emotional/physical/sexual abuse as a child?: No Did patient suffer from severe childhood neglect?: No Has patient ever been sexually abused/assaulted/raped as an adolescent or adult?: No Was the patient ever a victim of a crime or a disaster?: No Has patient been affected by domestic violence as an adult?: No  Education:  Highest grade of school patient has completed: Pt reports that he finish high school 12th grade. 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 Has Patient ever Been in [employer]?: No  Financial Resources:   Surveyor, Quantity resources:  (Pt  receives Tree Surgeon) Does patient have a lawyer or guardian?: No  Alcohol/Substance Abuse:   What has been your use of drugs/alcohol within the last 12 months?: N/A If attempted suicide, did drugs/alcohol play a role in this?: No Alcohol/Substance Abuse Treatment Hx: Denies past history Is patient motivated for change?: Yes Does patient live in an environment that promotes recovery or serves as an obstacle to recovery?: Yes - promotes recovery Describe how the environment promotes recovery or serves as an obstacle to recovery: Pt reports that he has a supportive family, church member and additional family members. Are others in the home using alcohol or other substances?: No Are significant others in the home willing to participate in the patient's care?: Yes Describe significant others willing to participate in the patient's care: Pt reports his wife, children and sisters. Has alcohol/substance abuse ever caused legal problems?: No  Social Support System:   Patient's Community Support System: Good Describe Community Support System: my family Type of faith/religion: [employer] How does patient's faith help to cope with current illness?: Pt states that he could not do without his faith.  Leisure/Recreation:   Do You Have Hobbies?: Yes Leisure and Hobbies: Pt reports that he does wood working.  Strengths/Needs:   What is the patient's perception of their strengths?: Pt states that he is determine to get better.  Turn to his family more and God. Patient states they can use these personal strengths during their treatment to contribute to their recovery: Yes Patient states these barriers may affect/interfere with their treatment: N/A Patient states these barriers may affect their return to the community: No Other important information patient would like considered in planning for their treatment: Pt would like to receive assist with his sleeping.  Discharge Plan:    Currently receiving community mental health services: No Patient states concerns and preferences for aftercare planning are: Pt would like to see a therapist. Patient states they will know when they are safe and ready for discharge when: Pt states that he will be ready when the doctors find the correct medication which will allow him to sleep for the night. Does patient have access to transportation?: Yes Does patient have financial barriers related to discharge medications?: No Patient description of barriers related to discharge medications: N/A Will patient be returning to same living situation after discharge?: Yes  Summary/Recommendations:   Summary and Recommendations (to be completed by the evaluator): Patient is a 69 year old male with a history of chronic pain managed with an implanted morphine pump, obstructive sleep apnea on BiPAP, and long-standing depression treated with sertraline , who presents for psychiatric inpatient admission due to severe, chronic sleep disturbance with worsening depression and emergent suicidal ideation.The patient reports approximately one year of profound sleep dysregulation, characterized by cycles in which he remains awake for 72-96 hours, followed by periods of hypersomnia lasting up to 24 hours, then recurrent insomnia. These cycles have become increasingly frequent and debilitating over recent months. During prolonged insomnia, he reports pacing, inability to rest, worsening pain, emotional exhaustion, and increasing hopelessness.He denies a lifelong psychiatric history and reports that his first depressive symptoms emerged approximately 15 years ago, temporally associated with the placement of a morphine pump for chronic back pain. At that time, he was started on sertraline  (Zoloft ) 150 mg daily, which he has remained on without dose changes since initiation.  Approximately one year ago, following a back procedure, his sleep disturbance markedly worsened.  He  underwent a formal sleep evaluation and was diagnosed with obstructive sleep apnea. He is compliant with BiPAP, with documented improvement in nocturnal oxygen saturation; however, his insomnia has persisted despite adequate treatment of sleep apnea.the patient reports worsening depressive symptoms, including low mood, anhedonia, helplessness, and passive to active suicidal ideation, particularly over the past several days. He acknowledges making suicidal statements, prompting family concern and presentation for evaluation. He reports a significant family history of suicide, noting that his father died by firearm suicide in 67 in the context of late-life stressors and functional decline.He denies homicidal ideation, psychotic symptoms, manic symptoms, or substance misuse.  Recommendations for patient includes: crisis stabilization, therapeutic milieu, encourage group attendance and participation, medication management for mood stabilization, and development of comprehensive mental wellness. DELL Evalene CLEMENTEEN Ezra Rexene LELON Chesley. 03/24/2024

## 2024-03-24 NOTE — Group Note (Signed)
 Date:  03/24/2024 Time:  8:54 PM  Group Topic/Focus:  Wrap-Up Group:   The focus of this group is to help patients review their daily goal of treatment and discuss progress on daily workbooks.    Participation Level:  Active  Participation Quality:  Appropriate  Affect:  Appropriate  Cognitive:  Alert  Insight: Appropriate  Engagement in Group:  Engaged  Modes of Intervention:  Discussion  Additional Comments:    Jeff Stewart 03/24/2024, 8:54 PM

## 2024-03-24 NOTE — Plan of Care (Signed)
  Problem: Education: Goal: Utilization of techniques to improve thought processes will improve Outcome: Progressing Goal: Knowledge of the prescribed therapeutic regimen will improve Outcome: Progressing   Problem: Activity: Goal: Interest or engagement in leisure activities will improve Outcome: Progressing Goal: Imbalance in normal sleep/wake cycle will improve Outcome: Progressing   

## 2024-03-24 NOTE — Group Note (Signed)
 [facility] LCSW Group Therapy Note   Group Date: 03/24/2024 Start Time: 1030 End Time: 1115   Type of Therapy/Topic:  Group Therapy:  Emotion Regulation  Participation Level:  Active   Mood: pleasant, calm  Description of Group:    The purpose of this group is to assist patients in learning to regulate negative emotions and experience positive emotions. Patients will be guided to discuss ways in which they have been vulnerable to their negative emotions. These vulnerabilities will be juxtaposed with experiences of positive emotions or situations, and patients challenged to use positive emotions to combat negative ones. Special emphasis will be placed on coping with negative emotions in conflict situations, and patients will process healthy conflict resolution skills.  Therapeutic Goals: Patient will identify two positive emotions or experiences to reflect on in order to balance out negative emotions:  Patient will label two or more emotions that they find the most difficult to experience:  Patient will be able to demonstrate positive conflict resolution skills through discussion or role plays:   Summary of Patient Progress: The facilitator and patient discussed how to regulate negative emotions and experience positive emotions. Group members discussed their positive emotions and strategies for negative emotions.The facilitator and patient examined how different  emotions can influence their mood.  The patient reflected on how their emotions can shape their current thoughts.  The patient was receptive to feedback from both peers and the facilitator and contributed to creating a supportive environment, encouraging others to open up and share.   Therapeutic Modalities:   Cognitive Behavioral Therapy Feelings Identification Dialectical Behavioral Therapy   Jeff Stewart, LCSWA

## 2024-03-24 NOTE — Group Note (Signed)
 Date:  03/24/2024 Time:  3:42 PM  Group Topic/Focus:  Wellness Toolbox:   The focus of this group is to discuss various aspects of wellness, balancing those aspects and exploring ways to increase the ability to experience wellness.  Patients will create a wellness toolbox for use upon discharge.    Participation Level:  Active  Participation Quality:  Appropriate and Attentive  Affect:  Appropriate  Cognitive:  Alert  Insight: Appropriate  Engagement in Group:  Limited  Modes of Intervention:  Discussion  Additional Comments:     Maglione,Vinicius Brockman E 03/24/2024, 3:42 PM

## 2024-03-24 NOTE — Plan of Care (Signed)
" °  Problem: Activity: Goal: Interest or engagement in activities will improve Outcome: Progressing   Problem: Health Behavior/Discharge Planning: Goal: Compliance with treatment plan for underlying cause of condition will improve Outcome: Progressing   Problem: Education: Goal: Knowledge of the prescribed therapeutic regimen will improve Outcome: Progressing   "

## 2024-03-24 NOTE — Progress Notes (Signed)
 SI/HI: denies Behavior/Mood: calm and cooperative. Speech clear and good eye contact. Endorses anxiety related to inability to sleep. Reports medication helps briefly, then symptoms return. Expresses concerns about medication regimen. Support and reassurance provided. Interaction/Group:pt had a visit with his son and reports the visit went well. Did not attend group. Medications/PRNs: po med compliant. PRN given for insomnia. Pain: denies Other: CPAP in place. Slept 8.75 hours   03/23/24 2100  Psych Admission Type (Psych Patients Only)  Admission Status Voluntary  Psychosocial Assessment  Patient Complaints Anxiety  Eye Contact Fair  Facial Expression Other (Comment) (WNL)  Affect Anxious  Speech Logical/coherent  Interaction Assertive  Motor Activity Slow  Appearance/Hygiene In scrubs  Behavior Characteristics Cooperative  Mood Anxious  Thought Process  Coherency WDL  Content WDL  Delusions None reported or observed  Perception WDL  Hallucination None reported or observed  Judgment Impaired  Confusion None  Danger to Self  Current suicidal ideation? Denies

## 2024-03-25 DIAGNOSIS — F332 Major depressive disorder, recurrent severe without psychotic features: Principal | ICD-10-CM

## 2024-03-25 MED ORDER — QUETIAPINE FUMARATE 50 MG PO TABS: 50.0000 mg | ORAL_TABLET | Freq: Every evening | ORAL | 0 refills | Status: DC | PRN

## 2024-03-25 MED ORDER — LORAZEPAM 0.5 MG PO TABS: 0.5000 mg | ORAL_TABLET | Freq: Three times a day (TID) | ORAL | 0 refills | Status: AC

## 2024-03-25 MED ORDER — SERTRALINE HCL 50 MG PO TABS: 150.0000 mg | ORAL_TABLET | Freq: Every day | ORAL | 0 refills | Status: AC

## 2024-03-25 MED ORDER — LORAZEPAM 0.5 MG PO TABS: 0.5000 mg | ORAL_TABLET | Freq: Three times a day (TID) | ORAL | 0 refills | Status: DC

## 2024-03-25 NOTE — Progress Notes (Signed)
 Patient was discharged from [facility] unit escorted by staff. Patient denies SI/HI/AVH. Discharge packet to include printed AVS, Suicide Risk Assessment, printed Ativan  Rx, and Transition Record reviewed with patient. Belongings to include CPAP and dentures returned. Also see belongings sheet. Patient completed a Suicide Safety Plan and patient survey.

## 2024-03-25 NOTE — BHH Counselor (Signed)
 CSW faxed referral to [facility]   CSW contacted [facility] to confirm they received referral.   [facility] reports they cannot accept referral due to pt not having a PCP in [facility] system   CSW will inform pt.   Lum Croft, MSW, [place] 03/25/2024 10:06 AM

## 2024-03-25 NOTE — Discharge Summary (Signed)
 " Physician Discharge Summary Note  Patient:  Jeff Stewart is an 69 y.o., male MRN:  [id number] DOB:  [birth year] Patient phone:  [phone] (home)  Patient address:   [address] [place] [address],   Total time spent: 40 min Date of Admission:  03/22/2024 Date of Discharge: 03/25/24  Reason for Admission:  Jeff Stewart is a 69 year old male with a history of chronic pain managed with an implanted morphine pump, obstructive sleep apnea on BiPAP, and long-standing depression treated with sertraline , who presents for psychiatric inpatient admission due to severe, chronic sleep disturbance with worsening depression and emergent suicidal ideation.   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  Past Psychiatric History: see h&p  Family Psychiatric  History: see h&p Social History:  Social History   Substance and Sexual Activity  Alcohol Use No     Social History   Substance and Sexual Activity  Drug Use No    Social History   Socioeconomic History   Marital status: Married    Spouse name: Not on file   Number of children: 3   Years of education: Not on file   Highest education level: GED or equivalent  Occupational History   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  Tobacco Use   Smoking status: Never   Smokeless tobacco: Never  Vaping Use   Vaping status: Never Used  Substance and Sexual Activity   Alcohol use: No   Drug use: No   Sexual activity: Not Currently    Birth control/protection: Post-menopausal    Comment: married for 1972  Other Topics Concern   Not on file  Social History Narrative   Lives with wife    Children live nearby   Retired    1-2 cup of coffee    Social Drivers of Health   Tobacco Use: Low Risk (03/22/2024)   Patient History    Smoking Tobacco Use: Never    Smokeless  Tobacco Use: Never    Passive Exposure: Not on file  Financial Resource Strain: Low Risk (03/19/2024)   Overall Financial Resource Strain (CARDIA)    Difficulty of Paying Living Expenses: Not hard at all  Food Insecurity: No Food Insecurity (03/22/2024)   Epic    Worried About Radiation Protection Practitioner of Food in the Last Year: Never true    Ran Out of Food in the Last Year: Never true  Transportation Needs: No Transportation Needs (03/22/2024)   Epic    Lack of Transportation (Medical): No    Lack of Transportation (Non-Medical): No  Physical Activity: Insufficiently Active (03/19/2024)   Exercise Vital Sign    Days of Exercise per Week: 1 day    Minutes of Exercise per Session: 20 min  Stress: No Stress Concern Present (03/19/2024)   [employer] of Occupational Health - Occupational Stress Questionnaire    Feeling of Stress: Only a little  Social Connections: Socially Integrated (03/22/2024)   Social Connection and Isolation Panel    Frequency of Communication with Friends and Family: Three times a week    Frequency of Social Gatherings with Friends and Family: Twice a week    Attends Religious Services: More than 4 times per year    Active Member of [employer] or Organizations: Yes    Attends Banker Meetings: 1 to 4 times per year    Marital Status:  Married  Depression (PHQ2-9): Low Risk (03/19/2024)   Depression (PHQ2-9)    PHQ-2 Score: 4  Alcohol Screen: Low Risk (03/22/2024)   Alcohol Screen    Last Alcohol Screening Score (AUDIT): 0  Housing: Low Risk (03/22/2024)   Epic    Unable to Pay for Housing in the Last Year: No    Number of Times Moved in the Last Year: 0    Homeless in the Last Year: No  Utilities: Not At Risk (03/22/2024)   Epic    Threatened with loss of utilities: No  Health Literacy: Adequate Health Literacy (08/07/2023)   B1300 Health Literacy    Frequency of need for help with medical instructions: Never   Past Medical History:  Past Medical History:   Diagnosis Date   Anxiety    Carpal tunnel syndrome, bilateral    Cataract    removed years ago   Chronic back pain    internal morphine pump   DDD (degenerative disc disease)    neck, lumbar   Depression    Dyslipidemia    diet controlled   GERD (gastroesophageal reflux disease)    past hx- had nissen fundiplication    Hypertension    borderline   Sleep apnea    wears C-PAP   Status post insertion of spinal cord stimulator     Past Surgical History:  Procedure Laterality Date   BACK SURGERY     x 6   CARPAL TUNNEL RELEASE     bilateral   COLONOSCOPY     ELBOW SURGERY     INGUINAL HERNIA REPAIR Right    INTERNAL MORPHINE PUMP      KNEE ARTHROSCOPY     NASAL SEPTUM SURGERY     x 6   neck fusion   07/18/2023   neck surgery pe rpt   NISSEN FUNDOPLICATION     SKIN CANCER EXCISION     SPINAL CORD STIMULATOR INSERTION     SPINAL CORD STIMULATOR REMOVAL  02/2023   STOMACH SURGERY     Nissen Fundiplication   thumb surgery     TOTAL KNEE ARTHROPLASTY Left 04/24/2018   Procedure: TOTAL KNEE ARTHROPLASTY;  Surgeon: Beverley Evalene BIRCH, MD;  Location: [facility];  Service: Orthopedics;  Laterality: Left;   UPPER GASTROINTESTINAL ENDOSCOPY     Family History:  Family History  Problem Relation Age of Onset   Stroke Mother 3   CAD Father 64   Emphysema Father    CAD Sister    Hypertension Sister    Hyperlipidemia Sister    Cancer Sister        melanoma   Cancer Sister    Lung cancer Sister    CAD Brother 58       CABG   Alcohol abuse Brother    Hypertension Brother    Stroke Brother    CAD Brother    Cirrhosis Brother    Alcohol abuse Brother    Hypertension Brother    Cancer Brother    Lung cancer Brother    Cancer Brother    Lung cancer Brother    Migraines Other    Colon cancer Neg Hx    Colon polyps Neg Hx    Esophageal cancer Neg Hx    Rectal cancer Neg Hx    Stomach cancer Neg Hx    Seizures Neg Hx     Hospital Course:  Jeff Stewart is a  69 year old male with a history of chronic pain managed with  an implanted morphine pump, obstructive sleep apnea on BiPAP, and long-standing depression treated with sertraline , who presents for psychiatric inpatient admission due to severe, chronic sleep disturbance with worsening depression and emergent suicidal ideation. Patient is admitted to [facility] unit with Q15 min safety monitoring. Multidisciplinary team approach is offered. Medication management; group/milieu therapy is offered.   On admission, patient was started and managed on Ativan  0.5 mg 3 times daily to help with the anxiety, Zoloft  150 mg nightly, Seroquel  50 mg nightly to help with the insomnia that has been the main trigger for depression and suicidal ideation.  Patient had good night sleep consistently throughout his hospitalization and consistently maintains safe behaviors.  Patient reports significant improvement in his mood.  Treatment plan and discharge planning was discussed with patient's wife and son..  Detailed risk assessment is complete based on clinical exam and individual risk factors and acute suicide risk is low and acute violence risk is low.    On the day of discharge, patient denies SI/HI/plan and denies hallucinations.  Patient remains future oriented and is willing to participate in outpatient mental health services.  Currently, all modifiable risk of harm to self/harm to others have been addressed and patient is no longer appropriate for the acute inpatient setting and is able to continue treatment for mental health needs in the community with the supports as indicated below.  Patient is educated and verbalized understanding of discharge plan of care including medications, follow-up appointments, mental health resources and further crisis services in the community.  He is instructed to call 911 or present to the nearest emergency room should he experience any decompensation in mood, disturbance of bowel or return of  suicidal/homicidal ideations.  Patient verbalizes understanding of this education and agrees to this plan of care  Physical Findings: AIMS:  , ,  ,  ,    CIWA:    COWS:      Psychiatric Specialty Exam:  Presentation  General Appearance:  Appropriate for Environment  Eye Contact: Fair  Speech: Clear and Coherent  Speech Volume: Normal    Mood and Affect  Mood: Euthymic  Affect: Appropriate   Thought Process  Thought Processes: Coherent  Descriptions of Associations:Intact  Orientation:Full (Time, Place and Person)  Thought Content:Logical  Hallucinations:Hallucinations: None  Ideas of Reference:None  Suicidal Thoughts:Suicidal Thoughts: No  Homicidal Thoughts:Homicidal Thoughts: No   Sensorium  Memory: Immediate Fair; Recent Fair  Judgment: Fair  Insight: Fair   Art Therapist  Concentration: Fair  Attention Span: Fair  Recall: [employer] of Knowledge: Fair  Language: Fair   Psychomotor Activity  Psychomotor Activity: Psychomotor Activity: Normal  Musculoskeletal: Strength & Muscle Tone: within normal limits Gait & Station: normal Assets  Assets: Manufacturing Systems Engineer; Desire for Improvement   Sleep  Sleep: Sleep: Fair    Physical Exam: Physical Exam ROS Blood pressure 121/80, pulse 80, temperature 97.6 F (36.4 C), resp. rate 18, height 6' 1 (1.854 m), weight 107.3 kg, SpO2 99%. Body mass index is 31.2 kg/m.   Tobacco Use History[1] Tobacco Cessation:  N/A, patient does not currently use tobacco products   Blood Alcohol level:  Lab Results  Component Value Date   [facility] <15 03/21/2024    Metabolic Disorder Labs:  Lab Results  Component Value Date   HGBA1C 5.5 03/23/2024   MPG 111.15 03/23/2024   Lab Results  Component Value Date   PROLACTIN 6.1 04/03/2015   Lab Results  Component Value Date   CHOL 173 03/23/2024  TRIG 97 03/23/2024   HDL 44 03/23/2024   CHOLHDL 3.9 03/23/2024   VLDL  19 03/23/2024   LDLCALC 110 (H) 03/23/2024   LDLCALC 104 (H) 10/18/2023    See Psychiatric Specialty Exam and Suicide Risk Assessment completed by Attending Physician prior to discharge.  Discharge destination:  Home  Is patient on multiple antipsychotic therapies at discharge:  No   Has Patient had three or more failed trials of antipsychotic monotherapy by history:  No  Recommended Plan for Multiple Antipsychotic Therapies: NA   Allergies as of 03/25/2024       Reactions   Nalbuphine Nausea And Vomiting, Rash, Shortness Of Breath   nalbuphine   Nubain [nalbuphine Hcl] Rash        Medication List     STOP taking these medications    celecoxib  200 MG capsule Commonly known as: CeleBREX    eszopiclone  2 MG Tabs tablet Commonly known as: LUNESTA    furosemide  40 MG tablet Commonly known as: LASIX    hydrOXYzine  25 MG capsule Commonly known as: VISTARIL    NON FORMULARY       TAKE these medications      Indication  acetaminophen  500 MG tablet Commonly known as: TYLENOL  Take 500 mg by mouth every 6 (six) hours as needed.    fluticasone  50 MCG/ACT nasal spray Commonly known as: FLONASE  Place 2 sprays into both nostrils daily.    hydrochlorothiazide  25 MG tablet Commonly known as: HYDRODIURIL  Take 1 tablet (25 mg total) by mouth daily. TAKE 1 TABLET DAILY.    LORazepam  0.5 MG tablet Commonly known as: ATIVAN  Take 1 tablet (0.5 mg total) by mouth 3 (three) times daily.    losartan  50 MG tablet Commonly known as: COZAAR  Take 1 tablet (50 mg total) by mouth 2 (two) times daily.    ondansetron  4 MG disintegrating tablet Commonly known as: ZOFRAN -ODT Take 1 tablet (4 mg total) by mouth every 8 (eight) hours as needed for nausea or vomiting.    pantoprazole  40 MG tablet Commonly known as: PROTONIX  Take 1 tablet (40 mg total) by mouth daily.    QUEtiapine  50 MG tablet Commonly known as: SEROQUEL  Take 1 tablet (50 mg total) by mouth at bedtime and may  repeat dose one time if needed.    sertraline  50 MG tablet Commonly known as: ZOLOFT  Take 3 tablets (150 mg total) by mouth at bedtime. What changed:  medication strength when to take this    SYRINGE 3CC/21GX1-1/4 21G X 1-1/4 3 ML Misc 1 each by Does not apply route once a week.    testosterone  cypionate 200 MG/ML injection Commonly known as: DEPOTESTOSTERONE CYPIONATE INJECT 0.5 MLS EVERY 10 DAYS    Vitamin D  50 MCG (2000 UT) Caps Take 2,000 Units by mouth daily with lunch.         Follow-up Information     [facility] [place] [facility] Follow up on 03/27/2024.   Why: Your appoitnment for therapy is scheduled with Wanda Kellyman at 3:00 PM on 03/27/2024. Please arrive 30 minutes early to your appointment to account for new patient paperwork that needs to be filled out. Contact information: [address] [place] [address] [phone]         [facility] Follow up on 05/27/2024.   Why: You appointment for psychiatry is scheduled with Dr. Homer at 1:30 PM on 05/27/24. Please arrive 30 minutes early to your appointment to account for new patient paperwork that needs to  be filled out. Contact information: [address] [place] [address] [phone]         [facility] Follow up on 04/19/2024.   Why: Your appointment for psychiatry is scheduled with Dr.Musa 04/19/2024 at 11:00 AM. Please fill out the paperwork sent to your email prior to your appointment. Contact information: [address] [place] [address] [phone]                 Follow-up recommendations:  Activity:  as tolerated    Signed: Chiamaka Latka, MD 03/25/2024, 12:14 PM          [1]  Social History Tobacco Use  Smoking Status Never  Smokeless Tobacco Never   "

## 2024-03-25 NOTE — Progress Notes (Signed)
" °  [facility] Adult Case Management Discharge Plan :  Will you be returning to the same living situation after discharge:  Yes,  pt will return home  At discharge, do you have transportation home?: Yes,  pt's wife will pick him up  Do you have the ability to pay for your medications: Yes,  [employer] MEDICARE / [facility] MEDICARE  Release of information consent forms completed and in the chart;  Patient's signature needed at discharge.  Patient to Follow up at:  Follow-up Information     [facility] [place] [facility] Follow up on 03/27/2024.   Why: Your appoitnment for therapy is scheduled with Wanda Kellyman at 3:00 PM on 03/27/2024. Please arrive 30 minutes early to your appointment to account for new patient paperwork that needs to be filled out. Contact information: [address] [place] [address] [phone]         [facility] Follow up on 05/27/2024.   Why: You appointment for psychiatry is scheduled with Dr. Homer at 1:30 PM on 05/27/24. Please arrive 30 minutes early to your appointment to account for new patient paperwork that needs to be filled out. Contact information: [address] [place] [address] [phone]         [facility] Follow up on 04/19/2024.   Why: Your appointment for psychiatry is scheduled with Dr.Musa 04/19/2024 at 11:00 AM. Please fill out the paperwork sent to your email prior to your appointment. Contact information: [address] [place] [address] [phone]                 Next level of care provider has access to [facility] Link:no  Safety Planning and Suicide Prevention discussed: Yes,  SPE gone over with pt      Has patient been referred to the Quitline?: Patient does not use tobacco/nicotine products  Patient has been referred for addiction treatment: No known substance use disorder.  [address], LCSWA 03/25/2024, 10:22 AM "

## 2024-03-25 NOTE — BH IP Treatment Plan (Signed)
 Interdisciplinary Treatment and Diagnostic Plan Update  03/25/2024 Time of Session: 11:19 AM  DERRICK TIEGS MRN: [id number]  Principal Diagnosis: MDD (major depressive disorder), severe (HCC)  Secondary Diagnoses: Principal Problem:   MDD (major depressive disorder), severe (HCC)   Current Medications:  Current Facility-Administered Medications  Medication Dose Route Frequency Provider Last Rate Last Admin   hydrochlorothiazide  (HYDRODIURIL ) tablet 25 mg  25 mg Oral Daily White, Patrice L, NP   25 mg at 03/25/24 9081   LORazepam  (ATIVAN ) tablet 0.5 mg  0.5 mg Oral TID Madaram, Kondal R, MD   0.5 mg at 03/25/24 9081   LORazepam  (ATIVAN ) tablet 1 mg  1 mg Oral Q6H PRN Madaram, Kondal R, MD   1 mg at 03/24/24 1039   losartan  (COZAAR ) tablet 50 mg  50 mg Oral BID White, Patrice L, NP   50 mg at 03/25/24 9081   OLANZapine  (ZYPREXA ) injection 5 mg  5 mg Intramuscular TID PRN White, Patrice L, NP       OLANZapine  zydis (ZYPREXA ) disintegrating tablet 5 mg  5 mg Oral TID PRN White, Patrice L, NP       pantoprazole  (PROTONIX ) EC tablet 40 mg  40 mg Oral QAC breakfast Madaram, Kondal R, MD   40 mg at 03/25/24 9270   QUEtiapine  (SEROQUEL ) tablet 50 mg  50 mg Oral QHS,MR X 1 Madaram, Kondal R, MD   50 mg at 03/24/24 2254   sertraline  (ZOLOFT ) tablet 150 mg  150 mg Oral QHS White, Patrice L, NP   150 mg at 03/24/24 2122   PTA Medications: Medications Prior to Admission  Medication Sig Dispense Refill Last Dose/Taking   acetaminophen  (TYLENOL ) 500 MG tablet Take 500 mg by mouth every 6 (six) hours as needed.      celecoxib  (CELEBREX ) 200 MG capsule Take 1 capsule (200 mg total) by mouth daily. (Patient not taking: Reported on 03/19/2024) 10 capsule 0    Cholecalciferol (VITAMIN D ) 50 MCG (2000 UT) CAPS Take 2,000 Units by mouth daily with lunch.      eszopiclone  (LUNESTA ) 2 MG TABS tablet Take 1 tablet (2 mg total) by mouth at bedtime as needed for sleep. Take immediately before bedtime and do not  use more than two nights per week (Patient not taking: Reported on 03/19/2024) 10 tablet 0    fluticasone  (FLONASE ) 50 MCG/ACT nasal spray Place 2 sprays into both nostrils daily. 16 g 11    furosemide  (LASIX ) 40 MG tablet Take 1 tablet (40 mg total) by mouth every morning. (Patient not taking: Reported on 03/19/2024) 90 tablet 3    hydrochlorothiazide  (HYDRODIURIL ) 25 MG tablet Take 1 tablet (25 mg total) by mouth daily. TAKE 1 TABLET DAILY. 90 tablet 3    hydrOXYzine  (VISTARIL ) 25 MG capsule Take 1 capsule (25 mg total) by mouth at bedtime as needed (sleep, may repeat in one hour). 10 capsule 0    losartan  (COZAAR ) 50 MG tablet Take 1 tablet (50 mg total) by mouth 2 (two) times daily. 180 tablet 3    NON FORMULARY Morphine pain pump      ondansetron  (ZOFRAN -ODT) 4 MG disintegrating tablet Take 1 tablet (4 mg total) by mouth every 8 (eight) hours as needed for nausea or vomiting. 15 tablet 0    pantoprazole  (PROTONIX ) 40 MG tablet Take 1 tablet (40 mg total) by mouth daily. 90 tablet 3    sertraline  (ZOLOFT ) 100 MG tablet Take 1.5 tablets (150 mg total) by mouth daily. 135 tablet 3  Syringe/Needle, Disp, (SYRINGE 3CC/21GX1-1/4) 21G X 1-1/4 3 ML MISC 1 each by Does not apply route once a week. 100 each 0    testosterone  cypionate (DEPOTESTOSTERONE CYPIONATE) 200 MG/ML injection INJECT 0.5 MLS EVERY 10 DAYS 10 mL 0     Patient Stressors: Health problems    Patient Strengths: Ability for insight  Capable of independent living  Forensic Psychologist fund of knowledge  Motivation for treatment/growth  Religious Affiliation  Supportive family/friends   Treatment Modalities: Medication Management, Group therapy, Case management,  1 to 1 session with clinician, Psychoeducation, Recreational therapy.   Physician Treatment Plan for Primary Diagnosis: MDD (major depressive disorder), severe (HCC) Long Term Goal(s):     Short Term Goals:    Medication Management: Evaluate patient's  response, side effects, and tolerance of medication regimen.  Therapeutic Interventions: 1 to 1 sessions, Unit Group sessions and Medication administration.  Evaluation of Outcomes: Adequate for Discharge  Physician Treatment Plan for Secondary Diagnosis: Principal Problem:   MDD (major depressive disorder), severe (HCC)  Long Term Goal(s):     Short Term Goals:       Medication Management: Evaluate patient's response, side effects, and tolerance of medication regimen.  Therapeutic Interventions: 1 to 1 sessions, Unit Group sessions and Medication administration.  Evaluation of Outcomes: Adequate for Discharge   RN Treatment Plan for Primary Diagnosis: MDD (major depressive disorder), severe (HCC) Long Term Goal(s): Knowledge of disease and therapeutic regimen to maintain health will improve  Short Term Goals: Ability to remain free from injury will improve, Ability to verbalize frustration and anger appropriately will improve, Ability to demonstrate self-control, Ability to participate in decision making will improve, Ability to verbalize feelings will improve, Ability to disclose and discuss suicidal ideas, Ability to identify and develop effective coping behaviors will improve, and Compliance with prescribed medications will improve  Medication Management: RN will administer medications as ordered by provider, will assess and evaluate patient's response and provide education to patient for prescribed medication. RN will report any adverse and/or side effects to prescribing provider.  Therapeutic Interventions: 1 on 1 counseling sessions, Psychoeducation, Medication administration, Evaluate responses to treatment, Monitor vital signs and CBGs as ordered, Perform/monitor CIWA, COWS, AIMS and Fall Risk screenings as ordered, Perform wound care treatments as ordered.  Evaluation of Outcomes: Adequate for Discharge   LCSW Treatment Plan for Primary Diagnosis: MDD (major depressive  disorder), severe (HCC) Long Term Goal(s): Safe transition to appropriate next level of care at discharge, Engage patient in therapeutic group addressing interpersonal concerns.  Short Term Goals: Engage patient in aftercare planning with referrals and resources, Increase social support, Increase ability to appropriately verbalize feelings, Increase emotional regulation, Facilitate acceptance of mental health diagnosis and concerns, Facilitate patient progression through stages of change regarding substance use diagnoses and concerns, Identify triggers associated with mental health/substance abuse issues, and Increase skills for wellness and recovery  Therapeutic Interventions: Assess for all discharge needs, 1 to 1 time with Social worker, Explore available resources and support systems, Assess for adequacy in community support network, Educate family and significant other(s) on suicide prevention, Complete Psychosocial Assessment, Interpersonal group therapy.  Evaluation of Outcomes: Adequate for Discharge   Progress in Treatment: Attending groups: Yes. and No. Participating in groups: Yes. and No. Taking medication as prescribed: Yes. Toleration medication: Yes. Family/Significant other contact made: Yes, individual(s) contacted:  CSW contacted pt's wife Ardeen) and son Blaise)  Patient understands diagnosis: Yes. Discussing patient identified problems/goals with staff: Yes. Medical problems stabilized  or resolved: Yes. Denies suicidal/homicidal ideation: Yes. Issues/concerns per patient self-inventory: No. Other: None   New problem(s) identified: No, Describe:  None identified   New Short Term/Long Term Goal(s):  elimination of symptoms of psychosis, medication management for mood stabilization; elimination of SI thoughts; development of comprehensive mental wellness plan.   Patient Goals:  To get the right kind of treatment I've been needing   Discharge Plan or Barriers: CSW will  assist with appropriate discharge planning   Reason for Continuation of Hospitalization: Depression Suicidal ideation  Estimated Length of Stay: 1 to 7 days   Last 3 [place] Suicide Severity Risk Score: Flowsheet Row Admission (Current) from 03/22/2024 in [facility] ED from 03/21/2024 in [facility] Department at [facility] from 03/02/2024 in [facility] at [facility] RISK CATEGORY Low Risk High Risk No Risk    Last PHQ 2/9 Scores:    03/19/2024    3:05 PM 11/08/2023    8:58 AM 10/18/2023    9:33 AM  Depression screen PHQ 2/9  Decreased Interest 0 3 2  Down, Depressed, Hopeless 0 1 1  PHQ - 2 Score 0 4 3  Altered sleeping 0 3 2  Tired, decreased energy 2 3 3   Change in appetite 0 3 1  Feeling bad or failure about yourself  0 0 0  Trouble concentrating 2 2 1   Moving slowly or fidgety/restless 0 1 1  Suicidal thoughts 0 0 0  PHQ-9 Score 4 16  11    Difficult doing work/chores Not difficult at all Very difficult Not difficult at all     Data saved with a previous flowsheet row definition    Scribe for Treatment Team: Lum JONETTA Croft, LCSWA 03/25/2024 2:00 PM

## 2024-03-25 NOTE — BHH Suicide Risk Assessment (Signed)
 [facility] Discharge Suicide Risk Assessment   Principal Problem: MDD (major depressive disorder), severe (HCC) Discharge Diagnoses: Principal Problem:   MDD (major depressive disorder), severe (HCC)   Total Time spent with patient: 30 minutes  Musculoskeletal: Strength & Muscle Tone: within normal limits Gait & Station: normal Patient leans: N/A  Psychiatric Specialty Exam  Presentation  General Appearance:  Appropriate for Environment  Eye Contact: Fair  Speech: Clear and Coherent  Speech Volume: Normal  Handedness: Right   Mood and Affect  Mood: Euthymic  Duration of Depression Symptoms: No data recorded Affect: Appropriate   Thought Process  Thought Processes: Coherent  Descriptions of Associations:Intact  Orientation:Full (Time, Place and Person)  Thought Content:Logical  History of Schizophrenia/Schizoaffective disorder:No data recorded Duration of Psychotic Symptoms:No data recorded Hallucinations:Hallucinations: None  Ideas of Reference:None  Suicidal Thoughts:Suicidal Thoughts: No  Homicidal Thoughts:Homicidal Thoughts: No   Sensorium  Memory: Immediate Fair; Recent Fair  Judgment: Fair  Insight: Fair   Art Therapist  Concentration: Fair  Attention Span: Fair  Recall: [employer] of Knowledge: Fair  Language: Fair   Psychomotor Activity  Psychomotor Activity: Psychomotor Activity: Normal   Assets  Assets: Communication Skills; Desire for Improvement   Sleep  Sleep: Sleep: Fair  Estimated Sleeping Duration (Last 24 Hours): 9.25-11.25 hours  Physical Exam: Physical Exam ROS Blood pressure 121/80, pulse 80, temperature 97.6 F (36.4 C), resp. rate 18, height 6' 1 (1.854 m), weight 107.3 kg, SpO2 99%. Body mass index is 31.2 kg/m.  Mental Status Per Nursing Assessment::   On Admission:  NA  Demographic Factors:  Male  Loss Factors: Decrease in vocational status  Historical  Factors: Impulsivity  Risk Reduction Factors:   Living with another person, especially a relative, Positive social support, Positive therapeutic relationship, and Positive coping skills or problem solving skills  Continued Clinical Symptoms:  Depression:   Insomnia  Cognitive Features That Contribute To Risk:  None    Suicide Risk:  Minimal: No identifiable suicidal ideation.  Patients presenting with no risk factors but with morbid ruminations; may be classified as minimal risk based on the severity of the depressive symptoms   Follow-up Information     [facility] Follow up on 03/27/2024.   Why: Your appoitnment for therapy is scheduled with Wanda Kellyman at 3:00 PM on 03/27/2024. Please arrive 30 minutes early to your appointment to account for new patient paperwork that needs to be filled out. Contact information: [address] [place] [address] [phone]         [facility] Follow up on 05/27/2024.   Why: You appointment for psychiatry is scheduled with Dr. Homer at 1:30 PM on 05/27/24. Please arrive 30 minutes early to your appointment to account for new patient paperwork that needs to be filled out. Contact information: [address] [place] [address] [phone]         [facility] Follow up on 04/19/2024.   Why: Your appointment for psychiatry is scheduled with Dr.Musa 04/19/2024 at 11:00 AM. Please fill out the paperwork sent to your email prior to your appointment. Contact information: [address] [place] [address] [phone]                 Plan Of Care/Follow-up recommendations:  Activity:  as tolerated  Allyn Foil, MD 03/25/2024, 12:13 PM

## 2024-03-25 NOTE — Group Note (Signed)
 Physical/Occupational Therapy Group Note  Group Topic: Functional, Dynamic Balance   Group Date: 03/25/2024 Start Time: 1300 End Time: 1330 Facilitators: Kilian Schwartz, Alm Hamilton, PT   Group Description: Group discussed impact of balance on safety and independence with functional tasks.  Identified and discussed any self-perceived balance deficits to personalize information.  Discussed and reviewed strategies to address/improve balance deficits: use of assist devices, activity pacing/energy conservation, environment/home safety modifications, focusing attention/minimizing distraction.  Reviewed and participated with standing LE therex designed to target dynamic balance reactions and LE strength/stability; provided handouts with HEP to be utilized outside of group time as appropriate.  Allowed time for questions and further discussion on any balance or mobility concerns/needs.  Therapeutic Goal(s):  Identify and discuss any individual balance deficits and functional implications. Identify and discuss any environmental/home safety modifications that can optimize balance and safety for mobility within the home. Demonstrate understanding and performance of standing therex designed to target dynamic balance deficits.  Individual Participation:  Pt actively participated with the discussion and physical activity components of the session.  Pt was steady with standing activities throughout the session.  Participation Level: Active and Engaged   Participation Quality: Minimal Cues   Behavior: Appropriate   Speech/Thought Process: Focused and Organized   Affect/Mood: Appropriate   Insight: Good   Judgement: Good   Modes of Intervention: Activity, Discussion, and Education  Patient Response to Interventions:  Attentive, Engaged, Interested , and Receptive   Plan: Continue to engage patient in PT/OT groups 1 - 2x/week.  CHARM Hamilton Bertin PT, DPT 03/25/2024, 1:46 PM

## 2024-03-25 NOTE — BHH Counselor (Signed)
 CSW received note from nursing staff that pt's son called concerned regarding discharge instructions.   CSW contacted pt's wife Ardeen) and son Blaise) .   CSW provider went over discharge medications and appointments per Josh's request.   CSW and provider gave pt's son information regarding crisis management ([phone], Urgent Care Information etc).   Pt's son and wife agreeable to discharge.   Lum Croft, MSW, [place] 03/25/2024 1:32 PM

## 2024-03-25 NOTE — Care Management Important Message (Signed)
 Important Message  Patient Details  Name: Jeff Stewart MRN: [id number] Date of Birth: 05-05-1955   Important Message Given:  Yes - Medicare IM  Pt given blank IM due to IM not being processed by medical records at this time.    [address], LCSWA 03/25/2024, 10:24 AM

## 2024-03-25 NOTE — Plan of Care (Signed)
   Problem: Coping: Goal: Ability to verbalize frustrations and anger appropriately will improve Outcome: Progressing

## 2024-03-25 NOTE — B[facility] Suicide Risk Assessment (Signed)
 [facility] INPATIENT:  Family/Significant Other Suicide Prevention Education  Suicide Prevention Education:  Education Completed; Jeff Stewart, wife, [phone] ,  (name of family member/significant other) has been identified by the patient as the family member/significant other with whom the patient will be residing, and identified as the person(s) who will aid the patient in the event of a mental health crisis (suicidal ideations/suicide attempt).  With written consent from the patient, the family member/significant other has been provided the following suicide prevention education, prior to the and/or following the discharge of the patient.  The suicide prevention education provided includes the following: Suicide risk factors Suicide prevention and interventions National Suicide Hotline telephone number [facility] assessment telephone number [facility] Emergency Assistance 911 [facility] and/or Residential Mobile Crisis Unit telephone number  Request made of family/significant other to: Remove weapons (e.g., guns, rifles, knives), all items previously/currently identified as safety concern.   Remove drugs/medications (over-the-counter, prescriptions, illicit drugs), all items previously/currently identified as a safety concern.  The family member/significant other verbalizes understanding of the suicide prevention education information provided.  The family member/significant other agrees to remove the items of safety concern listed above.  Jeff Stewart 03/25/2024, 1:44 PM

## 2024-03-25 NOTE — Plan of Care (Signed)
" °  Problem: Education: Goal: Knowledge of [facility] Education information/materials will improve Outcome: Adequate for Discharge Goal: Emotional status will improve Outcome: Adequate for Discharge Goal: Mental status will improve Outcome: Adequate for Discharge Goal: Verbalization of understanding the information provided will improve Outcome: Adequate for Discharge   Problem: Activity: Goal: Interest or engagement in activities will improve Outcome: Adequate for Discharge Goal: Sleeping patterns will improve Outcome: Adequate for Discharge   Problem: Coping: Goal: Ability to verbalize frustrations and anger appropriately will improve Outcome: Adequate for Discharge Goal: Ability to demonstrate self-control will improve Outcome: Adequate for Discharge   Problem: Health Behavior/Discharge Planning: Goal: Identification of resources available to assist in meeting health care needs will improve Outcome: Adequate for Discharge Goal: Compliance with treatment plan for underlying cause of condition will improve Outcome: Adequate for Discharge   Problem: Physical Regulation: Goal: Ability to maintain clinical measurements within normal limits will improve Outcome: Adequate for Discharge   Problem: Safety: Goal: Periods of time without injury will increase Outcome: Adequate for Discharge   Problem: Education: Goal: Utilization of techniques to improve thought processes will improve Outcome: Adequate for Discharge Goal: Knowledge of the prescribed therapeutic regimen will improve Outcome: Adequate for Discharge   Problem: Activity: Goal: Interest or engagement in leisure activities will improve Outcome: Adequate for Discharge Goal: Imbalance in normal sleep/wake cycle will improve Outcome: Adequate for Discharge   Problem: Coping: Goal: Coping ability will improve Outcome: Adequate for Discharge Goal: Will verbalize feelings Outcome: Adequate for Discharge    Problem: Health Behavior/Discharge Planning: Goal: Ability to make decisions will improve Outcome: Adequate for Discharge Goal: Compliance with therapeutic regimen will improve Outcome: Adequate for Discharge   Problem: Role Relationship: Goal: Will demonstrate positive changes in social behaviors and relationships Outcome: Adequate for Discharge   Problem: Safety: Goal: Ability to disclose and discuss suicidal ideas will improve Outcome: Adequate for Discharge Goal: Ability to identify and utilize support systems that promote safety will improve Outcome: Adequate for Discharge   Problem: Self-Concept: Goal: Will verbalize positive feelings about self Outcome: Adequate for Discharge Goal: Level of anxiety will decrease Outcome: Adequate for Discharge   Problem: Coping: Goal: Coping ability will improve Outcome: Adequate for Discharge   "

## 2024-03-25 NOTE — Group Note (Signed)
 Date:  03/25/2024 Time:  12:03 PM  Group Topic/Focus:  Goals Group:   The focus of this group is to help patients establish daily goals to achieve during treatment and discuss how the patient can incorporate goal setting into their daily lives to aide in recovery.    Participation Level:  Active  Participation Quality:  Appropriate and Attentive  Affect:  Appropriate  Cognitive:  Alert and Appropriate  Insight: Appropriate  Engagement in Group:  Engaged  Modes of Intervention:  Activity and Discussion  Additional Comments:     Maglione,Ceri Mayer E 03/25/2024, 12:03 PM

## 2024-03-27 ENCOUNTER — Ambulatory Visit (HOSPITAL_COMMUNITY)

## 2024-03-27 ENCOUNTER — Encounter: Payer: Self-pay | Admitting: Family Medicine

## 2024-03-27 DIAGNOSIS — F411 Generalized anxiety disorder: Secondary | ICD-10-CM

## 2024-03-27 DIAGNOSIS — F321 Major depressive disorder, single episode, moderate: Secondary | ICD-10-CM

## 2024-03-28 ENCOUNTER — Ambulatory Visit (INDEPENDENT_AMBULATORY_CARE_PROVIDER_SITE_OTHER): Admitting: Family Medicine

## 2024-03-28 ENCOUNTER — Encounter: Payer: Self-pay | Admitting: Family Medicine

## 2024-03-28 VITALS — BP 132/77 | HR 87 | Ht 73.0 in | Wt 244.0 lb

## 2024-03-28 DIAGNOSIS — F322 Major depressive disorder, single episode, severe without psychotic features: Secondary | ICD-10-CM | POA: Diagnosis not present

## 2024-03-28 DIAGNOSIS — G479 Sleep disorder, unspecified: Secondary | ICD-10-CM | POA: Diagnosis not present

## 2024-03-28 DIAGNOSIS — R259 Unspecified abnormal involuntary movements: Secondary | ICD-10-CM | POA: Diagnosis not present

## 2024-03-28 DIAGNOSIS — R5383 Other fatigue: Secondary | ICD-10-CM | POA: Diagnosis not present

## 2024-03-28 DIAGNOSIS — F5104 Psychophysiologic insomnia: Secondary | ICD-10-CM

## 2024-03-28 MED ORDER — QUETIAPINE FUMARATE 50 MG PO TABS: 100.0000 mg | ORAL_TABLET | Freq: Every day | ORAL | 1 refills | Status: AC

## 2024-03-28 NOTE — Progress Notes (Signed)
 "  BP 132/77   Pulse 87   Ht 6' 1 (1.854 m)   Wt 244 lb (110.7 kg)   SpO2 97%   BMI 32.19 kg/m    Subjective:   Patient ID: Jeff Stewart, male    DOB: 10-Mar-1956, 69 y.o.   MRN: [id number]  HPI: Jeff Stewart is a 69 y.o. male presenting on 03/28/2024 for Medical Management of Chronic Issues, Insomnia, and suicidal ideation   Discussed the use of AI scribe software for clinical note transcription with the patient, who gave verbal consent to proceed.  History of Present Illness   Jeff Stewart is a 69 year old male with depression, anxiety, and sleep disturbances who presents with ongoing sleep issues and involuntary movements.  Sleep disturbances - Significant sleep disturbances characterized by alternating periods of hypersomnia and prolonged insomnia - During insomnia episodes, remains awake for up to a week, resulting in mental and physical exhaustion - BiPAP machine used for sleep apnea is effective when not experiencing insomnia - Vivid dreams and morning grogginess, attributed to inadequate rest - Monthly episodes of sleep disturbances since spinal cord stimulator removal and hospitalization for suspected sepsis approximately one year ago  Involuntary movements - Involuntary leg and arm jerking occurs primarily during insomnia episodes - No involuntary movements when sleep is normal - History of restless leg syndrome, previously evaluated and not attributed to current symptoms  Mood and anxiety symptoms - History of depression and anxiety, managed with Zoloft  - Seroquel  recently added to aid with sleep - Lorazepam  prescribed - Some improvement in sleep but persistent exhaustion - Seen by a therapist for intake session - Scheduled to see a neuropsychiatrist at the end of January - Virtual visit with psychiatrist during emergency room visit  Chronic pain management - Chronic back pain managed with a morphine pump - Occasional use of Tylenol  for pain control -  Spinal cord stimulator removed approximately one year ago          Relevant past medical, surgical, family and social history reviewed and updated as indicated. Interim medical history since our last visit reviewed. Allergies and medications reviewed and updated.  Review of Systems  Constitutional:  Positive for fatigue. Negative for chills and fever.  Eyes:  Negative for visual disturbance.  Respiratory:  Negative for shortness of breath and wheezing.   Cardiovascular:  Negative for chest pain and leg swelling.  Musculoskeletal:  Positive for arthralgias, back pain, myalgias and neck pain.  Skin:  Negative for rash.  Neurological:  Negative for dizziness and light-headedness.  Psychiatric/Behavioral:  Positive for dysphoric mood, sleep disturbance and suicidal ideas. Negative for self-injury. The patient is nervous/anxious.   All other systems reviewed and are negative.   Per HPI unless specifically indicated above   Allergies as of 03/28/2024       Reactions   Nalbuphine Nausea And Vomiting, Rash, Shortness Of Breath   nalbuphine   Nubain [nalbuphine Hcl] Rash        Medication List        Accurate as of March 28, 2024  4:10 PM. If you have any questions, ask your nurse or doctor.          acetaminophen  500 MG tablet Commonly known as: TYLENOL  Take 500 mg by mouth every 6 (six) hours as needed.   fluticasone  50 MCG/ACT nasal spray Commonly known as: FLONASE  Place 2 sprays into both nostrils daily.   hydrochlorothiazide  25 MG tablet Commonly known as: HYDRODIURIL  Take 1  tablet (25 mg total) by mouth daily. TAKE 1 TABLET DAILY.   LORazepam  0.5 MG tablet Commonly known as: ATIVAN  Take 1 tablet (0.5 mg total) by mouth 3 (three) times daily.   losartan  50 MG tablet Commonly known as: COZAAR  Take 1 tablet (50 mg total) by mouth 2 (two) times daily.   ondansetron  4 MG disintegrating tablet Commonly known as: ZOFRAN -ODT Take 1 tablet (4 mg total) by mouth  every 8 (eight) hours as needed for nausea or vomiting.   pantoprazole  40 MG tablet Commonly known as: PROTONIX  Take 1 tablet (40 mg total) by mouth daily.   QUEtiapine  50 MG tablet Commonly known as: SEROQUEL  Take 2 tablets (100 mg total) by mouth at bedtime. What changed:  how much to take when to take this Changed by: Fonda Levins, MD   sertraline  50 MG tablet Commonly known as: ZOLOFT  Take 3 tablets (150 mg total) by mouth at bedtime.   SYRINGE 3CC/21GX1-1/4 21G X 1-1/4 3 ML Misc 1 each by Does not apply route once a week.   testosterone  cypionate 200 MG/ML injection Commonly known as: DEPOTESTOSTERONE CYPIONATE INJECT 0.5 MLS EVERY 10 DAYS   Vitamin D  50 MCG (2000 UT) Caps Take 2,000 Units by mouth daily with lunch.         Objective:   BP 132/77   Pulse 87   Ht 6' 1 (1.854 m)   Wt 244 lb (110.7 kg)   SpO2 97%   BMI 32.19 kg/m   Wt Readings from Last 3 Encounters:  03/28/24 244 lb (110.7 kg)  03/22/24 236 lb 8 oz (107.3 kg)  03/21/24 241 lb (109.3 kg)    Physical Exam Vitals and nursing note reviewed.  Constitutional:      Appearance: Normal appearance.  Neurological:     Mental Status: He is alert.  Psychiatric:        Mood and Affect: Mood is anxious and depressed.        Thought Content: Thought content includes suicidal ideation. Thought content does not include suicidal plan.    Physical Exam   CHEST: Lungs clear to auscultation. CARDIOVASCULAR: Heart sounds regular.         Assessment & Plan:   Problem List Items Addressed This Visit       Other   Fatigue   Relevant Medications   QUEtiapine  (SEROQUEL ) 50 MG tablet   Other Relevant Orders   Ambulatory referral to Neurology   Other Visit Diagnoses       Depression, major, single episode, severe (HCC)    -  Primary   Relevant Medications   QUEtiapine  (SEROQUEL ) 50 MG tablet     Psychophysiological insomnia       Relevant Medications   QUEtiapine  (SEROQUEL ) 50 MG tablet      Involuntary movements       Relevant Orders   Ambulatory referral to Neurology     Sleep disturbance       Relevant Orders   Ambulatory referral to Neurology          Major depressive disorder, single episode, severe Severe exacerbation with hopelessness and suicidal ideations. Recent initiation of Seroquel  and lorazepam  with slow improvement. Awaiting psychiatric evaluation in March, neuropsychiatrist appointment on January 30th with Dr. Jan. - Continue Seroquel  and lorazepam . - Attend neuropsychiatrist appointment with Dr. Jan on January 30th. - Research neuropsychiatrist options for earlier evaluation if possible.  Psychophysiologic insomnia Chronic insomnia with some improvement in sleep onset from Seroquel . Possible contribution from anxiety  and chronic pain. Consideration of neurologic factors due to spinal cord stimulator removal. - Continue Seroquel  for sleep. - Consider neurology referral for further evaluation of potential neurologic causes. - Monitor sleep patterns and report changes.  Chronic pain syndrome Chronic back pain exacerbated by insomnia and movement during sleep. Managed with morphine pump and Tylenol  for additional relief. - Use Tylenol  as needed for additional pain relief. - Contact back doctor to discuss impact of spinal stimulator removal on pain and mood.  Obstructive sleep apnea Managed with BiPAP. Involuntary movement during sleep possibly related to apnea or anxiety. Consideration of neurologic factors due to spinal cord stimulator removal. - Monitor for involuntary movements during sleep with mask on. - Consider neurology referral for further evaluation of potential neurologic causes.          Follow up plan: Return in about 4 weeks (around 04/25/2024), or if symptoms worsen or fail to improve, for Anxiety depression recheck.  Counseling provided for all of the vaccine components Orders Placed This Encounter  Procedures   Ambulatory  referral to Neurology    Fonda Levins, MD [facility] 03/28/2024, 4:10 PM     "

## 2024-04-01 ENCOUNTER — Telehealth: Payer: Self-pay | Admitting: Family Medicine

## 2024-04-01 ENCOUNTER — Encounter (HOSPITAL_COMMUNITY): Payer: Self-pay

## 2024-04-01 ENCOUNTER — Ambulatory Visit (HOSPITAL_COMMUNITY)

## 2024-04-01 DIAGNOSIS — F411 Generalized anxiety disorder: Secondary | ICD-10-CM

## 2024-04-01 DIAGNOSIS — F321 Major depressive disorder, single episode, moderate: Secondary | ICD-10-CM

## 2024-04-01 NOTE — Telephone Encounter (Signed)
 Copied from CRM #[id number]. Topic: Medical Record Request - Provider/Facility Request >> Apr 01, 2024  1:16 PM Diannia H wrote: Reason for CRM: [facility] is requesting a copy of the patients sleep study. Could you assist? Fax number is [phone]. He has an appointment this afternoon at 4 and they need it before then.

## 2024-04-01 NOTE — Telephone Encounter (Signed)
 Spoke with someone at [facility] informed her that pt had sleep study done at [facility] and she would have to contact them to obtain a copy of the sleep study.
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
## 2024-04-04 ENCOUNTER — Ambulatory Visit: Admitting: Neurology

## 2024-04-04 ENCOUNTER — Encounter: Payer: Self-pay | Admitting: Neurology

## 2024-04-04 VITALS — BP 131/76 | HR 87 | Ht 73.0 in | Wt 246.0 lb

## 2024-04-04 DIAGNOSIS — M4802 Spinal stenosis, cervical region: Secondary | ICD-10-CM

## 2024-04-04 DIAGNOSIS — G4739 Other sleep apnea: Secondary | ICD-10-CM

## 2024-04-04 DIAGNOSIS — G4731 Primary central sleep apnea: Secondary | ICD-10-CM | POA: Diagnosis not present

## 2024-04-04 DIAGNOSIS — G4737 Central sleep apnea in conditions classified elsewhere: Secondary | ICD-10-CM | POA: Diagnosis not present

## 2024-04-04 DIAGNOSIS — Z9989 Dependence on other enabling machines and devices: Secondary | ICD-10-CM | POA: Diagnosis not present

## 2024-04-04 DIAGNOSIS — F332 Major depressive disorder, recurrent severe without psychotic features: Secondary | ICD-10-CM

## 2024-04-04 DIAGNOSIS — G4733 Obstructive sleep apnea (adult) (pediatric): Secondary | ICD-10-CM | POA: Diagnosis not present

## 2024-04-04 NOTE — Progress Notes (Signed)
 "        Provider:  Dedra Gores, MD  Primary Care Physician:  Dettinger, Fonda LABOR, MD [address] [place] [address]     Referring Provider: Dettinger, Fonda LABOR, Md [address] [place],  [place] [address]          Chief Complaint according to patient   Patient presents with:                HISTORY OF PRESENT ILLNESS:  Jeff Stewart is a 69 y.o. male patient who is here for revisit 04/04/2024 after an ED visit.  Pt is distressed, depressed,  reports he has been experiencing  gain a bout of involuntary movements with leg and body jerks, after spinal cord stimulator removal,  since Dec 2024 on and off - usually the spells were 1-2 nights in duration,  now once week non stop.  Video evidence shown. SABRA He usually gets about 6-7 hrs of sleep.   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  He has known complex apnea. BiPAP.   Wife wants to get help for him, seeking help for his discomfort with leg and back pain. Has been followed - Pain pump was refilled yesterday.     he had 2-3 weeks ago the new episode that kicked in : was for a week desperate, 03-18-2024  finally presented to Ed 03-21-2024,  after not resting, of constant leg movements ,  myoclonus jerking - he attributed to  back surgery, last time after removal of the spinal cord stimulator.      [facility] - Arrival date & time: 03/21/24  2007  Patient presents with: Suicidal, Fatigue, and Insomnia  ADOLPHUS HANF is a 69 y.o. male. Patient to ED with family at bedside reporting suicidal ideations. He has a history of significant insomnia stating he will go for days without sleep, then sleep for a solid day, then stay up for days more. He has seen several doctors and tried multiple medications, including Lunesta  and Vistaril , that have not offered any relief. Currently he has been awake for 3 days. No AVH, no history of  hallucinations. His wife states he becomes depressed and had talked about suicide in the past but has not attempted. The patient, his wife and son feel his symptoms are more severe than in the past and family is concerned he will act on his SI.   The history is provided by the patient, the spouse and a relative. No language interpreter was used.  Insomnia     SAMAN UMSTEAD is a 69 y.o. male.    Mr. Corinne has a medically complex patient with multiple orthopedic conditions, multiple back surgeries and neck surgery who suffers from chronic pain.  He has an implanted morphine pump.  He states that when the pain becomes severe he cannot sleep.  He currently states he has not slept for a couple of days.  This is making him feel restless and irritable, fatigued and weak.  He goes in cycles like this with frequent flares of severe pain.  He was seen here for a similar episode recently.  He also has severe central sleep apnea.  His BiPAP was recently calibrated on 11/13/2023.  He states now it so strong and feels like it will blow off my face.  He has pain and weakness all  across his neck and shoulder girdle.  I looked in his chart and his sed rate was done in January, it occurs to me this could be a PMR.  Would repeat the sed rate today.  I think it is just just chronic pain syndrome, with a flare. Because of his morphine pump additional narcotics are not appropriate.  He is already on Celebrex  as an anti-inflammatory.  He has baclofen  to take as a muscle relaxer.  He does not tolerate gabapentin .  He took pregabalin  for short period of time and states this was helpful.  He is open to trying pregabalin  again.  He does have a pain specialty provider, neurologist, orthopedist, family doctor, and multiple specialties managing his chronic pain and sleep disorder.   Dr Buck read  the preceding baseline study  in my absence- and ordered the return to full night PAP titration.VIKAS WEGMANN is a 69 year-old  patient who had years ago been diagnosed with OSA, was unable to tolerate CPAP and had undergone several ENT surgeries. 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  HST was recently done somewhere ( not included in his referral ) and he had seen ENT Dr Carlie but failed the endoscopy for [facility] -   He reports excessive daytime sleepiness and is willing to try other modalities than CPAP if this should still not be tolerated well.  The Epworth Sleepiness Scale was 23 /24 (scores above or equal to 10 are suggestive of hypersomnolence).    He is status post nasal septoplasty,  UPPP, cervical fusion, back surgery dr Gust, chronic pain in therapy at [facility] . All specialists through [facility] , Primary Care through [facility] Dr Dettinger.    I have only the patients verbal report as to having had a BIPAP machine and tried several masks in the year 2014. He has complex apnea, central sleep apnea.    He is now presenting after a PSG diagnosed with severe complex sleep apnea and started on auto PAP, this in return caused more central apnea to emerge to an AHI of over 30/h. He returned for an in lab titration and ended up responding somewhat to 18/ 14 cm water , but  now shows central apneas emerging further while OSA is treated.    Insomnia is improved         Review of Systems: Out of a complete 14 system review, the patient complains of only the following symptoms, and all other reviewed systems are negative.:   SLEEPINESS ?  How likely are you to doze in the following situations: 0 = not likely, 1 = slight chance, 2 = moderate chance, 3 = high chance  Sitting and Reading? Watching Television? Sitting inactive in a public place (theater or meeting)? Lying down in the afternoon when circumstances permit? Sitting and talking to someone? Sitting quietly after lunch without alcohol? In a car, while stopped for a few minutes in  traffic? As a passenger in a car for an hour without a break?  Total = 10  FSS: 60/ 63    Social History   Socioeconomic History   Marital status: Married    Spouse name: Not on file   Number of children: 3   Years of education: Not on file   Highest education level: GED or equivalent  Occupational History   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  Tobacco Use   Smoking status: Never  Smokeless tobacco: Never  Vaping Use   Vaping status: Never Used  Substance and Sexual Activity   Alcohol use: No   Drug use: No   Sexual activity: Not Currently    Birth control/protection: Post-menopausal    Comment: married for 1972  Other Topics Concern   Not on file  Social History Narrative   Lives with wife    Children live nearby   Retired    1-2 cup of coffee    Social Drivers of Health   Tobacco Use: Low Risk (04/04/2024)   Patient History    Smoking Tobacco Use: Never    Smokeless Tobacco Use: Never    Passive Exposure: Not on file  Financial Resource Strain: Low Risk (03/19/2024)   Overall Financial Resource Strain (CARDIA)    Difficulty of Paying Living Expenses: Not hard at all  Food Insecurity: No Food Insecurity (03/22/2024)   Epic    Worried About Radiation Protection Practitioner of Food in the Last Year: Never true    Ran Out of Food in the Last Year: Never true  Transportation Needs: No Transportation Needs (03/22/2024)   Epic    Lack of Transportation (Medical): No    Lack of Transportation (Non-Medical): No  Physical Activity: Insufficiently Active (03/19/2024)   Exercise Vital Sign    Days of Exercise per Week: 1 day    Minutes of Exercise per Session: 20 min  Stress: No Stress Concern Present (03/19/2024)   [employer] of Occupational Health - Occupational Stress Questionnaire    Feeling of Stress: Only a little  Social Connections: Socially Integrated (03/22/2024)   Social Connection and Isolation Panel    Frequency of Communication with Friends and Family:  Three times a week    Frequency of Social Gatherings with Friends and Family: Twice a week    Attends Religious Services: More than 4 times per year    Active Member of Clubs or Organizations: Yes    Attends Banker Meetings: 1 to 4 times per year    Marital Status: Married  Depression (PHQ2-9): High Risk (03/28/2024)   Depression (PHQ2-9)    PHQ-2 Score: 27  Alcohol Screen: Low Risk (03/22/2024)   Alcohol Screen    Last Alcohol Screening Score (AUDIT): 0  Housing: Low Risk (03/22/2024)   Epic    Unable to Pay for Housing in the Last Year: No    Number of Times Moved in the Last Year: 0    Homeless in the Last Year: No  Utilities: Not At Risk (03/22/2024)   Epic    Threatened with loss of utilities: No  Health Literacy: Adequate Health Literacy (08/07/2023)   B1300 Health Literacy    Frequency of need for help with medical instructions: Never    Family History  Problem Relation Age of Onset   Stroke Mother 45   CAD Father 32   Emphysema Father    CAD Sister    Hypertension Sister    Hyperlipidemia Sister    Cancer Sister        melanoma   Cancer Sister    Lung cancer Sister    CAD Brother 31       CABG   Alcohol abuse Brother    Hypertension Brother    Stroke Brother    CAD Brother    Cirrhosis Brother    Alcohol abuse Brother    Hypertension Brother    Cancer Brother    Lung cancer Brother    Cancer Brother  Lung cancer Brother    Migraines Other    Colon cancer Neg Hx    Colon polyps Neg Hx    Esophageal cancer Neg Hx    Rectal cancer Neg Hx    Stomach cancer Neg Hx    Seizures Neg Hx     Past Medical History:  Diagnosis Date   Anxiety    Carpal tunnel syndrome, bilateral    Cataract    removed years ago   Chronic back pain    internal morphine pump   DDD (degenerative disc disease)    neck, lumbar   Depression    Dyslipidemia    diet controlled   GERD (gastroesophageal reflux disease)    past hx- had nissen fundiplication     Hypertension    borderline   Sleep apnea    wears C-PAP   Status post insertion of spinal cord stimulator     Past Surgical History:  Procedure Laterality Date   BACK SURGERY     x 6   CARPAL TUNNEL RELEASE     bilateral   COLONOSCOPY     ELBOW SURGERY     INGUINAL HERNIA REPAIR Right    INTERNAL MORPHINE PUMP      KNEE ARTHROSCOPY     NASAL SEPTUM SURGERY     x 6   neck fusion   07/18/2023   neck surgery pe rpt   NISSEN FUNDOPLICATION     SKIN CANCER EXCISION     SPINAL CORD STIMULATOR INSERTION     SPINAL CORD STIMULATOR REMOVAL  02/2023   STOMACH SURGERY     Nissen Fundiplication   thumb surgery     TOTAL KNEE ARTHROPLASTY Left 04/24/2018   Procedure: TOTAL KNEE ARTHROPLASTY;  Surgeon: Beverley Evalene BIRCH, MD;  Location: [facility];  Service: Orthopedics;  Laterality: Left;   UPPER GASTROINTESTINAL ENDOSCOPY       Medications Ordered Prior to Encounter[1]  Allergies[2]   DIAGNOSTIC DATA (LABS, IMAGING, TESTING) - I reviewed patient records, labs, notes, testing and imaging myself where available.  Lab Results  Component Value Date   WBC 9.4 03/21/2024   HGB 15.4 03/21/2024   HCT 44.2 03/21/2024   MCV 84.4 03/21/2024   PLT 237 03/21/2024      Component Value Date/Time   NA 137 03/21/2024 2112   NA 141 11/08/2023 0924   K 3.4 (L) 03/21/2024 2112   CL 100 03/21/2024 2112   CO2 22 03/21/2024 2112   GLUCOSE 100 (H) 03/21/2024 2112   BUN 22 03/21/2024 2112   BUN 19 11/08/2023 0924   CREATININE 0.87 03/21/2024 2112   CALCIUM 9.7 03/21/2024 2112   PROT 7.1 03/21/2024 2112   PROT 6.6 11/08/2023 0924   ALBUMIN 4.4 03/21/2024 2112   ALBUMIN 4.5 11/08/2023 0924   AST 16 03/21/2024 2112   ALT 18 03/21/2024 2112   ALKPHOS 86 03/21/2024 2112   BILITOT 0.8 03/21/2024 2112   BILITOT 0.4 11/08/2023 0924   GFRNONAA >60 03/21/2024 2112   GFRAA 95 10/30/2019 0836   Lab Results  Component Value Date   CHOL 173 03/23/2024   HDL 44 03/23/2024   LDLCALC 110 (H)  03/23/2024   TRIG 97 03/23/2024   CHOLHDL 3.9 03/23/2024   Lab Results  Component Value Date   HGBA1C 5.5 03/23/2024   Lab Results  Component Value Date   VITAMINB12 275 09/10/2021   Lab Results  Component Value Date   TSH 1.330 03/23/2024    PHYSICAL EXAM:  Vitals:   04/04/24 0952  BP: 131/76  Pulse: 87   No data found. Body mass index is 32.46 kg/m.   Wt Readings from Last 3 Encounters:  04/04/24 246 lb (111.6 kg)  03/28/24 244 lb (110.7 kg)  03/21/24 241 lb (109.3 kg)     Ht Readings from Last 3 Encounters:  04/04/24 6' 1 (1.854 m)  03/28/24 6' 1 (1.854 m)  03/21/24 6' 1 (1.854 m)      General: The patient is awake, alert and appears not in acute distress and groomed. Head: Normocephalic, atraumatic.  Neck is supple.  Mallampati  UPPP ,  neck circumference:19.5 inches . Nasal airflow not fully patent.  left is occluded . Retrognathia is not seen.  Dental status:  dentures  Cardiovascular:  Regular rate and cardiac rhythm by pulse,  without distended neck veins. Respiratory: Lungs are clear to auscultation.  Skin:  Without evidence of ankle edema, or rash. Trunk: The patient's posture is erect.   NEUROLOGIC EXAM: The patient is awake and alert, oriented to place and time.   Memory subjective described as intact.  Attention span & concentration ability appears limited - he can't remember dates and places of medical procedures.  Speech is fluent,  without  dysarthria, dysphonia or aphasia.  Mood and affect are appropriate.   Cranial nerves: no loss of smell or taste reported  Pupils are equal and briskly reactive to light. Funduscopic exam deferred..  Extraocular movements in vertical and horizontal planes were intact and without nystagmus. No Diplopia. Visual fields by finger perimetry are intact. Hearing was intact to soft voice and finger rubbing.    Facial sensation intact to fine touch.  Facial motor strength is symmetric and tongue and uvula  move midline.  Neck ROM : rotation, tilt and flexion extension were normal for age and shoulder shrug was symmetrical.    Motor exam:  Symmetric bulk, tone and ROM.   Normal tone without cog wheeling, symmetric grip strength .    ASSESSMENT AND PLAN :   69 y.o. 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  He became suicidal after a week of sleep deprivation:   He will continue with outpatient  neuropsychology / therapy  in a couple of weeks. He should remain on Seroquel , take 50 mg at 4 PM and 50 mg at 9 PM to avoid grogginess in AM.   It has helped him sleep.    2)  Complex apnea, treatment emerging - we treat with BiPAP.   Will continue. 69 y.o. year old male  here with: COMPLEX SLEEP APNEA on BIPAP, FFM , edentulous.  1) BIPAP problems, I think its a mask fit issue rather than a pressure issue, he is on a morphine pump that's creating his central apnea    2) dentures have to stay in at night -    3)  nasal spray to use for better airflow, flonase  and saline nasal spray .   4) chin strap - can help to reduce oral air outflow.  5)  I do not prescribe a new medication.   I recommend consulting with Physical Redicine and Rehabilitation, EMG/NCS.     I would like to thank Dettinger, Fonda LABOR, MD for allowing me to meet with this pleasant patient.   Sleep Clinic Patients are generally offered input on sleep hygiene, life style changes and how to improve compliance  with medical treatment where applicable. Review and reiteration of good sleep hygiene measures is offered to any sleep clinic patient, be it in the first consultation or with any follow up visits.    Any patient with sleepiness should be cautioned not to drive, work at heights, or operate dangerous or heavy equipment when feeling tired or sleepy.      The patient will be seen in follow-up in the sleep clinic at [facility] for  discussion of test results, sleep related symptoms and treatment compliance review, further management strategies, etc.   The referring provider will be notified of the test results.   The patient's condition requires frequent monitoring and adjustments in the treatment plan, reflecting the ongoing complexity of care.  This provider is the continuing focal point for all needed services for this condition.  After spending a total time of  40  minutes face to face and time for  history taking, physical and neurologic examination, review of laboratory studies,  personal review of imaging studies, reports and results of other testing and review of referral information / records as far as provided in visit,   Electronically signed by: Dedra Gores, MD 04/04/2024 10:32 AM  [facility] Board certified by The [employer] of Sleep Medicine and Diplomate of the [employer] of Sleep Medicine. Board certified In Neurology through the ABPN, Fellow of the [employer] of Neurology.      [1]  Current Outpatient Medications on File Prior to Visit  Medication Sig Dispense Refill   acetaminophen  (TYLENOL ) 500 MG tablet Take 500 mg by mouth every 6 (six) hours as needed.     Cholecalciferol (VITAMIN D ) 50 MCG (2000 UT) CAPS Take 2,000 Units by mouth daily with lunch.     fluticasone  (FLONASE ) 50 MCG/ACT nasal spray Place 2 sprays into both nostrils daily. 16 g 11   hydrochlorothiazide  (HYDRODIURIL ) 25 MG tablet Take 1 tablet (25 mg total) by mouth daily. TAKE 1 TABLET DAILY. 90 tablet 3   LORazepam  (ATIVAN ) 0.5 MG tablet Take 1 tablet (0.5 mg total) by mouth 3 (three) times daily. 90 tablet 0   losartan  (COZAAR ) 50 MG tablet Take 1 tablet (50 mg total) by mouth 2 (two) times daily. 180 tablet 3   ondansetron  (ZOFRAN -ODT) 4 MG disintegrating tablet Take 1 tablet (4 mg total) by mouth every 8 (eight) hours as needed for nausea or vomiting. 15 tablet 0    pantoprazole  (PROTONIX ) 40 MG tablet Take 1 tablet (40 mg total) by mouth daily. 90 tablet 3   QUEtiapine  (SEROQUEL ) 50 MG tablet Take 2 tablets (100 mg total) by mouth at bedtime. 60 tablet 1   sertraline  (ZOLOFT ) 50 MG tablet Take 3 tablets (150 mg total) by mouth at bedtime. 90 tablet 0   Syringe/Needle, Disp, (SYRINGE 3CC/21GX1-1/4) 21G X 1-1/4 3 ML MISC 1 each by Does not apply route once a week. 100 each 0   testosterone  cypionate (DEPOTESTOSTERONE CYPIONATE) 200 MG/ML injection INJECT 0.5 MLS EVERY 10 DAYS 10 mL 0   No current facility-administered medications on file prior to visit.  [2]  Allergies Allergen Reactions   Nalbuphine Nausea And Vomiting, Rash and Shortness Of Breath    nalbuphine   Nubain [Nalbuphine Hcl] Rash   "

## 2024-04-04 NOTE — Patient Instructions (Signed)
 ASSESSMENT AND PLAN :   69 y.o. 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  He became suicidal after a week of sleep deprivation:   He will continue with outpatient  neuropsychology / therapy  in a couple of weeks. He should remain on Seroquel , take 50 mg at 4 PM and 50 mg at 9 PM to avoid grogginess in AM.   It has helped him sleep.    2)  Complex apnea, treatment emerging - we treat with BiPAP.   Will continue. 69 y.o. year old male  here with: COMPLEX SLEEP APNEA on BIPAP, FFM , edentulous.  1) BIPAP problems, I think its a mask fit issue rather than a pressure issue, he is on a morphine pump that's creating his central apnea    2) dentures have to stay in at night -    3)  nasal spray to use for better airflow, flonase  and saline nasal spray .   4) chin strap - can help to reduce oral air outflow.  5)  I do not prescribe a new medication.   I recommend consulting with Physical Redicine and Rehabilitation, EMG/NCS.     I would like to thank Dettinger, Fonda LABOR, MD for allowing me to meet with this pleasant patient.   Sleep Clinic Patients are generally offered input on sleep hygiene, life style changes and how to improve compliance with medical treatment where applicable. Review and reiteration of good sleep hygiene measures is offered to any sleep clinic patient, be it in the first consultation or with any follow up visits.

## 2024-04-06 NOTE — Progress Notes (Unsigned)
" ° °  THERAPIST PROGRESS NOTE  Session Time: ***  Participation Level: {BHH PARTICIPATION LEVEL:22264}  Behavioral Response: {Appearance:22683}{BHH LEVEL OF CONSCIOUSNESS:22305}{BHH MOOD:22306}  Type of Therapy: {CHL AMB BH Type of Therapy:21022741}  Treatment Goals addressed: ***  ProgressTowards Goals: {Progress Towards Goals:21014066}  Interventions: {CHL AMB BH Type of Intervention:21022753}  Summary: Jeff Stewart is a 69 y.o. male who presents with ***.   Suicidal/Homicidal: {BHH YES OR NO:22294}{yes/no/with/without intent/plan:22693}  Therapist Response: ***  Plan: Return again in *** weeks.  Diagnosis: Current moderate episode of major depressive disorder without prior episode (HCC)  Collaboration of Care: {BH OP Collaboration of Care:21014065}  Patient/Guardian was advised Release of Information must be obtained prior to any record release in order to collaborate their care with an outside provider. Patient/Guardian was advised if they have not already done so to contact the registration department to sign all necessary forms in order for us  to release information regarding their care.   Consent: Patient/Guardian gives verbal consent for treatment and assignment of benefits for services provided during this visit. Patient/Guardian expressed understanding and agreed to proceed.   Wanda Kellyman 04/06/2024  "

## 2024-04-08 ENCOUNTER — Ambulatory Visit: Admitting: Neurology

## 2024-04-08 NOTE — Progress Notes (Unsigned)
" °  THERAPIST PROGRESS NOTE  Session Time: 04/01/2024 2:00 PM - 3:00 PM  Participation Level: Active  Behavioral Response: Well Groomed Alert Irritable  Type of Therapy: Individual Therapy  Treatment Goals addressed: The treatment plan focuses on addressing two primary goals: reducing symptoms of anxiety and improving sleep quality. Through targeted interventions, Jeff Stewart will work to learn and implement effective anxiety management techniques, challenge anxious thoughts, and establish healthier sleep routines. These efforts aim to decrease anxiety levels and enhance overall functioning by promoting better sleep patterns and emotional well-being over the course of treatment.  ProgressTowards Goals: Initial  Interventions: CBT, Strength-based, Reframing, and Other: Mindfulness  Summary: Jeff Stewart is a 69 y.o. White male who presents with excessive worry, tension, restlessness, difficulty concentrating, fatigue, irritability, and significant sleep disturbance, all of which are impacting his daily functioning. He demonstrated openness to trying new strategies discussed during the session.  We explored several approaches for symptom management, including the STOP method with diaphragmatic breathing, meditative walking, and yoga poses (Bridge, Corpse, and [employer]) for relaxation and sleep support. I provided instructions for each technique and emphasized the importance of consulting his physician before starting new exercises or herbal teas (chamomile, valerian root, lavender) due to possible health considerations. We also discussed celebrating his progress through a simple acknowledgment and reward process to reinforce positive changes.  Jeff Stewart was receptive to these interventions and expressed readiness to incorporate them into his routine. I will continue to support and monitor his progress, making adjustments to the treatment plan as needed to help him achieve his goals of reduced anxiety and  improved sleep.  Suicidal/Homicidal: No, without intent/plan  Therapist Response: Therapist provided education and step-by-step guidance on several techniques, including the STOP method, diaphragmatic breathing, meditative walking, and yoga poses for relaxation. I reinforced the importance of safety by advising him to consult with his physician before starting any new exercise or herbal tea regimen. Additionally, I encouraged him to celebrate his progress using a simple two-step process to reinforce positive changes. I expressed support for his efforts and will continue to monitor his response to these interventions, adjusting the treatment plan as needed to best support his goals for anxiety reduction and improved sleep.  Plan: Return again in 2 weeks.  Diagnosis: Current moderate episode of major depressive disorder without prior episode (HCC)  Generalized anxiety disorder  Collaboration of Care: Other None.  Patient/Guardian was advised Release of Information must be obtained prior to any record release in order to collaborate their care with an outside provider. Patient/Guardian was advised if they have not already done so to contact the registration department to sign all necessary forms in order for us  to release information regarding their care.   Consent: Patient/Guardian gives verbal consent for treatment and assignment of benefits for services provided during this visit. Patient/Guardian expressed understanding and agreed to proceed.   Lawana Hartzell, M.S., [facility], [facility] 04/01/2024  "

## 2024-04-09 NOTE — Progress Notes (Unsigned)
 "  GI Office Note    Referring Provider: Dettinger, Fonda LABOR, MD Primary Care Physician:  Dettinger, Fonda LABOR, MD  Primary Gastroenterologist: Lamar HERO.Rourk, MD  Chief Complaint   Chief Complaint  Patient presents with   elevated liver numbers     Patient thinks he's here for elevated liver numbers and also has alternated diarrhea and constipation     History of Present Illness   Jeff Stewart is a 69 y.o. male presenting today at the request of Dettinger, Fonda LABOR, MD for MASLD and elevated ELF with also need for colonoscopy.   Colonoscopy October 2020 with Dr. Aneita: - One 6 mm polyp in the sigmoid colon, removed with a cold snare. Resected and retrieved. - Internal hemorrhoids.  - The examination was otherwise normal on direct and retroflexion views. - Path: Hyperplastic polyp - Recommended repeat in 10 years.  Labs 12/27/2023: ELF 10.24 Fib-4: 1.67 (indeterminate) Hgb 12.5, platelets normal.   Labs 03/23/2024: A1c 5.5  Today:  Discussed the use of AI scribe software for clinical note transcription with the patient, who gave verbal consent to proceed.  SAFE score: 5 (Intermediate risk of moderate F2 fibrosis)  Recent laboratory testing revealed mildly elevated liver fibrosis markers, including ELF and SAFE scores, with normal liver enzyme levels. He is not aware of any prior elevation in liver enzymes. Most recent labs were completed on March 21, 2024, and he undergoes laboratory testing every three months with endocrinology. No family history of liver disease. He does not consume alcohol and uses ibuprofen only occasionally.  Pantoprazole  is taken empirically for occasional stomach pain, attributed to prior stomach surgery. No current symptoms of reflux. Hydrochlorothiazide  is used for blood pressure control, and there has been no swelling in the legs or ankles. Testosterone  is administered every ten days for hormonal management. Chronic back pain is managed with a  morphine pump, which has been replaced multiple times over the past twenty years.  Chronic constipation is present, which he associates with his medications. He is using Metamucil, two tablespoons once daily, has improved bowel movements to daily frequency. He denies diarrhea, blood in stool, or black stool. Last colonoscopy was in 2020, with recommendation for repeat in four years from now unless symptoms change.  A short course of Zofran  was used for nausea and fatigue, which have since resolved. No yellowing of the skin or eyes, pruritus, rashes, vision changes, abdominal pain, or new swelling in the legs.      Wt Readings from Last 6 Encounters:  04/10/24 252 lb 12.8 oz (114.7 kg)  04/04/24 246 lb (111.6 kg)  03/28/24 244 lb (110.7 kg)  03/21/24 241 lb (109.3 kg)  03/19/24 241 lb 9.6 oz (109.6 kg)  02/22/24 255 lb (115.7 kg)    Body mass index is 33.35 kg/m.  Current Outpatient Medications  Medication Sig Dispense Refill   acetaminophen  (TYLENOL ) 500 MG tablet Take 500 mg by mouth every 6 (six) hours as needed.     Cholecalciferol (VITAMIN D ) 50 MCG (2000 UT) CAPS Take 2,000 Units by mouth daily with lunch.     fluticasone  (FLONASE ) 50 MCG/ACT nasal spray Place 2 sprays into both nostrils daily. 16 g 11   hydrochlorothiazide  (HYDRODIURIL ) 25 MG tablet Take 1 tablet (25 mg total) by mouth daily. TAKE 1 TABLET DAILY. 90 tablet 3   LORazepam  (ATIVAN ) 0.5 MG tablet Take 1 tablet (0.5 mg total) by mouth 3 (three) times daily. 90 tablet 0   losartan  (COZAAR ) 50 MG  tablet Take 1 tablet (50 mg total) by mouth 2 (two) times daily. 180 tablet 3   ondansetron  (ZOFRAN -ODT) 4 MG disintegrating tablet Take 1 tablet (4 mg total) by mouth every 8 (eight) hours as needed for nausea or vomiting. 15 tablet 0   pantoprazole  (PROTONIX ) 40 MG tablet Take 1 tablet (40 mg total) by mouth daily. 90 tablet 3   QUEtiapine  (SEROQUEL ) 50 MG tablet Take 2 tablets (100 mg total) by mouth at bedtime. 60 tablet 1    sertraline  (ZOLOFT ) 50 MG tablet Take 3 tablets (150 mg total) by mouth at bedtime. 90 tablet 0   Syringe/Needle, Disp, (SYRINGE 3CC/21GX1-1/4) 21G X 1-1/4 3 ML MISC 1 each by Does not apply route once a week. 100 each 0   testosterone  cypionate (DEPOTESTOSTERONE CYPIONATE) 200 MG/ML injection INJECT 0.5 MLS EVERY 10 DAYS 10 mL 0   No current facility-administered medications for this visit.    Past Medical History:  Diagnosis Date   Anxiety    Carpal tunnel syndrome, bilateral    Cataract    removed years ago   Chronic back pain    internal morphine pump   DDD (degenerative disc disease)    neck, lumbar   Depression    Dyslipidemia    diet controlled   GERD (gastroesophageal reflux disease)    past hx- had nissen fundiplication    Hypertension    borderline   Sleep apnea    wears C-PAP   Status post insertion of spinal cord stimulator     Past Surgical History:  Procedure Laterality Date   BACK SURGERY     x 6   CARPAL TUNNEL RELEASE     bilateral   COLONOSCOPY     ELBOW SURGERY     INGUINAL HERNIA REPAIR Right    INTERNAL MORPHINE PUMP      KNEE ARTHROSCOPY     NASAL SEPTUM SURGERY     x 6   neck fusion   07/18/2023   neck surgery pe rpt   NISSEN FUNDOPLICATION     SKIN CANCER EXCISION     SPINAL CORD STIMULATOR INSERTION     SPINAL CORD STIMULATOR REMOVAL  02/2023   STOMACH SURGERY     Nissen Fundiplication   thumb surgery     TOTAL KNEE ARTHROPLASTY Left 04/24/2018   Procedure: TOTAL KNEE ARTHROPLASTY;  Surgeon: Beverley Evalene BIRCH, MD;  Location: [facility];  Service: Orthopedics;  Laterality: Left;   UPPER GASTROINTESTINAL ENDOSCOPY      Family History  Problem Relation Age of Onset   Stroke Mother 42   CAD Father 82   Emphysema Father    CAD Sister    Hypertension Sister    Hyperlipidemia Sister    Cancer Sister        melanoma   Cancer Sister    Lung cancer Sister    CAD Brother 13       CABG   Alcohol abuse Brother    Hypertension Brother     Stroke Brother    CAD Brother    Cirrhosis Brother    Alcohol abuse Brother    Hypertension Brother    Cancer Brother    Lung cancer Brother    Cancer Brother    Lung cancer Brother    Migraines Other    Colon cancer Neg Hx    Colon polyps Neg Hx    Esophageal cancer Neg Hx    Rectal cancer Neg Hx    Stomach  cancer Neg Hx    Seizures Neg Hx     Allergies as of 04/10/2024 - Review Complete 04/10/2024  Allergen Reaction Noted   Nalbuphine Nausea And Vomiting, Rash, and Shortness Of Breath 10/01/2012   Nubain [nalbuphine hcl] Rash 10/01/2012    Social History   Socioeconomic History   Marital status: Married    Spouse name: Not on file   Number of children: 3   Years of education: Not on file   Highest education level: GED or equivalent  Occupational History   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  Tobacco Use   Smoking status: Never   Smokeless tobacco: Never  Vaping Use   Vaping status: Never Used  Substance and Sexual Activity   Alcohol use: No   Drug use: No   Sexual activity: Not Currently    Birth control/protection: Post-menopausal    Comment: married for 1972  Other Topics Concern   Not on file  Social History Narrative   Lives with wife    Children live nearby   Retired    1-2 cup of coffee    Social Drivers of Health   Tobacco Use: Low Risk (04/10/2024)   Patient History    Smoking Tobacco Use: Never    Smokeless Tobacco Use: Never    Passive Exposure: Not on file  Financial Resource Strain: Low Risk (03/19/2024)   Overall Financial Resource Strain (CARDIA)    Difficulty of Paying Living Expenses: Not hard at all  Food Insecurity: No Food Insecurity (03/22/2024)   Epic    Worried About Radiation Protection Practitioner of Food in the Last Year: Never true    Ran Out of Food in the Last Year: Never true  Transportation Needs: No Transportation Needs (03/22/2024)   Epic    Lack of Transportation (Medical): No    Lack of Transportation (Non-Medical): No   Physical Activity: Insufficiently Active (03/19/2024)   Exercise Vital Sign    Days of Exercise per Week: 1 day    Minutes of Exercise per Session: 20 min  Stress: No Stress Concern Present (03/19/2024)   [employer] of Occupational Health - Occupational Stress Questionnaire    Feeling of Stress: Only a little  Social Connections: Socially Integrated (03/22/2024)   Social Connection and Isolation Panel    Frequency of Communication with Friends and Family: Three times a week    Frequency of Social Gatherings with Friends and Family: Twice a week    Attends Religious Services: More than 4 times per year    Active Member of Clubs or Organizations: Yes    Attends Banker Meetings: 1 to 4 times per year    Marital Status: Married  Catering Manager Violence: Not At Risk (03/22/2024)   Epic    Fear of Current or Ex-Partner: No    Emotionally Abused: No    Physically Abused: No    Sexually Abused: No  Depression (PHQ2-9): High Risk (03/28/2024)   Depression (PHQ2-9)    PHQ-2 Score: 27  Alcohol Screen: Low Risk (03/22/2024)   Alcohol Screen    Last Alcohol Screening Score (AUDIT): 0  Housing: Low Risk (03/22/2024)   Epic    Unable to Pay for Housing in the Last Year: No    Number of Times Moved in the Last Year: 0    Homeless in the Last Year: No  Utilities: Not At Risk (03/22/2024)   Epic    Threatened with loss of utilities: No  Health Literacy:  Adequate Health Literacy (08/07/2023)   B1300 Health Literacy    Frequency of need for help with medical instructions: Never    Review of Systems   Gen: Denies any fever, chills, fatigue, weight loss, lack of appetite.  CV: Denies chest pain, heart palpitations, peripheral edema, syncope.  Resp: Denies shortness of breath at rest or with exertion. Denies wheezing or cough.  GI: see HPI GU : Denies urinary burning, urinary frequency, urinary hesitancy MS: + chronic back pain. Denies  muscle weakness, cramps, or limitation of  movement.  Psych: + anxiety/depression. Denies memory loss, and confusion Heme: Denies bruising, bleeding, and enlarged lymph nodes.  Physical Exam   BP 138/84 (BP Location: Right Arm, Patient Position: Sitting, Cuff Size: Large)   Pulse 79   Temp 98.5 F (36.9 C) (Temporal)   Ht 6' 1 (1.854 m)   Wt 252 lb 12.8 oz (114.7 kg)   BMI 33.35 kg/m   General:   Alert and oriented. Pleasant and cooperative. Well-nourished and well-developed.  Head:  Normocephalic and atraumatic. Eyes:  Without icterus, sclera clear and conjunctiva pink.  Ears:  Normal auditory acuity. Mouth:  No deformity or lesions, oral mucosa pink.  Lungs:  Clear to auscultation bilaterally. No wheezes, rales, or rhonchi. No distress.  Heart:  S1, S2 present without murmurs appreciated.  Abdomen:  +BS, soft, non-tender and non-distended. No HSM noted. No guarding or rebound. No masses appreciated. Morphine pump noted and palpated to RLQ.  Rectal:  deferred Msk:  Symmetrical without gross deformities. Normal posture. Extremities:  Without edema. Neurologic:  Alert and  oriented x4;  grossly normal neurologically. Skin:  Intact without significant lesions or rashes. Psych:  Alert and cooperative. Normal mood and affect.  Assessment & Plan   Jeff Stewart is a 69 y.o. male with a history of sleep apnea, HTN, GERD, dyslipidemia, depression/anxiety, chronic back pain with morphine pump for years, and hypogonadism (low testosterone ) on replacement therapy presenting today for evaluation of MASLD.     Metabolic associated steatotic liver disease, elevated ELF Intermediate risk for mild fibrosis based on non-invasive markers; liver enzymes are within normal limits. No alcohol use or family history of liver disease. Etiology likely related to metabolic risk factors and possible medication effects. The primary goal is to prevent progression to cirrhosis and monitor for changes, with future pharmacologic options discussed if  fibrosis advances. - Ordered RUQ US  with elastography for assessment of hepatomegaly and baseline stiffness.  - Provided Mediterranean diet handout and recommended dietary modifications to aid in fibrosis reversal. - Documented information regarding medication options - Rezdiffra vs Wegovy. - Planned repeat ELF and SAFE scores in six months, or earlier if clinically indicated. - Advised to report symptoms such as weight loss, changes in bowel habits, or other concerning features. - If liver enzymes or fibrosis markers worsen, will consider earlier laboratory evaluation and possible initiation of pharmacologic therapy.  Chronic constipation Chronic constipation is likely medication-induced and currently well-controlled with daily Metamucil 2 tablespoons. No alarming symptoms present. - Continue daily Metamucil at current dose. - Advised to contact if constipation worsens for further management.  Gastroesophageal reflux disease, post-fundoplication GERD is well-managed post-fundoplication with no current reflux symptoms. Pantoprazole  is used as needed for occasional gastric discomfort. - Continue pantoprazole  40 mg daily as needed for occasional gastric discomfort. - Advised to report any new or worsening reflux or abdominal symptoms.      Follow up   Follow up 6 months, sooner if needed pending imaging  and labs.    Charmaine Melia, MSN, FNP-BC, AGACNP-BC [facility] "

## 2024-04-09 NOTE — Progress Notes (Unsigned)
" ° °  THERAPIST PROGRESS NOTE  Session Time: 04/01/2024 2:00 PM - 3:00 PM  Participation Level: Active  Behavioral Response: Neat, Alert, Irritable  Type of Therapy: Individual Therapy  Treatment Goals addressed: The treatment plan focuses on addressing two primary goals: reducing symptoms of anxiety and improving sleep quality. Through targeted interventions, the patient will work with the therapist to learn and implement effective anxiety management techniques, such as the STOP method and diaphragmatic breathing, while establishing healthier sleep routines through yoga and sleep hygiene. These efforts aim to decrease anxiety levels and enhance overall functioning by promoting better sleep patterns and emotional well-being over the course of the 3-month treatment period.  ProgressTowards Goals: Initial  Interventions: CBT, Solution Focused, and Other: Mindfulness Based Stress Reduction Techniques  Summary: Jeff Stewart is a 69 y.o. male who presents with excessive worry, tension, restlessness, difficulty concentrating, fatigue, irritability, and significant sleep disturbance, all of which are impacting his daily functioning. He demonstrated openness to trying new strategies discussed during the session.  We explored several approaches for symptom management, including the STOP method with diaphragmatic breathing, meditative walking, and yoga poses (Bridge, Corpse, and [employer]) for relaxation and sleep support. I provided instructions for each technique and emphasized the importance of consulting his physician before starting new exercises or herbal teas (chamomile, valerian root, lavender) due to possible health considerations. We also discussed celebrating his progress through a simple acknowledgment and reward process to reinforce positive changes.  Jeff Stewart was receptive to these interventions and expressed readiness to incorporate them into his routine. I will continue to support and monitor  his progress, making adjustments to the treatment plan as needed to help him achieve his goals of reduced anxiety and improved sleep..   Suicidal/Homicidal: No, without intent/plan  Therapist Response: I acknowledged [name] openness and motivation to try new strategies for managing his symptoms. I provided education and step-by-step guidance on several techniques, including the STOP method, diaphragmatic breathing, meditative walking, and yoga poses for relaxation. I reinforced the importance of safety by advising him to consult with his physician before starting any new exercise or herbal tea regimen. Additionally, I encouraged him to celebrate his progress using a simple two-step process to reinforce positive changes. I expressed support for his efforts and will continue to monitor his response to these interventions, adjusting the treatment plan as needed to best support his goals for anxiety reduction and improved sleep.  Plan: Return again in 2 weeks.  Diagnosis: Current moderate episode of major depressive disorder without prior episode (HCC)  Generalized anxiety disorder  Collaboration of Care: Other None.  Patient/Guardian was advised Release of Information must be obtained prior to any record release in order to collaborate their care with an outside provider. Patient/Guardian was advised if they have not already done so to contact the registration department to sign all necessary forms in order for us  to release information regarding their care.   Consent: Patient/Guardian gives verbal consent for treatment and assignment of benefits for services provided during this visit. Patient/Guardian expressed understanding and agreed to proceed.   Kimberli Winne 04/09/2024  "

## 2024-04-10 ENCOUNTER — Ambulatory Visit: Admitting: Gastroenterology

## 2024-04-10 ENCOUNTER — Encounter: Payer: Self-pay | Admitting: *Deleted

## 2024-04-10 ENCOUNTER — Encounter: Payer: Self-pay | Admitting: Gastroenterology

## 2024-04-10 VITALS — BP 138/84 | HR 79 | Temp 98.5°F | Ht 73.0 in | Wt 252.8 lb

## 2024-04-10 DIAGNOSIS — K760 Fatty (change of) liver, not elsewhere classified: Secondary | ICD-10-CM | POA: Diagnosis not present

## 2024-04-10 DIAGNOSIS — K219 Gastro-esophageal reflux disease without esophagitis: Secondary | ICD-10-CM

## 2024-04-10 DIAGNOSIS — K5909 Other constipation: Secondary | ICD-10-CM

## 2024-04-10 DIAGNOSIS — K5900 Constipation, unspecified: Secondary | ICD-10-CM

## 2024-04-10 NOTE — Patient Instructions (Addendum)
 Instructions for fatty liver: Recommend 1-2# weight loss per week until ideal body weight through exercise & diet. Low fat/cholesterol diet.   Avoid sweets, sodas, fruit juices, sweetened beverages like tea, etc. Gradually increase exercise from 15 min daily up to 1 hr per day 5 days/week. Limit alcohol use.  I have attached a mediterranean diet for you.  We will get you scheduled for US  in the near future and repeat labs in 6 months. If ELF still elevated then need to consider Rezdiffra (medication to reverse liver fibrosis) or Wegovy.   If you have a rise in liver enzymes between now and 6 months then we will repeat additional labs at that time and possibly consider therapy sooner.   It was a pleasure to see you today. I want to create trusting relationships with patients. If you receive a survey regarding your visit,  I greatly appreciate you taking time to fill this out on paper or through your MyChart. I value your feedback.  Charmaine Melia, MSN, FNP-BC, AGACNP-BC [facility]

## 2024-04-12 ENCOUNTER — Ambulatory Visit (HOSPITAL_COMMUNITY)
Admission: RE | Admit: 2024-04-12 | Discharge: 2024-04-12 | Disposition: A | Source: Ambulatory Visit | Attending: Gastroenterology

## 2024-04-12 DIAGNOSIS — K760 Fatty (change of) liver, not elsewhere classified: Secondary | ICD-10-CM | POA: Diagnosis present

## 2024-04-13 NOTE — Addendum Note (Signed)
 Addended by: CHALICE SAUNAS on: 04/13/2024 04:18 PM   Modules accepted: Orders

## 2024-04-15 ENCOUNTER — Ambulatory Visit: Payer: Self-pay | Admitting: Gastroenterology

## 2024-04-16 ENCOUNTER — Telehealth: Payer: Self-pay | Admitting: Neurology

## 2024-04-16 NOTE — Telephone Encounter (Signed)
 Referral for physical medicine rehab sent through [facility] to [facility]. [phone], Fax: [phone]

## 2024-04-17 NOTE — Telephone Encounter (Signed)
 Referral sent to [facility] for Fibroscabnff

## 2024-04-18 ENCOUNTER — Encounter: Payer: Self-pay | Admitting: Family Medicine

## 2024-04-18 ENCOUNTER — Ambulatory Visit: Admitting: Family Medicine

## 2024-04-18 VITALS — BP 125/75 | HR 85 | Ht 73.0 in | Wt 256.0 lb

## 2024-04-18 DIAGNOSIS — F419 Anxiety disorder, unspecified: Secondary | ICD-10-CM | POA: Diagnosis not present

## 2024-04-18 DIAGNOSIS — F332 Major depressive disorder, recurrent severe without psychotic features: Secondary | ICD-10-CM

## 2024-04-18 DIAGNOSIS — F322 Major depressive disorder, single episode, severe without psychotic features: Secondary | ICD-10-CM

## 2024-04-18 NOTE — Progress Notes (Signed)
 "  BP 125/75   Pulse 85   Ht 6' 1 (1.854 m)   Wt 256 lb (116.1 kg)   SpO2 95%   BMI 33.78 kg/m    Subjective:   Patient ID: Jeff Stewart, male    DOB: [birth year], 69 y.o.   MRN: [id number]  HPI: Jeff Stewart is a 69 y.o. male presenting on 04/18/2024 for Medical Management of Chronic Issues, Depression, and Insomnia (All has improved since last visit.)   Discussed the use of AI scribe software for clinical note transcription with the patient, who gave verbal consent to proceed.  History of Present Illness   Jeff Stewart is a 69 year old male who presents with mood disturbances and sleep issues.  Mood disturbances - Irritability and stress present, sometimes leading to feelings of depression and 'bad thoughts' - Symptoms have improved over the last 2-3 days - No thoughts of self-harm or suicide - Episodes of significant fatigue alternating with periods of increased energy, contributing to mood fluctuations and depressive symptoms  Sleep disturbances - Sleep has improved since starting Seroquel  - Initial morning grogginess when taking both Seroquel  doses at night - Adjusting Seroquel  dosing to one dose at 6 PM and the other an hour before bedtime has alleviated morning grogginess  Psychotropic medication use - Seroquel  taken in two evening doses, spaced out to reduce side effects - Ativan  taken three times daily - Zoloft  and Earma taken three times daily, reported to help with mood and anxiety          Relevant past medical, surgical, family and social history reviewed and updated as indicated. Interim medical history since our last visit reviewed. Allergies and medications reviewed and updated.  Review of Systems  Constitutional:  Negative for chills and fever.  Eyes:  Negative for visual disturbance.  Respiratory:  Negative for shortness of breath and wheezing.   Cardiovascular:  Negative for chest pain and leg swelling.  Musculoskeletal:  Positive for  back pain. Negative for gait problem.  Skin:  Negative for rash.  Psychiatric/Behavioral:  Positive for decreased concentration, dysphoric mood and sleep disturbance. Negative for self-injury and suicidal ideas. The patient is nervous/anxious.   All other systems reviewed and are negative.   Per HPI unless specifically indicated above   Allergies as of 04/18/2024       Reactions   Nalbuphine Shortness Of Breath, Nausea And Vomiting, Rash   nalbuphine        Medication List        Accurate as of April 18, 2024  3:21 PM. If you have any questions, ask your nurse or doctor.          acetaminophen  500 MG tablet Commonly known as: TYLENOL  Take 500 mg by mouth every 6 (six) hours as needed.   fluticasone  50 MCG/ACT nasal spray Commonly known as: FLONASE  Place 2 sprays into both nostrils daily.   hydrochlorothiazide  25 MG tablet Commonly known as: HYDRODIURIL  Take 1 tablet (25 mg total) by mouth daily. TAKE 1 TABLET DAILY.   LORazepam  0.5 MG tablet Commonly known as: ATIVAN  Take 1 tablet (0.5 mg total) by mouth 3 (three) times daily.   losartan  50 MG tablet Commonly known as: COZAAR  Take 1 tablet (50 mg total) by mouth 2 (two) times daily.   ondansetron  4 MG disintegrating tablet Commonly known as: ZOFRAN -ODT Take 1 tablet (4 mg total) by mouth every 8 (eight) hours as needed for nausea or vomiting.   pantoprazole  40 MG  tablet Commonly known as: PROTONIX  Take 1 tablet (40 mg total) by mouth daily.   QUEtiapine  50 MG tablet Commonly known as: SEROQUEL  Take 2 tablets (100 mg total) by mouth at bedtime.   sertraline  50 MG tablet Commonly known as: ZOLOFT  Take 3 tablets (150 mg total) by mouth at bedtime.   SYRINGE 3CC/21GX1-1/4 21G X 1-1/4 3 ML Misc 1 each by Does not apply route once a week.   testosterone  cypionate 200 MG/ML injection Commonly known as: DEPOTESTOSTERONE CYPIONATE INJECT 0.5 MLS EVERY 10 DAYS   Vitamin D  50 MCG (2000 UT) Caps Take  2,000 Units by mouth daily with lunch.         Objective:   BP 125/75   Pulse 85   Ht 6' 1 (1.854 m)   Wt 256 lb (116.1 kg)   SpO2 95%   BMI 33.78 kg/m   Wt Readings from Last 3 Encounters:  04/18/24 256 lb (116.1 kg)  04/10/24 252 lb 12.8 oz (114.7 kg)  04/04/24 246 lb (111.6 kg)    Physical Exam Physical Exam   CHEST: Lungs clear to auscultation. CARDIOVASCULAR: Regular heart sounds.         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 Persistent irritability and stress, occasional depressive thoughts, no suicidal ideation. Neuropsychiatry appointment scheduled for further evaluation of PTSD, bipolar disorder, or severe depression. Current medications aiding mood and anxiety. - Continue Zoloft , Seroquel , Ativan , and Xeljanz as prescribed. - Attend neuropsychiatry appointment for further evaluation and potential diagnosis. - Adjust timing of Seroquel  to minimize morning grogginess.          Follow up plan: Return in about 4 weeks (around 05/16/2024), or if symptoms worsen or fail to improve, for Depression and insomnia recheck.  Counseling provided for all of the vaccine components No orders of the defined types were placed in this encounter.   Fonda Levins, MD [facility] 04/18/2024, 3:21 PM     "

## 2024-04-25 ENCOUNTER — Ambulatory Visit: Admitting: Family Medicine

## 2024-05-16 ENCOUNTER — Ambulatory Visit: Admitting: Family Medicine

## 2024-05-27 ENCOUNTER — Ambulatory Visit (HOSPITAL_COMMUNITY): Admitting: Student in an Organized Health Care Education/Training Program

## 2024-05-30 ENCOUNTER — Ambulatory Visit: Admitting: "Endocrinology

## 2024-08-08 ENCOUNTER — Encounter

## 2024-10-29 ENCOUNTER — Ambulatory Visit: Admitting: Neurology

## 2024-11-04 ENCOUNTER — Ambulatory Visit: Admitting: Neurology
